# Patient Record
Sex: Male | Born: 1950 | Race: White | Hispanic: No | Marital: Married | Smoking: Former smoker
Health system: Southern US, Community
[De-identification: ages and names within clinical notes are randomized; demographics above are authoritative.]

## PROBLEM LIST (undated history)

## (undated) DIAGNOSIS — G43909 Migraine, unspecified, not intractable, without status migrainosus: Secondary | ICD-10-CM

## (undated) DIAGNOSIS — F32A Depression, unspecified: Secondary | ICD-10-CM

## (undated) DIAGNOSIS — R0602 Shortness of breath: Secondary | ICD-10-CM

## (undated) DIAGNOSIS — I739 Peripheral vascular disease, unspecified: Secondary | ICD-10-CM

## (undated) DIAGNOSIS — G609 Hereditary and idiopathic neuropathy, unspecified: Secondary | ICD-10-CM

## (undated) DIAGNOSIS — E785 Hyperlipidemia, unspecified: Secondary | ICD-10-CM

## (undated) DIAGNOSIS — Z89519 Acquired absence of unspecified leg below knee: Secondary | ICD-10-CM

## (undated) DIAGNOSIS — K219 Gastro-esophageal reflux disease without esophagitis: Secondary | ICD-10-CM

## (undated) DIAGNOSIS — N281 Cyst of kidney, acquired: Secondary | ICD-10-CM

## (undated) DIAGNOSIS — J189 Pneumonia, unspecified organism: Secondary | ICD-10-CM

## (undated) DIAGNOSIS — Z992 Dependence on renal dialysis: Secondary | ICD-10-CM

## (undated) DIAGNOSIS — K5730 Diverticulosis of large intestine without perforation or abscess without bleeding: Secondary | ICD-10-CM

## (undated) DIAGNOSIS — I350 Nonrheumatic aortic (valve) stenosis: Secondary | ICD-10-CM

## (undated) DIAGNOSIS — K648 Other hemorrhoids: Secondary | ICD-10-CM

## (undated) DIAGNOSIS — I2510 Atherosclerotic heart disease of native coronary artery without angina pectoris: Secondary | ICD-10-CM

## (undated) DIAGNOSIS — I5022 Chronic systolic (congestive) heart failure: Secondary | ICD-10-CM

## (undated) DIAGNOSIS — M62830 Muscle spasm of back: Secondary | ICD-10-CM

## (undated) DIAGNOSIS — M109 Gout, unspecified: Secondary | ICD-10-CM

## (undated) DIAGNOSIS — N186 End stage renal disease: Secondary | ICD-10-CM

## (undated) DIAGNOSIS — I255 Ischemic cardiomyopathy: Secondary | ICD-10-CM

## (undated) DIAGNOSIS — I4892 Unspecified atrial flutter: Secondary | ICD-10-CM

## (undated) DIAGNOSIS — I471 Supraventricular tachycardia: Secondary | ICD-10-CM

## (undated) DIAGNOSIS — F329 Major depressive disorder, single episode, unspecified: Secondary | ICD-10-CM

## (undated) DIAGNOSIS — L02212 Cutaneous abscess of back [any part, except buttock]: Secondary | ICD-10-CM

## (undated) DIAGNOSIS — K3184 Gastroparesis: Secondary | ICD-10-CM

## (undated) DIAGNOSIS — J449 Chronic obstructive pulmonary disease, unspecified: Secondary | ICD-10-CM

## (undated) DIAGNOSIS — I4891 Unspecified atrial fibrillation: Secondary | ICD-10-CM

## (undated) DIAGNOSIS — IMO0002 Reserved for concepts with insufficient information to code with codable children: Secondary | ICD-10-CM

## (undated) DIAGNOSIS — D126 Benign neoplasm of colon, unspecified: Secondary | ICD-10-CM

## (undated) DIAGNOSIS — I441 Atrioventricular block, second degree: Secondary | ICD-10-CM

## (undated) DIAGNOSIS — C4491 Basal cell carcinoma of skin, unspecified: Secondary | ICD-10-CM

## (undated) DIAGNOSIS — Z87442 Personal history of urinary calculi: Secondary | ICD-10-CM

## (undated) DIAGNOSIS — E119 Type 2 diabetes mellitus without complications: Secondary | ICD-10-CM

## (undated) DIAGNOSIS — F528 Other sexual dysfunction not due to a substance or known physiological condition: Secondary | ICD-10-CM

## (undated) DIAGNOSIS — I10 Essential (primary) hypertension: Secondary | ICD-10-CM

## (undated) DIAGNOSIS — G709 Myoneural disorder, unspecified: Secondary | ICD-10-CM

## (undated) HISTORY — DX: Cyst of kidney, acquired: N28.1

## (undated) HISTORY — DX: Gastro-esophageal reflux disease without esophagitis: K21.9

## (undated) HISTORY — PX: SKIN GRAFT: SHX250

## (undated) HISTORY — DX: Cutaneous abscess of back (any part, except buttock): L02.212

## (undated) HISTORY — DX: Dependence on renal dialysis: Z99.2

## (undated) HISTORY — DX: Myoneural disorder, unspecified: G70.9

## (undated) HISTORY — DX: Essential (primary) hypertension: I10

## (undated) HISTORY — DX: Atrioventricular block, second degree: I44.1

## (undated) HISTORY — PX: COLONOSCOPY: SHX174

## (undated) HISTORY — DX: Unspecified atrial flutter: I48.92

## (undated) HISTORY — PX: FRACTURE SURGERY: SHX138

## (undated) HISTORY — PX: ATRIAL ABLATION SURGERY: SHX560

## (undated) HISTORY — DX: Unspecified atrial fibrillation: I48.91

## (undated) HISTORY — DX: Gout, unspecified: M10.9

## (undated) HISTORY — PX: VASECTOMY: SHX75

## (undated) HISTORY — DX: End stage renal disease: N18.6

## (undated) HISTORY — DX: Hereditary and idiopathic neuropathy, unspecified: G60.9

## (undated) HISTORY — DX: Gastroparesis: K31.84

## (undated) HISTORY — DX: Atherosclerotic heart disease of native coronary artery without angina pectoris: I25.10

## (undated) HISTORY — DX: Supraventricular tachycardia: I47.1

## (undated) HISTORY — DX: Benign neoplasm of colon, unspecified: D12.6

## (undated) HISTORY — DX: Diverticulosis of large intestine without perforation or abscess without bleeding: K57.30

## (undated) HISTORY — DX: Chronic obstructive pulmonary disease, unspecified: J44.9

## (undated) HISTORY — DX: Hyperlipidemia, unspecified: E78.5

## (undated) HISTORY — DX: Other sexual dysfunction not due to a substance or known physiological condition: F52.8

## (undated) HISTORY — PX: TONSILLECTOMY: SUR1361

## (undated) HISTORY — PX: CATARACT EXTRACTION: SUR2

## (undated) HISTORY — DX: Personal history of urinary calculi: Z87.442

## (undated) HISTORY — DX: Other hemorrhoids: K64.8

## (undated) HISTORY — DX: Muscle spasm of back: M62.830

## (undated) HISTORY — DX: Chronic systolic (congestive) heart failure: I50.22

## (undated) HISTORY — DX: Reserved for concepts with insufficient information to code with codable children: IMO0002

---
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
## 1972-06-14 HISTORY — PX: ELBOW FRACTURE SURGERY: SHX616

## 1998-08-10 ENCOUNTER — Encounter: Payer: Self-pay | Admitting: *Deleted

## 1998-08-10 ENCOUNTER — Emergency Department (HOSPITAL_COMMUNITY): Admission: EM | Admit: 1998-08-10 | Discharge: 1998-08-10 | Payer: Self-pay | Admitting: *Deleted

## 2000-10-31 ENCOUNTER — Encounter: Admission: RE | Admit: 2000-10-31 | Discharge: 2001-01-29 | Payer: Self-pay | Admitting: Endocrinology

## 2000-12-14 ENCOUNTER — Encounter: Payer: Self-pay | Admitting: Family Medicine

## 2000-12-14 ENCOUNTER — Encounter (INDEPENDENT_AMBULATORY_CARE_PROVIDER_SITE_OTHER): Payer: Self-pay | Admitting: *Deleted

## 2000-12-14 ENCOUNTER — Encounter: Admission: RE | Admit: 2000-12-14 | Discharge: 2000-12-14 | Payer: Self-pay | Admitting: Family Medicine

## 2001-02-01 ENCOUNTER — Ambulatory Visit (HOSPITAL_COMMUNITY): Admission: RE | Admit: 2001-02-01 | Discharge: 2001-02-01 | Payer: Self-pay | Admitting: Gastroenterology

## 2001-02-01 ENCOUNTER — Encounter: Payer: Self-pay | Admitting: Gastroenterology

## 2001-02-01 ENCOUNTER — Encounter (INDEPENDENT_AMBULATORY_CARE_PROVIDER_SITE_OTHER): Payer: Self-pay | Admitting: Specialist

## 2001-02-06 ENCOUNTER — Ambulatory Visit (HOSPITAL_COMMUNITY): Admission: RE | Admit: 2001-02-06 | Discharge: 2001-02-06 | Payer: Self-pay | Admitting: Gastroenterology

## 2001-02-06 ENCOUNTER — Encounter: Payer: Self-pay | Admitting: Gastroenterology

## 2001-02-17 ENCOUNTER — Encounter: Payer: Self-pay | Admitting: Gastroenterology

## 2001-02-17 ENCOUNTER — Ambulatory Visit (HOSPITAL_COMMUNITY): Admission: RE | Admit: 2001-02-17 | Discharge: 2001-02-17 | Payer: Self-pay | Admitting: Gastroenterology

## 2003-06-11 ENCOUNTER — Emergency Department (HOSPITAL_COMMUNITY): Admission: EM | Admit: 2003-06-11 | Discharge: 2003-06-11 | Payer: Self-pay | Admitting: Emergency Medicine

## 2004-02-25 ENCOUNTER — Encounter (HOSPITAL_BASED_OUTPATIENT_CLINIC_OR_DEPARTMENT_OTHER): Admission: RE | Admit: 2004-02-25 | Discharge: 2004-03-10 | Payer: Self-pay | Admitting: Internal Medicine

## 2006-03-28 ENCOUNTER — Ambulatory Visit: Payer: Self-pay | Admitting: Internal Medicine

## 2006-04-20 ENCOUNTER — Ambulatory Visit: Payer: Self-pay | Admitting: Internal Medicine

## 2006-04-27 ENCOUNTER — Ambulatory Visit: Payer: Self-pay | Admitting: Internal Medicine

## 2006-04-27 ENCOUNTER — Ambulatory Visit (HOSPITAL_COMMUNITY): Admission: RE | Admit: 2006-04-27 | Discharge: 2006-04-28 | Payer: Self-pay | Admitting: Internal Medicine

## 2006-06-01 ENCOUNTER — Ambulatory Visit: Payer: Self-pay | Admitting: Internal Medicine

## 2006-09-20 ENCOUNTER — Encounter: Admission: RE | Admit: 2006-09-20 | Discharge: 2006-12-19 | Payer: Self-pay | Admitting: Endocrinology

## 2007-01-31 ENCOUNTER — Ambulatory Visit: Payer: Self-pay | Admitting: Internal Medicine

## 2007-01-31 ENCOUNTER — Inpatient Hospital Stay (HOSPITAL_COMMUNITY): Admission: AD | Admit: 2007-01-31 | Discharge: 2007-02-02 | Payer: Self-pay | Admitting: Internal Medicine

## 2007-02-01 ENCOUNTER — Encounter: Payer: Self-pay | Admitting: Internal Medicine

## 2007-02-06 ENCOUNTER — Ambulatory Visit: Payer: Self-pay | Admitting: Cardiology

## 2007-02-07 ENCOUNTER — Encounter (INDEPENDENT_AMBULATORY_CARE_PROVIDER_SITE_OTHER): Payer: Self-pay | Admitting: *Deleted

## 2007-02-07 ENCOUNTER — Ambulatory Visit (HOSPITAL_COMMUNITY): Admission: RE | Admit: 2007-02-07 | Discharge: 2007-02-07 | Payer: Self-pay | Admitting: *Deleted

## 2007-02-07 ENCOUNTER — Ambulatory Visit: Payer: Self-pay | Admitting: Cardiology

## 2007-02-07 LAB — CONVERTED CEMR LAB
Ketones, ur: NEGATIVE mg/dL
Mucus, UA: NEGATIVE
Nitrite: POSITIVE — AB
Specific Gravity, Urine: 1.025 (ref 1.000–1.03)
Squamous Epithelial / HPF: NEGATIVE /lpf
Total Protein, Urine: 100 mg/dL — AB
Urine Glucose: >=1000 mg/dL — CR
Urobilinogen, UA: 0.2 (ref 0.0–1.0)
pH: 5 (ref 5.0–8.0)

## 2007-02-08 ENCOUNTER — Ambulatory Visit: Payer: Self-pay | Admitting: Cardiology

## 2007-02-16 ENCOUNTER — Ambulatory Visit: Payer: Self-pay | Admitting: Cardiology

## 2007-02-23 ENCOUNTER — Ambulatory Visit: Payer: Self-pay | Admitting: Cardiology

## 2007-03-02 ENCOUNTER — Ambulatory Visit: Payer: Self-pay | Admitting: Cardiology

## 2007-03-13 ENCOUNTER — Ambulatory Visit: Payer: Self-pay | Admitting: Internal Medicine

## 2007-03-13 LAB — CONVERTED CEMR LAB
BUN: 23 mg/dL (ref 6–23)
Basophils Relative: 2 % — ABNORMAL HIGH (ref 0.0–1.0)
CO2: 25 meq/L (ref 19–32)
Calcium: 9.8 mg/dL (ref 8.4–10.5)
Chloride: 104 meq/L (ref 96–112)
Creatinine, Ser: 1.3 mg/dL (ref 0.4–1.5)
Eosinophils Relative: 4 % (ref 0.0–5.0)
GFR calc Af Amer: 73 mL/min
GFR calc non Af Amer: 61 mL/min
Glucose, Bld: 289 mg/dL — ABNORMAL HIGH (ref 70–99)
HCT: 35.6 % — ABNORMAL LOW (ref 39.0–52.0)
Hemoglobin: 12.7 g/dL — ABNORMAL LOW (ref 13.0–17.0)
INR: 2.3 — ABNORMAL HIGH (ref 0.8–1.0)
Lymphocytes Relative: 38 % (ref 12.0–46.0)
MCHC: 35.5 g/dL (ref 30.0–36.0)
MCV: 85.6 fL (ref 78.0–100.0)
Monocytes Relative: 4 % (ref 3.0–11.0)
Neutrophils Relative %: 50 % (ref 43.0–77.0)
Platelets: 404 10*3/uL — ABNORMAL HIGH (ref 150–400)
Potassium: 4.7 meq/L (ref 3.5–5.1)
Prothrombin Time: 19.2 s — ABNORMAL HIGH (ref 10.9–13.3)
RBC: 4.16 M/uL — ABNORMAL LOW (ref 4.22–5.81)
RDW: 14.2 % (ref 11.5–14.6)
Sodium: 138 meq/L (ref 135–145)
WBC: 9.2 10*3/uL (ref 4.5–10.5)
aPTT: 41.3 s — ABNORMAL HIGH (ref 21.7–29.8)

## 2007-03-17 ENCOUNTER — Ambulatory Visit: Payer: Self-pay | Admitting: Internal Medicine

## 2007-03-17 ENCOUNTER — Ambulatory Visit: Admission: RE | Admit: 2007-03-17 | Discharge: 2007-03-17 | Payer: Self-pay | Admitting: Internal Medicine

## 2007-03-29 ENCOUNTER — Ambulatory Visit: Payer: Self-pay | Admitting: Cardiovascular Disease

## 2007-04-06 ENCOUNTER — Ambulatory Visit: Payer: Self-pay | Admitting: Cardiology

## 2007-04-13 ENCOUNTER — Ambulatory Visit: Payer: Self-pay | Admitting: Internal Medicine

## 2007-04-21 ENCOUNTER — Ambulatory Visit: Payer: Self-pay | Admitting: Cardiology

## 2007-04-25 ENCOUNTER — Encounter: Payer: Self-pay | Admitting: Gastroenterology

## 2007-04-25 ENCOUNTER — Encounter: Admission: RE | Admit: 2007-04-25 | Discharge: 2007-04-25 | Payer: Self-pay | Admitting: Endocrinology

## 2007-04-27 ENCOUNTER — Ambulatory Visit: Payer: Self-pay | Admitting: Cardiology

## 2007-05-05 ENCOUNTER — Ambulatory Visit: Payer: Self-pay | Admitting: Internal Medicine

## 2007-05-15 ENCOUNTER — Ambulatory Visit: Payer: Self-pay | Admitting: Internal Medicine

## 2007-05-15 LAB — CONVERTED CEMR LAB
BUN: 30 mg/dL — ABNORMAL HIGH (ref 6–23)
Basophils Relative: 0 % (ref 0.0–1.0)
CO2: 23 meq/L (ref 19–32)
Calcium: 9.3 mg/dL (ref 8.4–10.5)
Chloride: 100 meq/L (ref 96–112)
Creatinine, Ser: 1.1 mg/dL (ref 0.4–1.5)
Eosinophils Relative: 6 % — ABNORMAL HIGH (ref 0.0–5.0)
GFR calc Af Amer: 89 mL/min
GFR calc non Af Amer: 74 mL/min
Glucose, Bld: 259 mg/dL — ABNORMAL HIGH (ref 70–99)
HCT: 34.1 % — ABNORMAL LOW (ref 39.0–52.0)
Hemoglobin: 14 g/dL (ref 13.0–17.0)
Lymphocytes Relative: 32 % (ref 12.0–46.0)
MCHC: 41 g/dL (ref 30.0–36.0)
MCV: 84.7 fL (ref 78.0–100.0)
Monocytes Relative: 4 % (ref 3.0–11.0)
Neutrophils Relative %: 58 % (ref 43.0–77.0)
Platelets: 482 10*3/uL — ABNORMAL HIGH (ref 150–400)
Potassium: 4.9 meq/L (ref 3.5–5.1)
RBC: 4.02 M/uL — ABNORMAL LOW (ref 4.22–5.81)
RDW: 13.7 % (ref 11.5–14.6)
Sodium: 131 meq/L — ABNORMAL LOW (ref 135–145)
WBC: 11.1 10*3/uL — ABNORMAL HIGH (ref 4.5–10.5)

## 2007-05-18 ENCOUNTER — Encounter: Payer: Self-pay | Admitting: Internal Medicine

## 2007-05-18 ENCOUNTER — Ambulatory Visit (HOSPITAL_COMMUNITY): Admission: RE | Admit: 2007-05-18 | Discharge: 2007-05-19 | Payer: Self-pay | Admitting: Internal Medicine

## 2007-05-18 ENCOUNTER — Ambulatory Visit: Payer: Self-pay | Admitting: Internal Medicine

## 2007-05-25 ENCOUNTER — Ambulatory Visit: Payer: Self-pay | Admitting: Cardiology

## 2007-06-16 ENCOUNTER — Ambulatory Visit: Payer: Self-pay | Admitting: Cardiology

## 2007-06-19 ENCOUNTER — Ambulatory Visit: Payer: Self-pay | Admitting: Internal Medicine

## 2007-06-19 LAB — CONVERTED CEMR LAB
BUN: 22 mg/dL (ref 6–23)
CO2: 23 meq/L (ref 19–32)
Calcium: 9.5 mg/dL (ref 8.4–10.5)
Chloride: 99 meq/L (ref 96–112)
Creatinine, Ser: 1.2 mg/dL (ref 0.4–1.5)
GFR calc Af Amer: 81 mL/min
GFR calc non Af Amer: 67 mL/min
Glucose, Bld: 363 mg/dL — ABNORMAL HIGH (ref 70–99)
Potassium: 5 meq/L (ref 3.5–5.1)
Pro B Natriuretic peptide (BNP): 55 pg/mL (ref 0.0–100.0)
Sodium: 131 meq/L — ABNORMAL LOW (ref 135–145)

## 2007-06-22 ENCOUNTER — Ambulatory Visit: Payer: Self-pay | Admitting: Internal Medicine

## 2007-06-23 ENCOUNTER — Ambulatory Visit: Payer: Self-pay | Admitting: Cardiology

## 2007-06-23 ENCOUNTER — Inpatient Hospital Stay (HOSPITAL_BASED_OUTPATIENT_CLINIC_OR_DEPARTMENT_OTHER): Admission: RE | Admit: 2007-06-23 | Discharge: 2007-06-23 | Payer: Self-pay | Admitting: Cardiovascular Disease

## 2007-07-20 ENCOUNTER — Encounter: Admission: RE | Admit: 2007-07-20 | Discharge: 2007-07-20 | Payer: Self-pay | Admitting: Endocrinology

## 2007-08-08 ENCOUNTER — Ambulatory Visit: Payer: Self-pay | Admitting: Cardiology

## 2007-08-17 ENCOUNTER — Ambulatory Visit: Payer: Self-pay | Admitting: Cardiovascular Disease

## 2007-09-21 ENCOUNTER — Ambulatory Visit: Payer: Self-pay | Admitting: Internal Medicine

## 2007-10-12 ENCOUNTER — Ambulatory Visit: Payer: Self-pay | Admitting: Cardiology

## 2007-11-08 ENCOUNTER — Ambulatory Visit: Payer: Self-pay | Admitting: Cardiology

## 2007-11-27 ENCOUNTER — Ambulatory Visit: Payer: Self-pay | Admitting: Internal Medicine

## 2007-12-04 ENCOUNTER — Ambulatory Visit: Payer: Self-pay | Admitting: Internal Medicine

## 2008-01-03 ENCOUNTER — Ambulatory Visit: Payer: Self-pay | Admitting: Cardiology

## 2008-01-24 ENCOUNTER — Encounter: Payer: Self-pay | Admitting: Internal Medicine

## 2008-01-25 ENCOUNTER — Ambulatory Visit: Payer: Self-pay | Admitting: Internal Medicine

## 2008-05-14 ENCOUNTER — Encounter: Admission: RE | Admit: 2008-05-14 | Discharge: 2008-05-14 | Payer: Self-pay | Admitting: Family Medicine

## 2008-05-14 ENCOUNTER — Encounter: Payer: Self-pay | Admitting: Internal Medicine

## 2008-05-15 ENCOUNTER — Ambulatory Visit: Payer: Self-pay | Admitting: Internal Medicine

## 2008-06-14 HISTORY — PX: AV FISTULA PLACEMENT: SHX1204

## 2008-06-14 LAB — HM COLONOSCOPY: HM Colonoscopy: NORMAL

## 2008-06-17 ENCOUNTER — Ambulatory Visit: Payer: Self-pay | Admitting: Internal Medicine

## 2008-06-24 ENCOUNTER — Encounter: Payer: Self-pay | Admitting: Internal Medicine

## 2008-07-03 ENCOUNTER — Encounter: Payer: Self-pay | Admitting: Internal Medicine

## 2008-07-15 ENCOUNTER — Ambulatory Visit: Payer: Self-pay | Admitting: Cardiology

## 2008-07-30 ENCOUNTER — Encounter: Payer: Self-pay | Admitting: Internal Medicine

## 2008-08-01 ENCOUNTER — Ambulatory Visit: Payer: Self-pay | Admitting: Internal Medicine

## 2008-08-01 DIAGNOSIS — I2510 Atherosclerotic heart disease of native coronary artery without angina pectoris: Secondary | ICD-10-CM | POA: Insufficient documentation

## 2008-08-01 DIAGNOSIS — K219 Gastro-esophageal reflux disease without esophagitis: Secondary | ICD-10-CM

## 2008-08-01 DIAGNOSIS — K3184 Gastroparesis: Secondary | ICD-10-CM

## 2008-08-01 DIAGNOSIS — I10 Essential (primary) hypertension: Secondary | ICD-10-CM

## 2008-08-01 DIAGNOSIS — M109 Gout, unspecified: Secondary | ICD-10-CM

## 2008-08-01 DIAGNOSIS — Z992 Dependence on renal dialysis: Secondary | ICD-10-CM

## 2008-08-01 DIAGNOSIS — E785 Hyperlipidemia, unspecified: Secondary | ICD-10-CM | POA: Insufficient documentation

## 2008-08-01 DIAGNOSIS — N186 End stage renal disease: Secondary | ICD-10-CM

## 2008-08-01 DIAGNOSIS — F528 Other sexual dysfunction not due to a substance or known physiological condition: Secondary | ICD-10-CM

## 2008-08-01 DIAGNOSIS — E118 Type 2 diabetes mellitus with unspecified complications: Secondary | ICD-10-CM | POA: Insufficient documentation

## 2008-08-01 DIAGNOSIS — G609 Hereditary and idiopathic neuropathy, unspecified: Secondary | ICD-10-CM | POA: Insufficient documentation

## 2008-08-01 DIAGNOSIS — Z87442 Personal history of urinary calculi: Secondary | ICD-10-CM

## 2008-08-01 HISTORY — DX: Atherosclerotic heart disease of native coronary artery without angina pectoris: I25.10

## 2008-08-01 HISTORY — DX: Gastroparesis: K31.84

## 2008-08-01 HISTORY — DX: Other sexual dysfunction not due to a substance or known physiological condition: F52.8

## 2008-08-01 HISTORY — DX: End stage renal disease: Z99.2

## 2008-08-01 HISTORY — DX: Gout, unspecified: M10.9

## 2008-08-01 HISTORY — DX: End stage renal disease: N18.6

## 2008-08-01 HISTORY — DX: Hereditary and idiopathic neuropathy, unspecified: G60.9

## 2008-08-01 HISTORY — DX: Personal history of urinary calculi: Z87.442

## 2008-08-02 ENCOUNTER — Encounter: Admission: RE | Admit: 2008-08-02 | Discharge: 2008-08-02 | Payer: Self-pay | Admitting: Nephrology

## 2008-08-04 ENCOUNTER — Encounter: Payer: Self-pay | Admitting: Internal Medicine

## 2008-08-04 DIAGNOSIS — R3129 Other microscopic hematuria: Secondary | ICD-10-CM

## 2008-08-05 LAB — CONVERTED CEMR LAB
ALT: 16 units/L (ref 0–53)
AST: 18 units/L (ref 0–37)
Albumin: 3.1 g/dL — ABNORMAL LOW (ref 3.5–5.2)
Alkaline Phosphatase: 54 units/L (ref 39–117)
BUN: 31 mg/dL — ABNORMAL HIGH (ref 6–23)
Bacteria, UA: NEGATIVE
Basophils Absolute: 0.1 10*3/uL (ref 0.0–0.1)
Basophils Relative: 1.3 % (ref 0.0–3.0)
Bilirubin Urine: NEGATIVE
Bilirubin, Direct: 0.1 mg/dL (ref 0.0–0.3)
CO2: 29 meq/L (ref 19–32)
Calcium: 9.4 mg/dL (ref 8.4–10.5)
Chloride: 106 meq/L (ref 96–112)
Cholesterol: 203 mg/dL (ref 0–200)
Creatinine, Ser: 2.6 mg/dL — ABNORMAL HIGH (ref 0.4–1.5)
Creatinine,U: 116.6 mg/dL
Crystals: NEGATIVE
Direct LDL: 23.6 mg/dL
Eosinophils Absolute: 0.4 10*3/uL (ref 0.0–0.7)
Eosinophils Relative: 3.9 % (ref 0.0–5.0)
Glucose, Bld: 200 mg/dL — ABNORMAL HIGH (ref 70–99)
HCT: 38.7 % — ABNORMAL LOW (ref 39.0–52.0)
HDL: 33.2 mg/dL — ABNORMAL LOW (ref >39.0)
Hemoglobin: 12.7 g/dL — ABNORMAL LOW (ref 13.0–17.0)
Hgb A1c MFr Bld: 8.9 % — ABNORMAL HIGH (ref 4.6–6.0)
Ketones, ur: NEGATIVE mg/dL
Leukocytes, UA: NEGATIVE
Lymphocytes Relative: 29.8 % (ref 12.0–46.0)
MCHC: 33 g/dL (ref 30.0–36.0)
MCV: 82.3 fL (ref 78.0–100.0)
Microalb, Ur: 724.6 mg/dL — ABNORMAL HIGH (ref 0.0–1.9)
Monocytes Absolute: 0.6 10*3/uL (ref 0.1–1.0)
Monocytes Relative: 5.8 % (ref 3.0–12.0)
Neutro Abs: 6.6 10*3/uL (ref 1.4–7.7)
Neutrophils Relative %: 59.2 % (ref 43.0–77.0)
Nitrite: NEGATIVE
PSA: 0.68 ng/mL (ref 0.10–4.00)
Platelets: 453 10*3/uL — ABNORMAL HIGH (ref 150–400)
Potassium: 4.5 meq/L (ref 3.5–5.1)
RBC: 4.7 M/uL (ref 4.22–5.81)
RDW: 13.6 % (ref 11.5–14.6)
Sodium: 141 meq/L (ref 135–145)
Specific Gravity, Urine: 1.02 (ref 1.000–1.03)
Squamous Epithelial / HPF: NEGATIVE /lpf
TSH: 0.9 microintl units/mL (ref 0.35–5.50)
Total Bilirubin: 0.7 mg/dL (ref 0.3–1.2)
Total CHOL/HDL Ratio: 6.1
Total Protein, Urine: >=300 mg/dL — AB
Total Protein: 6.7 g/dL (ref 6.0–8.3)
Triglycerides: 1139 mg/dL (ref 0–149)
Urine Glucose: 500 mg/dL — AB
Urobilinogen, UA: 0.2 (ref 0.0–1.0)
WBC: 10.9 10*3/uL — ABNORMAL HIGH (ref 4.5–10.5)
pH: 6 (ref 5.0–8.0)

## 2008-08-06 LAB — CONVERTED CEMR LAB: Vit D, 25-Hydroxy: <4 ng/mL — ABNORMAL LOW (ref 30–89)

## 2008-08-19 ENCOUNTER — Ambulatory Visit: Payer: Self-pay | Admitting: Cardiology

## 2008-08-19 ENCOUNTER — Ambulatory Visit: Payer: Self-pay | Admitting: Endocrinology

## 2008-08-19 DIAGNOSIS — M1460 Charcot's joint, unspecified site: Secondary | ICD-10-CM | POA: Insufficient documentation

## 2008-08-21 ENCOUNTER — Encounter: Payer: Self-pay | Admitting: Internal Medicine

## 2008-08-22 ENCOUNTER — Ambulatory Visit (HOSPITAL_COMMUNITY): Admission: RE | Admit: 2008-08-22 | Discharge: 2008-08-23 | Payer: Self-pay | Admitting: Orthopedic Surgery

## 2008-09-11 ENCOUNTER — Encounter (INDEPENDENT_AMBULATORY_CARE_PROVIDER_SITE_OTHER): Payer: Self-pay | Admitting: *Deleted

## 2008-09-26 ENCOUNTER — Telehealth: Payer: Self-pay | Admitting: Endocrinology

## 2008-10-08 ENCOUNTER — Ambulatory Visit: Payer: Self-pay | Admitting: Endocrinology

## 2008-10-22 ENCOUNTER — Ambulatory Visit: Payer: Self-pay | Admitting: Endocrinology

## 2008-11-05 ENCOUNTER — Ambulatory Visit: Payer: Self-pay | Admitting: Endocrinology

## 2008-11-12 ENCOUNTER — Encounter: Payer: Self-pay | Admitting: *Deleted

## 2008-11-12 ENCOUNTER — Encounter: Payer: Self-pay | Admitting: Internal Medicine

## 2008-11-13 ENCOUNTER — Encounter: Payer: Self-pay | Admitting: Internal Medicine

## 2008-11-19 ENCOUNTER — Ambulatory Visit: Payer: Self-pay | Admitting: Endocrinology

## 2008-12-18 ENCOUNTER — Ambulatory Visit: Payer: Self-pay | Admitting: Endocrinology

## 2008-12-18 ENCOUNTER — Encounter: Payer: Self-pay | Admitting: *Deleted

## 2009-01-07 ENCOUNTER — Ambulatory Visit: Payer: Self-pay | Admitting: Internal Medicine

## 2009-01-07 LAB — CONVERTED CEMR LAB
POC INR: 2.2
Prothrombin Time: 18.2 s

## 2009-01-28 ENCOUNTER — Encounter: Payer: Self-pay | Admitting: Internal Medicine

## 2009-02-03 ENCOUNTER — Telehealth: Payer: Self-pay | Admitting: Internal Medicine

## 2009-02-04 ENCOUNTER — Ambulatory Visit: Payer: Self-pay | Admitting: Internal Medicine

## 2009-02-04 LAB — CONVERTED CEMR LAB: POC INR: 1.1

## 2009-02-25 ENCOUNTER — Ambulatory Visit: Payer: Self-pay | Admitting: Endocrinology

## 2009-02-25 LAB — CONVERTED CEMR LAB: Hgb A1c MFr Bld: 10.2 % — ABNORMAL HIGH (ref 4.6–6.5)

## 2009-02-28 ENCOUNTER — Telehealth: Payer: Self-pay | Admitting: Internal Medicine

## 2009-03-13 ENCOUNTER — Encounter (INDEPENDENT_AMBULATORY_CARE_PROVIDER_SITE_OTHER): Payer: Self-pay | Admitting: Pharmacist

## 2009-03-25 ENCOUNTER — Ambulatory Visit: Payer: Self-pay | Admitting: Endocrinology

## 2009-04-01 ENCOUNTER — Ambulatory Visit: Payer: Self-pay | Admitting: Endocrinology

## 2009-04-02 ENCOUNTER — Encounter (INDEPENDENT_AMBULATORY_CARE_PROVIDER_SITE_OTHER): Payer: Self-pay | Admitting: Cardiology

## 2009-04-08 ENCOUNTER — Ambulatory Visit: Payer: Self-pay | Admitting: Cardiovascular Disease

## 2009-04-08 LAB — CONVERTED CEMR LAB: POC INR: 1.5

## 2009-04-22 ENCOUNTER — Ambulatory Visit: Payer: Self-pay | Admitting: Cardiology

## 2009-05-01 ENCOUNTER — Encounter: Payer: Self-pay | Admitting: Internal Medicine

## 2009-05-12 ENCOUNTER — Encounter: Admission: RE | Admit: 2009-05-12 | Discharge: 2009-05-12 | Payer: Self-pay | Admitting: Nephrology

## 2009-05-13 ENCOUNTER — Encounter (INDEPENDENT_AMBULATORY_CARE_PROVIDER_SITE_OTHER): Payer: Self-pay | Admitting: Cardiology

## 2009-05-13 ENCOUNTER — Ambulatory Visit: Payer: Self-pay | Admitting: Cardiology

## 2009-06-03 ENCOUNTER — Ambulatory Visit: Payer: Self-pay | Admitting: Endocrinology

## 2009-06-05 ENCOUNTER — Ambulatory Visit: Payer: Self-pay | Admitting: Cardiology

## 2009-06-05 LAB — CONVERTED CEMR LAB: POC INR: 1.5

## 2009-06-11 ENCOUNTER — Telehealth: Payer: Self-pay | Admitting: Endocrinology

## 2009-06-12 ENCOUNTER — Telehealth: Payer: Self-pay | Admitting: Endocrinology

## 2009-06-17 ENCOUNTER — Ambulatory Visit: Payer: Self-pay | Admitting: Cardiology

## 2009-07-07 ENCOUNTER — Telehealth: Payer: Self-pay | Admitting: Internal Medicine

## 2009-07-07 ENCOUNTER — Ambulatory Visit: Payer: Self-pay | Admitting: Internal Medicine

## 2009-07-07 ENCOUNTER — Ambulatory Visit: Payer: Self-pay | Admitting: Cardiology

## 2009-07-07 ENCOUNTER — Encounter: Payer: Self-pay | Admitting: Internal Medicine

## 2009-07-07 DIAGNOSIS — I4892 Unspecified atrial flutter: Secondary | ICD-10-CM

## 2009-07-07 HISTORY — DX: Unspecified atrial flutter: I48.92

## 2009-07-07 LAB — CONVERTED CEMR LAB: POC INR: 3.4

## 2009-07-08 ENCOUNTER — Ambulatory Visit: Payer: Self-pay | Admitting: Internal Medicine

## 2009-07-08 ENCOUNTER — Inpatient Hospital Stay (HOSPITAL_COMMUNITY): Admission: RE | Admit: 2009-07-08 | Discharge: 2009-07-13 | Payer: Self-pay | Admitting: Internal Medicine

## 2009-07-08 LAB — CONVERTED CEMR LAB
Basophils Relative: 0.4 % (ref 0.0–3.0)
CO2: 23 meq/L (ref 19–32)
Calcium: 8.3 mg/dL — ABNORMAL LOW (ref 8.4–10.5)
Eosinophils Absolute: 0.4 10*3/uL (ref 0.0–0.7)
Hemoglobin: 11.9 g/dL — ABNORMAL LOW (ref 13.0–17.0)
Lymphocytes Relative: 30.9 % (ref 12.0–46.0)
MCHC: 33.4 g/dL (ref 30.0–36.0)
Monocytes Relative: 7 % (ref 3.0–12.0)
Neutro Abs: 7.5 10*3/uL (ref 1.4–7.7)
Potassium: 4.5 meq/L (ref 3.5–5.1)
RBC: 4.18 M/uL — ABNORMAL LOW (ref 4.22–5.81)
Sodium: 141 meq/L (ref 135–145)

## 2009-07-10 ENCOUNTER — Encounter: Payer: Self-pay | Admitting: Internal Medicine

## 2009-07-11 ENCOUNTER — Encounter: Payer: Self-pay | Admitting: Internal Medicine

## 2009-07-11 ENCOUNTER — Ambulatory Visit: Payer: Self-pay | Admitting: Vascular Surgery

## 2009-07-15 ENCOUNTER — Ambulatory Visit: Payer: Self-pay | Admitting: Internal Medicine

## 2009-07-15 ENCOUNTER — Encounter (INDEPENDENT_AMBULATORY_CARE_PROVIDER_SITE_OTHER): Payer: Self-pay | Admitting: Cardiology

## 2009-07-22 ENCOUNTER — Telehealth: Payer: Self-pay | Admitting: Endocrinology

## 2009-07-24 ENCOUNTER — Encounter (INDEPENDENT_AMBULATORY_CARE_PROVIDER_SITE_OTHER): Payer: Self-pay | Admitting: Cardiology

## 2009-07-24 ENCOUNTER — Ambulatory Visit: Payer: Self-pay | Admitting: Cardiology

## 2009-07-30 ENCOUNTER — Ambulatory Visit: Payer: Self-pay | Admitting: Internal Medicine

## 2009-07-30 ENCOUNTER — Ambulatory Visit: Payer: Self-pay | Admitting: Endocrinology

## 2009-07-30 DIAGNOSIS — I471 Supraventricular tachycardia, unspecified: Secondary | ICD-10-CM | POA: Insufficient documentation

## 2009-07-30 HISTORY — DX: Supraventricular tachycardia: I47.1

## 2009-07-30 HISTORY — DX: Supraventricular tachycardia, unspecified: I47.10

## 2009-08-06 ENCOUNTER — Ambulatory Visit: Payer: Self-pay | Admitting: Endocrinology

## 2009-08-13 ENCOUNTER — Ambulatory Visit (HOSPITAL_COMMUNITY): Admission: RE | Admit: 2009-08-13 | Discharge: 2009-08-13 | Payer: Self-pay | Admitting: Vascular Surgery

## 2009-08-13 ENCOUNTER — Ambulatory Visit: Payer: Self-pay | Admitting: Vascular Surgery

## 2009-08-18 ENCOUNTER — Encounter: Payer: Self-pay | Admitting: Internal Medicine

## 2009-08-20 ENCOUNTER — Ambulatory Visit: Payer: Self-pay | Admitting: Endocrinology

## 2009-08-26 ENCOUNTER — Ambulatory Visit: Payer: Self-pay | Admitting: Vascular Surgery

## 2009-09-12 HISTORY — PX: FOOT SURGERY: SHX648

## 2009-09-15 ENCOUNTER — Telehealth: Payer: Self-pay | Admitting: Endocrinology

## 2009-09-15 ENCOUNTER — Inpatient Hospital Stay (HOSPITAL_COMMUNITY): Admission: AD | Admit: 2009-09-15 | Discharge: 2009-09-22 | Payer: Self-pay | Admitting: Endocrinology

## 2009-09-15 ENCOUNTER — Ambulatory Visit: Payer: Self-pay | Admitting: Endocrinology

## 2009-09-15 DIAGNOSIS — L97909 Non-pressure chronic ulcer of unspecified part of unspecified lower leg with unspecified severity: Secondary | ICD-10-CM

## 2009-09-16 ENCOUNTER — Encounter: Payer: Self-pay | Admitting: Endocrinology

## 2009-09-18 ENCOUNTER — Encounter: Payer: Self-pay | Admitting: Endocrinology

## 2009-09-20 ENCOUNTER — Encounter: Payer: Self-pay | Admitting: Endocrinology

## 2009-09-22 ENCOUNTER — Encounter: Payer: Self-pay | Admitting: Endocrinology

## 2009-09-24 ENCOUNTER — Ambulatory Visit: Payer: Self-pay | Admitting: Internal Medicine

## 2009-09-24 DIAGNOSIS — I4891 Unspecified atrial fibrillation: Secondary | ICD-10-CM | POA: Insufficient documentation

## 2009-09-29 ENCOUNTER — Encounter: Payer: Self-pay | Admitting: Internal Medicine

## 2009-09-29 ENCOUNTER — Encounter: Payer: Self-pay | Admitting: Endocrinology

## 2009-09-30 ENCOUNTER — Encounter: Payer: Self-pay | Admitting: Internal Medicine

## 2009-10-02 ENCOUNTER — Telehealth: Payer: Self-pay | Admitting: Endocrinology

## 2009-10-02 ENCOUNTER — Encounter: Payer: Self-pay | Admitting: Cardiology

## 2009-10-02 LAB — CONVERTED CEMR LAB: Prothrombin Time: 22.2 s

## 2009-10-06 ENCOUNTER — Encounter: Payer: Self-pay | Admitting: Internal Medicine

## 2009-10-07 ENCOUNTER — Encounter: Payer: Self-pay | Admitting: Cardiovascular Disease

## 2009-10-07 LAB — CONVERTED CEMR LAB: POC INR: 1.81

## 2009-10-10 ENCOUNTER — Encounter: Payer: Self-pay | Admitting: Cardiology

## 2009-10-14 ENCOUNTER — Telehealth: Payer: Self-pay | Admitting: Adult Health

## 2009-10-14 ENCOUNTER — Encounter: Payer: Self-pay | Admitting: Cardiovascular Disease

## 2009-10-14 ENCOUNTER — Encounter: Payer: Self-pay | Admitting: Internal Medicine

## 2009-10-14 LAB — CONVERTED CEMR LAB
POC INR: 8.78
Prothrombin Time: 71.5 s

## 2009-10-15 ENCOUNTER — Encounter: Payer: Self-pay | Admitting: Internal Medicine

## 2009-10-17 ENCOUNTER — Encounter: Payer: Self-pay | Admitting: Internal Medicine

## 2009-10-17 ENCOUNTER — Encounter (INDEPENDENT_AMBULATORY_CARE_PROVIDER_SITE_OTHER): Payer: Self-pay | Admitting: Pharmacist

## 2009-10-20 ENCOUNTER — Encounter (HOSPITAL_COMMUNITY): Admission: RE | Admit: 2009-10-20 | Discharge: 2010-01-18 | Payer: Self-pay | Admitting: Nephrology

## 2009-10-20 ENCOUNTER — Encounter: Payer: Self-pay | Admitting: Internal Medicine

## 2009-10-22 ENCOUNTER — Encounter: Payer: Self-pay | Admitting: Internal Medicine

## 2009-10-22 ENCOUNTER — Ambulatory Visit: Payer: Self-pay | Admitting: Endocrinology

## 2009-10-23 ENCOUNTER — Encounter: Payer: Self-pay | Admitting: Internal Medicine

## 2009-10-27 ENCOUNTER — Encounter: Payer: Self-pay | Admitting: Cardiovascular Disease

## 2009-10-28 ENCOUNTER — Telehealth: Payer: Self-pay | Admitting: Endocrinology

## 2009-11-03 ENCOUNTER — Encounter: Payer: Self-pay | Admitting: Internal Medicine

## 2009-11-06 ENCOUNTER — Encounter: Payer: Self-pay | Admitting: Internal Medicine

## 2009-11-06 ENCOUNTER — Ambulatory Visit: Payer: Self-pay | Admitting: Cardiology

## 2009-11-06 LAB — CONVERTED CEMR LAB
POC INR: >8
Prothrombin Time: 99 s

## 2009-11-07 LAB — CONVERTED CEMR LAB
INR: 9.7 (ref 0.8–1.0)
Prothrombin Time: 99 s (ref 9.1–11.7)

## 2009-11-12 ENCOUNTER — Ambulatory Visit: Payer: Self-pay | Admitting: Internal Medicine

## 2009-11-12 ENCOUNTER — Ambulatory Visit: Payer: Self-pay | Admitting: Cardiology

## 2009-11-19 ENCOUNTER — Encounter: Payer: Self-pay | Admitting: Cardiology

## 2009-11-19 ENCOUNTER — Encounter (INDEPENDENT_AMBULATORY_CARE_PROVIDER_SITE_OTHER): Payer: Self-pay | Admitting: Pharmacist

## 2009-11-20 ENCOUNTER — Telehealth: Payer: Self-pay | Admitting: Internal Medicine

## 2009-11-24 ENCOUNTER — Ambulatory Visit: Payer: Self-pay | Admitting: Cardiology

## 2009-11-24 LAB — CONVERTED CEMR LAB: POC INR: 2.4

## 2009-11-25 ENCOUNTER — Telehealth: Payer: Self-pay | Admitting: Endocrinology

## 2009-12-01 ENCOUNTER — Ambulatory Visit: Payer: Self-pay | Admitting: Cardiology

## 2009-12-05 ENCOUNTER — Encounter: Payer: Self-pay | Admitting: Internal Medicine

## 2009-12-08 ENCOUNTER — Encounter: Payer: Self-pay | Admitting: Internal Medicine

## 2009-12-08 ENCOUNTER — Ambulatory Visit: Payer: Self-pay | Admitting: Cardiology

## 2009-12-08 LAB — CONVERTED CEMR LAB
INR: 6.7
POC INR: >8
Prothrombin Time: 72.2 s

## 2009-12-16 ENCOUNTER — Ambulatory Visit: Payer: Self-pay | Admitting: Internal Medicine

## 2009-12-19 ENCOUNTER — Encounter: Payer: Self-pay | Admitting: Internal Medicine

## 2009-12-29 ENCOUNTER — Ambulatory Visit: Payer: Self-pay | Admitting: Endocrinology

## 2009-12-29 ENCOUNTER — Ambulatory Visit: Payer: Self-pay | Admitting: Cardiology

## 2009-12-29 LAB — CONVERTED CEMR LAB
Hgb A1c MFr Bld: 7 % — ABNORMAL HIGH (ref 4.6–6.5)
POC INR: 1.9
Total CHOL/HDL Ratio: 3
VLDL: 22.2 mg/dL (ref 0.0–40.0)

## 2010-01-02 ENCOUNTER — Ambulatory Visit: Payer: Self-pay | Admitting: Internal Medicine

## 2010-01-02 ENCOUNTER — Encounter: Payer: Self-pay | Admitting: Internal Medicine

## 2010-01-06 ENCOUNTER — Telehealth (INDEPENDENT_AMBULATORY_CARE_PROVIDER_SITE_OTHER): Payer: Self-pay | Admitting: *Deleted

## 2010-01-07 ENCOUNTER — Encounter (HOSPITAL_COMMUNITY): Admission: RE | Admit: 2010-01-07 | Discharge: 2010-04-07 | Payer: Self-pay | Admitting: Internal Medicine

## 2010-01-07 ENCOUNTER — Ambulatory Visit: Payer: Self-pay

## 2010-01-07 ENCOUNTER — Encounter: Payer: Self-pay | Admitting: Cardiology

## 2010-01-07 ENCOUNTER — Encounter (INDEPENDENT_AMBULATORY_CARE_PROVIDER_SITE_OTHER): Payer: Self-pay | Admitting: *Deleted

## 2010-01-07 ENCOUNTER — Ambulatory Visit: Payer: Self-pay | Admitting: Cardiology

## 2010-01-08 ENCOUNTER — Telehealth: Payer: Self-pay | Admitting: Internal Medicine

## 2010-01-12 ENCOUNTER — Ambulatory Visit: Payer: Self-pay | Admitting: Cardiovascular Disease

## 2010-01-12 LAB — CONVERTED CEMR LAB: POC INR: 1.7

## 2010-01-26 ENCOUNTER — Ambulatory Visit: Payer: Self-pay | Admitting: Cardiology

## 2010-01-26 LAB — CONVERTED CEMR LAB: POC INR: 1.9

## 2010-01-30 ENCOUNTER — Telehealth: Payer: Self-pay | Admitting: Endocrinology

## 2010-02-03 ENCOUNTER — Telehealth: Payer: Self-pay | Admitting: Endocrinology

## 2010-02-04 ENCOUNTER — Encounter: Payer: Self-pay | Admitting: Internal Medicine

## 2010-02-09 ENCOUNTER — Ambulatory Visit: Payer: Self-pay | Admitting: Cardiology

## 2010-02-11 ENCOUNTER — Encounter: Payer: Self-pay | Admitting: Internal Medicine

## 2010-03-18 ENCOUNTER — Encounter: Payer: Self-pay | Admitting: Internal Medicine

## 2010-04-06 ENCOUNTER — Ambulatory Visit: Payer: Self-pay | Admitting: Endocrinology

## 2010-04-06 LAB — CONVERTED CEMR LAB
ALT: 15 units/L (ref 0–53)
AST: 24 units/L (ref 0–37)
Alkaline Phosphatase: 33 units/L — ABNORMAL LOW (ref 39–117)
BUN: 84 mg/dL — ABNORMAL HIGH (ref 6–23)
Basophils Absolute: 0.1 10*3/uL (ref 0.0–0.1)
Bilirubin Urine: NEGATIVE
Bilirubin, Direct: 0.2 mg/dL (ref 0.0–0.3)
Calcium: 9.4 mg/dL (ref 8.4–10.5)
Eosinophils Relative: 5.2 % — ABNORMAL HIGH (ref 0.0–5.0)
GFR calc non Af Amer: 8.95 mL/min (ref >60)
HCT: 32.1 % — ABNORMAL LOW (ref 39.0–52.0)
HDL: 24.7 mg/dL — ABNORMAL LOW (ref >39.00)
Hgb A1c MFr Bld: 9 % — ABNORMAL HIGH (ref 4.6–6.5)
Ketones, ur: NEGATIVE mg/dL
LDL Cholesterol: 43 mg/dL (ref 0–99)
Leukocytes, UA: NEGATIVE
Lymphocytes Relative: 16.9 % (ref 12.0–46.0)
Lymphs Abs: 2.1 10*3/uL (ref 0.7–4.0)
Monocytes Relative: 7 % (ref 3.0–12.0)
Neutrophils Relative %: 70.3 % (ref 43.0–77.0)
PSA: 0.7 ng/mL (ref 0.10–4.00)
Platelets: 321 10*3/uL (ref 150.0–400.0)
Potassium: 3.9 meq/L (ref 3.5–5.1)
Total Bilirubin: 0.9 mg/dL (ref 0.3–1.2)
Total CHOL/HDL Ratio: 4
Urine Glucose: 500 mg/dL
Urobilinogen, UA: 0.2 (ref 0.0–1.0)
VLDL: 23.4 mg/dL (ref 0.0–40.0)
WBC: 12.5 10*3/uL — ABNORMAL HIGH (ref 4.5–10.5)

## 2010-04-09 ENCOUNTER — Ambulatory Visit: Payer: Self-pay | Admitting: Endocrinology

## 2010-04-10 ENCOUNTER — Encounter: Payer: Self-pay | Admitting: Endocrinology

## 2010-04-10 LAB — CONVERTED CEMR LAB: Fructosamine: 415 micromoles/L — ABNORMAL HIGH (ref <285)

## 2010-04-16 ENCOUNTER — Encounter (INDEPENDENT_AMBULATORY_CARE_PROVIDER_SITE_OTHER): Payer: Self-pay | Admitting: *Deleted

## 2010-04-21 ENCOUNTER — Encounter (HOSPITAL_COMMUNITY)
Admission: RE | Admit: 2010-04-21 | Discharge: 2010-07-14 | Payer: Self-pay | Source: Home / Self Care | Attending: Nephrology | Admitting: Nephrology

## 2010-04-23 ENCOUNTER — Encounter: Payer: Self-pay | Admitting: Internal Medicine

## 2010-04-29 ENCOUNTER — Encounter: Payer: Self-pay | Admitting: Internal Medicine

## 2010-05-18 ENCOUNTER — Ambulatory Visit: Payer: Self-pay | Admitting: Internal Medicine

## 2010-05-21 ENCOUNTER — Encounter: Payer: Self-pay | Admitting: Internal Medicine

## 2010-06-02 ENCOUNTER — Encounter: Payer: Self-pay | Admitting: Internal Medicine

## 2010-06-05 ENCOUNTER — Encounter: Payer: Self-pay | Admitting: Internal Medicine

## 2010-06-12 ENCOUNTER — Encounter (INDEPENDENT_AMBULATORY_CARE_PROVIDER_SITE_OTHER): Payer: Self-pay | Admitting: *Deleted

## 2010-06-17 ENCOUNTER — Ambulatory Visit: Admit: 2010-06-17 | Payer: Self-pay | Admitting: Internal Medicine

## 2010-06-17 ENCOUNTER — Ambulatory Visit: Admit: 2010-06-17 | Payer: Self-pay

## 2010-06-18 ENCOUNTER — Encounter: Payer: Self-pay | Admitting: Internal Medicine

## 2010-06-19 ENCOUNTER — Encounter: Payer: Self-pay | Admitting: Internal Medicine

## 2010-06-19 ENCOUNTER — Ambulatory Visit
Admission: RE | Admit: 2010-06-19 | Discharge: 2010-06-19 | Payer: Self-pay | Source: Home / Self Care | Attending: Internal Medicine | Admitting: Internal Medicine

## 2010-06-21 DIAGNOSIS — I5022 Chronic systolic (congestive) heart failure: Secondary | ICD-10-CM

## 2010-06-21 HISTORY — DX: Chronic systolic (congestive) heart failure: I50.22

## 2010-06-29 LAB — POCT HEMOGLOBIN-HEMACUE: Hemoglobin: 10.7 g/dL — ABNORMAL LOW (ref 13.0–17.0)

## 2010-07-06 ENCOUNTER — Telehealth: Payer: Self-pay | Admitting: Endocrinology

## 2010-07-07 ENCOUNTER — Encounter (INDEPENDENT_AMBULATORY_CARE_PROVIDER_SITE_OTHER): Payer: Self-pay | Admitting: Pharmacist

## 2010-07-08 ENCOUNTER — Ambulatory Visit
Admission: RE | Admit: 2010-07-08 | Discharge: 2010-07-08 | Payer: Self-pay | Source: Home / Self Care | Attending: Endocrinology | Admitting: Endocrinology

## 2010-07-08 DIAGNOSIS — G8190 Hemiplegia, unspecified affecting unspecified side: Secondary | ICD-10-CM | POA: Insufficient documentation

## 2010-07-10 ENCOUNTER — Other Ambulatory Visit: Payer: Self-pay | Admitting: Endocrinology

## 2010-07-10 DIAGNOSIS — R531 Weakness: Secondary | ICD-10-CM

## 2010-07-10 LAB — HEMOGLOBIN A1C: Hgb A1c MFr Bld: 9.5 % — ABNORMAL HIGH (ref 4.6–6.5)

## 2010-07-10 LAB — VITAMIN B12: Vitamin B-12: 797 pg/mL (ref 211–911)

## 2010-07-12 LAB — CONVERTED CEMR LAB
BUN: 65 mg/dL — ABNORMAL HIGH (ref 6–23)
Chloride: 110 meq/L (ref 96–112)
Glucose, Bld: 108 mg/dL — ABNORMAL HIGH (ref 70–99)
Potassium: 4.9 meq/L (ref 3.5–5.1)

## 2010-07-14 NOTE — Medication Information (Signed)
Summary: Coumadin Clinic  Anticoagulant Therapy  Managed by: Bethena Midget, RN, BSN Referring MD: Sherryl Manges MD PCP: Oliver Barre Supervising MD: Myrtis Ser MD, Tinnie Gens Indication 1: Atrial Fibrillation (ICD-427.31) Lab Used: [facility] Site: [facility] 22.2 INR POC 1.81 INR RANGE 2.0-3.0  Dietary changes: no    Health status changes: no    Bleeding/hemorrhagic complications: no    Recent/future hospitalizations: no    Any changes in medication regimen? yes       Details: IV Vancomycin q 72hrs.   Recent/future dental: no  Any missed doses?: no       Is patient compliant with meds? yes      Comments: Dr. Johney Frame restarted coumadin on 09/24/09 with a dose of 5mg s daily.   Allergies: No Known Drug Allergies  Anticoagulation Management History:      His anticoagulation is being managed by telephone today.  Positive risk factors for bleeding include presence of serious comorbidities.  Negative risk factors for bleeding include an age less than 95 years old.  The bleeding index is 'intermediate risk'.  Positive CHADS2 values include History of HTN and History of Diabetes.  Negative CHADS2 values include Age > 16 years old.  The start date was 01/31/2007.  His last INR was 2.4.  Prothrombin time is 22.2.  Anticoagulation responsible provider: Myrtis Ser MD, Tinnie Gens.  INR POC: 1.81.    Anticoagulation Management Assessment/Plan:      The patient's current anticoagulation dose is Coumadin 5 mg tabs: take as directed.  The target INR is 2.0-3.0.  The next INR is due 10/06/2009.  Anticoagulation instructions were given to patient.  Results were reviewed/authorized by Bethena Midget, RN, BSN.  He was notified by Bethena Midget, RN, BSN.         Prior Anticoagulation Instructions: INR 2.0  Continue 1 tab daily except 0.5 tab each Tuesday.    Recheck in 3 weeks.    Current Anticoagulation Instructions: INR 1.8 Today take extra 2.5mg s then resume dose to 5mg s daily Recheck INR on Monday with  [facility] labs. Verbal order given to [facility] with [facility] @ [phone].

## 2010-07-14 NOTE — Assessment & Plan Note (Signed)
Summary: for wound/per triage/cd   Vital Signs:  Patient profile:   60 year old male Height:      73 inches (185.42 cm) Weight:      228.38 pounds (103.81 kg) O2 Sat:      97 % on Room air Temp:     99.0 degrees F (37.22 degrees C) oral Pulse rate:   101 / minute BP sitting:   152 / 60  (left arm) Cuff size:   large  Vitals Entered By: Josph Macho RMA (September 15, 2009 3:43 PM)  O2 Flow:  Room air CC: Wound on bottom of right foot X2weeks/ CF Is Patient Diabetic? Yes   Referring Provider:  Oliver Barre Primary Provider:  Oliver Barre  CC:  Wound on bottom of right foot X2weeks/ CF.  History of Present Illness: pt states 10 days of severe ulcer on the right foot, but no associated pain (has loss of sensation due to neuropathy). he says his cbg's have increased to the 200's the past few days. he can't have bp in the left arm  Current Medications (verified): 1)  Lovaza 1 Gm Caps (Omega-3-Acid Ethyl Esters) .... 2 Tab Bid 2)  Coumadin 5 Mg Tabs (Warfarin Sodium) .... Use As Directed By Anticoagulation Clinic. 3)  Lipitor 80 Mg Tabs (Atorvastatin Calcium) .Marland Kitchen.. 1 Daily 4)  Loperamide Hcl 2 Mg Caps (Loperamide Hcl) .Marland Kitchen.. 1 Daily 5)  Allopurinol 100 Mg Tabs (Allopurinol) .Marland Kitchen.. 1 Daily 6)  Bd Insulin Syringe Ultrafine 31g X 5/16" 0.5 Ml Misc (Insulin Syringe-Needle U-100) .Marland Kitchen.. 1cc Use As Directed 7)  Humalog Kwikpen 100 Unit/ml Soln (Insulin Lispro (Human)) .... Qid (Just Before Each Meal) 20-40-45-10 Units 8)  Lasix 60 Mg Tabs (Furosemide) .Marland Kitchen.. 1 Tab Two Times A Day 9)  Kayexalate  Powd (Sodium Polystyrene Sulfonate) .... 2 Oz Daily 10)  Lantus 100 Unit/ml Soln (Insulin Glargine) .... 20 Units At Bedtime 11)  Amlodipine Besylate 10 Mg Tabs (Amlodipine Besylate) .Marland Kitchen.. 1 Tab Qam 12)  Fenofibrate Micronized 134 Mg Caps (Fenofibrate Micronized) .Marland Kitchen.. 1 Qd 13)  Lansoprazole 30 Mg Cpdr (Lansoprazole) .Marland Kitchen.. 1 Qd 14)  Onetouch Ultra Test  Strp (Glucose Blood) .... Two Times A Day, and Lancets  250.00 15)  Tricor 145 .... Once Daily  Allergies (verified): No Known Drug Allergies  Review of Systems  The patient denies fever.         he had night sweats last night  Physical Exam  General:  no distress  Lungs:  Clear to auscultation bilaterally. Normal respiratory effort.  Heart:  Regular rate and rhythm without murmurs or gallops noted. Normal S1,S2.   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 there is a deep ulcer occupying much of the plantar aspect of the right foot (with malodorous drainage), and a broad rim of erythema. Neurologic:  sensation is absent to touch on the feet    Impression & Recommendations:  Problem # 1:  FOOT ULCER, RIGHT (ICD-707.10) severe  Problem # 2:  DIABETES MELLITUS, TYPE II (ICD-250.00) worse due to #1  Problem # 3:  RENAL INSUFFICIENCY (ICD-588.9) recently had shunt placed.  Other Orders: Est. Patient Level IV (84696)  Patient Instructions: 1)  (i discussed with dr patel, infectious diseases, who is admitting dr.) 2)  admit 3)  at dr patel's advise, will hold-off on antibiotics for now. 4)  labs and x ray. 5)  continue insulin. 6)  i have taken a bactreial culture from the  wound, and sent to our lab 7)  we discussed code status.  pt requests full code, but would not want to be started or maintained on artificial life-support measures if there was not a reasonable chance of recovery.   Appended Document: Orders Update     Clinical Lists Changes  Orders: Added new Test order of T-Culture, Wound ([id number]) - Signed

## 2010-07-14 NOTE — Progress Notes (Signed)
  Phone Note Refill Request Message from:  Fax from Pharmacy on February 03, 2010 9:48 AM  Refills Requested: Medication #1:  LOVAZA 1 GM CAPS 1 tab bid   Dosage confirmed as above?Dosage Confirmed   Notes: 90 day supply  Method Requested: Fax to Mail Away Pharmacy Initial call taken by: Brenton Grills MA,  February 03, 2010 9:48 AM    Prescriptions: LOVAZA 1 GM CAPS (OMEGA-3-ACID ETHYL ESTERS) 1 tab bid  #180 x 1   Entered by:   Brenton Grills MA   Authorized by:   Minus Breeding MD   Signed by:   Brenton Grills MA on 02/03/2010   Method used:   Faxed to ...       [facility]       [address] [place], [place]  [address]       Ph: [phone]       Fax: [phone]   RxID:   [id number]

## 2010-07-14 NOTE — Assessment & Plan Note (Signed)
Summary: EPH/OK PER PT/JML      Allergies Added: NKDA  Visit Type:  EPH Referring Provider:  Oliver Barre Primary Provider:  Oliver Barre  CC:  follow-up of atrial flutter.  History of Present Illness: The patient presents today for routine electrophysiology followup. He reports doing very well since his atrial flutter ablation.   Today he states "I feel great".  He denies any further symptoms of sustained arrhythmia.  The patient denies symptoms of chest pain, shortness of breath, orthopnea, PND, lower extremity edema, dizziness, presyncope, syncope, or neurologic sequela. The patient is tolerating medications without difficulties and is otherwise without complaint today.   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 Diabetes mellitus, type II non-compliance Coronary artery disease - non obstructive per cath 1/10; dr Juanda Chance Anticoagulation therapy hx of DFU's hx of duodenal polyp by EGD - 8/08 - Dr Orr/GI Peripheral neuropathy gastroparesis erectile dysfunction Nephrolithiasis, hx of bilateral renal cyst Renal insufficiency - dr Briant Cedar   Past Surgical History: s/p aflutter ablation - dr Graciela Husbands s/p atypical atrial flutter ablaiton Dr Johney Frame 07/08/09 s/p pilonidal cyst s/p right arm surgury after fracture planned - [facility] (right) for march 2010 - dr duda  Vital Signs:  Patient profile:   60 year old male Height:      73 inches Weight:      234 pounds BMI:     30.98 Pulse rate:   88 / minute Pulse rhythm:   irregular BP sitting:   132 / 80  (left arm) Cuff size:   large  Vitals Entered By: Danielle Rankin, CMA (July 30, 2009 1:58 PM)  Physical Exam  General:  normal appearance.   Head:  normocephalic and atraumatic Eyes:  No corneal or conjunctival inflammation noted. EOMI. Perrla.  Mouth:  Teeth, gums and palate normal. Oral mucosa normal. Neck:  Neck supple, no JVD. No masses, thyromegaly or abnormal cervical nodes. Lungs:  Clear bilaterally to auscultation and percussion. Heart:  RRR, 1/6 SEM LUSB Abdomen:  Bowel sounds positive; abdomen soft and non-tender without masses, organomegaly, or hernias noted. No hepatosplenomegaly. Msk:  Back normal, normal gait. Muscle strength and tone normal. Pulses:  pulses normal in all 4 extremities Extremities:  No clubbing or cyanosis.  1+BLE edema Neurologic:  Alert and oriented x 3.  CNII-XII intact, strength/sensation are intact Skin:  Intact without lesions or rashes. Cervical Nodes:  no significant adenopathy Psych:  Normal affect.   EKG  Procedure date:  07/30/2009  Findings:      sinus rhythm 88 bpm, PR , nonspecific ST/T changes  Impression & Recommendations:  Problem # 1:  ATRIAL FLUTTER (ICD-427.32) Doing well s/p ablation for mitral annular flutter. I see no prior documentation of afib. We will therefore stop coumadin after 30 days post ablation.  He will remain off of coumadin unless further afib/ atypical flutter arise.  His updated medication list for this problem includes:    Coumadin 5 Mg Tabs (Warfarin sodium) ..... Use as directed by anticoagulation clinic.  Problem # 2:  SVT/ PSVT/ PAT (ICD-427.0) The patient has a h/o avnrt and is doing well without recurrence s/p ablation. He had av nodal modification by me 07/08/09.  He has 1st degree av block with occasional mobitz I AV block s/p ablation. He is asymptomatic with this.  The following medications were removed from the medication list:    Lisinopril 40 Mg Tabs (Lisinopril) .Marland Kitchen... 1 tab qd His updated medication list for this problem includes:    Coumadin 5 Mg Tabs  (Warfarin sodium) ..... Use as directed by anticoagulation clinic.    Amlodipine Besylate 5 Mg Tabs (Amlodipine besylate) .Marland Kitchen... 1 tab qam  Problem # 3:  RENAL INSUFFICIENCY (ICD-588.9) he has been evaluated by vascular surgery and has av fistula planned for 08/14/09  Problem # 4:  HYPERTENSION (ICD-401.9) stable  The following medications were removed from the medication list:    Lisinopril 40 Mg Tabs (Lisinopril) .Marland Kitchen... 1 tab qd His updated medication list for this problem includes:    Lasix 40 Mg Tabs (Furosemide) .Marland Kitchen... 1 tab two times a day    Amlodipine Besylate 5 Mg Tabs (Amlodipine besylate) .Marland Kitchen... 1 tab qam  Patient Instructions: 1)  Your physician recommends that you schedule a follow-up appointment in: 2 months with Dr Johney Frame

## 2010-07-14 NOTE — Medication Information (Signed)
Summary: Coumadin Clinic  Anticoagulant Therapy  Managed by: Weston Brass, PharmD Referring MD: Sherryl Manges MD PCP: Oliver Barre Supervising MD: Tenny Craw MD, Gunnar Fusi Indication 1: Atrial Fibrillation (ICD-427.31) Lab Used: [facility] Site: [facility] PT 16.1 INR POC 1.21 INR RANGE 2.0-3.0  Dietary changes: no    Health status changes: no    Bleeding/hemorrhagic complications: no    Recent/future hospitalizations: no    Any changes in medication regimen? no    Recent/future dental: no  Any missed doses?: yes     Details: Pt was out of Coumadin for the past few days.  Has actually only had 1 dose of Coumadin since he was told to hold afte rhigh reading last week.   Is patient compliant with meds? yes       Allergies: No Known Drug Allergies  Anticoagulation Management History:      His anticoagulation is being managed by telephone today.  Positive risk factors for bleeding include presence of serious comorbidities.  Negative risk factors for bleeding include an age less than 18 years old.  The bleeding index is 'intermediate risk'.  Positive CHADS2 values include History of HTN and History of Diabetes.  Negative CHADS2 values include Age > 62 years old.  The start date was 01/31/2007.  His last INR was 2.4.  Prothrombin time is 16.1.  Anticoagulation responsible provider: Tenny Craw MD, Gunnar Fusi.  INR POC: 1.21.  Exp: 09/2010.    Anticoagulation Management Assessment/Plan:      The patient's current anticoagulation dose is Coumadin 5 mg tabs: take as directed.  The target INR is 2.0-3.0.  The next INR is due 10/27/2009.  Anticoagulation instructions were given to patient.  Results were reviewed/authorized by Weston Brass, PharmD.  He was notified by Weston Brass PharmD.         Prior Anticoagulation Instructions: INR 3.92  Spoke with pt.  Restart Coumadin at normal dose of 1 tablet daily except 1 1/2 tablets on Wednesday.  Orders given to [facility] with [facility]  Current Anticoagulation  Instructions: INR 1.21  Spoke with pt.  Take 1 1/2 tablets today and tomorrow then resume same dose of 1 tablet every day except 1 1/2 tablets on Wednesday.  Recheck next week.  Weston Brass PharmD  Oct 23, 2009 6:30 PM    Spoke with Waynetta Sandy at [facility].  Gave orders to recheck INR on 5/16.

## 2010-07-14 NOTE — Progress Notes (Signed)
Summary: verify meds  Phone Note From Other Clinic   [facility]/ [phone] Reason for Call: Medication Check Summary of Call: Recieved 2 script for Kalexate powder need to clarify directions and BD U/F syringe need to verify which cc. when call back use ref # [id number] Initial call taken by: Orlan Leavens,  February 28, 2009 11:49 AM  Follow-up for Phone Call        Notified [facility] spoke with Shannon/rep verify rx for BD syringes. But they want direction for the [facility] powder and what he need to mix  with.  Follow-up by: Orlan Leavens,  February 28, 2009 11:57 AM  Additional Follow-up for Phone Call Additional follow up Details #1::        i do not prescribe this medicatrion, so i do not know.  ? dr Jonny Ruiz, or nephrol Additional Follow-up by: Minus Breeding MD,  February 28, 2009 12:29 PM    Additional Follow-up for Phone Call Additional follow up Details #2::    Dr. Jonny Ruiz, pt saw dr Everardo All on 02/25/09 and rx for kalexate powder was sent to [facility] they are needing directions and how med is mix before they can fill rx. Dr. Everardo All wanted [place] to ask you sent he did'nt rx , but he did sent refill in on 02/25/09( see office note). Follow-up by: Orlan Leavens,  February 28, 2009 1:09 PM  Additional Follow-up for Phone Call Additional follow up Details #3:: Details for Additional Follow-up Action Taken: sean - EMR shows you have done the one and only rx on the kayexalate on sept 14 -   can you handle the clarification? Additional Follow-up by: Corwin Levins MD,  February 28, 2009 1:49 PM  i refilled x 1.  it looks like dr Briant Cedar is prescribing dr.  please route request to him. sean elllison, md  Notified [facility] spoke with Kris/pharm will be cx rx for [facility]. med was rx by Dr. Briant Cedar will contact his office. 02/28/09@2 :31/LMB

## 2010-07-14 NOTE — Assessment & Plan Note (Signed)
Summary: rov f/u a-fib/kl      Allergies Added: NKDA  Visit Type:  Follow-up Referring Provider:  Oliver Barre Primary Provider:  Oliver Barre   History of Present Illness: The patient presents today for routine electrophysiology followup.  He is in sinus rhythm today.  He reports rare palpitations.   His SOB has improved.  He does have some fatigue.  The patient denies symptoms of chest pain, dizziness, presyncope, syncope, or neurologic sequela. He has chronic stable edema from advanced renal disease.   He has not started dialysis but appears to be doing well. The patient is tolerating medications without difficulties and is otherwise without complaint today.  He is being evaluated at [facility] and is being considered for renal transplantation.  Dr Briant Cedar [facility] is his nephrologist.  Current Medications (verified): 1)  Lovaza 1 Gm Caps (Omega-3-Acid Ethyl Esters) .Marland Kitchen.. 1 Tab Bid 2)  Lipitor 80 Mg Tabs (Atorvastatin Calcium) .Marland Kitchen.. 1 Daily 3)  Loperamide Hcl 2 Mg Caps (Loperamide Hcl) .... 2 Daily 4)  Allopurinol 100 Mg Tabs (Allopurinol) .Marland Kitchen.. 1 Daily 5)  Bd Insulin Syringe Ultrafine 31g X 5/16" 0.5 Ml Misc (Insulin Syringe-Needle U-100) .Marland Kitchen.. 1cc Use As Directed 6)  Humalog Kwikpen 100 Unit/ml Soln (Insulin Lispro (Human)) .... Qid (Just Before Each Meal) 20-40-45-10 Units 7)  Furosemide 80 Mg Tabs (Furosemide) .... 2 Tabs By Mouth in Am 2 Tabs By Mouth in Pm 8)  Kayexalate  Powd (Sodium Polystyrene Sulfonate) .... 2 Oz Daily 9)  Amlodipine Besylate 10 Mg Tabs (Amlodipine Besylate) .... Take One Tablet By Mouth Daily 10)  Fenofibrate Micronized 134 Mg Caps (Fenofibrate Micronized) .Marland Kitchen.. 1 Qd 11)  Onetouch Ultra Test  Strp (Glucose Blood) .... Two Times A Day, and Lancets 250.00 12)  Calcitriol 0.25 Mcg Caps (Calcitriol) .Marland Kitchen.. 1 Tab By Mouth Sun, T, Th, Sat 2 Tabs By Mouth M W F 13)  Sodium Bicarbonate 650 Mg Tabs (Sodium Bicarbonate) .... Two Times A  Day 14)  Aranesp (Albumin Free) 150 Mcg/0.75ml Soln (Darbepoetin Alfa-Polysorbate) .... Every 2 Weeks 15)  Prilosec 20 Mg Cpdr (Omeprazole) .... At Bedtime 16)  Coumadin 5 Mg Tabs (Warfarin Sodium) .... Take As Directed 17)  Eliphos 667 Mg Tabs (Calcium Acetate (Phos Binder)) .... 2 Tablets By Mouth At Each Meal  Allergies (verified): No Known Drug Allergies  Past History:  Family History: Last updated: 08/19/2008 DM - both grandmothers and father mother with GBMF - died 16yo father died age 24 yo with aneurysm  Social History: Last updated: 01/02/2010 Current Smoker (<1/2 PPD) Married 3 children Alcohol use-no occasional marijuana work - sales rep - [employer]  Past Medical History: Reviewed history from 09/24/2009 and no changes required. 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 s/p aflutter ablation - dr Graciela Husbands s/p atypical atrial flutter ablaiton Dr Johney Frame 07/08/09 s/p pilonidal cyst s/p right arm surgury  after fracture planned - [facility] surg[facility] (right) for march 2010 - dr duda  Social History: Reviewed history from 08/01/2008 and no changes required. Current Smoker (<1/2 PPD) Married 3 children Alcohol use-no occasional marijuana work - sales rep - [employer]  Review of Systems       All systems are reviewed and negative except as listed in the HPI.   Vital Signs:  Patient profile:   60 year old male Height:      73 inches Weight:      226 pounds BMI:     29.92 Pulse rate:   92 / minute  Vitals Entered By: Laurance Flatten CMA (January 02, 2010 12:15 PM)  Physical Exam  General:  chronically ill but appears good today, ambulates by wheelchair Head:  normocephalic and atraumatic Eyes:  PERRLA/EOM intact; conjunctiva and lids normal. Mouth:  Teeth, gums and palate normal. Oral mucosa normal. Neck:  Neck supple, no JVD. No masses, thyromegaly or abnormal cervical nodes. Lungs:  Clear bilaterally to auscultation and percussion. Heart:  RRR, no m/r/g Abdomen:  Bowel sounds positive; abdomen soft and non-tender without masses, organomegaly, or hernias noted. No hepatosplenomegaly. Msk:   R leg dressing in place Pulses:  pulses normal in all 4 extremities Extremities:  chronic edema Neurologic:  Alert and oriented x 3. Skin:  Intact without lesions or rashes. Cervical Nodes:  no significant adenopathy Psych:  Normal affect.   EKG  Procedure date:  01/02/2010  Findings:      SInus rhythm first degree AV block ( )  Impression & Recommendations:  Problem # 1:  ATRIAL FIBRILLATION (ICD-427.31)  Now in sinus. Continue coumadin At this point, we will not start an antiarrhythmic medicine, but will follow for symptomatic afib.  Orders: Nuclear Stress Test (Nuc Stress Test)  Problem # 2:  RENAL INSUFFICIENCY (ICD-588.9) We are asked to make a pre-operative assessment prior to renal transplantation. He has multiple CAD risk factors but no known CAD.  As his activity level is supressed, I would recommend Lexiscan myoview.   We will therefore plan lexiscan myoview.  If low risk I would proceed with surgery.  If high risk, further risk stratefication may be necessary.  This would be very difficulty, given his renal  failure.  Problem # 3:  HYPERTENSION (ICD-401.9)  stable  Orders: Nuclear Stress Test (Nuc Stress Test)  Patient Instructions: 1)  Your physician recommends that you schedule a follow-up appointment in: 5 months with Dr Johney Frame 2)  Your physician has requested that you have an lexiscanmyoview.  For further information please visit [email or web address]  Please follow instruction sheet, as given.  Appended Document: [facility]      Allergies: No Known Drug Allergies  Vital Signs:  Patient profile:   60 year old male BP sitting:   132 / 70  Vitals Entered By: Laurance Flatten CMA (January 02, 2010 2:31 PM)

## 2010-07-14 NOTE — Letter (Signed)
Summary: [facility]   Imported By: Lester [place] 08/29/2009 09:26:29  _____________________________________________________________________  External Attachment:    Type:   Image     Comment:   External Document

## 2010-07-14 NOTE — Progress Notes (Signed)
  Phone Note Refill Request  on November 25, 2009 3:08 PM  Refills Requested: Medication #1:  LIPITOR 80 MG TABS 1 daily   Dosage confirmed as above?Dosage Confirmed   Last Refilled: 02/25/2009   Notes: [facility] Initial call taken by: Scharlene Gloss,  November 25, 2009 3:08 PM    Prescriptions: LIPITOR 80 MG TABS (ATORVASTATIN CALCIUM) 1 daily  #90 x 3   Entered by:   Scharlene Gloss   Authorized by:   Minus Breeding MD   Signed by:   Scharlene Gloss on 11/25/2009   Method used:   Faxed to ...       [facility]       [address] [place], [place]  [address]       Ph: [phone]       Fax: [phone]   RxID:   [id number]

## 2010-07-14 NOTE — Medication Information (Signed)
Summary: rov/ewj  Anticoagulant Therapy  Managed by: Shelby Dubin, PharmD, BCPS, CPP Referring MD: Sherryl Manges MD PCP: Oliver Barre Supervising MD: Daleen Squibb MD, Maisie Fus Indication 1: Atrial Fibrillation (ICD-427.31) Lab Used: [facility] Site: [facility] INR POC 2.0 INR RANGE 2.0-3.0  Dietary changes: no    Health status changes: yes       Details: tightness in am associated with SOB--resolves within 20 min after awakening...  Bleeding/hemorrhagic complications: no    Recent/future hospitalizations: yes       Details: Will have AV graft done in next few weeks--[place] visit next week.   Any changes in medication regimen? no    Recent/future dental: no  Any missed doses?: no       Is patient compliant with meds? yes      Comments: Pt states no breathing problems normally, just with "deep breaths" upon awakening.  Thinks may be more likely associated with lung issue instead of cardiac.  Agrees to call back with symptoms or worsening in the meantime.Marland Kitchenmp  Current Medications (verified): 1)  Lovaza 1 Gm Caps (Omega-3-Acid Ethyl Esters) .... 2 Tab Bid 2)  Coumadin 5 Mg Tabs (Warfarin Sodium) .... Use As Directed By Anticoagulation Clinic. 3)  Lipitor 80 Mg Tabs (Atorvastatin Calcium) .Marland Kitchen.. 1 Daily 4)  Loperamide Hcl 2 Mg Caps (Loperamide Hcl) .Marland Kitchen.. 1 Daily 5)  Allopurinol 100 Mg Tabs (Allopurinol) .Marland Kitchen.. 1 Daily 6)  Hydrocodone-Acetaminophen 5-500 Mg Tabs (Hydrocodone-Acetaminophen) .... 1/2 - 1 By Mouth At Bedtime As Needed 7)  Oxycodone-Acetaminophen 5-325 Mg Tabs (Oxycodone-Acetaminophen) .Marland Kitchen.. 1 By Mouth As Needed Renal Stone 8)  Bd Insulin Syringe Ultrafine 31g X 5/16" 0.5 Ml Misc (Insulin Syringe-Needle U-100) .Marland Kitchen.. 1cc Use As Directed 9)  Humalog Kwikpen 100 Unit/ml Soln (Insulin Lispro (Human)) .... Qid (Just Before Each Meal) 20-40-50-10 Units 10)  Lisinopril 40 Mg Tabs (Lisinopril) .Marland Kitchen.. 1 Tab Qd 11)  Lasix 40 Mg Tabs (Furosemide) .Marland Kitchen.. 1 Tab Two Times A Day 12)  Kayexalate  Powd  (Sodium Polystyrene Sulfonate) .... 2 Oz Daily 13)  Lantus 100 Unit/ml Soln (Insulin Glargine) .... 80 Units Qhs 14)  Amlodipine Besylate 5 Mg Tabs (Amlodipine Besylate) .Marland Kitchen.. 1 Tab Qam 15)  Fenofibrate Micronized 134 Mg Caps (Fenofibrate Micronized) .Marland Kitchen.. 1 Qd 16)  Lansoprazole 30 Mg Cpdr (Lansoprazole) .Marland Kitchen.. 1 Qd 17)  Onetouch Ultra Test  Strp (Glucose Blood) .... Two Times A Day, and Lancets 250.00  Allergies (verified): No Known Drug Allergies  Anticoagulation Management History:      The patient is taking warfarin and comes in today for a routine follow up visit.  Positive risk factors for bleeding include presence of serious comorbidities.  Negative risk factors for bleeding include an age less than 2 years old.  The bleeding index is 'intermediate risk'.  Positive CHADS2 values include History of HTN and History of Diabetes.  Negative CHADS2 values include Age > 36 years old.  The start date was 01/31/2007.  His last INR was 2.4.  Anticoagulation responsible provider: Daleen Squibb MD, Maisie Fus.  INR POC: 2.0.  Cuvette Lot#: [id number].  Exp: 09/2010.    Anticoagulation Management Assessment/Plan:      The patient's current anticoagulation dose is Coumadin 5 mg tabs: Use as directed by anticoagulation clinic..  The target INR is 2.0-3.0.  The next INR is due 08/14/2009.  Anticoagulation instructions were given to patient.  Results were reviewed/authorized by Shelby Dubin, PharmD, BCPS, CPP.  He was notified by Shelby Dubin PharmD, BCPS, CPP.  Prior Anticoagulation Instructions: INR 4.2  Skip 1 day then 5 mg daily except 2.5 mg Tuesdays.    Recheck in 10 days.    Current Anticoagulation Instructions: INR 2.0  Continue 1 tab daily except 0.5 tab each Tuesday.    Recheck in 3 weeks.

## 2010-07-14 NOTE — Medication Information (Signed)
Summary: rov/tm  Anticoagulant Therapy  Managed by: Bethena Midget, RN, BSN Referring MD: Sherryl Manges MD PCP: Oliver Barre Supervising MD: Antoine Poche MD, Fayrene Fearing Indication 1: Atrial Fibrillation (ICD-427.31) Lab Used: [facility] Site: [facility] INR POC 3.4 INR RANGE 2.0-3.0  Dietary changes: no    Health status changes: no    Bleeding/hemorrhagic complications: no    Recent/future hospitalizations: no    Any changes in medication regimen? no    Recent/future dental: no  Any missed doses?: no       Is patient compliant with meds? yes      Comments: Pt. seeing Dr. Ladona Ridgel today, they are trying to schedule Ablation. Ablation schedule for tomorrow, 07/08/09 with Dr. Johney Frame.   Allergies: No Known Drug Allergies  Anticoagulation Management History:      The patient is taking warfarin and comes in today for a routine follow up visit.  Positive risk factors for bleeding include presence of serious comorbidities.  Negative risk factors for bleeding include an age less than 66 years old.  The bleeding index is 'intermediate risk'.  Positive CHADS2 values include History of HTN and History of Diabetes.  Negative CHADS2 values include Age > 14 years old.  The start date was 01/31/2007.  His last INR was 2.4.  Anticoagulation responsible provider: Antoine Poche MD, Fayrene Fearing.  INR POC: 3.4.  Cuvette Lot#: [id number].  Exp: 07/2010.    Anticoagulation Management Assessment/Plan:      The patient's current anticoagulation dose is Coumadin 5 mg tabs: Use as directed by anticoagulation clinic.  **Past due for clinic visit!**.  The target INR is 2.0-3.0.  The next INR is due 07/15/2009.  Anticoagulation instructions were given to patient.  Results were reviewed/authorized by Bethena Midget, RN, BSN.  He was notified by Bethena Midget, RN, BSN.         Prior Anticoagulation Instructions: INR: 2.4 Continue with same dosage of 5mg  tablet daily. Recheck in 2 weeks. Patient prefers to come back to office in 4 weeks  instead.  Current Anticoagulation Instructions: INR 3.4 Tuesday take 2.5mg s then change dose to 5mg s daily except 2.5mg s on Tuesdays. Recheck in one week.

## 2010-07-14 NOTE — Medication Information (Signed)
Summary: rov/tm   Anticoagulant Therapy  Managed by: Weston Brass, PharmD Referring MD: Hillis Range, MD PCP: Oliver Barre Supervising MD: Tenny Craw MD, Gunnar Fusi Indication 1: Atrial Fibrillation (ICD-427.31) Lab Used: [facility] Site: [facility] INR POC 2.4 INR RANGE 2.0-3.0  Dietary changes: no    Health status changes: no    Bleeding/hemorrhagic complications: no    Recent/future hospitalizations: no    Any changes in medication regimen? no    Recent/future dental: yes     Details: abscessed tooth; having tooth pulled today  Any missed doses?: no       Is patient compliant with meds? yes      Comments: Pt reports taking 1/2 tablet every day rather than 1 tablet on Friday.   Allergies: No Known Drug Allergies  Anticoagulation Management History:      The patient is taking warfarin and comes in today for a routine follow up visit.  Positive risk factors for bleeding include presence of serious comorbidities.  Negative risk factors for bleeding include an age less than 51 years old.  The bleeding index is 'intermediate risk'.  Positive CHADS2 values include History of HTN and History of Diabetes.  Negative CHADS2 values include Age > 54 years old.  The start date was 01/31/2007.  His last INR was 6.7.  Anticoagulation responsible provider: Tenny Craw MD, Gunnar Fusi.  INR POC: 2.4.  Exp: 03/2011.    Anticoagulation Management Assessment/Plan:      The patient's current anticoagulation dose is Coumadin 5 mg tabs: take as directed.  The target INR is 2.0-3.0.  The next INR is due 06/17/2010.  Anticoagulation instructions were given to patient.  Results were reviewed/authorized by Weston Brass, PharmD.  He was notified by Weston Brass PharmD.         Prior Anticoagulation Instructions: INR 3.1  Start taking 1/2 tablet daily except 1 tablet on Fridays.  Recheck in 2 weeks.    Current Anticoagulation Instructions: INR 2.4  Continue same dose of 1/2 tablet every day.  Recheck INR in 4 weeks.

## 2010-07-14 NOTE — Letter (Signed)
Summary: ELectrophysiology/Ablation Procedure Instructions  [facility], Main Office  [address]   [place], [place] [address]   Phone: [phone]  Fax: [phone]     Electrophysiology/Ablation Procedure Instructions    You are scheduled for a(n) Ablation on Jan. 25, 2011 at 1230 with Dr. Johney Frame.  1.  Please come to the Short Stay Center at [facility] at 0700 on the day of your procedure.  2.  Come prepared to stay overnight.   Please bring your insurance cards and a list of your medications.  3.  Do not have anything to eat or drink after midnight the night before your procedure. Please eat all the greens you can tolerate today before midnight, per Dr. Ladona Ridgel.   4.  Only take 1/2 of your diabetic medications tonight and NONE tomorrow. Do NOT take any Coumadin tonight. All of your remaining medications may be taken with a small amount of water.  6.  Educational material received:  _____ EP   __x___ Ablation   * Occasionally, EP studies and ablations can become lengthy.  Please make your family aware of this before your procedure starts.  Average time ranges from 2-8 hours for EP studies/ablations.  Your physician will locate your family after the procedure with the results.  * If you have any questions after you get home, please call the office at [phone].

## 2010-07-14 NOTE — Medication Information (Signed)
Summary: rov/sp  Anticoagulant Therapy  Managed by: Eda Keys, PharmD Referring MD: Sherryl Manges MD PCP: Oliver Barre Supervising MD: Daleen Squibb MD, Maisie Fus Indication 1: Atrial Fibrillation (ICD-427.31) Lab Used: [facility] Site: [facility] PT 99.0 INR POC >8.0 INR RANGE 2.0-3.0  Dietary changes: no    Health status changes: no    Bleeding/hemorrhagic complications: no    Recent/future hospitalizations: no    Any changes in medication regimen? yes       Details: pt took last dose of IV vanc this past friday  Recent/future dental: no  Any missed doses?: yes     Details: Pt sometimes forgets to take 1/2 tablets on wednesday and takes 1 tab every day.    Is patient compliant with meds? yes       Allergies: No Known Drug Allergies  Anticoagulation Management History:      The patient is taking warfarin and comes in today for a routine follow up visit.  Positive risk factors for bleeding include presence of serious comorbidities.  Negative risk factors for bleeding include an age less than 64 years old.  The bleeding index is 'intermediate risk'.  Positive CHADS2 values include History of HTN and History of Diabetes.  Negative CHADS2 values include Age > 53 years old.  The start date was 01/31/2007.  His last INR was 2.4 and today's INR is 9.7.  Prothrombin time is 99.0.  Anticoagulation responsible provider: Daleen Squibb MD, Maisie Fus.  INR POC: >8.0.  Cuvette Lot#: [id number].  Exp: 01/2011.    Anticoagulation Management Assessment/Plan:      The patient's current anticoagulation dose is Coumadin 5 mg tabs: take as directed.  The target INR is 2.0-3.0.  The next INR is due 11/11/2009.  Anticoagulation instructions were given to patient.  Results were reviewed/authorized by Eda Keys, PharmD.  He was notified by Eda Keys.         Prior Anticoagulation Instructions: INR 2.06 Continue 5mg s daily except 7.5mg s on Wednesdays. Recheck in one week. Orders given to [facility] team- Beth  was given orders.   Current Anticoagulation Instructions: INR >8.0  Do NOT take coumadin until we can call you with lab results and give you further instructions.   Results from lab 9.7 Spoke with pt.  Hold Coumadin until next appt on Tuesday.  Pt instructed to go to ER with any signs of bleeding. Weston Brass PharmD  Nov 06, 2009 4:54 PM      Appended Document: rov/sp agree with plan.

## 2010-07-14 NOTE — Assessment & Plan Note (Signed)
Summary: per check out/sf  Medications Added ARANESP (ALBUMIN FREE) 150 MCG/0.75ML SOLN (DARBEPOETIN ALFA-POLYSORBATE) every 2 weeks ELIPHOS 667 MG TABS (CALCIUM ACETATE (PHOS BINDER)) once daily      Allergies Added: NKDA  Visit Type:  Follow-up Referring Provider:  Oliver Barre Primary Provider:  Oliver Barre   History of Present Illness: The patient presents today for routine electrophysiology followup.  He is tolerating his afib.   His SOB has improved.  He does have some fatigue.  Unfortunately, his INRs have been very labile. The patient denies symptoms of chest pain, dizziness, presyncope, syncope, or neurologic sequela. He has chronic stable edema from advanced renal disease.  The patient is tolerating medications without difficulties and is otherwise without complaint today.   Current Medications (verified): 1)  Lovaza 1 Gm Caps (Omega-3-Acid Ethyl Esters) .Marland Kitchen.. 1 Tab Bid 2)  Lipitor 80 Mg Tabs (Atorvastatin Calcium) .Marland Kitchen.. 1 Daily 3)  Loperamide Hcl 2 Mg Caps (Loperamide Hcl) .Marland Kitchen.. 1 Daily 4)  Allopurinol 100 Mg Tabs (Allopurinol) .Marland Kitchen.. 1 Daily 5)  Bd Insulin Syringe Ultrafine 31g X 5/16" 0.5 Ml Misc (Insulin Syringe-Needle U-100) .Marland Kitchen.. 1cc Use As Directed 6)  Humalog Kwikpen 100 Unit/ml Soln (Insulin Lispro (Human)) .... Qid (Just Before Each Meal) 20-40-45-10 Units 7)  Furosemide 80 Mg Tabs (Furosemide) .... Take One Tablet By Mouth Daily. 8)  Kayexalate  Powd (Sodium Polystyrene Sulfonate) .... 2 Oz Daily 9)  Lantus 100 Unit/ml Soln (Insulin Glargine) .... 40 Units At Bedtime 10)  Amlodipine Besylate 10 Mg Tabs (Amlodipine Besylate) .... Take One Tablet By Mouth Daily 11)  Fenofibrate Micronized 134 Mg Caps (Fenofibrate Micronized) .Marland Kitchen.. 1 Qd 12)  Onetouch Ultra Test  Strp (Glucose Blood) .... Two Times A Day, and Lancets 250.00 13)  Oxycodone Hcl 5 Mg Tabs (Oxycodone Hcl) .... As Needed 14)  Calcitriol 0.25 Mcg Caps (Calcitriol) .... At Bedtime 15)  Renagel 800 Mg Tabs (Sevelamer  Hcl) .... Three Times A Day 16)  Sodium Bicarbonate 650 Mg Tabs (Sodium Bicarbonate) .... Two Times A Day 17)  Ferrous Sulfate 325 (65 Fe) Mg Tbec (Ferrous Sulfate) .... Two Times A Day 18)  Aranesp (Albumin Free) 150 Mcg/0.30ml Soln (Darbepoetin Alfa-Polysorbate) .... Every 2 Weeks 19)  Prilosec 20 Mg Cpdr (Omeprazole) .... At Bedtime 20)  Coumadin 5 Mg Tabs (Warfarin Sodium) .... Take As Directed 21)  Eliphos 667 Mg Tabs (Calcium Acetate (Phos Binder)) .... Once Daily  Allergies (verified): No Known Drug Allergies  Past History:  Past Medical History: Reviewed history from 09/24/2009 and no changes required. 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 s/p aflutter ablation - dr Graciela Husbands s/p atypical atrial flutter ablaiton Dr Johney Frame 07/08/09 s/p pilonidal cyst s/p right arm surgury after fracture planned - [facility] (right) for march 2010 - dr duda  Social History: Reviewed history from 08/01/2008 and no changes required. Current Smoker Married 3 children Alcohol use-no  occasional marijuana work - sales rep - [employer]  Review of Systems       All systems are reviewed and negative except as listed in the HPI.   Vital Signs:  Patient profile:   60 year old  male Height:      73 inches Weight:      224 pounds Pulse rate:   94 / minute BP sitting:   130 / 70  (left arm)  Vitals Entered By: Laurance Flatten CMA (November 12, 2009 11:57 AM)  Physical Exam  General:  chronically ill, NAD Head:  normocephalic and atraumatic Eyes:  PERRLA/EOM intact; conjunctiva and lids normal. Mouth:  Teeth, gums and palate normal. Oral mucosa normal. Neck:  supple, JVP 9 Lungs:  clear Heart:  iRRR, no m/r/g Abdomen:  Bowel sounds positive; abdomen soft and non-tender without masses, organomegaly, or hernias noted. No hepatosplenomegaly. Msk:  R leg in ace wrap Pulses:  pulses normal in all 4 extremities Extremities:  1+ BLE edema Neurologic:  Alert and oriented x 3. Skin:  Intact without lesions or rashes. Cervical Nodes:  no significant adenopathy Psych:  Normal affect.   Impression & Recommendations:  Problem # 1:  ATRIAL FIBRILLATION (ICD-427.31) continue rate control until his INRs are stable once INRs are stable, will likely initiate amiodarone and persue rhythm control  Problem # 2:  HYPERTENSION (ICD-401.9) stable  Problem # 3:  RENAL INSUFFICIENCY (ICD-588.9) stable  Patient Instructions: 1)  Your physician recommends that you schedule a follow-up appointment in: 6 weeks with Dr Johney Frame

## 2010-07-14 NOTE — Letter (Signed)
Summary: [facility]  [facility]   Imported By: Sherian Rein 12/18/2009 12:50:00  _____________________________________________________________________  External Attachment:    Type:   Image     Comment:   External Document

## 2010-07-14 NOTE — Letter (Signed)
Summary: [facility]  [facility]   Imported By: Debby Freiberg 12/11/2009 12:13:30  _____________________________________________________________________  External Attachment:    Type:   Image     Comment:   External Document

## 2010-07-14 NOTE — Assessment & Plan Note (Signed)
Summary: 2 WK FU---STC   Vital Signs:  Patient profile:   60 year old male Height:      73 inches (185.42 cm) Weight:      235 pounds (106.82 kg) O2 Sat:      96 % on Room air Temp:     97.4 degrees F (36.33 degrees C) oral Pulse rate:   96 / minute BP sitting:   142 / 64  (left arm) Cuff size:   large  Vitals Entered By: Josph Macho RMA (August 20, 2009 11:47 AM)  O2 Flow:  Room air CC: 2 week follow up/ CF Is Patient Diabetic? Yes   Referring Provider:  Oliver Barre Primary Provider:  Oliver Barre  CC:  2 week follow up/ CF.  History of Present Illness: pt states he feels well in general, except for having a fistula placed at the left upper arm.  he brings a record of his cbg's which i have reviewed today.  he had 1 episode of mild hypoglycemia (64) at hs.  others are well-controlled, except for same 200's for the few days after his surgery (he has missed his lantus for a few days after his surgery).  Current Medications (verified): 1)  Lovaza 1 Gm Caps (Omega-3-Acid Ethyl Esters) .... 2 Tab Bid 2)  Coumadin 5 Mg Tabs (Warfarin Sodium) .... Use As Directed By Anticoagulation Clinic. 3)  Lipitor 80 Mg Tabs (Atorvastatin Calcium) .Marland Kitchen.. 1 Daily 4)  Loperamide Hcl 2 Mg Caps (Loperamide Hcl) .Marland Kitchen.. 1 Daily 5)  Allopurinol 100 Mg Tabs (Allopurinol) .Marland Kitchen.. 1 Daily 6)  Bd Insulin Syringe Ultrafine 31g X 5/16" 0.5 Ml Misc (Insulin Syringe-Needle U-100) .Marland Kitchen.. 1cc Use As Directed 7)  Humalog Kwikpen 100 Unit/ml Soln (Insulin Lispro (Human)) .... Qid (Just Before Each Meal) 20-40-50-10 Units 8)  Lasix 60 Mg Tabs (Furosemide) .Marland Kitchen.. 1 Tab Two Times A Day 9)  Kayexalate  Powd (Sodium Polystyrene Sulfonate) .... 2 Oz Daily 10)  Lantus 100 Unit/ml Soln (Insulin Glargine) .... 20 Units At Bedtime 11)  Amlodipine Besylate 10 Mg Tabs (Amlodipine Besylate) .Marland Kitchen.. 1 Tab Qam 12)  Fenofibrate Micronized 134 Mg Caps (Fenofibrate Micronized) .Marland Kitchen.. 1 Qd 13)  Lansoprazole 30 Mg Cpdr (Lansoprazole) .Marland Kitchen.. 1  Qd 14)  Onetouch Ultra Test  Strp (Glucose Blood) .... Two Times A Day, and Lancets 250.00 15)  Tricor 145 .... 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    Physical Exam  General:  obese.  no distress  Extremities:  there is a healing fistula left upper forearm.    Impression & Recommendations:  Problem # 1:  DIABETES MELLITUS, TYPE II (ICD-250.00) Assessment Improved  Medications Added to Medication List This Visit: 1)  Humalog Kwikpen 100 Unit/ml Soln (Insulin lispro (human)) .... Qid (just before each meal) 20-40-45-10 units 2)  Lasix 60  Mg Tabs (furosemide)  .Marland Kitchen.. 1 tab two times a day 3)  Amlodipine Besylate 10 Mg Tabs (amlodipine Besylate)  .Marland Kitchen.. 1 tab qam 4)  Tricor 145  .... Once daily  Other Orders: Est. Patient Level III (06237)  Patient Instructions: 1)  continue lantus at 20 units at night. 2)  reduce humalog to (just before each meal) 30-40-45-20 units. 3)  take extra humalog 5 units just before each meal, and at bedtime, for any blood sugar over 200. 4)  return 2 months. 5)  check your blood sugar 2 times a day.  vary the time of day when you check, between before the 3 meals, and at bedtime.  also check if  you have symptoms of your blood sugar being too high or too low.  please keep a record of the readings and bring it to your next appointment here.  please call us sooner if you are having low blood sugar episodes.

## 2010-07-14 NOTE — Progress Notes (Signed)
  Phone Note From Pharmacy   Summary of Call: Received paperwork from [facility] on [address] (fax [phone]) stating that pt wants One Touch Ultra test strips (twice daily). Please advise? Initial call taken by: Brenton Grills,  July 22, 2009 2:50 PM  Follow-up for Phone Call        i sent rx Follow-up by: Minus Breeding MD,  July 22, 2009 3:15 PM    New/Updated Medications: ONETOUCH ULTRA TEST  STRP (GLUCOSE BLOOD) two times a day, and lancets 250.00 Prescriptions: ONETOUCH ULTRA TEST  STRP (GLUCOSE BLOOD) two times a day, and lancets 250.00  #100 x 11   Entered and Authorized by:   Minus Breeding MD   Signed by:   Minus Breeding MD on 07/22/2009   Method used:   Electronically to        [facility] (retail)       [address]       [place], [place]  [id number]       Ph: [phone] or [phone]       Fax: [phone]   RxID:   [id number]

## 2010-07-14 NOTE — Progress Notes (Signed)
   Phone Note From Other Clinic   Request: Call Patient Action Taken: Patient called Details for Reason: Abnormal Labs Summary of Call: Received call from Amy RN from [facility] concerning PT/INR for patient drawn this morning.  INR 8.7.  Called patient at home to inform him to not to take coumadin tonight.  He states he usually takes this in the morning.  I advised him to hold the coumadin until the labs are rechecked.  He verbalized understanding. Denies bleeding or pain.  Called by Amy to inform her to redrawn PT/INR in am. Initial call taken by: Joni Reining NP

## 2010-07-14 NOTE — Medication Information (Signed)
Summary: [facility]  Anticoagulant Therapy  Managed by: Weston Brass, PharmD Referring MD: Sherryl Manges MD PCP: Oliver Barre Supervising MD: Tenny Craw MD, Gunnar Fusi Indication 1: Atrial Fibrillation (ICD-427.31) Lab Used: [facility] Site: [facility] PT 38.1 INR POC 3.92 INR RANGE 2.0-3.0  Dietary changes: no    Health status changes: no    Bleeding/hemorrhagic complications: no    Recent/future hospitalizations: no    Any changes in medication regimen? no    Recent/future dental: no  Any missed doses?: yes     Details: pt has been holding dose since Tuesday for INR >8.0  Is patient compliant with meds? yes      Comments: Lab not received until 5/9. (Lab was drawn on 5/6) Pt has not had any Coumadin since lab was drawn.     Allergies: No Known Drug Allergies  Anticoagulation Management History:      His anticoagulation is being managed by telephone today.  Positive risk factors for bleeding include presence of serious comorbidities.  Negative risk factors for bleeding include an age less than 10 years old.  The bleeding index is 'intermediate risk'.  Positive CHADS2 values include History of HTN and History of Diabetes.  Negative CHADS2 values include Age > 3 years old.  The start date was 01/31/2007.  His last INR was 2.4.  Prothrombin time is 38.1.  Anticoagulation responsible provider: Tenny Craw MD, Gunnar Fusi.  INR POC: 3.92.  Exp: 09/2010.    Anticoagulation Management Assessment/Plan:      The patient's current anticoagulation dose is Coumadin 5 mg tabs: take as directed.  The target INR is 2.0-3.0.  The next INR is due 10/22/2009.  Anticoagulation instructions were given to patient.  Results were reviewed/authorized by Weston Brass, PharmD.  He was notified by Weston Brass PharmD.         Prior Anticoagulation Instructions: INR 8.78 Pt took yesterday's dose.  Skip today's dose and tomorrow's dose and orders given to [facility] with  [facility] to recheck on Friday per Dr. Weston Brass, Pharm D.    Current Anticoagulation Instructions: INR 3.92  Spoke with pt.  Restart Coumadin at normal dose of 1 tablet daily except 1 1/2 tablets on Wednesday.  Orders given to [facility] with [facility]

## 2010-07-14 NOTE — Letter (Signed)
Summary: Outpatient Coinsurance Notice  Outpatient Coinsurance Notice   Imported By: Marylou Mccoy 01/21/2010 17:34:39  _____________________________________________________________________  External Attachment:    Type:   Image     Comment:   External Document

## 2010-07-14 NOTE — Assessment & Plan Note (Signed)
Summary: cpx-lb   Vital Signs:  Patient profile:   60 year old male Height:      73 inches (185.42 cm) Weight:      243.19 pounds (110.54 kg) BMI:     32.20 O2 Sat:      97 % on Room air Temp:     98.6 degrees F (37.00 degrees C) oral Pulse rate:   85 / minute BP sitting:   122 / 64  (left arm) Cuff size:   large  Vitals Entered By: Brenton Grills MA (April 09, 2010 2:18 PM)  O2 Flow:  Room air CC: Physical/aj Is Patient Diabetic? Yes Comments Pt is due for colonoscopy   Referring Provider:  Oliver Barre Primary Provider:  Oliver Barre  CC:  Physical/aj.  History of Present Illness: here for regular wellness examination.  He's feeling pretty well in general, and does not drink alcohol.   Current Medications (verified): 1)  Lovaza 1 Gm Caps (Omega-3-Acid Ethyl Esters) .Marland Kitchen.. 1 Tab Bid 2)  Lipitor 80 Mg Tabs (Atorvastatin Calcium) .Marland Kitchen.. 1 Daily 3)  Loperamide Hcl 2 Mg Caps (Loperamide Hcl) .... 2 Daily 4)  Allopurinol 100 Mg Tabs (Allopurinol) .Marland Kitchen.. 1 Daily 5)  Bd Insulin Syringe Ultrafine 31g X 5/16" 0.5 Ml Misc (Insulin Syringe-Needle U-100) .Marland Kitchen.. 1cc Use As Directed 6)  Humalog Kwikpen 100 Unit/ml Soln (Insulin Lispro (Human)) .... Qid (Just Before Each Meal) 20-40-45-10 Units 7)  Furosemide 80 Mg Tabs (Furosemide) .... 2 Tabs By Mouth in Am 2 Tabs By Mouth in Pm 8)  Kayexalate  Powd (Sodium Polystyrene Sulfonate) .... 2 Oz Daily 9)  Amlodipine Besylate 10 Mg Tabs (Amlodipine Besylate) .... Take One Tablet By Mouth Daily 10)  Fenofibrate Micronized 134 Mg Caps (Fenofibrate Micronized) .Marland Kitchen.. 1 Qd 11)  Onetouch Ultra Test  Strp (Glucose Blood) .... Two Times A Day, and Lancets 250.00 12)  Calcitriol 0.25 Mcg Caps (Calcitriol) .Marland Kitchen.. 1 Tab By Mouth Sun, T, Th, Sat 2 Tabs By Mouth M W F 13)  Sodium Bicarbonate 650 Mg Tabs (Sodium Bicarbonate) .... Two Times A Day 14)  Aranesp (Albumin Free) 150 Mcg/0.100ml Soln (Darbepoetin Alfa-Polysorbate) .... Every 2 Weeks 15)  Prilosec 20 Mg  Cpdr (Omeprazole) .... At Bedtime 16)  Coumadin 5 Mg Tabs (Warfarin Sodium) .... Take As Directed 17)  Eliphos 667 Mg Tabs (Calcium Acetate (Phos Binder)) .... 2 Tablets By Mouth At Each Meal 18)  Calcitriol 0.5 Mcg Caps (Calcitriol) .Marland Kitchen.. 1 Capsule By Mouth Once Daily 19)  Carvedilol 12.5 Mg Tabs (Carvedilol) .Marland Kitchen.. 1 Tablet By Mouth Once Daily  Allergies (verified): No Known Drug Allergies  Family History: Reviewed history from 08/19/2008 and no changes required. DM - both grandmothers and father mother with GBMF - died 96yo father died age 57 yo with aneurysm  Social History: Reviewed history from 01/02/2010 and no changes required. Current Smoker (<1/2 PPD) Married 3 children Alcohol use-no occasional marijuana work - sales rep - [employer]  Review of Systems  The patient denies fever, vision loss, decreased hearing, chest pain, syncope, dyspnea on exertion, prolonged cough, headaches, abdominal pain, melena, hematochezia, severe indigestion/heartburn, hematuria, suspicious skin lesions, and depression.         he had fatigue.  he has polyuria due to lasix  Physical Exam  General:  normal appearance.   Head:  head: no deformity eyes: no periorbital swelling, no proptosis external nose and ears are normal mouth: no lesion seen Neck:  Supple without thyroid enlargement or tenderness.  Lungs:  Clear to auscultation bilaterally. Normal respiratory effort.  Heart:  Regular rate and rhythm without murmurs or gallops noted. Normal S1,S2.   Abdomen:  abdomen is soft, nontender.  no hepatosplenomegaly.   not distended.  no hernia  Rectal:  sees urology  Prostate:  sees urology  Msk:  muscle bulk and strength are grossly normal.  no obvious joint swelling.  gait is normal and steady  Extremities:  there is a healed av shunt at the left elbow.   Neurologic:  cn 2-12 grossly intact.   readily moves all 4's.    Skin:  normal texture and temp.  no rash.  not  diaphoretic  Cervical Nodes:  No significant adenopathy.  Psych:  Alert and cooperative; normal mood and affect; normal attention span and concentration.   Additional Exam:  SEPARATE EVALUATION FOLLOWS--EACH PROBLEM HERE IS NEW, NOT RESPONDING TO TREATMENT, OR POSES SIGNIFICANT RISK TO THE PATIENT'S HEALTH: HISTORY OF THE PRESENT ILLNESS: no cbg record, but states cbg's are well-controlled.  he says he never misses the insulin.  he has weight gain PAST MEDICAL HISTORY reviewed and up to date today. REVIEW OF SYSTEMS: he seldom has hypoglycemia,and these episodes are mild. PHYSICAL EXAMINATION: feet: no deformity.  there is a plantar ulcer on the right foot (sees wound center).  feet are of normal color and temp.  there is a healed surgical scar at the right foot.  there is bilat 2+ edema, and bilat onychomycosis. neurol:  sensation is intact to touch on the feet, but severely reduced from normal. pulses:  dorsalis pedis intact bilat.  no carotid bruit LAB/XRAY RESULTS: a1c=9.0 Fructosamine         [H]  415 umol/L  (converts to a1c of 8.6) IMPRESSION: dm:  worse, for uncertain reason.  only difference is aranesp, but, if any effect, this reduces rather than increases a1c PLAN: see instruction sheet   Impression & Recommendations:  Problem # 1:  PREVENTIVE HEALTH CARE (ICD-V70.0)  Medications Added to Medication List This Visit: 1)  Humalog Kwikpen 100 Unit/ml Soln (Insulin lispro (human)) .... Qid (just before each meal) 25-45-50-10 units 2)  Calcitriol 0.5 Mcg Caps (Calcitriol) .Marland Kitchen.. 1 capsule by mouth once daily 3)  Carvedilol 12.5 Mg Tabs (Carvedilol) .Marland Kitchen.. 1 tablet by mouth once daily  Other Orders: T-Fructosamine ([id number]) Gastroenterology Referral (GI) Est. Patient Level III (09811) Est. Patient 40-64 years (91478)  Patient Instructions: 1)  blood tests are being ordered for you today.  please call [phone] to hear your test results. 2)  please consider these measures  for your health:  minimize alcohol.  do not use tobacco products.  have a colonoscopy at least every 10 years from age 85.  keep firearms safely stored.  always use seat belts.  have working smoke alarms in your home.  see an eye doctor and dentist regularly.  never drive under the influence of alcohol or drugs (including prescription drugs).  those with fair skin should take precautions against the sun. 3)  please let me know what your wishes would be, if artificial life support measures should become necessary.  it is critically important to prevent falling down (keep floor areas well-lit, dry, and free of loose objects). 4)  Please schedule a follow-up appointment in 3 months. 5)  refer gastroenterology, to consider colonoscopy.  you will be called with a day and time for an appointment. 6)  (update:  we discussed code status.  pt requests full code, but would not want  to be started or maintained on artificial life-support measures if there was not a reasonable chance of recovery). 7)  (update: i left message on phone-tree:  increase qac humalog to 25-45-50 units.  ret 6 weeks).   Orders Added: 1)  T-Fructosamine [[id number]] 2)  Gastroenterology Referral [GI] 3)  Est. Patient Level III [09811] 4)  Est. Patient 40-64 years [99396]   Immunization History:  Influenza Immunization History:    Influenza:  historical (03/14/2010)   Immunization History:  Influenza Immunization History:    Influenza:  Historical (03/14/2010)

## 2010-07-14 NOTE — Medication Information (Signed)
Summary: rov/sp  Anticoagulant Therapy  Managed by: Bethena Midget, RN, BSN Referring MD: Sherryl Manges MD PCP: Oliver Barre Supervising MD: Riley Kill MD, Maisie Fus Indication 1: Atrial Fibrillation (ICD-427.31) Lab Used: [facility] Site: [facility] INR POC 1.3 INR RANGE 2.0-3.0  Dietary changes: no    Health status changes: no    Bleeding/hemorrhagic complications: no    Recent/future hospitalizations: no    Any changes in medication regimen? yes       Details: Renigel replaced with Eliphos   Recent/future dental: no  Any missed doses?: no       Is patient compliant with meds? yes      Comments: Has been hold from last Thursday. Seeing Dr Johney Frame today.   Allergies: No Known Drug Allergies  Anticoagulation Management History:      The patient is taking warfarin and comes in today for a routine follow up visit.  Positive risk factors for bleeding include presence of serious comorbidities.  Negative risk factors for bleeding include an age less than 51 years old.  The bleeding index is 'intermediate risk'.  Positive CHADS2 values include History of HTN and History of Diabetes.  Negative CHADS2 values include Age > 27 years old.  The start date was 01/31/2007.  His last INR was 9.7 ratio.  Anticoagulation responsible provider: Riley Kill MD, Maisie Fus.  INR POC: 1.3.  Cuvette Lot#: [id number].  Exp: 01/2011.    Anticoagulation Management Assessment/Plan:      The patient's current anticoagulation dose is Coumadin 5 mg tabs: take as directed.  The target INR is 2.0-3.0.  The next INR is due 11/20/2009.  Anticoagulation instructions were given to patient.  Results were reviewed/authorized by Bethena Midget, RN, BSN.  He was notified by Bethena Midget, RN, BSN.         Prior Anticoagulation Instructions: INR >8.0  Do NOT take coumadin until we can call you with lab results and give you further instructions.   Results from lab 9.7 Spoke with pt.  Hold Coumadin until next appt on Tuesday.  Pt  instructed to go to ER with any signs of bleeding. Weston Brass PharmD  Nov 06, 2009 4:54 PM      Current Anticoagulation Instructions: INR 1.3 Today take 7.5mg s then resume 5mg s daily.Recheck in one week.

## 2010-07-14 NOTE — Medication Information (Signed)
Summary: rov/sp  Anticoagulant Therapy  Managed by: Cloyde Reams, RN, BSN Referring MD: Sherryl Manges MD PCP: Oliver Barre Supervising MD: Graciela Husbands MD, Viviann Spare Indication 1: Atrial Fibrillation (ICD-427.31) Lab Used: [facility] Site: [facility] INR POC 2.6 INR RANGE 2.0-3.0  Dietary changes: no    Health status changes: no    Bleeding/hemorrhagic complications: no    Recent/future hospitalizations: no    Any changes in medication regimen? no    Recent/future dental: no  Any missed doses?: yes     Details: held coumadin x 4 days then started 1/2 tablet daily.  Is patient compliant with meds? yes      Comments: Pt going to beach x 1 week leaving Sunday, unable to return until gets back from beach.  Allergies: No Known Drug Allergies  Anticoagulation Management History:      The patient is taking warfarin and comes in today for a routine follow up visit.  Positive risk factors for bleeding include presence of serious comorbidities.  Negative risk factors for bleeding include an age less than 8 years old.  The bleeding index is 'intermediate risk'.  Positive CHADS2 values include History of HTN and History of Diabetes.  Negative CHADS2 values include Age > 42 years old.  The start date was 01/31/2007.  His last INR was 6.7.  Anticoagulation responsible provider: Graciela Husbands MD, Viviann Spare.  INR POC: 2.6.  Cuvette Lot#: [id number].  Exp: 02/2011.    Anticoagulation Management Assessment/Plan:      The patient's current anticoagulation dose is Coumadin 5 mg tabs: take as directed.  The target INR is 2.0-3.0.  The next INR is due 12/29/2009.  Anticoagulation instructions were given to patient.  Results were reviewed/authorized by Cloyde Reams, RN, BSN.  He was notified by Cloyde Reams RN.         Prior Anticoagulation Instructions: INR 6.7  Hold Coumadin x 3 days then decrease dose to 1/2 tablet every day.   Current Anticoagulation Instructions: INR 2.6  Continue on same dosage 1/2  tablet daily.  Recheck in 1 week.

## 2010-07-14 NOTE — Progress Notes (Signed)
Summary: surgical clearance-JA   Phone Note From Other Clinic   Caller: nurse elizabeth brim Summary of Call: Per Lanora Manis pt needs clearance for renal transplant. according to [facility] results pt needs echo to eval ef and wall motion. pt had echo done at [facility] on 12/18/09. will fax over report for Dr to review. please review and send clearance form to fax  [phone] ofc [phone]  Initial call taken by: Edman Circle,  January 08, 2010 2:04 PM  Follow-up for Phone Call        spoke with nursue let her know myoview not normal and Dr Johney Frame will have to look at both the [facility] and  echo from [facility].  I will call her after Dr Johney Frame gives me his recomendation Dennis Bast, RN, BSN  January 08, 2010 3:49 PM Follow-up by: Hillis Range, MD,  January 12, 2010 9:41 AM  Additional Follow-up for Phone Call Additional follow up Details #1::        No evidence of ischemia.  I have reviewed echo from [facility] (7/11) and also [facility] (07/10/09) which both reveal EF 45%.  I have also reviewed cath from 2009 which reveals mild nonobstructive coronary artery disease with 30 and 20% stenoses in the proximal left anterior descending, no significant obstruction in the circumflex artery, and 70% narrowing in the small nondominant right coronary artery.  I would therefore prefer to manage medically, without repeat cath, particularly given severe renal impairment. Given his multiple comorbidities including EF 45%, nonobstructive CAD, renal failure, and diabetes, he would be at least moderate risk for surgery.  He is not a good candidate for beta blocker therapy given his Mobitz I second degree AV block.  Based on his myoview, his risk would not be significantly reduced with further cardiac testing at this time. Proceed to surgery if medical necessary with appropriate precautions. Additional Follow-up by: Hillis Range, MD,  January 12, 2010 9:41 AM

## 2010-07-14 NOTE — Progress Notes (Signed)
  Phone Note Refill Request Message from:  Fax from Pharmacy  Refills Requested: Medication #1:  LOVAZA 1 GM CAPS 1 tab bid   Dosage confirmed as above?Dosage Confirmed  Method Requested: Fax to Mail Away Pharmacy    Prescriptions: LOVAZA 1 GM CAPS (OMEGA-3-ACID ETHYL ESTERS) 1 tab bid  #60 x 2   Entered by:   Brenton Grills MA   Authorized by:   Minus Breeding MD   Signed by:   Brenton Grills MA on 01/30/2010   Method used:   Faxed to ...       [facility] (mail-order)       [address] [place], [place]  [address]       Ph: [phone]       Fax: [phone]   RxID:   [id number]

## 2010-07-14 NOTE — Letter (Signed)
Summary: [facility]  [facility]   Imported By: Lester [place] 02/26/2010 10:17:42  _____________________________________________________________________  External Attachment:    Type:   Image     Comment:   External Document

## 2010-07-14 NOTE — Assessment & Plan Note (Signed)
Summary: f/u appt/cd   Vital Signs:  Patient profile:   60 year old male Height:      73 inches (185.42 cm) Weight:      225 pounds (102.27 kg) BMI:     29.79 O2 Sat:      96 % on Room air Temp:     98.0 degrees F (36.67 degrees C) oral Pulse rate:   114 / minute BP sitting:   134 / 80  (left arm) Cuff size:   large  Vitals Entered By: Brenton Grills MA (December 29, 2009 10:29 AM)  O2 Flow:  Room air CC: F/U appt/Pt is no longer taking Renagel or Iron tablets/aj   Referring Provider:  Oliver Barre Primary Provider:  Oliver Barre  CC:  F/U appt/Pt is no longer taking Renagel or Iron tablets/aj.  History of Present Illness: the status of at least 3 ongoing medical problems is addressed today: dm:  no cbg record, but states cbg's vary from 92-160.  it is not higher later in the day.  he seldom takes the lantus.   dyslipidemia:  he takes and tolerates the meds well. foor ulcer:  dr [name] has rx'ed oxycodone.  however, he seldom takes, because of numbness of the right foot. renal failure:  he will start dialysis soon  Current Medications (verified): 1)  Lovaza 1 Gm Caps (Omega-3-Acid Ethyl Esters) .Marland Kitchen.. 1 Tab Bid 2)  Lipitor 80 Mg Tabs (Atorvastatin Calcium) .Marland Kitchen.. 1 Daily 3)  Loperamide Hcl 2 Mg Caps (Loperamide Hcl) .... 2 Daily 4)  Allopurinol 100 Mg Tabs (Allopurinol) .Marland Kitchen.. 1 Daily 5)  Bd Insulin Syringe Ultrafine 31g X 5/16" 0.5 Ml Misc (Insulin Syringe-Needle U-100) .Marland Kitchen.. 1cc Use As Directed 6)  Humalog Kwikpen 100 Unit/ml Soln (Insulin Lispro (Human)) .... Qid (Just Before Each Meal) 20-40-45-10 Units 7)  Furosemide 80 Mg Tabs (Furosemide) .... 2 Tabs By Mouth in Am 2 Tabs By Mouth in Pm 8)  Kayexalate  Powd (Sodium Polystyrene Sulfonate) .... 2 Oz Daily 9)  Lantus 100 Unit/ml Soln (Insulin Glargine) .... 40 Units At Bedtime 10)  Amlodipine Besylate 10 Mg Tabs (Amlodipine Besylate) .... Take One Tablet By Mouth Daily 11)  Fenofibrate Micronized 134 Mg Caps (Fenofibrate Micronized)  .Marland Kitchen.. 1 Qd 12)  Onetouch Ultra Test  Strp (Glucose Blood) .... Two Times A Day, and Lancets 250.00 13)  Oxycodone Hcl 5 Mg Tabs (Oxycodone Hcl) .... As Needed 14)  Calcitriol 0.25 Mcg Caps (Calcitriol) .Marland Kitchen.. 1 Tab By Mouth Sun, T, Th, Sat 2 Tabs By Mouth M W F 15)  Renagel 800 Mg Tabs (Sevelamer Hcl) .... Three Times A Day 16)  Sodium Bicarbonate 650 Mg Tabs (Sodium Bicarbonate) .... Two Times A Day 17)  Ferrous Sulfate 325 (65 Fe) Mg Tbec (Ferrous Sulfate) .... Two Times A Day 18)  Aranesp (Albumin Free) 150 Mcg/0.73ml Soln (Darbepoetin Alfa-Polysorbate) .... Every 2 Weeks 19)  Prilosec 20 Mg Cpdr (Omeprazole) .... At Bedtime 20)  Coumadin 5 Mg Tabs (Warfarin Sodium) .... Take As Directed 21)  Eliphos 667 Mg Tabs (Calcium Acetate (Phos Binder)) .... 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         denies hypoglycemia  Physical Exam  General:  obese.  insulin injection sites at anterior abdomen are normal. Skin:  insulin injection sites at anterior abdomen are normal, except for a few ecchymoses. Additional Exam:  Hemoglobin A1C       [H]  7.0 %                        4.6-6.5 Cholesterol               99 mg/dL                    4-782 Triglycerides             111.0 mg/dL                 9.5-621.3 HDL                  [L]  08.65 mg/dL                 >78.46 LDL Cholesterol           48 mg/dL       Impression & Recommendations:  Problem # 1:  DIABETES MELLITUS, TYPE II (ICD-250.00) well-controlled  Problem # 2:  HYPERLIPIDEMIA (ICD-272.4) well-controlled  Problem # 3:  RENAL INSUFFICIENCY (ICD-588.9) this reduces insulin requirement  Problem # 4:  neuropathy this minimizes the need for oxycodone  Medications Added to Medication List This Visit: 1)  Loperamide Hcl 2 Mg Caps (Loperamide hcl) .... 2 daily 2)  Furosemide 80 Mg Tabs (Furosemide) .... 2 tabs by mouth in am 2 tabs by mouth in pm 3)  Calcitriol 0.25 Mcg Caps (Calcitriol) .Marland Kitchen.. 1 tab by mouth sun, t, th, sat 2 tabs by mouth m w f 4)  Eliphos 667 Mg Tabs (Calcium acetate (phos binder)) .... 2 tablets by mouth at each meal  Other Orders: TLB-A1C / Hgb A1C (Glycohemoglobin) (83036-A1C) TLB-Lipid Panel (80061-LIPID) Est. Patient Level IV (96295)  Patient Instructions: 1)  check your blood sugar 2 times a day.  vary the time of day when you check, between before the 3 meals, and at bedtime.  also check if you have symptoms of your blood sugar being too high or too low.  please keep a record of the readings and bring it to your next appointment here.  please call us sooner if you are having low blood sugar episodes. 2)  blood tests are being ordered for you today.  please call [phone] to hear your test results. 3)  pending the test results, please stop the lantus. 4)  Please schedule a physical appointment in 3 months. 5)  (update: i left message on phone-tree:  rx as we discussed)

## 2010-07-14 NOTE — Medication Information (Signed)
Summary: rov/eh  Anticoagulant Therapy  Managed by: Cloyde Reams, RN, BSN Referring MD: Sherryl Manges MD PCP: Oliver Barre Supervising MD: Graciela Husbands MD, Viviann Spare Indication 1: Atrial Fibrillation (ICD-427.31) Lab Used: [facility] Site: [facility] INR POC 4.2 INR RANGE 2.0-3.0          Comments: Pt discharged from hospital 07/13/08.  Pt states taken off coumadin 5-6 days in hospital, place on coumadin.  Took 5mg  yesterday, took 2.5mg  today.  INR 2.91on 07/13/09 at discharge.    Allergies: No Known Drug Allergies  Anticoagulation Management History:      The patient is taking warfarin and comes in today for a routine follow up visit.  Positive risk factors for bleeding include presence of serious comorbidities.  Negative risk factors for bleeding include an age less than 1 years old.  The bleeding index is 'intermediate risk'.  Positive CHADS2 values include History of HTN and History of Diabetes.  Negative CHADS2 values include Age > 49 years old.  The start date was 01/31/2007.  His last INR was 2.4.  Anticoagulation responsible provider: Graciela Husbands MD, Viviann Spare.  INR POC: 4.2.  Cuvette Lot#: [id number].  Exp: 09/2010.    Anticoagulation Management Assessment/Plan:      The patient's current anticoagulation dose is Coumadin 5 mg tabs: Use as directed by anticoagulation clinic..  The target INR is 2.0-3.0.  The next INR is due 07/24/2009.  Anticoagulation instructions were given to patient.  Results were reviewed/authorized by Cloyde Reams, RN, BSN.  He was notified by Cloyde Reams RN.         Prior Anticoagulation Instructions: INR 3.4 Tuesday take 2.5mg s then change dose to 5mg s daily except 2.5mg s on Tuesdays. Recheck in one week.  Current Anticoagulation Instructions: INR 4.2  Skip 1 day then 5 mg daily except 2.5 mg Tuesdays.    Recheck in 10 days.   Prescriptions: COUMADIN 5 MG TABS (WARFARIN SODIUM) Use as directed by anticoagulation clinic.  #30 x 1   Entered by:   Cloyde Reams RN   Authorized by:   Hillis Range, MD   Signed by:   Cloyde Reams RN on 07/15/2009   Method used:   Electronically to        [facility] (retail)       [address]       [place], [place]  [address]       Ph: [phone] or [phone]       Fax: [phone]   RxID:   [id number]

## 2010-07-14 NOTE — Letter (Signed)
Summary: [employer] [place]   Imported By: Roderic Ovens 01/15/2010 15:42:22  _____________________________________________________________________  External Attachment:    Type:   Image     Comment:   External Document

## 2010-07-14 NOTE — Progress Notes (Signed)
Summary: Nuclear Pre-Procedure  Phone Note Outgoing Call Call back at [facility] Phone [phone]   Call placed by: Stanton Kidney, EMT-P,  January 06, 2010 2:25 PM Call placed to: Patient Action Taken: Phone Call Completed Summary of Call: Reviewed information on [employer] Information Sheet (see scanned document for further details).  Spoke with Patient.    Nuclear Med Background Indications for Stress Test: Evaluation for Ischemia   History: Ablation   Symptoms: Fatigue, Palpitations    Nuclear Pre-Procedure Cardiac Risk Factors: Hypertension, IDDM Type 2, Lipids, Smoker Height (in): 73  Nuclear Med Study Referring MD:  Sherryl Manges MD

## 2010-07-14 NOTE — Miscellaneous (Signed)
Summary: [facility]  [facility] Orders   Imported By: Roderic Ovens 10/22/2009 11:12:27  _____________________________________________________________________  External Attachment:    Type:   Image     Comment:   External Document

## 2010-07-14 NOTE — Medication Information (Signed)
Summary: rov/tm  Anticoagulant Therapy  Managed by: Cloyde Reams, RN, BSN Referring MD: Hillis Range, MD PCP: Oliver Barre Supervising MD: Riley Kill MD, Maisie Fus Indication 1: Atrial Fibrillation (ICD-427.31) Lab Used: [facility] Site: [facility] INR POC 3.1 INR RANGE 2.0-3.0  Dietary changes: no    Health status changes: no    Bleeding/hemorrhagic complications: no    Recent/future hospitalizations: no    Any changes in medication regimen? no    Recent/future dental: no  Any missed doses?: no       Is patient compliant with meds? yes       Allergies: No Known Drug Allergies  Anticoagulation Management History:      The patient is taking warfarin and comes in today for a routine follow up visit.  Positive risk factors for bleeding include presence of serious comorbidities.  Negative risk factors for bleeding include an age less than 29 years old.  The bleeding index is 'intermediate risk'.  Positive CHADS2 values include History of HTN and History of Diabetes.  Negative CHADS2 values include Age > 63 years old.  The start date was 01/31/2007.  His last INR was 6.7.  Anticoagulation responsible provider: Riley Kill MD, Maisie Fus.  INR POC: 3.1.  Cuvette Lot#: [id number].  Exp: 03/2011.    Anticoagulation Management Assessment/Plan:      The patient's current anticoagulation dose is Coumadin 5 mg tabs: take as directed.  The target INR is 2.0-3.0.  The next INR is due 02/23/2010.  Anticoagulation instructions were given to patient.  Results were reviewed/authorized by Cloyde Reams, RN, BSN.  He was notified by Cloyde Reams RN.         Prior Anticoagulation Instructions: INR 1.9 Change dose to 1/2 pill everyday except 1 pill on Mondays and Fridays. Recheck in 2 weeks.   Current Anticoagulation Instructions: INR 3.1  Start taking 1/2 tablet daily except 1 tablet on Fridays.  Recheck in 2 weeks.

## 2010-07-14 NOTE — Medication Information (Signed)
Summary: Coumadin Clinic  Anticoagulant Therapy  Managed by: Bethena Midget, RN, BSN Referring MD: Sherryl Manges MD PCP: Oliver Barre Supervising MD: Eden Emms MD, Theron Arista Indication 1: Atrial Fibrillation (ICD-427.31) Lab Used: [facility] Site: [facility] PT 71.5 INR POC 8.78 INR RANGE 2.0-3.0  Dietary changes: no    Health status changes: no    Bleeding/hemorrhagic complications: no    Recent/future hospitalizations: no    Any changes in medication regimen? no    Recent/future dental: no  Any missed doses?: no       Is patient compliant with meds? yes      Comments: lab draw from [facility] - 1445 - lmom for pt no to take any coumadin and call office  Allergies: No Known Drug Allergies  Anticoagulation Management History:      His anticoagulation is being managed by telephone today.  Positive risk factors for bleeding include presence of serious comorbidities.  Negative risk factors for bleeding include an age less than 58 years old.  The bleeding index is 'intermediate risk'.  Positive CHADS2 values include History of HTN and History of Diabetes.  Negative CHADS2 values include Age > 20 years old.  The start date was 01/31/2007.  His last INR was 2.4.  Prothrombin time is 71.5.  Anticoagulation responsible provider: Eden Emms MD, Theron Arista.  INR POC: 8.78.    Anticoagulation Management Assessment/Plan:      The patient's current anticoagulation dose is Coumadin 5 mg tabs: take as directed.  The target INR is 2.0-3.0.  The next INR is due 10/17/2009.  Anticoagulation instructions were given to patient.  Results were reviewed/authorized by Bethena Midget, RN, BSN.  He was notified by Bethena Midget, RN, BSN.         Prior Anticoagulation Instructions: INR 1.81  Called spoke with pt instructed to take an extra 1/2 tablet today, then start taking 5mg  daily except 7.5mg  on Wednesdays.  Recheck PT/INR on 10/13/09 Monday with Vancomycin levels.  Called spoke with Tresa Endo, [facility] gave verbal orders to recheck  in 1 week.    Current Anticoagulation Instructions: INR 8.78 Pt took yesterday's dose.  Skip today's dose and tomorrow's dose and orders given to [facility] with  [facility] to recheck on Friday per Dr. Weston Brass, Pharm D.

## 2010-07-14 NOTE — Letter (Signed)
Summary: New Patient letter  [facility]  [address] [place], [place] [address]   Phone: [phone]  Fax: [phone]       04/16/2010 MRN: [id number]  [name] [address] [place], [place]  [address]  Dear Mr. Hasler,  Welcome to the Gastroenterology Division at [facility].    You are scheduled to see Dr. Marina Goodell on 06/17/2010 at 11:00AM on the 3rd floor at [facility], [address]  We ask that you try to arrive at our office 15 minutes prior to your appointment time to allow for check-in.  We would like you to complete the enclosed self-administered evaluation form prior to your visit and bring it with you on the day of your appointment.  We will review it with you.  Also, please bring a complete list of all your medications or, if you prefer, bring the medication bottles and we will list them.  Please bring your insurance card so that we may make a copy of it.  If your insurance requires a referral to see a specialist, please bring your referral form from your primary care physician.  Co-payments are due at the time of your visit and may be paid by cash, check or credit card.     Your office visit will consist of a consult with your physician (includes a physical exam), any laboratory testing he/she may order, scheduling of any necessary diagnostic testing (e.g. x-ray, ultrasound, CT-scan), and scheduling of a procedure (e.g. Endoscopy, Colonoscopy) if required.  Please allow enough time on your schedule to allow for any/all of these possibilities.    If you cannot keep your appointment, please call [phone] to cancel or reschedule prior to your appointment date.  This allows [place] the opportunity to schedule an appointment for another patient in need of care.  If you do not cancel or reschedule by 5 p.m. the business day prior to your appointment date, you will be charged a $50.00 late cancellation/no-show fee.    Thank you for choosing  [facility] for your medical needs.  We appreciate the opportunity to care for you.  Please visit [place] at our website  to learn more about our practice.                     Sincerely,                                                             The Gastroenterology Division

## 2010-07-14 NOTE — Progress Notes (Signed)
Summary: pt has flutter and elevated heart rate   Phone Note Call from Patient Call back at [phone]   Caller: Patient Reason for Call: Talk to Nurse, Talk to Doctor Summary of Call: per pt call his heart is starting to flutter and rapid heart rate and first avail is march for Dr, Graciela Husbands and he wants to be seen sooner due to this fluttering starting this weekend/lg Initial call taken by: [name],  July 07, 2009 10:22 AM  Follow-up for Phone Call        Pt is starting and stopping Digoxin himself. He had renal disease and was told by Dr. Briant Cedar to stop it 6-8 months ago. He started it back himself after he started feeling like he was having flutter about a weeks ago at 0.25mg . His HR at the doctor's office a week ago was 128-130. After s/w Dr. Graciela Husbands, he is to increase his Metoprolol Succ. to 100mg  daily. After relaying this to the pt., he states that he did that in Nov (we do not have a record of it, he has not been seen here since 1/09).  I s/w Dr. Graciela Husbands and our schedule is full for a while. Pt will see Dr. Ladona Ridgel today. Pt aware and will be here today.  Follow-up by: Duncan Dull, RN, BSN,  July 07, 2009 12:00 PM

## 2010-07-14 NOTE — Progress Notes (Signed)
  Phone Note Refill Request Message from:  Fax from Pharmacy on July 22, 2009 3:29 PM  Refills Requested: Medication #1:  BD INSULIN SYRINGE ULTRAFINE 31G X 5/16" 0.5 ML MISC 1cc use as directed   Dosage confirmed as above?Dosage Confirmed Initial call taken by: Josph Macho CMA,  July 22, 2009 3:30 PM    Prescriptions: BD INSULIN SYRINGE ULTRAFINE 31G X 5/16" 0.5 ML MISC (INSULIN SYRINGE-NEEDLE U-100) 1cc use as directed  #100 x 1   Entered by:   Josph Macho CMA   Authorized by:   Minus Breeding MD   Signed by:   Josph Macho CMA on 07/22/2009   Method used:   Electronically to        [facility] (retail)       [address]       [place], [place]  [id number]       Ph: [phone] or [phone]       Fax: [phone]   RxID:   [id number]

## 2010-07-14 NOTE — Progress Notes (Signed)
  Phone Note Refill Request Message from:  Fax from Pharmacy on Oct 28, 2009 9:51 AM  Refills Requested: Medication #1:  PRILOSEC 20 MG CPDR at bedtime   Dosage confirmed as above?Dosage Confirmed Initial call taken by: Josph Macho RMA,  Oct 28, 2009 9:51 AM    Prescriptions: PRILOSEC 20 MG CPDR (OMEPRAZOLE) at bedtime  #30 x 4   Entered by:   Josph Macho RMA   Authorized by:   Minus Breeding MD   Signed by:   Josph Macho RMA on 10/28/2009   Method used:   Electronically to        [facility] (retail)       [address]       [place], [place]  [address]       Ph: [phone] or [phone]       Fax: [phone]   RxID:   [id number]

## 2010-07-14 NOTE — Progress Notes (Signed)
Summary: wound care  Phone Note Call from Patient Call back at Home Phone [phone]   Caller: Patient [phone] Summary of Call: pt called stating that he has a malodorous infected wound on the bottom of his foot. pt is requesting a referral to wound care Initial call taken by: Margaret Pyle, CMA,  September 15, 2009 9:31 AM  Follow-up for Phone Call        please come in for ov today, as this probably can't wait for wound care referral. Follow-up by: Minus Breeding MD,  September 15, 2009 12:29 PM  Additional Follow-up for Phone Call Additional follow up Details #1::        pt informed and transferred to sch appt Additional Follow-up by: Margaret Pyle, CMA,  September 15, 2009 12:56 PM

## 2010-07-14 NOTE — Medication Information (Signed)
Summary: rov/sp      Allergies Added: NKDA Anticoagulant Therapy  Managed by: Reina Fuse, PharmD Referring MD: Hillis Range, MD PCP: Oliver Barre Supervising MD: Eden Emms MD, Theron Arista Indication 1: Atrial Fibrillation (ICD-427.31) Lab Used: [facility] Site: [facility] INR POC 1.7 INR RANGE 2.0-3.0  Dietary changes: yes       Details: large mixed green salad last night  Health status changes: no    Bleeding/hemorrhagic complications: no    Recent/future hospitalizations: no    Any changes in medication regimen? no    Recent/future dental: no  Any missed doses?: no       Is patient compliant with meds? yes       Current Medications (verified): 1)  Lovaza 1 Gm Caps (Omega-3-Acid Ethyl Esters) .Marland Kitchen.. 1 Tab Bid 2)  Lipitor 80 Mg Tabs (Atorvastatin Calcium) .Marland Kitchen.. 1 Daily 3)  Loperamide Hcl 2 Mg Caps (Loperamide Hcl) .... 2 Daily 4)  Allopurinol 100 Mg Tabs (Allopurinol) .Marland Kitchen.. 1 Daily 5)  Bd Insulin Syringe Ultrafine 31g X 5/16" 0.5 Ml Misc (Insulin Syringe-Needle U-100) .Marland Kitchen.. 1cc Use As Directed 6)  Humalog Kwikpen 100 Unit/ml Soln (Insulin Lispro (Human)) .... Qid (Just Before Each Meal) 20-40-45-10 Units 7)  Furosemide 80 Mg Tabs (Furosemide) .... 2 Tabs By Mouth in Am 2 Tabs By Mouth in Pm 8)  Kayexalate  Powd (Sodium Polystyrene Sulfonate) .... 2 Oz Daily 9)  Amlodipine Besylate 10 Mg Tabs (Amlodipine Besylate) .... Take One Tablet By Mouth Daily 10)  Fenofibrate Micronized 134 Mg Caps (Fenofibrate Micronized) .Marland Kitchen.. 1 Qd 11)  Onetouch Ultra Test  Strp (Glucose Blood) .... Two Times A Day, and Lancets 250.00 12)  Calcitriol 0.25 Mcg Caps (Calcitriol) .Marland Kitchen.. 1 Tab By Mouth Sun, T, Th, Sat 2 Tabs By Mouth M W F 13)  Sodium Bicarbonate 650 Mg Tabs (Sodium Bicarbonate) .... Two Times A Day 14)  Aranesp (Albumin Free) 150 Mcg/0.77ml Soln (Darbepoetin Alfa-Polysorbate) .... Every 2 Weeks 15)  Prilosec 20 Mg Cpdr (Omeprazole) .... At Bedtime 16)  Coumadin 5 Mg Tabs (Warfarin Sodium) ....  Take As Directed 17)  Eliphos 667 Mg Tabs (Calcium Acetate (Phos Binder)) .... 2 Tablets By Mouth At Each Meal  Allergies (verified): No Known Drug Allergies  Anticoagulation Management History:      The patient is taking warfarin and comes in today for a routine follow up visit.  Positive risk factors for bleeding include presence of serious comorbidities.  Negative risk factors for bleeding include an age less than 82 years old.  The bleeding index is 'intermediate risk'.  Positive CHADS2 values include History of HTN and History of Diabetes.  Negative CHADS2 values include Age > 18 years old.  The start date was 01/31/2007.  His last INR was 6.7.  Anticoagulation responsible provider: Eden Emms MD, Theron Arista.  INR POC: 1.7.  Cuvette Lot#: [id number].  Exp: 03/2011.    Anticoagulation Management Assessment/Plan:      The patient's current anticoagulation dose is Coumadin 5 mg tabs: take as directed.  The target INR is 2.0-3.0.  The next INR is due 01/26/2010.  Anticoagulation instructions were given to patient.  Results were reviewed/authorized by Reina Fuse, PharmD.  He was notified by Reina Fuse PharmD.         Prior Anticoagulation Instructions: INR 1.9  Take 1 tablet today then resume same dose of 1/2 tablet every day.   Current Anticoagulation Instructions: INR 1.7  Take Coumadin 0.5 tabs all days except Coumadin 1 tab on Mondays.

## 2010-07-14 NOTE — Medication Information (Signed)
Summary: rov/eac      Allergies Added: NKDA Anticoagulant Therapy  Managed by: Rolland Porter, PharmD Referring MD: Sherryl Manges MD PCP: Oliver Barre Supervising MD: Jens Som MD, Arlys John Indication 1: Atrial Fibrillation (ICD-427.31) Lab Used: [facility] Site: [facility] INR POC >8 INR RANGE 2.0-3.0  Dietary changes: no    Health status changes: no    Bleeding/hemorrhagic complications: no    Recent/future hospitalizations: no    Any changes in medication regimen? no    Recent/future dental: no  Any missed doses?: no       Is patient compliant with meds? yes       Current Medications (verified): 1)  Lovaza 1 Gm Caps (Omega-3-Acid Ethyl Esters) .Marland Kitchen.. 1 Tab Bid 2)  Lipitor 80 Mg Tabs (Atorvastatin Calcium) .Marland Kitchen.. 1 Daily 3)  Loperamide Hcl 2 Mg Caps (Loperamide Hcl) .Marland Kitchen.. 1 Daily 4)  Allopurinol 100 Mg Tabs (Allopurinol) .Marland Kitchen.. 1 Daily 5)  Bd Insulin Syringe Ultrafine 31g X 5/16" 0.5 Ml Misc (Insulin Syringe-Needle U-100) .Marland Kitchen.. 1cc Use As Directed 6)  Humalog Kwikpen 100 Unit/ml Soln (Insulin Lispro (Human)) .... Qid (Just Before Each Meal) 20-40-45-10 Units 7)  Furosemide 80 Mg Tabs (Furosemide) .... Take One Tablet By Mouth Daily. 8)  Kayexalate  Powd (Sodium Polystyrene Sulfonate) .... 2 Oz Daily 9)  Lantus 100 Unit/ml Soln (Insulin Glargine) .... 40 Units At Bedtime 10)  Amlodipine Besylate 10 Mg Tabs (Amlodipine Besylate) .... Take One Tablet By Mouth Daily 11)  Fenofibrate Micronized 134 Mg Caps (Fenofibrate Micronized) .Marland Kitchen.. 1 Qd 12)  Onetouch Ultra Test  Strp (Glucose Blood) .... Two Times A Day, and Lancets 250.00 13)  Oxycodone Hcl 5 Mg Tabs (Oxycodone Hcl) .... As Needed 14)  Calcitriol 0.25 Mcg Caps (Calcitriol) .... At Bedtime 15)  Renagel 800 Mg Tabs (Sevelamer Hcl) .... Three Times A Day 16)  Sodium Bicarbonate 650 Mg Tabs (Sodium Bicarbonate) .... Two Times A Day 17)  Ferrous Sulfate 325 (65 Fe) Mg Tbec (Ferrous Sulfate) .... Two Times A Day 18)  Aranesp  (Albumin Free) 150 Mcg/0.88ml Soln (Darbepoetin Alfa-Polysorbate) .... Every 2 Weeks 19)  Prilosec 20 Mg Cpdr (Omeprazole) .... At Bedtime 20)  Coumadin 5 Mg Tabs (Warfarin Sodium) .... Take As Directed 21)  Eliphos 667 Mg Tabs (Calcium Acetate (Phos Binder)) .... Once Daily  Allergies (verified): No Known Drug Allergies  Anticoagulation Management History:      Positive risk factors for bleeding include presence of serious comorbidities.  Negative risk factors for bleeding include an age less than 66 years old.  The bleeding index is 'intermediate risk'.  Positive CHADS2 values include History of HTN and History of Diabetes.  Negative CHADS2 values include Age > 51 years old.  The start date was 01/31/2007.  His last INR was 9.7 ratio.  Anticoagulation responsible provider: Jens Som MD, Arlys John.  INR POC: >8.  Cuvette Lot#: [id number].  Exp: 01/2011.    Anticoagulation Management Assessment/Plan:      The patient's current anticoagulation dose is Coumadin 5 mg tabs: take as directed.  The target INR is 2.0-3.0.  The next INR is due 12/08/2009.  Anticoagulation instructions were given to patient.  Results were reviewed/authorized by Rolland Porter, PharmD.  He was notified by Rolland Porter.         Prior Anticoagulation Instructions: INR 4.1  Do not take coumadin today or tomorrow.  Then start NEW dosing schedule of 1 tablet on Monday, Wednesday and Friday and 1/2 tablet all other days.  Return to clinic  in 1 week.    Current Anticoagulation Instructions: INR >8  Hold Coumadin until further instructions. Pt can not return until next Tuesday.  [phone]  Appended Document: Coumadin Clinic    Anticoagulant Therapy  Managed by: Weston Brass, PharmD Referring MD: Sherryl Manges MD PCP: Oliver Barre Supervising MD: Jens Som MD, Arlys John Indication 1: Atrial Fibrillation (ICD-427.31) Lab Used: [facility]  Site: [facility] PT 72.2 INR POC >8 INR RANGE 2.0-3.0          Comments: Pt sent  to lab after INR >8 on Coagucheck  Allergies: No Known Drug Allergies  Anticoagulation Management History:      The patient is taking warfarin and comes in today for a routine follow up visit.  Positive risk factors for bleeding include presence of serious comorbidities.  Negative risk factors for bleeding include an age less than 60 years old.  The bleeding index is 'intermediate risk'.  Positive CHADS2 values include History of HTN and History of Diabetes.  Negative CHADS2 values include Age > 79 years old.  The start date was 01/31/2007.  His last INR was 9.7 ratio and today's INR is 6.7.  Prothrombin time is 72.2.  Anticoagulation responsible provider: Jens Som MD, Arlys John.  INR POC: >8.  Exp: 01/2011.    Anticoagulation Management Assessment/Plan:      The patient's current anticoagulation dose is Coumadin 5 mg tabs: take as directed.  The target INR is 2.0-3.0.  The next INR is due 12/16/2009.  Anticoagulation instructions were given to patient.  Results were reviewed/authorized by Weston Brass, PharmD.  He was notified by Weston Brass PharmD.         Prior Anticoagulation Instructions: INR >8  Hold Coumadin until further instructions. Pt can not return until next Tuesday.  [phone]  Current Anticoagulation Instructions: INR 6.7  Hold Coumadin x 3 days then decrease dose to 1/2 tablet every day.

## 2010-07-14 NOTE — Medication Information (Signed)
Summary: [facility]  Anticoagulant Therapy  Managed by: Elaina Pattee, PharmD Referring MD: Sherryl Manges MD PCP: Oliver Barre Supervising MD: Juanda Chance MD, Yasmene Salomone Indication 1: Atrial Fibrillation (ICD-427.31) Lab Used: [facility] Site: [facility] PT 70.0 INR POC 6.97 INR RANGE 2.0-3.0  Dietary changes: no    Health status changes: no    Bleeding/hemorrhagic complications: no    Recent/future hospitalizations: no    Any changes in medication regimen? no    Recent/future dental: no  Any missed doses?: no       Is patient compliant with meds? yes      Comments: Pt has already taken Coumadin today.  Allergies: No Known Drug Allergies  Anticoagulation Management History:      His anticoagulation is being managed by telephone today.  Positive risk factors for bleeding include presence of serious comorbidities.  Negative risk factors for bleeding include an age less than 60 years old.  The bleeding index is 'intermediate risk'.  Positive CHADS2 values include History of HTN and History of Diabetes.  Negative CHADS2 values include Age > 60 years old.  The start date was 01/31/2007.  His last INR was 9.7 ratio.  Prothrombin time is 70.0.  Anticoagulation responsible provider: Juanda Chance MD, Smitty Cords.  INR POC: 6.97.  Exp: 01/2011.    Anticoagulation Management Assessment/Plan:      The patient's current anticoagulation dose is Coumadin 5 mg tabs: take as directed.  The target INR is 2.0-3.0.  The next INR is due 11/20/2009.  Anticoagulation instructions were given to patient.  Results were reviewed/authorized by Elaina Pattee, PharmD.  He was notified by Elaina Pattee, PharmD.         Prior Anticoagulation Instructions: INR 1.3 Today take 7.5mg s then resume 5mg s daily.Recheck in one week.   Current Anticoagulation Instructions: INR 6.97.  Hold Coumadin until appointment on Monday at 11:45 with [facility].  Call with problems.  Spoke with pt 16:15 11/19/09 Elaina Pattee, PharmD.

## 2010-07-14 NOTE — Medication Information (Signed)
Summary: rov/cb  Anticoagulant Therapy  Managed by: Weston Brass, PharmD Referring MD: Sherryl Manges MD PCP: Oliver Barre Supervising MD: Antoine Poche MD, Fayrene Fearing Indication 1: Atrial Fibrillation (ICD-427.31) Lab Used: [facility] Site: [facility] INR POC 2.4 INR RANGE 2.0-3.0  Dietary changes: no    Health status changes: no    Bleeding/hemorrhagic complications: no    Recent/future hospitalizations: no    Any changes in medication regimen? no    Recent/future dental: no  Any missed doses?: no       Is patient compliant with meds? yes      Comments: Pt on brand name Coumadin.  ? if he has received some of the recalled lot.  Will give him samples to see if that might have been the issue.  Exp: 06/2011, LOT: [id number]  Allergies: No Known Drug Allergies  Anticoagulation Management History:      The patient is taking warfarin and comes in today for a routine follow up visit.  Positive risk factors for bleeding include presence of serious comorbidities.  Negative risk factors for bleeding include an age less than 59 years old.  The bleeding index is 'intermediate risk'.  Positive CHADS2 values include History of HTN and History of Diabetes.  Negative CHADS2 values include Age > 54 years old.  The start date was 01/31/2007.  His last INR was 9.7 ratio.  Anticoagulation responsible provider: Antoine Poche MD, Fayrene Fearing.  INR POC: 2.4.  Cuvette Lot#: [id number].  Exp: 01/2011.    Anticoagulation Management Assessment/Plan:      The patient's current anticoagulation dose is Coumadin 5 mg tabs: take as directed.  The target INR is 2.0-3.0.  The next INR is due 12/01/2009.  Anticoagulation instructions were given to patient.  Results were reviewed/authorized by Weston Brass, PharmD.  He was notified by Weston Brass PharmD.         Prior Anticoagulation Instructions: INR 6.97.  Hold Coumadin until appointment on Monday at 11:45 with [facility]in Clinic.  Call with problems.  Spoke with pt 16:15 11/19/09 Elaina Pattee, PharmD.  Current Anticoagulation Instructions: INR 2.4  Start new dose of 1 tablet every day except 1/2 tablet on Tuesday, Thursday and Saturday.

## 2010-07-14 NOTE — Progress Notes (Signed)
Summary: pt wants to talk to nurse   Phone Note Call from Patient Call back at Home Phone [phone]   Caller: Patient Reason for Call: Talk to Nurse, Talk to Doctor Summary of Call: pt had to cancel appt for july 13th and he is going to [facility] soon and was wondering if MD wanted to see him prior to him going. Initial call taken by: [name],  November 20, 2009 10:36 AM  Follow-up for Phone Call        Spoke with pt. Patient states he had an appointment with Dr. Johney Frame on July 13th. Pt. had to cancelled due he is out of town that week. Pt. called the scheduler which said  it will be until August before he can be seen. Pt. states he has been in A-fib.  According to pt. Dr. Johney Frame wanted to  seen  him soon. Pt. states is  going  to [facility] for wound Tx. next month.  I let pt. know will send this message to [facility] Dr. Jenel Lucks nurse to see if she can help him. Okay with pt. Ollen Gross, RN, BSN  November 20, 2009 2:26 PM   Additional Follow-up for Phone Call Additional follow up Details #1::        Does not want to come in before his beach trip. Will come in on 7/22/11to see Dr Johney Frame Dennis Bast, RN, BSN  December 03, 2009 9:35 AM

## 2010-07-14 NOTE — Letter (Signed)
Summary: [facility]   Imported By: Sherian Rein 04/02/2010 11:45:38  _____________________________________________________________________  External Attachment:    Type:   Image     Comment:   External Document

## 2010-07-14 NOTE — Letter (Signed)
Summary: [facility]  [facility]   Imported By: Debby Freiberg 12/10/2009 14:32:01  _____________________________________________________________________  External Attachment:    Type:   Image     Comment:   External Document

## 2010-07-14 NOTE — Letter (Signed)
Summary: [facility]   Imported By: Lester Winona 10/28/2009 08:11:32  _____________________________________________________________________  External Attachment:    Type:   Image     Comment:   External Document

## 2010-07-14 NOTE — Medication Information (Signed)
Summary: rov.mlw  Anticoagulant Therapy  Managed by: Bethena Midget, RN, BSN Referring MD: Sherryl Manges MD PCP: Oliver Barre Supervising MD: Juanda Chance MD, Bruce Indication 1: Atrial Fibrillation (ICD-427.31) Lab Used: [facility] Site: [facility] INR POC 2.4 INR RANGE 2.0-3.0  Dietary changes: no    Health status changes: yes       Details: had a cyst removed on chest yesterday  Bleeding/hemorrhagic complications: no    Recent/future hospitalizations: no    Any changes in medication regimen? yes       Details: had 4 pills of vicodin for the past 3 days  Recent/future dental: no  Any missed doses?: yes     Details: missed christmas eve and christmas day doses ( missed 2 days)  Is patient compliant with meds? yes       Allergies (verified): No Known Drug Allergies  Anticoagulation Management History:      The patient is taking warfarin and comes in today for a routine follow up visit.  Positive risk factors for bleeding include presence of serious comorbidities.  Negative risk factors for bleeding include an age less than 5 years old.  The bleeding index is 'intermediate risk'.  Positive CHADS2 values include History of HTN and History of Diabetes.  Negative CHADS2 values include Age > 81 years old.  The start date was 01/31/2007.  His last INR was 2.3 RATIO and today's INR is 2.4.  Anticoagulation responsible provider: Juanda Chance MD, Smitty Cords.  INR POC: 2.4.  Cuvette Lot#: [id number].  Exp: 07/2010.    Anticoagulation Management Assessment/Plan:      The patient's current anticoagulation dose is Coumadin 5 mg tabs: Use as directed by anticoagulation clinic.  **Past due for clinic visit!**.  The target INR is 2.0-3.0.  The next INR is due 07/15/2009.  Anticoagulation instructions were given to patient.  Results were reviewed/authorized by Bethena Midget, RN, BSN.  He was notified by Ysidro Evert, Pharm D Candidate.         Prior Anticoagulation Instructions: INR 1.5  Take 1.5 tabs today  (extra half when you get home).  Then start 1 tab daily (5 mg) except 1.5 tabs on Tuesdays and Fridays (7.5 mg).   Recheck Jan 4th.    Current Anticoagulation Instructions: INR: 2.4 Continue with same dosage of 5mg  tablet daily. Recheck in 2 weeks. Patient prefers to come back to office in 4 weeks instead.

## 2010-07-14 NOTE — Medication Information (Signed)
Summary: [name]  Anticoagulant Therapy  Managed by: Weston Brass, PharmD Referring MD: Sherryl Manges MD PCP: Oliver Barre Supervising MD: Antoine Poche MD, Fayrene Fearing Indication 1: Atrial Fibrillation (ICD-427.31) Lab Used: [facility] Site: [facility] INR POC 1.9 INR RANGE 2.0-3.0  Dietary changes: no    Health status changes: no    Bleeding/hemorrhagic complications: no    Recent/future hospitalizations: no    Any changes in medication regimen? no    Recent/future dental: no  Any missed doses?: no       Is patient compliant with meds? yes       Allergies: No Known Drug Allergies  Anticoagulation Management History:      The patient is taking warfarin and comes in today for a routine follow up visit.  Positive risk factors for bleeding include presence of serious comorbidities.  Negative risk factors for bleeding include an age less than 5 years old.  The bleeding index is 'intermediate risk'.  Positive CHADS2 values include History of HTN and History of Diabetes.  Negative CHADS2 values include Age > 24 years old.  The start date was 01/31/2007.  His last INR was 6.7.  Anticoagulation responsible provider: Antoine Poche MD, Fayrene Fearing.  INR POC: 1.9.  Cuvette Lot#: [id number].  Exp: 02/2011.    Anticoagulation Management Assessment/Plan:      The patient's current anticoagulation dose is Coumadin 5 mg tabs: take as directed.  The target INR is 2.0-3.0.  The next INR is due 01/12/2010.  Anticoagulation instructions were given to patient.  Results were reviewed/authorized by Weston Brass, PharmD.  He was notified by Weston Brass PharmD.         Prior Anticoagulation Instructions: INR 2.6  Continue on same dosage 1/2 tablet daily.  Recheck in 1 week.    Current Anticoagulation Instructions: INR 1.9  Take 1 tablet today then resume same dose of 1/2 tablet every day.

## 2010-07-14 NOTE — Letter (Signed)
Summary: [facility]  [facility]   Imported By: Cala Bradford Mesiemore 11/25/2009 16:20:04  _____________________________________________________________________  External Attachment:    Type:   Image     Comment:   External Document

## 2010-07-14 NOTE — Letter (Signed)
Summary: Handout Printed  Printed Handout:  - Coumadin Instructions-w/out Meds 

## 2010-07-14 NOTE — Letter (Signed)
Summary: [facility]  [facility]   Imported By: Sherian Rein 11/19/2009 11:50:08  _____________________________________________________________________  External Attachment:    Type:   Image     Comment:   External Document

## 2010-07-14 NOTE — Assessment & Plan Note (Signed)
Summary: ATRIAL FLUTTER (427.32)      Allergies Added: NKDA  Referring Provider:  Oliver Barre Primary Provider:  Oliver Barre   History of Present Illness: [name] is referred today by Dr. Graciela Husbands for evaluation of recurrent tachypalpitations.  The patient has a long h/o heart racing and has undergone two ablation procedures.  The first in 2007.  At that time, there was no inducible SVT but he had dual AV nodal physiology and slow pathway modification.  He developed recurrent symptoms and was found to have atrial flutter for which he underwent atrial flutter isthmus ablation.  He did well until two weeks ago when he developed recurrent symptoms.  The patient was found to be back in what is probably 2:1 atrial flutter.  His INR has been therapeutic.  In flutter, he notes worsening sob and weakness and minimal peripheral edema.  No syncope.  Current Medications (verified): 1)  Lovaza 1 Gm Caps (Omega-3-Acid Ethyl Esters) .... 2 Tab Bid 2)  Coumadin 5 Mg Tabs (Warfarin Sodium) .... Use As Directed By Anticoagulation Clinic.  **past Due For Clinic Visit!** 3)  Lipitor 80 Mg Tabs (Atorvastatin Calcium) .Marland Kitchen.. 1 Daily 4)  Loperamide Hcl 2 Mg Caps (Loperamide Hcl) .Marland Kitchen.. 1 Daily 5)  Allopurinol 100 Mg Tabs (Allopurinol) .Marland Kitchen.. 1 Daily 6)  Metoprolol Succinate 50 Mg Xr24h-Tab (Metoprolol Succinate) .Marland Kitchen.. 1 Daily 7)  Hydrocodone-Acetaminophen 5-500 Mg Tabs (Hydrocodone-Acetaminophen) .... 1/2 - 1 By Mouth At Bedtime As Needed 8)  Oxycodone-Acetaminophen 5-325 Mg Tabs (Oxycodone-Acetaminophen) .Marland Kitchen.. 1 By Mouth As Needed Renal Stone 9)  Bd Insulin Syringe Ultrafine 31g X 5/16" 0.5 Ml Misc (Insulin Syringe-Needle U-100) .Marland Kitchen.. 1cc Use As Directed 10)  Humalog Kwikpen 100 Unit/ml Soln (Insulin Lispro (Human)) .... Qid (Just Before Each Meal) 20-40-50-10 Units 11)  Lisinopril 40 Mg Tabs (Lisinopril) .Marland Kitchen.. 1 Tab Qd 12)  Lasix 40 Mg Tabs (Furosemide) .Marland Kitchen.. 1 Tab Two Times A Day 13)  Kayexalate  Powd (Sodium Polystyrene  Sulfonate) .... 2 Oz Daily 14)  Lantus 100 Unit/ml Soln (Insulin Glargine) .... 80 Units Qhs 15)  Amlodipine Besylate 5 Mg Tabs (Amlodipine Besylate) .Marland Kitchen.. 1 Tab Qam 16)  Fenofibrate Micronized 134 Mg Caps (Fenofibrate Micronized) .Marland Kitchen.. 1 Qd 17)  Lansoprazole 30 Mg Cpdr (Lansoprazole) .Marland Kitchen.. 1 Qd  Allergies (verified): No Known Drug Allergies  Past History:  Past Medical History: Last updated: 08/01/2008 Hyperlipidemia GERD Hypertension Gout atrial flutter - dr Graciela Husbands, s/p ablation Diabetes mellitus, type II non-compliance Coronary artery disease - non obstructive per cath 1/10; dr Juanda Chance Anticoagulation therapy hx of DFU's hx of duodenal polyp by EGD - 8/08 - Dr Orr/GI Peripheral neuropathy gastroparesis erectile dysfunction Nephrolithiasis, hx of bilateral renal cyst Renal insufficiency - dr Briant Cedar   Past Surgical History: Last updated: 08/01/2008 s/p aflutter ablation - dr Graciela Husbands s/p pilonidal cyst s/p right arm surgury after fracture planned - [facility] surg[facility] (right) for march 2010 - dr duda  Family History: Reviewed history from 08/19/2008 and no changes required. DM - both grandmothers and father mother with GBMF - died 67yo father died age 71 yo with aneurysm  Social History: Reviewed history from 08/01/2008 and no changes required. Current Smoker Married 3 children Alcohol use-no occasional marijuana work - sales rep - [employer]  Review of Systems       The patient complains of dyspnea on exertion and peripheral edema.  The patient denies chest pain and syncope.         Otherwise all systems reviewed and  negative except as noted above.  Vital Signs:  Patient profile:   60 year old male Height:      73 inches Weight:      232 pounds BMI:     30.72 Pulse rate:   145 / minute BP sitting:   148 / 98  (left arm) Cuff size:   regular  Vitals Entered By: Hardin Negus, RMA (July 07, 2009 2:46 PM)  Physical Exam  General:  normal  appearance.   Eyes:  No corneal or conjunctival inflammation noted. EOMI. Perrla.  Mouth:  Oral mucosa and oropharynx without lesions or exudates. Neck:  no masses, thyromegaly, or abnormal cervical nodes Lungs:  clear bilaterally with no wheezes, rales, or rhonchi. Heart:  Regular tachycardia with an LV lift. Abdomen:  abdomen is soft, nontender.  no hepatosplenomegaly.   not distended.  no hernia  Msk:  no deformity or scoliosis noted with normal posture and gait Pulses:  dorsalis pedis intact bilat.   Extremities:  no deformity.  no ulcer on the feet.  feet are of normal color and temp.  1+ right pedal edema and 1+ left pedal edema.  there is bilateral onychomycosis. Neurologic:  Alert and oriented x 3.   EKG  Procedure date:  07/07/2009  Findings:      Probable atrial flutter with an RVR.  Impression & Recommendations:  Problem # 1:  ATRIAL FLUTTER (ICD-427.32) He has recurrent tachycardia which is probably atrial flutter.  I have discussed the risks/benefits/goals/ and expectations with the patient and he wishes to proceed with catheter ablation. His updated medication list for this problem includes:    Coumadin 5 Mg Tabs (Warfarin sodium) ..... Use as directed by anticoagulation clinic.  **past due for clinic visit!**    Metoprolol Succinate 50 Mg Xr24h-tab (Metoprolol succinate) .Marland Kitchen... 1 daily  Orders: TLB-BMP (Basic Metabolic Panel-BMET) (80048-METABOL) TLB-CBC Platelet - w/Differential (85025-CBCD)  Problem # 2:  ANTICOAGULATION THERAPY (ICD-V58.61) The patient has been therapeutic with his coumadin dosing.  will ask him to eat some green vegetables tonight and he will proceed with ablation tomorrow.  Problem # 3:  HYPERTENSION (ICD-401.9) His blood pressure appears to be well controlled, at least when he is out of rhythm. A low sodium diet and continued meds are recommended. His updated medication list for this problem includes:    Metoprolol Succinate 50 Mg Xr24h-tab  (Metoprolol succinate) .Marland Kitchen... 1 daily    Lisinopril 40 Mg Tabs (Lisinopril) .Marland Kitchen... 1 tab qd    Lasix 40 Mg Tabs (Furosemide) .Marland Kitchen... 1 tab two times a day    Amlodipine Besylate 5 Mg Tabs (Amlodipine besylate) .Marland Kitchen... 1 tab qam  Other Orders: EKG w/ Interpretation (93000)  Patient Instructions: 1)  Your physician has recommended that you have an ablation.  Catheter ablation is a medical procedure used to treat some cardiac arrhythmias (irregular heartbeats). During catheter ablation, a long, thin, flexible tube is put into a blood vessel in your groin (upper thigh), or neck. This tube is called an ablation catheter. It is then guided to your heart through the blood vessel. Radiofrequency waves destroy small areas of heart tissue where abnormal heartbeats may cause an arrhythmia to start.  Please see the instruction sheet given to you today. 2)  Your physician recommends that you HAVE LABS TODAY: BMET, CBC 3)  Your physician has recommended you make the following change in your medication: Take 1/2 dose of Coumadin tomorrow. Do NOT take any Coumadin Wednesday or Thursday

## 2010-07-14 NOTE — Letter (Signed)
Summary: [facility]   Imported By: Debby Freiberg 12/10/2009 14:30:39  _____________________________________________________________________  External Attachment:    Type:   Image     Comment:   External Document

## 2010-07-14 NOTE — Letter (Signed)
Summary: [facility]  [facility]   Imported By: Marylou Mccoy 01/26/2010 16:26:05  _____________________________________________________________________  External Attachment:    Type:   Image     Comment:   External Document

## 2010-07-14 NOTE — Medication Information (Signed)
Summary: Coumadin Clinic  Anticoagulant Therapy  Managed by: Cloyde Reams, RN, BSN Referring MD: Sherryl Manges MD PCP: Oliver Barre Supervising MD: Eden Emms MD, Theron Arista Indication 1: Atrial Fibrillation (ICD-427.31) Lab Used: [facility] Site: [facility] PT 22.2 INR POC 1.81 INR RANGE 2.0-3.0  Dietary changes: no    Health status changes: yes       Details: Pt reports incr fluid and swelling "all over" pt take 160mg  Lasix bid   Bleeding/hemorrhagic complications: no     Any changes in medication regimen? no     Any missed doses?: no       Is patient compliant with meds? yes      Comments: Vancomycin x another 3 weeks.    Allergies: No Known Drug Allergies  Anticoagulation Management History:      His anticoagulation is being managed by telephone today.  Positive risk factors for bleeding include presence of serious comorbidities.  Negative risk factors for bleeding include an age less than 3 years old.  The bleeding index is 'intermediate risk'.  Positive CHADS2 values include History of HTN and History of Diabetes.  Negative CHADS2 values include Age > 57 years old.  The start date was 01/31/2007.  His last INR was 2.4.  Prothrombin time is 22.2.  Anticoagulation responsible provider: Eden Emms MD, Theron Arista.  INR POC: 1.81.  Exp: 09/2010.    Anticoagulation Management Assessment/Plan:      The patient's current anticoagulation dose is Coumadin 5 mg tabs: take as directed.  The target INR is 2.0-3.0.  The next INR is due 10/14/2009.  Anticoagulation instructions were given to patient.  Results were reviewed/authorized by Cloyde Reams, RN, BSN.  He was notified by Cloyde Reams RN.         Prior Anticoagulation Instructions: INR 1.8 Today take extra 2.5mg s then resume dose to 5mg s daily Recheck INR on Monday with [facility] labs. Verbal order given to [facility] with [facility] @ [phone].   Current Anticoagulation Instructions: INR 1.81  Called spoke with pt instructed to take an extra 1/2 tablet  today, then start taking 5mg  daily except 7.5mg  on Wednesdays.  Recheck PT/INR on 10/13/09 Monday with Vancomycin levels.  Called spoke with Tresa Endo, [facility] gave verbal orders to recheck in 1 week.

## 2010-07-14 NOTE — Letter (Signed)
[facility]   Imported By: Sherian Rein 05/13/2010 08:00:33  _____________________________________________________________________  External Attachment:    Type:   Image     Comment:   External Document

## 2010-07-14 NOTE — Progress Notes (Signed)
Summary: lansoprazole  Phone Note Refill Request Message from:  Fax from Pharmacy on October 02, 2009 4:58 PM  Refills Requested: Medication #1:  Lansaprazole 30mg  take 1 po qd  Method Requested: Fax to Local Pharmacy Initial call taken by: Orlan Leavens,  October 02, 2009 4:58 PM  Follow-up for Phone Call        Faxed paper req back to [facility] denied pt should be taking omprazole instead. Lansaprazole was d/c on 09/24/09 Follow-up by: Orlan Leavens,  October 02, 2009 4:59 PM

## 2010-07-14 NOTE — Medication Information (Signed)
Summary: rov/sl  Anticoagulant Therapy  Managed by: Bethena Midget, RN, BSN Referring MD: Hillis Range, MD PCP: Oliver Barre Supervising MD: Shirlee Latch MD, Matias Thurman Indication 1: Atrial Fibrillation (ICD-427.31) Lab Used: [facility] Site: [facility] INR POC 1.9 INR RANGE 2.0-3.0  Dietary changes: no    Health status changes: no    Bleeding/hemorrhagic complications: no    Recent/future hospitalizations: no    Any changes in medication regimen? no    Recent/future dental: no  Any missed doses?: no       Is patient compliant with meds? yes       Allergies: No Known Drug Allergies  Anticoagulation Management History:      The patient is taking warfarin and comes in today for a routine follow up visit.  Positive risk factors for bleeding include presence of serious comorbidities.  Negative risk factors for bleeding include an age less than 60 years old.  The bleeding index is 'intermediate risk'.  Positive CHADS2 values include History of HTN and History of Diabetes.  Negative CHADS2 values include Age > 1 years old.  The start date was 01/31/2007.  His last INR was 6.7.  Anticoagulation responsible provider: Shirlee Latch MD, Ammanda Dobbins.  INR POC: 1.9.  Cuvette Lot#: [id number].  Exp: 03/2011.    Anticoagulation Management Assessment/Plan:      The patient's current anticoagulation dose is Coumadin 5 mg tabs: take as directed.  The target INR is 2.0-3.0.  The next INR is due 02/09/2010.  Anticoagulation instructions were given to patient.  Results were reviewed/authorized by Bethena Midget, RN, BSN.  He was notified by Bethena Midget, RN, BSN.         Prior Anticoagulation Instructions: INR 1.7  Take Coumadin 0.5 tabs all days except Coumadin 1 tab on Mondays.  Current Anticoagulation Instructions: INR 1.9 Change dose to 1/2 pill everyday except 1 pill on Mondays and Fridays. Recheck in 2 weeks.

## 2010-07-14 NOTE — Progress Notes (Signed)
  Phone Note Refill Request  on November 25, 2009 3:21 PM  Refills Requested: Medication #1:  AMLODIPINE BESYLATE 10 MG TABS Take one tablet by mouth daily   Dosage confirmed as above?Dosage Confirmed   Notes: [facility] Initial call taken by: Scharlene Gloss,  November 25, 2009 3:21 PM    Prescriptions: AMLODIPINE BESYLATE 10 MG TABS (AMLODIPINE BESYLATE) Take one tablet by mouth daily  #30 x 9   Entered by:   Zella Ball Ewing   Authorized by:   Minus Breeding MD   Signed by:   Scharlene Gloss on 11/25/2009   Method used:   Faxed to ...       [facility] (mail-order)       [address] [place], [place]  [address]       Ph: [phone]       Fax: [phone]   RxID:   [id number]

## 2010-07-14 NOTE — Assessment & Plan Note (Signed)
Summary: Cardiology Nuclear Testing  Nuclear Med Background Indications for Stress Test: Evaluation for Ischemia, Surgical Clearance  Indications Comments: Pending possible renal transplant  History: Ablation, Cardioversion, Heart Catheterization  History Comments: '09 Cath:mild n/o CAD, EF=60%; h/o afib.  Symptoms: Fatigue, Palpitations, Rapid HR    Nuclear Pre-Procedure Cardiac Risk Factors: Hypertension, IDDM Type 2, Lipids, Smoker Caffeine/Decaff Intake: None NPO After: 8:30 PM Lungs: Clear.  O2 Sat 97% on RA. IV 0.9% NS with Angio Cath: 22g     IV Site: (R) Hand IV Started by: Stanton Kidney EMT-P Chest Size (in) 48     Height (in): 73 Weight (lb): 223 BMI: 29.53 Tech Comments: CBG=142 @ 8:30 am, per patient.  Nuclear Med Study 1 or 2 day study:  1 day     Stress Test Type:  Eugenie Birks Reading MD:  Marca Ancona, MD     Referring MD:  Hillis Range, MD Resting Radionuclide:  Technetium 48m Tetrofosmin     Resting Radionuclide Dose:  10.6 mCi  Stress Radionuclide:  Technetium 34m Tetrofosmin     Stress Radionuclide Dose:  33 mCi   Stress Protocol   Lexiscan: 0.4 mg   Stress Test Technologist:  Rea College CMA-N     Nuclear Technologist:  Domenic Polite CNMT  Rest Procedure  Myocardial perfusion imaging was performed at rest 45 minutes following the intravenous administration of Myoview Technetium 53m Tetrofosmin.  Stress Procedure  The patient received IV Lexiscan 0.4 mg over 15-seconds.  Myoview injected at 30-seconds.  There were nonspecific ST-T wave changes and occasional PVC's with infusion.  He did c/o chest and jaw tightness.  Quantitative spect images were obtained after a 45 minute delay.                                                                                                                                                                                                                                       Images were checked prior to patient leaving and  discussed with Dr. Daleen Squibb, (DOD), he said patient is to follow up with Dr. Johney Frame.  QPS Raw Data Images:  Normal; no motion artifact; normal heart/lung ratio. Stress Images:  Small apical perfusion defect.  Rest Images:  Small apical perfusion defect.  Subtraction (SDS):  Small, fixed apical perfusion defect.  Transient Ischemic Dilatation:  .98  (Normal <1.22)  Lung/Heart Ratio:  .36  (Normal <0.45)  Quantitative Gated  Spect Images QGS EDV:  206 ml QGS ESV:  120 ml QGS EF:  42 % QGS cine images:  Global hypokinesis, worse towards apex.    Overall Impression  Exercise Capacity: Lexiscan study BP Response: Normal blood pressure response. Clinical Symptoms: Chest tightness ECG Impression: No significant ST segment change suggestive of ischemia. Overall Impression: Small fixed apical perfusion defect represents apical thinning versus small area of infarction.  No ischemia. Given wall motion abnormalities, may be infarction.  Overall Impression Comments: Suggest echo to confirm EF and wall motion.   Appended Document: Cardiology Nuclear Testing No evidence of ischemia.  I have reviewed echo from [facility] (7/11) and also [facility] (07/10/09) which both reveal EF 45%.  I have also reviewed cath from 2009 which reveals mild nonobstructive coronary artery disease with 30 and 20% stenoses in the proximal left anterior descending, no significant obstruction in the circumflex artery, and 70% narrowing in the small nondominant right coronary artery.  I would therefore prefer to manage medically, without repeat cath, particularly given severe renal impairment. Given his multiple comorbidities including EF 45%, nonobstructive CAD, renal failure, and diabetes, he would be at least moderate risk for surgery.  He is not a good candidate for beta blocker therapy given his Mobitz I second degree AV block.  Based on his myoview, his risk would not be significantly reduced with further cardiac  testing at this time. Proceed to surgery if medical necessary with appropriate precautions.

## 2010-07-14 NOTE — Assessment & Plan Note (Signed)
Summary: 2 MTH FU STC   Vital Signs:  Patient profile:   60 year old male Height:      73 inches (185.42 cm) Weight:      235.25 pounds (106.93 kg) O2 Sat:      97 % on Room air Temp:     98.3 degrees F (36.83 degrees C) oral Pulse rate:   94 / minute BP sitting:   140 / 78  (left arm) Cuff size:   large  Vitals Entered By: Josph Macho RMA (August 06, 2009 11:16 AM)  O2 Flow:  Room air CC: 2 month follow up/ pt has a sore on right lower leg X2days/ CF Is Patient Diabetic? Yes   Referring Provider:  Oliver Barre Primary Provider:  Oliver Barre  CC:  2 month follow up/ pt has a sore on right lower leg X2days/ CF.  History of Present Illness: pt states he is having hypoglycemia in the afternoon.  it is highest at hs.  he has been skipping the lantus due to cbg of 100 at hs.  it is then 200 in am, which is the highest time of day. he renal function has declined, and he is scheduled for a shunt next week.   pt states of few day of moderate rash at the left leg, but no associated pain (due to neuropathy).  Current Medications (verified): 1)  Lovaza 1 Gm Caps (Omega-3-Acid Ethyl Esters) .... 2 Tab Bid 2)  Coumadin 5 Mg Tabs (Warfarin Sodium) .... Use As Directed By Anticoagulation Clinic. 3)  Lipitor 80 Mg Tabs (Atorvastatin Calcium) .Marland Kitchen.. 1 Daily 4)  Loperamide Hcl 2 Mg Caps (Loperamide Hcl) .Marland Kitchen.. 1 Daily 5)  Allopurinol 100 Mg Tabs (Allopurinol) .Marland Kitchen.. 1 Daily 6)  Bd Insulin Syringe Ultrafine 31g X 5/16" 0.5 Ml Misc (Insulin Syringe-Needle U-100) .Marland Kitchen.. 1cc Use As Directed 7)  Humalog Kwikpen 100 Unit/ml Soln (Insulin Lispro (Human)) .... Qid (Just Before Each Meal) 20-40-50-10 Units 8)  Lasix 40 Mg Tabs (Furosemide) .Marland Kitchen.. 1 Tab Two Times A Day 9)  Kayexalate  Powd (Sodium Polystyrene Sulfonate) .... 2 Oz Daily 10)  Lantus 100 Unit/ml Soln (Insulin Glargine) .... 80 Units Qhs 11)  Amlodipine Besylate 5 Mg Tabs (Amlodipine Besylate) .Marland Kitchen.. 1 Tab Qam 12)  Fenofibrate Micronized 134 Mg  Caps (Fenofibrate Micronized) .Marland Kitchen.. 1 Qd 13)  Lansoprazole 30 Mg Cpdr (Lansoprazole) .Marland Kitchen.. 1 Qd 14)  Onetouch Ultra Test  Strp (Glucose Blood) .... 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    Physical Exam  General:  normal appearance.   Extremities:  2+ right pedal edema and 2+ left pedal edema.   there are 3 abrasions at the left anterior tibilal area (each is 2 cm diameter).  no erythema or drainage.   Impression &  Recommendations:  Problem # 1:  DIABETES MELLITUS, TYPE II (ICD-250.00) insulin requirements have decreased due to #2  Problem # 2:  RENAL INSUFFICIENCY (ICD-588.9) Assessment: Deteriorated  Problem # 3:  abrasion mild  Medications Added to Medication List This Visit: 1)  Humalog Kwikpen 100 Unit/ml Soln (Insulin lispro (human)) .... Qid (just before each meal) 20-40-50-10 units 2)  Lantus 100 Unit/ml Soln (Insulin glargine) .... 20 units at bedtime  Other Orders: Est. Patient Level IV (09811)  Patient Instructions: 1)  resume lantus at 20 units at night. 2)  reduce humalog (just before each meal) 30-40-50-20 units. 3)  return 2 weeks 4)  check your blood sugar  2 times a day.  vary the time of day when you check, between before the 3 meals, and at bedtime.  also check if you have symptoms of your blood sugar being too high or too low.  please keep a record of the readings and bring it to your next appointment here.  please call us sooner if you are having low blood sugar episodes. 5)  keep the left leg elevated x a few days. 6)  once daily, gently wash the left leg abraded area, and cover with antibiotic cream and a large bandaid.  call if it gets redder.

## 2010-07-14 NOTE — Assessment & Plan Note (Signed)
Summary: EKG/HEART BLOCK  Nurse Visit   Allergies: No Known Drug Allergies  Orders Added: 1)  TLB-BMP (Basic Metabolic Panel-BMET) [80048-METABOL]

## 2010-07-14 NOTE — Letter (Signed)
Summary: [facility] Orders  [facility] Orders   Imported By: Debby Freiberg 12/10/2009 14:30:16  _____________________________________________________________________  External Attachment:    Type:   Image     Comment:   External Document

## 2010-07-14 NOTE — Medication Information (Signed)
Summary: Coumadin Clinic  Anticoagulant Therapy  Managed by: Bethena Midget, RN, BSN Referring MD: Sherryl Manges MD PCP: Oliver Barre Supervising MD: Clifton James MD, Cristal Deer Indication 1: Atrial Fibrillation (ICD-427.31) Lab Used: [facility] Site: [facility] PT 23.0 INR POC 2.06 INR RANGE 2.0-3.0  Dietary changes: no    Health status changes: no    Bleeding/hemorrhagic complications: no    Recent/future hospitalizations: no    Any changes in medication regimen? no    Recent/future dental: no  Any missed doses?: no       Is patient compliant with meds? yes       Allergies: No Known Drug Allergies  Anticoagulation Management History:      The patient is taking warfarin and comes in today for a routine follow up visit.  Positive risk factors for bleeding include presence of serious comorbidities.  Negative risk factors for bleeding include an age less than 15 years old.  The bleeding index is 'intermediate risk'.  Positive CHADS2 values include History of HTN and History of Diabetes.  Negative CHADS2 values include Age > 30 years old.  The start date was 01/31/2007.  His last INR was 2.4.  Prothrombin time is 23.0.  Anticoagulation responsible provider: Clifton James MD, Cristal Deer.  INR POC: 2.06.  Cuvette Lot#: [id number].  Exp: 09/2010.    Anticoagulation Management Assessment/Plan:      The patient's current anticoagulation dose is Coumadin 5 mg tabs: take as directed.  The target INR is 2.0-3.0.  The next INR is due 11/03/2009.  Anticoagulation instructions were given to patient.  Results were reviewed/authorized by Bethena Midget, RN, BSN.  He was notified by Bethena Midget, RN, BSN.         Prior Anticoagulation Instructions: INR 1.21  Spoke with pt.  Take 1 1/2 tablets today and tomorrow then resume same dose of 1 tablet every day except 1 1/2 tablets on Wednesday.  Recheck next week.  Weston Brass PharmD  Oct 23, 2009 6:30 PM    Spoke with Waynetta Sandy at [facility].  Gave orders to recheck  INR on 5/16.   Current Anticoagulation Instructions: INR 2.06 Continue 5mg s daily except 7.5mg s on Wednesdays. Recheck in one week. Orders given to [facility] team- Beth was given orders.

## 2010-07-14 NOTE — Medication Information (Signed)
Summary: rov/sp  Anticoagulant Therapy  Managed by: Eda Keys, PharmD Referring MD: Sherryl Manges MD PCP: Oliver Barre Supervising MD: Shirlee Latch MD, Freida Busman Indication 1: Atrial Fibrillation (ICD-427.31) Lab Used: [facility] Site: [facility] INR POC 4.1 INR RANGE 2.0-3.0  Dietary changes: no    Health status changes: no    Bleeding/hemorrhagic complications: yes       Details: extra bleeding upon having blood drawn this am  Recent/future hospitalizations: no    Any changes in medication regimen? yes       Details: doxycycline started this week.    Recent/future dental: no  Any missed doses?: no       Is patient compliant with meds? yes       Allergies: No Known Drug Allergies  Anticoagulation Management History:      The patient is taking warfarin and comes in today for a routine follow up visit.  Positive risk factors for bleeding include presence of serious comorbidities.  Negative risk factors for bleeding include an age less than 42 years old.  The bleeding index is 'intermediate risk'.  Positive CHADS2 values include History of HTN and History of Diabetes.  Negative CHADS2 values include Age > 102 years old.  The start date was 01/31/2007.  His last INR was 9.7 ratio.  Anticoagulation responsible provider: Shirlee Latch MD, Jadelynn Boylan.  INR POC: 4.1.  Cuvette Lot#: [id number].  Exp: 02/2011.    Anticoagulation Management Assessment/Plan:      The patient's current anticoagulation dose is Coumadin 5 mg tabs: take as directed.  The target INR is 2.0-3.0.  The next INR is due 12/08/2009.  Anticoagulation instructions were given to patient.  Results were reviewed/authorized by Eda Keys, PharmD.  He was notified by Eda Keys.         Prior Anticoagulation Instructions: INR 2.4  Start new dose of 1 tablet every day except 1/2 tablet on Tuesday, Thursday and Saturday.   Current Anticoagulation Instructions: INR 4.1  Do not take coumadin today or tomorrow.  Then start NEW  dosing schedule of 1 tablet on Monday, Wednesday and Friday and 1/2 tablet all other days.  Return to clinic in 1 week.

## 2010-07-14 NOTE — Assessment & Plan Note (Signed)
Summary: per check out/sf  Medications Added LOVAZA 1 GM CAPS (OMEGA-3-ACID ETHYL ESTERS) 1 tab bid HUMALOG KWIKPEN 100 UNIT/ML SOLN (INSULIN LISPRO (HUMAN)) qid (just before each meal) 20-40-45-10 units FUROSEMIDE 80 MG TABS (FUROSEMIDE) Take one tablet by mouth daily. LANTUS 100 UNIT/ML SOLN (INSULIN GLARGINE) 40 units at bedtime AMLODIPINE BESYLATE 10 MG TABS (AMLODIPINE BESYLATE) Take one tablet by mouth daily OXYCODONE HCL 5 MG TABS (OXYCODONE HCL) as needed CALCITRIOL 0.25 MCG CAPS (CALCITRIOL) at bedtime RENAGEL 800 MG TABS (SEVELAMER HCL) three times a day SODIUM BICARBONATE 650 MG TABS (SODIUM BICARBONATE) two times a day * VANCOMYCIN HCL IN DEXTROSE SOLN (VANCOMYCIN HCL IN DEXTROSE) 1500mg  IV every 48 hours FERROUS SULFATE 325 (65 FE) MG TBEC (FERROUS SULFATE) two times a day ARANESP (ALBUMIN FREE) 150 MCG/0.75ML SOLN (DARBEPOETIN ALFA-POLYSORBATE) weekly PRILOSEC 20 MG CPDR (OMEPRAZOLE) at bedtime COUMADIN 5 MG TABS (WARFARIN SODIUM) take as directed      Allergies Added: NKDA  Visit Type:  Follow-up Referring Provider:  Oliver Barre Primary Provider:  Oliver Barre   History of Present Illness: The patient presents today for routine electrophysiology followup.  He recently developed a R foot ulcer requiring antibiotics and extensive inpatient care at [facility].  With this infection, he has developed atrial fibrillation.  He reports symptoms of palpitations and SOB.  The patient denies symptoms of chest pain, dizziness, presyncope, syncope, or neurologic sequela. He has chronic stable edema from advanced renal disease.  The patient is tolerating medications without difficulties and is otherwise without complaint today.   Current Medications (verified): 1)  Lovaza 1 Gm Caps (Omega-3-Acid Ethyl Esters) .Marland Kitchen.. 1 Tab Bid 2)  Lipitor 80 Mg Tabs (Atorvastatin Calcium) .Marland Kitchen.. 1 Daily 3)  Loperamide Hcl 2 Mg Caps (Loperamide Hcl) .Marland Kitchen.. 1 Daily 4)  Allopurinol 100 Mg Tabs  (Allopurinol) .Marland Kitchen.. 1 Daily 5)  Bd Insulin Syringe Ultrafine 31g X 5/16" 0.5 Ml Misc (Insulin Syringe-Needle U-100) .Marland Kitchen.. 1cc Use As Directed 6)  Humalog Kwikpen 100 Unit/ml Soln (Insulin Lispro (Human)) .... Qid (Just Before Each Meal) 20-40-45-10 Units 7)  Furosemide 80 Mg Tabs (Furosemide) .... Take One Tablet By Mouth Daily. 8)  Kayexalate  Powd (Sodium Polystyrene Sulfonate) .... 2 Oz Daily 9)  Lantus 100 Unit/ml Soln (Insulin Glargine) .... 40 Units At Bedtime 10)  Amlodipine Besylate 10 Mg Tabs (Amlodipine Besylate) .... Take One Tablet By Mouth Daily 11)  Fenofibrate Micronized 134 Mg Caps (Fenofibrate Micronized) .Marland Kitchen.. 1 Qd 12)  Onetouch Ultra Test  Strp (Glucose Blood) .... Two Times A Day, and Lancets 250.00 13)  Oxycodone Hcl 5 Mg Tabs (Oxycodone Hcl) .... As Needed 14)  Calcitriol 0.25 Mcg Caps (Calcitriol) .... At Bedtime 15)  Renagel 800 Mg Tabs (Sevelamer Hcl) .... Three Times A Day 16)  Sodium Bicarbonate 650 Mg Tabs (Sodium Bicarbonate) .... Two Times A Day 17)  Vancomycin Hcl in Dextrose Soln (Vancomycin Hcl in Dextrose) .... 1500mg  Iv Every 48 Hours 18)  Ferrous Sulfate 325 (65 Fe) Mg Tbec (Ferrous Sulfate) .... Two Times A Day 19)  Aranesp (Albumin Free) 150 Mcg/0.40ml Soln (Darbepoetin Alfa-Polysorbate) .... Weekly 20)  Prilosec 20 Mg Cpdr (Omeprazole) .... At Bedtime  Allergies (verified): No Known Drug Allergies  Past History:  Past Medical History: Persistent atrial fibrillation Hyperlipidemia GERD Hypertension Gout Typical atrial flutter s/p CTI ablation by Dr Timoteo Ace flutter s/p mitral annular reentry ablation by Dr Johney Frame 07/08/09 AVNRT s/p av nodal modificaiton by Dr Graciela Husbands and repeat procedure 07/08/09 First degree AV block  with occasional Mobitz I AV block Diabetes mellitus, type II non-compliance Coronary artery disease - non obstructive per cath 1/10; dr Juanda Chance Anticoagulation therapy hx of DFU's hx of duodenal polyp by EGD - 8/08 - Dr  Orr/GI Peripheral neuropathy gastroparesis erectile dysfunction Nephrolithiasis, hx of bilateral renal cyst Renal insufficiency - dr Briant Cedar   Past Surgical History: Reviewed history from 07/30/2009 and no changes required. s/p aflutter ablation - dr Graciela Husbands s/p atypical atrial flutter ablaiton Dr Johney Frame 07/08/09 s/p pilonidal cyst s/p right arm surgury after fracture planned - [facility] (right) for march 2010 - dr duda  Social History: Reviewed history from 08/01/2008 and no changes required. Current Smoker Married 3 children Alcohol use-no occasional marijuana work - sales rep - [employer]  Review of Systems       All systems are reviewed and negative except as listed in the HPI.   Vital Signs:  Patient profile:   60 year old male Height:      73 inches Weight:      239 pounds BMI:     31.65 Pulse rate:   96 / minute BP sitting:   180 / 92  (right arm)  Vitals Entered By: Laurance Flatten CMA (September 24, 2009 10:46 AM)  Physical Exam  General:  chronically ill, NAD Head:  normocephalic and atraumatic Eyes:  PERRLA/EOM intact; conjunctiva and lids normal. Mouth:  Teeth, gums and palate normal. Oral mucosa normal. Neck:  supple, JVP 9cm Lungs:  decreased BS at the bases Heart:  iRRR, no m/r/g Abdomen:  Bowel sounds positive; abdomen soft and non-tender without masses, organomegaly, or hernias noted. No hepatosplenomegaly. Msk:  R foot dressing in place Extremities:  R foot dressing in place Neurologic:  stable Skin:  R foot dressing in place Cervical Nodes:  no significant adenopathy Psych:  Normal affect.   EKG  Procedure date:  09/24/2009  Findings:      afib, V rates 90s, nonspecific ST/T changes  Impression & Recommendations:  Problem # 1:  ATRIAL FIBRILLATION (ICD-427.31) Unfortunatley, Mr Frett has developed afib in the setting of cellulitis/ foot ulcer.  His CHADS 2 score is elevated.  We will therefore restart coumadin today.  Once  he has been adequately anticoagulated, we will consider initiation of an antiarrhythmic medicine.  Given his renal failure, he is not a candidate for sotalol or tikosyn.  He has mild CAD, so I would avoid Ics.  This pretty much leaves [place] with either amiodarone or multaq for his afib.  We will discuss this further upon return.  I will avoid av nodal agents at this time as his HR appears stable and he has a h/o Mobitz I av block.  He may require low dose metoprolol for heart rates in the future.  Problem # 2:  HYPERTENSION (ICD-401.9) above goal pt reports good BP control recently he is advised to continue close BP checks at home and contact us if his BP remains elevated  Problem # 3:  HYPERLIPIDEMIA (ICD-272.4) stable  His updated medication list for this problem includes:    Lovaza 1 Gm Caps (Omega-3-acid ethyl esters) .Marland Kitchen... 1 tab bid    Lipitor 80 Mg Tabs (Atorvastatin calcium) .Marland Kitchen... 1 daily    Fenofibrate Micronized 134 Mg Caps (Fenofibrate micronized) .Marland Kitchen... 1 qd  Problem # 4:  CORONARY ARTERY DISEASE (ICD-414.00) mild nonobstructive CAD no changes  Other Orders: EKG w/ Interpretation (93000)  Patient Instructions: 1)  Your physician recommends that you schedule a follow-up  appointment in: 5-6 weeks with Dr Johney Frame 2)  You have been referred back to [facility] for afib 3)  Your physician has recommended you make the following change in your medication: start Coumadin 5mg  daily as follow up with Coumadin clinic Prescriptions: COUMADIN 5 MG TABS (WARFARIN SODIUM) take as directed  #45 x 3   Entered by:   Dennis Bast, RN, BSN   Authorized by:   Hillis Range, MD   Signed by:   Dennis Bast, RN, BSN on 09/24/2009   Method used:   Electronically to        [facility] (retail)       [address]       [place], [place]  [id number]       Ph: [phone] or [phone]       Fax: [phone]   RxID:   [id number]

## 2010-07-16 ENCOUNTER — Encounter: Payer: Self-pay | Admitting: Internal Medicine

## 2010-07-16 NOTE — Miscellaneous (Signed)
  Clinical Lists Changes  Observations: Added new observation of NUCLEAR NOS: Exercise Capacity: Lexiscan study BP Response: Normal blood pressure response. Clinical Symptoms: Chest tightness ECG Impression: No significant ST segment change suggestive of ischemia. Overall Impression: Small fixed apical perfusion defect represents apical thinning versus small area of infarction.  No ischemia. Given wall motion abnormalities, may be infarction.  Overall Impression Comments: Suggest echo to confirm EF and wall motion.     Signed by Marca Ancona, MD on 01/07/2010 at 7:32 PM  ________________________________________________________________________ No evidence of ischemia.  I have reviewed echo from [facility] (7/11) and also [place] (07/10/09) which both reveal EF 45%.  I have also reviewed cath from 2009 which reveals mild nonobstructive coronary artery disease with 30 and 20% stenoses in the proximal left anterior descending, no significant obstruction in the circumflex artery, and 70% narrowing in the small nondominant right coronary artery.  I would therefore prefer to manage medically, without repeat cath, particularly given severe renal impairment. Given his multiple comorbidities including EF 45%, nonobstructive CAD, renal failure, and diabetes, he would be at least moderate risk for surgery.  He is not a good candidate for beta blocker therapy given his Mobitz I second degree AV block.  Based on his myoview, his risk would not be significantly reduced with further cardiac testing at this time. Proceed to surgery if medical necessary with appropriate precautions.   (01/07/2010 10:09)      Nuclear Study  Procedure date:  01/07/2010  Findings:      Exercise Capacity: Lexiscan study BP Response: Normal blood pressure response. Clinical Symptoms: Chest tightness ECG Impression: No significant ST segment change suggestive of ischemia. Overall Impression: Small fixed  apical perfusion defect represents apical thinning versus small area of infarction.  No ischemia. Given wall motion abnormalities, may be infarction.  Overall Impression Comments: Suggest echo to confirm EF and wall motion.     Signed by Marca Ancona, MD on 01/07/2010 at 7:32 PM  ________________________________________________________________________ No evidence of ischemia.  I have reviewed echo from [facility] (7/11) and also  (07/10/09) which both reveal EF 45%.  I have also reviewed cath from 2009 which reveals mild nonobstructive coronary artery disease with 30 and 20% stenoses in the proximal left anterior descending, no significant obstruction in the circumflex artery, and 70% narrowing in the small nondominant right coronary artery.  I would therefore prefer to manage medically, without repeat cath, particularly given severe renal impairment. Given his multiple comorbidities including EF 45%, nonobstructive CAD, renal failure, and diabetes, he would be at least moderate risk for surgery.  He is not a good candidate for beta blocker therapy given his Mobitz I second degree AV block.  Based on his myoview, his risk would not be significantly reduced with further cardiac testing at this time. Proceed to surgery if medical necessary with appropriate precautions.

## 2010-07-16 NOTE — Letter (Signed)
Summary: [facility]  [facility]   Imported By: Lennie Odor 06/22/2010 11:28:53  _____________________________________________________________________  External Attachment:    Type:   Image     Comment:   External Document

## 2010-07-16 NOTE — Assessment & Plan Note (Signed)
Summary: 6 month rov.sl  Medications Added LIPITOR 40 MG TABS (ATORVASTATIN CALCIUM) once daily HUMALOG KWIKPEN 100 UNIT/ML SOLN (INSULIN LISPRO (HUMAN)) 30 U two times a day FUROSEMIDE 80 MG TABS (FUROSEMIDE) 2 tabs by mouth in AM 2 tabs by mouth in PM ARANESP (ALBUMIN FREE) 150 MCG/0.75ML SOLN (DARBEPOETIN ALFA-POLYSORBATE) Q3 WEEKS        Visit Type:  Follow-up Referring Provider:  Oliver Barre Primary Provider:  Oliver Barre  CC:  NO CARDIAC COMPLAINTS.  History of Present Illness: The patient presents today for routine electrophysiology followup.  He remains in sinus rhythm today.  He reports rare palpitations.   His SOB has improved.  He has not yet required dialysis and is folllowed closely by nephrology.   The patient denies symptoms of chest pain, dizziness, presyncope, syncope, or neurologic sequela. He has chronic stable edema from advanced renal disease.   He has not started dialysis but appears to be doing well.  He reports gradual but steady weakness in left arm and leg.  He notices this mostly when climbing stairs.  The patient is tolerating medications without difficulties and is otherwise without complaint today.    Current Medications (verified): 1)  Lovaza 1 Gm Caps (Omega-3-Acid Ethyl Esters) .Marland Kitchen.. 1 Tab Bid 2)  Lipitor 80 Mg Tabs (Atorvastatin Calcium) .Marland Kitchen.. 1 Daily 3)  Loperamide Hcl 2 Mg Caps (Loperamide Hcl) .... 2 Daily 4)  Allopurinol 100 Mg Tabs (Allopurinol) .Marland Kitchen.. 1 Daily 5)  Bd Insulin Syringe Ultrafine 31g X 5/16" 0.5 Ml Misc (Insulin Syringe-Needle U-100) .Marland Kitchen.. 1cc Use As Directed 6)  Humalog Kwikpen 100 Unit/ml Soln (Insulin Lispro (Human)) .... 30 U Two Times A Day 7)  Furosemide 80 Mg Tabs (Furosemide) .... 2 Tabs By Mouth in Am 2 Tabs By Mouth in Pm 8)  Kayexalate  Powd (Sodium Polystyrene Sulfonate) .... 2 Oz Daily 9)  Amlodipine Besylate 10 Mg Tabs (Amlodipine Besylate) .... Take One Tablet By Mouth Daily 10)  Fenofibrate Micronized 134 Mg Caps (Fenofibrate  Micronized) .Marland Kitchen.. 1 Qd 11)  Onetouch Ultra Test  Strp (Glucose Blood) .... Two Times A Day, and Lancets 250.00 12)  Calcitriol 0.25 Mcg Caps (Calcitriol) .Marland Kitchen.. 1 Tab By Mouth Sun, T, Th, Sat 2 Tabs By Mouth M W F 13)  Sodium Bicarbonate 650 Mg Tabs (Sodium Bicarbonate) .... Two Times A Day 14)  Aranesp (Albumin Free) 150 Mcg/0.10ml Soln (Darbepoetin Alfa-Polysorbate) .... Q3 Weeks 15)  Prilosec 20 Mg Cpdr (Omeprazole) .... At Bedtime 16)  Coumadin 5 Mg Tabs (Warfarin Sodium) .... Take As Directed 17)  Eliphos 667 Mg Tabs (Calcium Acetate (Phos Binder)) .... 2 Tablets By Mouth At Each Meal 18)  Carvedilol 12.5 Mg Tabs (Carvedilol) .Marland Kitchen.. 1 Tablet By Mouth Once Daily  Allergies: No Known Drug Allergies  Past History:  Past Medical History: Reviewed history from 09/24/2009 and no changes required. 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  s/p aflutter ablation - dr Graciela Husbands s/p atypical atrial flutter ablaiton Dr Johney Frame 07/08/09 s/p pilonidal cyst s/p right arm surgury after fracture planned - [facility] (right) for march 2010 - dr duda  Social History: Reviewed history from 01/02/2010 and no changes required. Current Smoker (<1/2 PPD) Married 3 children Alcohol use-no occasional  marijuana work - sales rep - [employer]  Review of Systems       All systems are reviewed and negative except as listed in the HPI.   Vital Signs:  Patient profile:   60 year old male Height:      73 inches Weight:      235.50 pounds BMI:     31.18 Pulse rate:   80 / minute Pulse rhythm:   irregular BP sitting:   126 / 78  (right arm) Cuff size:   regular  Vitals Entered By: Scherrie Bateman, LPN (June 19, 2010 12:14 PM)  Physical Exam  General:  normal appearance.   Head:  normocephalic and atraumatic Eyes:  PERRLA/EOM intact; conjunctiva and lids normal. Mouth:  Teeth, gums and palate normal. Oral mucosa normal. Neck:  supple Lungs:  Clear bilaterally to auscultation and percussion. Heart:  RRR, no m/r/g Abdomen:  Bowel sounds positive; abdomen soft and non-tender without masses, organomegaly, or hernias noted. No hepatosplenomegaly. Msk:  muscle bulk and strength are grossly normal.  no obvious joint swelling.  gait is normal and steady  Extremities:  there is a healed av shunt at the left elbow.   Neurologic:  cn 2-12 grossly intact.   strength/sensation are grossly intact with very mild diffuse strength reduction of left arm and leg  Skin:  normal texture and temp.  no rash.  not diaphoretic  Psych:  Alert and cooperative; normal mood and affect; normal attention span and concentration.     EKG  Procedure date:  06/19/2010  Findings:      sinus rhythm, first degree AV block (PR 336 ms), nonspecific ST/T changes, RsR'  Impression & Recommendations:  Problem # 1:  ATRIAL FIBRILLATION (ICD-427.31) continue coumadin longterm he is maintaining sinus rhythm  Problem # 2:  SVT/ PSVT/ PAT (ICD-427.0) prior atypical atrial flutter and AVNRT have not returned post ablation asymptomatic first degree AV block with prior mobitz I second degree AV block improved/ asymptomatic  Problem # 3:  RENAL INSUFFICIENCY (ICD-588.9) stable per nephrology  Problem # 4:   CHRONIC SYSTOLIC HEART FAILURE (ICD-428.22) stable no changes today prior nuclear study and echo were reviewed with the patient.  As he has no symptoms of ischemia, we will plan medical therapy and avoid cath.  I would avoid cath if at all possible given his severe renal impairment.  Problem # 5:  HYPERTENSION (ICD-401.9) stable His updated medication list for this problem includes:    Furosemide 80 Mg Tabs (Furosemide) .Marland Kitchen... 2 tabs by mouth in am 2 tabs by mouth in pm    Amlodipine Besylate 10 Mg Tabs (Amlodipine besylate) .Marland Kitchen... Take one tablet by mouth daily    Carvedilol 12.5 Mg Tabs (Carvedilol) .Marland Kitchen... 1 tablet by mouth once daily  Problem # 6:  HYPERLIPIDEMIA (ICD-272.4) given weakness and pain in L arm/leg, I have decreased lipitor to 40mg  at this time  I have instructed the patient to follow-up with his PCP to further evaluate these symptoms.  Given their gradual onset, I think CVA is unlikely.  He could have progressive peripheral neuropathy or other causes.  He has scheduled follow-up with  his PCP in the next few days.  Patient Instructions: 1)  Your physician has recommended you make the following change in your medication: Lipitor-take 1/2 tablet daily. 2)  Your physician wants you to follow-up in: 6 months  You will receive a reminder letter in the mail two months in advance. If you don't receive a letter, please call our office to schedule the follow-up appointment. Prescriptions: LIPITOR 40 MG TABS (ATORVASTATIN CALCIUM) once daily  #90 x 3   Entered by:   Claris Gladden RN   Authorized by:   Hillis Range, MD   Signed by:   Claris Gladden RN on 06/19/2010   Method used:   Electronically to        [facility] (retail)       [address]       [place], [place]  [id number]       Ph: [phone] or [phone]       Fax: [phone]   RxID:   [id number]

## 2010-07-16 NOTE — Letter (Signed)
Summary: Appointment - Reschedule  [facility]  [address] [place], [place] [address]   Phone: [phone]  Fax: [phone]     June 12, 2010 MRN: [id number]   [name] [address] [place], [place]  [address]   Dear [name],   Due to a change in our office schedule, your appointment on Jan 4th,2012                 at    11am  must be changed.  It is very important that we reach you to reschedule this appointment. We look forward to participating in your health care needs. Please contact us at [phone] option 2 for appointments at your earliest convenience to reschedule this appointment.     Sincerely,  [facility] Scheduling Team

## 2010-07-16 NOTE — Progress Notes (Signed)
Summary: rx refill req  Phone Note Refill Request Message from:  Fax from Pharmacy on July 06, 2010 1:29 PM  Refills Requested: Medication #1:  PRILOSEC 20 MG CPDR at bedtime   Dosage confirmed as above?Dosage Confirmed   Last Refilled: 05/29/2010  Method Requested: Electronic Next Appointment Scheduled: 07/08/2010 Initial call taken by: Brenton Grills CMA (AAMA),  July 06, 2010 1:32 PM    Prescriptions: PRILOSEC 20 MG CPDR (OMEPRAZOLE) at bedtime  #30 Capsule x 3   Entered by:   Brenton Grills CMA (AAMA)   Authorized by:   Minus Breeding MD   Signed by:   Brenton Grills CMA (AAMA) on 07/06/2010   Method used:   Electronically to        [facility] (retail)       [address]       [place], [place]  [id number]       Ph: [phone] or [phone]       Fax: [phone]   RxID:   [id number]

## 2010-07-16 NOTE — Letter (Signed)
Summary: [facility]  [address]   [place], [place] [address]   Phone: [phone]  Fax: [phone]     July 07, 2010 MRN: [id number]   [name] [address] [place], [place]  [address]   Dear [name],  This letter is being sent to you as a reminder that it is necessary for you to get your INR/PT checked regularly so that we can optimize your care.  Our records indicate that you were scheduled to have a test done recently.  As of today, we have not received the results of this test.  It is very important that you have your INR checked.  Please call our office at the number listed above to schedule an appointment at your earliest convenience.    If you have recently had your protime checked or have discontinued this medication, please contact our office at the above phone number to clarify this issue.  Thank you for this prompt attention to this important health care matter.  Sincerely,   [facility] Team

## 2010-07-17 ENCOUNTER — Ambulatory Visit (HOSPITAL_COMMUNITY)
Admission: RE | Admit: 2010-07-17 | Discharge: 2010-07-17 | Disposition: A | Discharge status: Home / Self Care | Payer: BC Managed Care – PPO | Source: Ambulatory Visit | Attending: Endocrinology | Admitting: Endocrinology

## 2010-07-17 ENCOUNTER — Encounter (HOSPITAL_COMMUNITY): Payer: Self-pay

## 2010-07-17 DIAGNOSIS — I679 Cerebrovascular disease, unspecified: Secondary | ICD-10-CM | POA: Insufficient documentation

## 2010-07-17 DIAGNOSIS — R531 Weakness: Secondary | ICD-10-CM

## 2010-07-17 DIAGNOSIS — I10 Essential (primary) hypertension: Secondary | ICD-10-CM | POA: Insufficient documentation

## 2010-07-17 DIAGNOSIS — N19 Unspecified kidney failure: Secondary | ICD-10-CM | POA: Insufficient documentation

## 2010-07-17 DIAGNOSIS — E119 Type 2 diabetes mellitus without complications: Secondary | ICD-10-CM | POA: Insufficient documentation

## 2010-07-17 DIAGNOSIS — M6281 Muscle weakness (generalized): Secondary | ICD-10-CM | POA: Insufficient documentation

## 2010-07-20 ENCOUNTER — Encounter: Payer: Self-pay | Admitting: Internal Medicine

## 2010-07-21 ENCOUNTER — Ambulatory Visit (INDEPENDENT_AMBULATORY_CARE_PROVIDER_SITE_OTHER): Payer: BC Managed Care – PPO | Admitting: Gastroenterology

## 2010-07-21 ENCOUNTER — Encounter: Payer: Self-pay | Admitting: Gastroenterology

## 2010-07-21 ENCOUNTER — Encounter (INDEPENDENT_AMBULATORY_CARE_PROVIDER_SITE_OTHER): Payer: Self-pay | Admitting: *Deleted

## 2010-07-21 DIAGNOSIS — E119 Type 2 diabetes mellitus without complications: Secondary | ICD-10-CM

## 2010-07-21 DIAGNOSIS — R197 Diarrhea, unspecified: Secondary | ICD-10-CM | POA: Insufficient documentation

## 2010-07-21 DIAGNOSIS — J4489 Other specified chronic obstructive pulmonary disease: Secondary | ICD-10-CM

## 2010-07-21 DIAGNOSIS — J449 Chronic obstructive pulmonary disease, unspecified: Secondary | ICD-10-CM

## 2010-07-21 DIAGNOSIS — N19 Unspecified kidney failure: Secondary | ICD-10-CM

## 2010-07-21 DIAGNOSIS — I4821 Permanent atrial fibrillation: Secondary | ICD-10-CM | POA: Insufficient documentation

## 2010-07-21 DIAGNOSIS — D689 Coagulation defect, unspecified: Secondary | ICD-10-CM

## 2010-07-21 HISTORY — DX: Other specified chronic obstructive pulmonary disease: J44.89

## 2010-07-21 HISTORY — DX: Chronic obstructive pulmonary disease, unspecified: J44.9

## 2010-07-22 NOTE — Assessment & Plan Note (Signed)
Summary: 3 MTH FU  STC   Vital Signs:  Patient profile:   60 year old male Height:      73 inches Weight:      235.50 pounds BMI:     31.18 O2 Sat:      95 % on Room air Temp:     98.5 degrees F oral Pulse rate:   92 / minute BP sitting:   142 / 68  (right arm) Cuff size:   regular  Vitals Entered By: Margaret Pyle, CMA (July 08, 2010 2:05 PM)  O2 Flow:  Room air CC: 3 mth f/u - DM Comments Pt states he has decreased strength in LT arm   Referring Provider:  Oliver Barre Primary Provider:  Oliver Barre  CC:  3 mth f/u - DM.  History of Present Illness: pt states 6 mod of gradual onset weakness at the left upper extremity.  no assoc pain. no cbg record, but states cbg's are "high."  he takes humalog 30 units two times a day (he says he skips breakfast).  he says it is high in the am, due to eating hs-snack, for which he takes no insulin.    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         there is also moderate numbness of the left little finger.    Physical Exam  General:  normal appearance.   Msk:  motor function is decreased throughout the left upper extremity. Pulses:  radial is  intact bilaterally Extremities:  no deformity of the hands.  full rom of the joints of the lue, without pain Neurologic:  sensation is intact to touch on the arms, except decreased on the left little finger Additional Exam:   Hemoglobin A1C       [H]  9.5 %     Impression & Recommendations:  Problem # 1:  WEAKNESS, LEFT SIDE OF BODY (ICD-342.90) Assessment New uncertain etiology  Problem # 2:  DIABETES MELLITUS, TYPE II (ICD-250.00) needs increased rx  Medications Added to Medication List This Visit: 1)  Humalog Kwikpen 100 Unit/ml Soln (Insulin lispro (human)) .... Three times a day (just before each meal) 30-30-10 units, and pen needles three times a day 2)  Temazepam  .... At bedtime  Other Orders: Radiology Referral (Radiology) TLB-B12, Serum-Total ONLY (54098-J19) TLB-A1C / Hgb A1C (Glycohemoglobin) (83036-A1C) Est. Patient Level IV ([address]782)  Patient Instructions: 1)  blood tests are being ordered for you today.  please call [phone] to hear your test results.   2)  pending the test results, please increase humalog to three times a day (just before each meal) 30-30-10 units (lunch, supper, and bedtime).   3)  check mri of the brain.  you will be called with a day and time for an appointment.  if this does  not explain the symptoms, i'll refer you to a neurologist. 4)  check your blood sugar 2 times a day.  vary the time of day when you check, between before the 3 meals, and at bedtime.  also check if you have symptoms of your blood sugar being too high or too low.  please keep a record of the readings and bring it to your next appointment here.  please call us sooner if you are having low blood sugar episodes. 5)  Please schedule a follow-up appointment in 1 month.   Orders Added: 1)  Radiology Referral [Radiology] 2)  TLB-B12, Serum-Total ONLY [82607-B12] 3)  TLB-A1C / Hgb A1C (Glycohemoglobin) [83036-A1C] 4)  Est. Patient Level IV [[address]]

## 2010-07-22 NOTE — Procedures (Signed)
Summary: Endoscopy & Pathology   EGD  Procedure date:  02/01/2001  Findings:      Location: [facility]   Patient Name: [name] MRN: [id number] Procedure Procedures: Panendoscopy (EGD) CPT: 43235.    with biopsy(s)/brushing(s). CPT: D1846139.    with esophageal dilation. CPT: G9296129.  Personnel: Endoscopist: Vania Rea. Jarold Motto, MD.  Referred By: Margrett Rud, MD.  Exam Location: Exam performed in Endoscopy Suite.  Patient Consent: Procedure, Alternatives, Risks and Benefits discussed, consent obtained,  Indications Symptoms: Early satiety. Vomiting. Reflux symptoms  History  Pre-Exam Physical: Performed Feb 01, 2001  Cardio-pulmonary exam, Abdominal exam, Extremity exam, Mental status exam WNL.  Exam Exam Info: Maximum depth of insertion Duodenum, intended Duodenum. Patient position: on left side. Duration of exam: 15 minutes. Vocal cords visualized. Gastric retroflexion performed. Images taken. ASA Classification: II. Tolerance: excellent.  Sedation Meds: Fentanyl 125 mcg. Versed 8.5.  Monitoring: BP and pulse monitoring done. Oximetry used. Supplemental O2 given  Fluoroscopy: Fluoroscopy was not used.  Instrument(s): GIF 160. Serial [id number]. Serial #0.   Findings - Normal: Proximal Esophagus to Fundus. Not Seen: Ulcer. Barrett's esophagus. Esophageal inflammation. Mucosal abnormality. Stricture.  - Dilation: Proximal Esophagus. for dysphagia without stricture. Hurst dilator used, Diameter: 18 mm, No Resistance, No Heme present on extraction. Patient tolerance excellent. Outcome: successful.  - OTHER FINDING: in Fundus. Comments: large amount of clear bile-like fluid noted.  - POLYP: in Duodenal Bulb. Maximum size: 15 mm. sessile polyp. 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  - DIAGNOSTIC TEST: from Body RUT done, results pending   Assessment  Diagnoses: 211.9:  Neoplasia, Benign, GI Tract, NEC/NOS.   Comments: Empiric esophageal dilation preformed.WE may need to excise this duodenal polyp! Events  Unplanned Intervention: No unplanned interventions were required.  Plans Medication(s): Await pathology. Continue current medications.  Patient Education: Patient given standard instructions for: Reflux.  Disposition: After procedure patient sent to recovery. After recovery patient sent home.  Scheduling: Await pathology to schedule patient. UGI/small bowel follow through, to [facility]. Jarold Motto, MD, around Feb 08, 2001.    CC: Margrett Rud, MD    SP Surgical Pathology - STATUS: Final             By: Morrie Sheldon,       Perform Date: 21Aug02 06:59  Ordered By: Juanetta Beets        Ordered Date:  Facility: [facility]                              Department: CPATH  Service Report Text  [facility]   [address] [place], [place] [address]   [phone]    REPORT OF SURGICAL PATHOLOGY    Case #: [id number]   Patient Name: [name]   PID: [id number]   Pathologist: Beulah Gandy. Luisa Hart, MD   DOB[birth year]Age [birth year] (Age: 60) Gender: M   Date Taken: 02/01/2001   Date Received: 02/01/2001    FINAL DIAGNOSIS    ***MICROSCOPIC EXAMINATION AND DIAGNOSIS***    DUODENAL BIOPSIES: GASTRIC TYPE MUCOSA WITH FOCAL GOBLET CELLS,   MILD INFLAMMATION AND REACTIVE CHANGES. SEE COMMENT.     COMMENT   There are fragments of glandular mucosa with features of gastric   type mucosa. Focally, there are goblet cells present and the   stroma contains patchy,  mild chronic inflammation. 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   Within the stomach the foveolar changes would be indicative of   NSAIDS, alcohol or bile reflux gastropathy. Focally, one of the   fragments shows glands with slight nuclear stratification and   decreased epithelial mucin. The features are consistent  with   focal reactive glandular atypia rather than adenomatous change or   dysplasia. Additional levels are performed and the findings are   similar. JDP:mw 8-23    mw   Date Reported: 02/03/2001 Beulah Gandy. Luisa Hart, MD   *** Electronically Signed Out By JDP ***    Clinical information   R/O ADENOMA, GASTRONOUS, CARCINOID (AH)    specimen(s) obtained   BX DUODENAL POLYP    Gross Description   Received in formalin are tan, soft tissue fragments that are   submitted in toto. Number: 3   Size: 0.2 0.3 cm. (JBM:jn, 02/01/01)    jn/

## 2010-07-22 NOTE — Procedures (Signed)
Summary: Endo & Pathology ---Dr. Virginia Rochester   EGD  Procedure date:  02/07/2007  Findings:      Location: [facility]   NAME:  LEEVON, [name]                 ACCOUNT NO.:  [id number]      MEDICAL RECORD NO.:  [id number]          PATIENT TYPE:  AMB      LOCATION:  ENDO                         FACILITY:  [facility]      PHYSICIAN:  Georgiana Spinner, M.D.    DATE OF BIRTH:  Jul 24, 1950      DATE OF PROCEDURE:  02/07/2007   DATE OF DISCHARGE:                                  OPERATIVE REPORT      PROCEDURE:  Upper endoscopy with biopsy.      INDICATIONS:  GERD.      ANESTHESIA:  Fentanyl 100 mcg, Versed 7 mg, Benadryl 25 mg.      PROCEDURE:  With the patient mildly sedated in the left lateral   decubitus position, the Pentax videoscopic endoscope was inserted in the   mouth, passed under direct vision through the esophagus which appeared   normal.  There was no evidence of Barrett's esophagus.  We entered into   the stomach.  Fundus, body, antrum appeared normal.  Duodenal bulb,   however, showed a fairly large polyp on a stalk.  This was photographed   and biopsied only.  Second portion duodenum appeared normal.  From this   point the endoscope was slowly withdrawn taking circumferential views of   duodenal mucosa until the endoscope had been pulled back into the   stomach, placed in retroflexion to view the stomach from below.  The   endoscope was straightened and withdrawn, taking circumferential views   of the remaining gastric and esophageal mucosa.  The patient's vital   signs and pulse oximeter remained stable.  The patient tolerated the   procedure well without apparent complications.      FINDINGS:  Polyp of duodenum, biopsied.  Await biopsy report.  The   patient to resume Coumadin therapy.  The patient will call me for   results of biopsy and follow-up with me as an outpatient.                  ______________________________   Georgiana Spinner, M.D.            GMO/MEDQ   D:  02/07/2007  T:  02/07/2007  Job:  [id number]    SP-Surgical Pathology - STATUS: Final  .                                         Perform Date: 26Aug08 00:01  Ordered By: Jarvis Newcomer,            Ordered Date: 26Aug08 10:26  Facility: [facility]                              Department: CPATH  Service Report Text  [facility]   [address] [place], [place] [address]   [phone]    REPORT OF SURGICAL PATHOLOGY    Case #: [id number]   Patient Name: [name], [name]   Office Chart Number: N/A    MRN: [id number]   Pathologist: H. Hollice Espy, MD   DOB/Age 25-Feb-1951 (Age: 61) Gender: M   Date Taken: 02/07/2007   Date Received: 02/07/2007    FINAL DIAGNOSIS    ***MICROSCOPIC EXAMINATION AND DIAGNOSIS***    DUODENUM, POLYP, BIOPSY: CHRONIC DUODENITIS CONSISTENT WITH   PEPTIC DUODENITIS. NO VILLI ATROPHY, HELICOBACTER PYLORI OR   MALIGNANCY IDENTIFIED.    COMMENT   Warthin Starry stain negative for Helicobacter pylori. Controls   appropriate.    kv   Date Reported: 02/08/2007 H. Hollice Espy, MD   *** Electronically Signed Out By [facility] ***    Clinical information   (as)    specimen(s) obtained   Duodenum, biopsy, polyp    Gross Description   Received in formalin are tan, soft tissue fragments that are   submitted in toto. Number: multiple.   Size: 0.4 x 0.3 x 0.2 cm, one block. (SW:gt, 02/07/07)    gdt/   Addendum    Addendum Comment   At the request of Dr. 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 No adenomatous change or malignancy is   identified. (JDP:kv 06-14-07)    Beulah Gandy. Luisa Hart, MD    DATE REPORTED:06/20/2007   Additional Information  HL7 RESULT STATUS : F  External IF Update Timestamp : 2007-02-07:10:29:00.000000

## 2010-07-30 ENCOUNTER — Encounter (INDEPENDENT_AMBULATORY_CARE_PROVIDER_SITE_OTHER): Payer: Self-pay | Admitting: *Deleted

## 2010-07-30 NOTE — Letter (Signed)
Summary: [facility] Instructions  [facility]  [address] [place], [place] [address]   Phone: [phone]  Fax: [phone]       [name]    01-04-1951    MRN: [id number]        Procedure Day /Date: Friday 08/14/2010     Arrival Time: 9:30am     Procedure Time: 10:30am     Location of Procedure:                    X  [facility] (4th Floor)   PREPARATION FOR COLONOSCOPY WITH MOVIPREP   Starting 5 days prior to your procedure 08/09/2010 do not eat nuts, seeds, popcorn, corn, beans, peas,  salads, or any raw vegetables.  Do not take any fiber supplements (e.g. Metamucil, Citrucel, and Benefiber).  THE DAY BEFORE YOUR PROCEDURE         Thursday 08/13/2010  1.  Drink clear liquids the entire day-NO SOLID FOOD  2.  Do not drink anything colored red or purple.  Avoid juices with pulp.  No orange juice.  3.  Drink at least 64 oz. (8 glasses) of fluid/clear liquids during the day to prevent dehydration and help the prep work efficiently.  CLEAR LIQUIDS INCLUDE: Water Jello Ice Popsicles Tea (sugar ok, no milk/cream) Powdered fruit flavored drinks Coffee (sugar ok, no milk/cream) Gatorade Juice: apple, white grape, white cranberry  Lemonade Clear bullion, consomm, broth Carbonated beverages (any kind) Strained chicken noodle soup Hard Candy                             4.  In the morning, mix first dose of MoviPrep solution:    Empty 1 Pouch A and 1 Pouch B into the disposable container    Add lukewarm drinking water to the top line of the container. Mix to dissolve    Refrigerate (mixed solution should be used within 24 hrs)  5.  Begin drinking the prep at 5:00 p.m. The MoviPrep container is divided by 4 marks.   Every 15 minutes drink the solution down to the next mark (approximately 8 oz) until the full liter is complete.   6.  Follow completed prep with 16 oz of clear liquid of your choice (Nothing red or purple).  Continue to drink  clear liquids until bedtime.  7.  Before going to bed, mix second dose of MoviPrep solution:    Empty 1 Pouch A and 1 Pouch B into the disposable container    Add lukewarm drinking water to the top line of the container. Mix to dissolve    Refrigerate  THE DAY OF YOUR PROCEDURE      Friday 08/14/2010  Beginning at 5:30am (5 hours before procedure):         1. Every 15 minutes, drink the solution down to the next mark (approx 8 oz) until the full liter is complete.  2. Follow completed prep with 16 oz. of clear liquid of your choice.    3. You may drink clear liquids until 8:30am (2 HOURS BEFORE PROCEDURE).   MEDICATION INSTRUCTIONS  Unless otherwise instructed, you should take regular prescription medications with a small sip of water   as early as possible the morning of your procedure.  Diabetic patients - see separate instructions.      Stop taking Coumadin on  08/09/2010.Marland KitchenMarland KitchenPer Dr. Jarold Motto  (5 days before procedure).  OTHER INSTRUCTIONS  You will need a responsible adult at least 60 years of age to accompany you and drive you home.   This person must remain in the waiting room during your procedure.  Wear loose fitting clothing that is easily removed.  Leave jewelry and other valuables at home.  However, you may wish to bring a book to read or  an iPod/MP3 player to listen to music as you wait for your procedure to start.  Remove all body piercing jewelry and leave at home.  Total time from sign-in until discharge is approximately 2-3 hours.  You should go home directly after your procedure and rest.  You can resume normal activities the  day after your procedure.  The day of your procedure you should not:   Drive   Make legal decisions   Operate machinery   Drink alcohol   Return to work  You will receive specific instructions about eating, activities and medications before you leave.    The above instructions have been reviewed and  explained to me by   _______________________    I fully understand and can verbalize these instructions _____________________________ Date _________

## 2010-07-30 NOTE — Assessment & Plan Note (Signed)
Summary: DIR COL, COUMADIN & DIABETIC.Marland KitchenAS   SCHED WITH PT/BCBS COPAY,M...    History of Present Illness Visit Type: Initial Consult Primary GI MD: Sheryn Bison MD FACP FAGA Primary Provider: Lorna Few Requesting Provider: Lorna Few Chief Complaint: Discuss Colonoscopy, No GI Complaints, Here to today to discuss colon because pt is having a kidney transplant and the colonoscopy is required first History of Present Illness:   Extremely pleasant but extremely complicated 60 year old Caucasian male with chronic renal insufficiency and planned renal transplant,all secondary to 30 years of diabetes and insulin dependency. He is followed closely by Dr. Briant Cedar in nephrology, and Dr. Johney Frame in cardiology. He is referred today for consideration of colonoscopy before planned renal transplantation.  [name] denies any gastrointestinal complaints except for chronic diarrhea related to Kayexalate. He has had chronic acid reflux which is managed with Prilosec 20 mg at bedtime and sodium bicarbonate 650 mg twice a day. He denies dysphagia,or  recurrent reflux problems. He denies melena, hematochezia, hepatobiliary complaints, and has had endoscopy manyl years ago which was unremarkable, and colonoscopy apparently 10 years ago by Dr.Orr.  He has an involved cardiac history revolving around atrial fibrillation requiring Coumadin administration, hyperlipidemia, hypertension secondary to chronic renal failure, peripheral neuropathy from his diabetes,and he has recently had a shunt placed in his left brachial area. He smokes a half a pack of cigarettes per day but denies ethanol abuse.  To me he denies any active cardiovascular or pulmonary complaints but has markedly diminished exercise tolerance. He also has chronic osteoporosis in his right foot and is under the care of Dr. [name] in orthopedics. He'll multiple medications listed and reviewed his chart. There is no family history of colon carcinoma or  colonic polyposis.He also has a history of COPD related to chronic cigarette use.   GI Review of Systems      Denies abdominal pain, acid reflux, belching, bloating, chest pain, dysphagia with liquids, dysphagia with solids, heartburn, loss of appetite, nausea, vomiting, vomiting blood, weight loss, and  weight gain.      Reports diarrhea.     Denies anal fissure, black tarry stools, change in bowel habit, constipation, diverticulosis, fecal incontinence, heme positive stool, hemorrhoids, irritable bowel syndrome, jaundice, light color stool, liver problems, rectal bleeding, and  rectal pain. Preventive Screening-Counseling & Management      Drug Use:  yes.      Current Medications (verified): 1)  Lovaza 1 Gm Caps (Omega-3-Acid Ethyl Esters) .Marland Kitchen.. 1 Tab Bid 2)  Lipitor 40 Mg Tabs (Atorvastatin Calcium) .... Once Daily 3)  Loperamide Hcl 2 Mg Caps (Loperamide Hcl) .... 2 Daily 4)  Allopurinol 100 Mg Tabs (Allopurinol) .Marland Kitchen.. 1 Daily 5)  Bd Insulin Syringe Ultrafine 31g X 5/16" 0.5 Ml Misc (Insulin Syringe-Needle U-100) .Marland Kitchen.. 1cc Use As Directed 6)  Humalog Kwikpen 100 Unit/ml Soln (Insulin Lispro (Human)) .... Three Times A Day (Just Before Each Meal) 30-30-10 Units, and Pen Needles Three Times A Day 7)  Furosemide 80 Mg Tabs (Furosemide) .... 2 Tabs By Mouth in Am 2 Tabs By Mouth in Pm 8)  Kayexalate  Powd (Sodium Polystyrene Sulfonate) .... 2 Oz Daily 9)  Amlodipine Besylate 10 Mg Tabs (Amlodipine Besylate) .... Take One Tablet By Mouth Daily 10)  Fenofibrate Micronized 134 Mg Caps (Fenofibrate Micronized) .Marland Kitchen.. 1 Qd 11)  Onetouch Ultra Test  Strp (Glucose Blood) .... Two Times A Day, and Lancets 250.00 12)  Calcitriol 0.25 Mcg Caps (Calcitriol) .Marland Kitchen.. 1 Tab By Octavio Graves, Th, Sat  2 Tabs By Mouth M W F 13)  Sodium Bicarbonate 650 Mg Tabs (Sodium Bicarbonate) .... Two Times A Day 14)  Aranesp (Albumin Free) 150 Mcg/0.25ml Soln (Darbepoetin Alfa-Polysorbate) .... Q3 Weeks 15)  Prilosec 20 Mg  Cpdr (Omeprazole) .... At Bedtime 16)  Coumadin 5 Mg Tabs (Warfarin Sodium) .... Take As Directed 17)  Eliphos 667 Mg Tabs (Calcium Acetate (Phos Binder)) .... 2 Tablets By Mouth At Each Meal 18)  Carvedilol 12.5 Mg Tabs (Carvedilol) .Marland Kitchen.. 1 Tablet By Mouth Once Daily 19)  Temazepam .... At Bedtime  Allergies (verified): No Known Drug Allergies  Past History:  Social History: Last updated: 07/21/2010 Current Smoker (<1/2 PPD) Married 3 children Alcohol use-no occasional marijuana work - sales rep - [employer] Illicit Drug Use - yes  Past medical, surgical, family and social histories (including risk factors) reviewed for relevance to current acute and chronic problems.  Past Medical History: Reviewed history from 09/24/2009 and no changes required. 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 s/p aflutter ablation - dr Graciela Husbands s/p atypical atrial flutter ablaiton Dr Johney Frame 07/08/09 s/p pilonidal cyst s/p right arm surgury after fracture planned - [facility] (right) for march 2010 - dr duda  Family History: Reviewed history from 08/19/2008 and no changes required. DM - both grandmothers and father mother with GBMF - died 4yo father died age 33 yo with aneurysm No FH of  Colon Cancer:  Social History: Reviewed history from 01/02/2010 and no changes required. Current Smoker (<1/2 PPD) Married 3 children Alcohol use-no occasional marijuana work - sales rep - [employer] Illicit Drug Use - yes Drug Use:  yes  Review of Systems       The patient complains of arthritis/joint pain, fatigue, heart rhythm changes, and shortness of breath.  The patient denies allergy/sinus, anemia, anxiety-new, back pain, blood in urine, breast changes/lumps, change in vision, confusion, cough, coughing up blood, depression-new, fainting, fever, headaches-new, hearing problems, heart murmur, itching, muscle pains/cramps, night sweats, nosebleeds, skin rash, sleeping problems, sore throat, swelling of feet/legs, swollen lymph glands, thirst - excessive, urination - excessive, urination changes/pain, urine leakage, vision changes, and voice change.         chronic diarrhea secondary to medications. General:  Denies fever, chills, sweats, anorexia, fatigue, weakness, malaise, weight loss, and sleep disorder. CV:  Complains of dyspnea on exertion and peripheral edema; denies chest pains, angina, palpitations, syncope, orthopnea, PND, and claudication. Resp:  Complains of dyspnea with exercise and wheezing; denies dyspnea at rest, cough, sputum, coughing up blood, and pleurisy. 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 Derm:  Complains of itching; denies rash, hives, moles, warts, and unhealing ulcers. Neuro:  Complains of abnormal sensation and radiculopathy other:; denies weakness, paralysis, seizures, syncope, tremors, vertigo, transient blindness, frequent falls, frequent headaches,  difficulty walking, headache, sciatica, restless legs, memory loss, and confusion. Psych:  Denies depression, anxiety, memory loss, suicidal ideation, hallucinations, paranoia, phobia, and confusion. Endo:  Denies cold intolerance, heat intolerance, polydipsia, polyphagia, polyuria, unusual weight change, and hirsutism. Heme:  Complains of bruising; denies bleeding, enlarged lymph nodes, and pagophagia.  Vital Signs:  Patient profile:   60 year old male Height:      73 inches Weight:      237 pounds BMI:     31.38 BSA:     2.31 Pulse rate:   86 / minute Pulse rhythm:   irregular BP sitting:   128 / 64  (left arm)  Vitals Entered By: [name] CMA Duncan Dull) (July 21, 2010 2:58 PM)  Physical Exam  General:  Well developed, well nourished, no acute distress.Chronically ill-appearing white male in no acute distress. Head:  Normocephalic and atraumatic. Eyes:  PERRLA, no icterus.exam deferred to patient's ophthalmologist.   Lungs:  decreased BS on L, decreased BS on R, and wheezes bilateral.   Heart:  Regular rate and rhythm; no murmurs, rubs,  or bruits.irregular rhythm:.   Abdomen:  Soft, nontender and nondistended. No masses, hepatosplenomegaly or hernias noted. Normal bowel sounds. Rectal:  deferred until time of colonoscopy.   Msk:  right foot wrapped per orthopedic care Extremities:  1+ pedal edema.   Neurologic:  Alert and  oriented x4;  grossly normal neurologically. Cervical Nodes:  No significant cervical adenopathy. Psych:  Alert and cooperative. Normal mood and affect.   Impression & Recommendations:  Problem # 1:  DIARRHEA (ICD-787.91) Assessment Unchanged Related to Multiple Medications, especially Kayexalate for hyperkalemia associated with his chronic renal failure. Colonoscopy has been scheduled at his convenience with standard balanced electrolyte solution prep and close monitorization, for this procedure we will have nurse anesthesia assistance and  administer propofol.  Problem # 2:  SCREENING COLORECTAL-CANCER (ICD-V76.51) Assessment: Unchanged Colonoscopy as scheduled with holding Coumadin 5 days before this procedure unless otherwise instructed by Dr. Johney Frame in cardiology. We will make adjustments also in his diabetic insulin requirements before this procedure. I've explained to the patient in detail that he has had a marked increase risk for complications with any procedure, but apparently his transplant surgeons are adamant that he have colonoscopy before renal transplantation. Prior exam which probably by Dr. Sabino Gasser some 10 years ago. I do not have that report for review.  Problem # 3:  RENAL FAILURE-CHRONIC (ICD-585.9) Assessment: Unchanged Continue Multiple Medications per nephrology and Dr. Fidela Salisbury.  Problem # 4:  ATRIAL FIBRILLATION (ICD-427.31) Assessment: Unchanged Continue multiple medications per Dr.Allred in cardiology.  Problem # 5:  DIABETES MELLITUS, TYPE II (ICD-250.00) Assessment: Unchanged Adjustments in His Insulin therapy appropriate for his balanced electrolyte preparation and colonoscopy exam.  Problem # 6:  GERD (ICD-530.81) Assessment: Improved Clinically not a problem on omeprazole 20 mg a day. I do not think he needs followup endoscopy exam.  Problem # 7:  COPD (ICD-496) Assessment: Deteriorated  Patient Instructions: 1)  Copy sent to : Lorna Few and Dr.Allred in cardiology and Dr. Fidela Salisbury in nephrology 2)  We will call you when the April schedule comes out to schedule your Colonoscopy. When calls back colonscopy should be with MAC and He is to hold his coumadin 5 days per  Dr. Jarold Motto. 3)  The medication list was reviewed and reconciled.  All changed / newly prescribed medications were explained.  A complete medication list was provided to the patient / caregiver. Prescriptions: MOVIPREP 100 GM  SOLR (PEG-KCL-NACL-NASULF-NA ASC-C) As per prep instructions.  #1 x 0   Entered  by:   Harlow Mares CMA (AAMA)   Authorized by:   Mardella Layman MD [facility]   Signed by:   Harlow Mares CMA (AAMA) on 07/21/2010   Method used:   Electronically to        [facility] (retail)       [address]       [place], [place]  [id number]       Ph: [phone] or [phone]       Fax: [phone]   RxID:   [id number]

## 2010-07-30 NOTE — Letter (Signed)
Summary: Diabetic Instructions  [facility]  [address] [place], [place] [address]   Phone: [phone]  Fax: [phone]    [name] May 13, 1951 MRN: [id number]    X   INSULIN (SHORT ACTING) MEDICATION INSTRUCTIONS (Regular, Humulog, Novolog)   The day before your procedure:   Do not take your evening dose   The day of your procedure:   Do not take your morning dose

## 2010-08-03 ENCOUNTER — Other Ambulatory Visit: Payer: Self-pay

## 2010-08-03 ENCOUNTER — Encounter (HOSPITAL_COMMUNITY): Payer: BC Managed Care – PPO | Attending: Nephrology

## 2010-08-03 ENCOUNTER — Other Ambulatory Visit: Payer: Self-pay | Admitting: Nephrology

## 2010-08-03 DIAGNOSIS — D638 Anemia in other chronic diseases classified elsewhere: Secondary | ICD-10-CM | POA: Insufficient documentation

## 2010-08-03 DIAGNOSIS — N184 Chronic kidney disease, stage 4 (severe): Secondary | ICD-10-CM | POA: Insufficient documentation

## 2010-08-03 LAB — IRON AND TIBC
Iron: 108 ug/dL (ref 42–135)
Saturation Ratios: 29 % (ref 20–55)

## 2010-08-05 NOTE — Letter (Signed)
Summary: Dyke Maes MD/[facility]  Dyke Maes MD/[facility]   Imported By: Lester Barney 07/31/2010 08:11:13  _____________________________________________________________________  External Attachment:    Type:   Image     Comment:   External Document

## 2010-08-05 NOTE — Letter (Addendum)
Summary: Primary Care Consult Scheduled Letter  [facility]  [address] [place], [place] [address]   Phone: [phone]  Fax: [phone]      07/30/2010 MRN: [id number]  Julies Korte [address] [place], [place]  [address]  [place]    Dear Mr. Vitale,      We have scheduled an appointment for you. At the recommendation of Dr.Dr.Ellison, we have scheduled you a consult with [facility] (Dr Terrace Arabia) on April 24,2012 at 1:30 pm. Their address is [address], [place], [place] [address]. The office phone number is [phone] If this appointment day and time is not convenient for you, please feel free to call the office of the doctor you are being referred to at the number listed above and reschedule the appointment.     It is important for you to keep your scheduled appointments. We are here to make sure you are given good patient care. If you have questions or you have made changes to your appointment, please notify us at  [phone]        , ask for Debra.     Thank you,  Patient Care Coordinator [facility]

## 2010-08-11 ENCOUNTER — Encounter: Payer: Self-pay | Admitting: Internal Medicine

## 2010-08-14 ENCOUNTER — Encounter: Payer: BC Managed Care – PPO | Admitting: Gastroenterology

## 2010-08-17 ENCOUNTER — Encounter (HOSPITAL_COMMUNITY): Payer: BC Managed Care – PPO | Attending: Nephrology

## 2010-08-17 ENCOUNTER — Other Ambulatory Visit: Payer: Self-pay

## 2010-08-17 DIAGNOSIS — N184 Chronic kidney disease, stage 4 (severe): Secondary | ICD-10-CM | POA: Insufficient documentation

## 2010-08-17 DIAGNOSIS — D638 Anemia in other chronic diseases classified elsewhere: Secondary | ICD-10-CM | POA: Insufficient documentation

## 2010-08-17 LAB — POCT HEMOGLOBIN-HEMACUE: Hemoglobin: 11.6 g/dL — ABNORMAL LOW (ref 13.0–17.0)

## 2010-08-19 ENCOUNTER — Encounter: Payer: Self-pay | Admitting: Internal Medicine

## 2010-08-24 LAB — POCT HEMOGLOBIN-HEMACUE: Hemoglobin: 10.4 g/dL — ABNORMAL LOW (ref 13.0–17.0)

## 2010-08-25 LAB — IRON AND TIBC
Iron: 96 ug/dL (ref 42–135)
Saturation Ratios: 22 % (ref 20–55)
TIBC: 431 ug/dL (ref 215–435)

## 2010-08-26 ENCOUNTER — Encounter: Payer: Self-pay | Admitting: Internal Medicine

## 2010-08-26 DIAGNOSIS — Z7901 Long term (current) use of anticoagulants: Secondary | ICD-10-CM

## 2010-08-26 DIAGNOSIS — I4891 Unspecified atrial fibrillation: Secondary | ICD-10-CM

## 2010-08-27 LAB — POCT HEMOGLOBIN-HEMACUE
Hemoglobin: 11.6 g/dL — ABNORMAL LOW (ref 13.0–17.0)
Hemoglobin: 11.6 g/dL — ABNORMAL LOW (ref 13.0–17.0)

## 2010-08-28 LAB — POCT HEMOGLOBIN-HEMACUE: Hemoglobin: 11.3 g/dL — ABNORMAL LOW (ref 13.0–17.0)

## 2010-08-29 LAB — POCT HEMOGLOBIN-HEMACUE: Hemoglobin: 11.2 g/dL — ABNORMAL LOW (ref 13.0–17.0)

## 2010-08-29 LAB — RENAL FUNCTION PANEL
BUN: 73 mg/dL — ABNORMAL HIGH (ref 6–23)
CO2: 25 mEq/L (ref 19–32)
Calcium: 9.5 mg/dL (ref 8.4–10.5)
Creatinine, Ser: 6.16 mg/dL — ABNORMAL HIGH (ref 0.4–1.5)
Glucose, Bld: 216 mg/dL — ABNORMAL HIGH (ref 70–99)

## 2010-08-30 LAB — PROTIME-INR
INR: 1.89 — ABNORMAL HIGH (ref 0.00–1.49)
INR: 1.98 — ABNORMAL HIGH (ref 0.00–1.49)
INR: 2.77 — ABNORMAL HIGH (ref 0.00–1.49)
Prothrombin Time: 19 seconds — ABNORMAL HIGH (ref 11.6–15.2)
Prothrombin Time: 21.5 seconds — ABNORMAL HIGH (ref 11.6–15.2)

## 2010-08-30 LAB — GLUCOSE, CAPILLARY
Glucose-Capillary: 125 mg/dL — ABNORMAL HIGH (ref 70–99)
Glucose-Capillary: 158 mg/dL — ABNORMAL HIGH (ref 70–99)
Glucose-Capillary: 162 mg/dL — ABNORMAL HIGH (ref 70–99)
Glucose-Capillary: 176 mg/dL — ABNORMAL HIGH (ref 70–99)
Glucose-Capillary: 258 mg/dL — ABNORMAL HIGH (ref 70–99)
Glucose-Capillary: 263 mg/dL — ABNORMAL HIGH (ref 70–99)
Glucose-Capillary: 285 mg/dL — ABNORMAL HIGH (ref 70–99)
Glucose-Capillary: 325 mg/dL — ABNORMAL HIGH (ref 70–99)
Glucose-Capillary: 46 mg/dL — ABNORMAL LOW (ref 70–99)
Glucose-Capillary: 72 mg/dL (ref 70–99)
Glucose-Capillary: 78 mg/dL (ref 70–99)

## 2010-08-30 LAB — CBC
HCT: 27.4 % — ABNORMAL LOW (ref 39.0–52.0)
Hemoglobin: 9.2 g/dL — ABNORMAL LOW (ref 13.0–17.0)
MCHC: 32.9 g/dL (ref 30.0–36.0)
MCV: 84 fL (ref 78.0–100.0)
MCV: 84.7 fL (ref 78.0–100.0)
Platelets: 344 10*3/uL (ref 150–400)
RBC: 3.34 MIL/uL — ABNORMAL LOW (ref 4.22–5.81)
RDW: 15.1 % (ref 11.5–15.5)
WBC: 10.9 10*3/uL — ABNORMAL HIGH (ref 4.0–10.5)

## 2010-08-30 LAB — BASIC METABOLIC PANEL
BUN: 71 mg/dL — ABNORMAL HIGH (ref 6–23)
BUN: 71 mg/dL — ABNORMAL HIGH (ref 6–23)
CO2: 21 mEq/L (ref 19–32)
Calcium: 8.5 mg/dL (ref 8.4–10.5)
Chloride: 109 mEq/L (ref 96–112)
Chloride: 112 mEq/L (ref 96–112)
Creatinine, Ser: 5.95 mg/dL — ABNORMAL HIGH (ref 0.4–1.5)
Creatinine, Ser: 6.37 mg/dL — ABNORMAL HIGH (ref 0.4–1.5)
GFR calc non Af Amer: 9 mL/min — ABNORMAL LOW (ref >60)
Glucose, Bld: 119 mg/dL — ABNORMAL HIGH (ref 70–99)
Glucose, Bld: 273 mg/dL — ABNORMAL HIGH (ref 70–99)
Glucose, Bld: 65 mg/dL — ABNORMAL LOW (ref 70–99)
Potassium: 5.2 mEq/L — ABNORMAL HIGH (ref 3.5–5.1)
Sodium: 141 mEq/L (ref 135–145)

## 2010-08-30 LAB — URINALYSIS, ROUTINE W REFLEX MICROSCOPIC
Bilirubin Urine: NEGATIVE
Ketones, ur: NEGATIVE mg/dL
Leukocytes, UA: NEGATIVE
Nitrite: NEGATIVE
Urobilinogen, UA: 0.2 mg/dL (ref 0.0–1.0)
pH: 5.5 (ref 5.0–8.0)

## 2010-08-30 LAB — FERRITIN
Ferritin: 113 ng/mL (ref 22–322)
Ferritin: 46 ng/mL (ref 22–322)
Ferritin: 47 ng/mL (ref 22–322)

## 2010-08-30 LAB — HEPARIN LEVEL (UNFRACTIONATED)
Heparin Unfractionated: 0.16 IU/mL — ABNORMAL LOW (ref 0.30–0.70)
Heparin Unfractionated: 0.17 IU/mL — ABNORMAL LOW (ref 0.30–0.70)
Heparin Unfractionated: <0.1 IU/mL — ABNORMAL LOW (ref 0.30–0.70)

## 2010-08-30 LAB — IRON AND TIBC
Saturation Ratios: 9 % — ABNORMAL LOW (ref 20–55)
Saturation Ratios: 10 % — ABNORMAL LOW (ref 20–55)
Saturation Ratios: 10 % — ABNORMAL LOW (ref 20–55)
UIBC: 361 ug/dL
UIBC: 275 ug/dL
UIBC: 288 ug/dL

## 2010-08-30 LAB — PHOSPHORUS, URINE, RANDOM: Phosphorus, Ur: 42 mg/dL

## 2010-08-30 LAB — PTH, INTACT AND CALCIUM
Calcium, Total (PTH): 8.1 mg/dL — ABNORMAL LOW (ref 8.4–10.5)
PTH: 189.3 pg/mL — ABNORMAL HIGH (ref 14.0–72.0)

## 2010-08-30 LAB — APTT: aPTT: 54 seconds — ABNORMAL HIGH (ref 24–37)

## 2010-08-30 LAB — COMPREHENSIVE METABOLIC PANEL
ALT: 12 U/L (ref 0–53)
AST: 11 U/L (ref 0–37)
CO2: 18 mEq/L — ABNORMAL LOW (ref 19–32)
Calcium: 8.2 mg/dL — ABNORMAL LOW (ref 8.4–10.5)
Creatinine, Ser: 6.29 mg/dL — ABNORMAL HIGH (ref 0.4–1.5)
GFR calc Af Amer: 11 mL/min — ABNORMAL LOW (ref >60)
GFR calc non Af Amer: 9 mL/min — ABNORMAL LOW (ref >60)
Sodium: 137 mEq/L (ref 135–145)
Total Protein: 5.8 g/dL — ABNORMAL LOW (ref 6.0–8.3)

## 2010-08-30 LAB — HEPATITIS B SURFACE ANTIBODY,QUALITATIVE: Hep B S Ab: NEGATIVE

## 2010-08-30 LAB — RENAL FUNCTION PANEL
Albumin: 2.8 g/dL — ABNORMAL LOW (ref 3.5–5.2)
Calcium: 8 mg/dL — ABNORMAL LOW (ref 8.4–10.5)
GFR calc Af Amer: 12 mL/min — ABNORMAL LOW (ref >60)
GFR calc non Af Amer: 10 mL/min — ABNORMAL LOW (ref >60)
Phosphorus: 6.7 mg/dL — ABNORMAL HIGH (ref 2.3–4.6)
Sodium: 140 mEq/L (ref 135–145)

## 2010-08-30 LAB — URINE MICROSCOPIC-ADD ON

## 2010-08-30 LAB — C3 COMPLEMENT: C3 Complement: 158 mg/dL (ref 88–201)

## 2010-08-30 LAB — SODIUM, URINE, RANDOM: Sodium, Ur: 64 mEq/L

## 2010-08-30 LAB — C4 COMPLEMENT: Complement C4, Body Fluid: 37 mg/dL (ref 16–47)

## 2010-08-31 LAB — POCT HEMOGLOBIN-HEMACUE: Hemoglobin: 11.1 g/dL — ABNORMAL LOW (ref 13.0–17.0)

## 2010-09-01 NOTE — Letter (Signed)
Summary: [facility]  [facility]   Imported By: Sherian Rein 08/26/2010 10:26:30  _____________________________________________________________________  External Attachment:    Type:   Image     Comment:   External Document

## 2010-09-02 LAB — COMPREHENSIVE METABOLIC PANEL
ALT: 10 U/L (ref 0–53)
ALT: 11 U/L (ref 0–53)
ALT: 31 U/L (ref 0–53)
AST: 13 U/L (ref 0–37)
Alkaline Phosphatase: 29 U/L — ABNORMAL LOW (ref 39–117)
BUN: 53 mg/dL — ABNORMAL HIGH (ref 6–23)
BUN: 56 mg/dL — ABNORMAL HIGH (ref 6–23)
CO2: 20 mEq/L (ref 19–32)
CO2: 22 mEq/L (ref 19–32)
Calcium: 8.4 mg/dL (ref 8.4–10.5)
Calcium: 8.6 mg/dL (ref 8.4–10.5)
Chloride: 109 mEq/L (ref 96–112)
Chloride: 110 mEq/L (ref 96–112)
Creatinine, Ser: 6.8 mg/dL — ABNORMAL HIGH (ref 0.4–1.5)
Creatinine, Ser: 6.88 mg/dL — ABNORMAL HIGH (ref 0.4–1.5)
GFR calc non Af Amer: 8 mL/min — ABNORMAL LOW (ref >60)
GFR calc non Af Amer: 8 mL/min — ABNORMAL LOW (ref >60)
Glucose, Bld: 111 mg/dL — ABNORMAL HIGH (ref 70–99)
Glucose, Bld: 111 mg/dL — ABNORMAL HIGH (ref 70–99)
Glucose, Bld: 116 mg/dL — ABNORMAL HIGH (ref 70–99)
Potassium: 4.1 mEq/L (ref 3.5–5.1)
Sodium: 140 mEq/L (ref 135–145)
Sodium: 141 mEq/L (ref 135–145)
Total Bilirubin: 0.4 mg/dL (ref 0.3–1.2)
Total Bilirubin: 0.6 mg/dL (ref 0.3–1.2)
Total Protein: 6.1 g/dL (ref 6.0–8.3)

## 2010-09-02 LAB — RENAL FUNCTION PANEL
Albumin: 2.4 g/dL — ABNORMAL LOW (ref 3.5–5.2)
Albumin: 2.6 g/dL — ABNORMAL LOW (ref 3.5–5.2)
BUN: 49 mg/dL — ABNORMAL HIGH (ref 6–23)
BUN: 64 mg/dL — ABNORMAL HIGH (ref 6–23)
CO2: 22 mEq/L (ref 19–32)
Calcium: 8.8 mg/dL (ref 8.4–10.5)
Chloride: 106 mEq/L (ref 96–112)
Glucose, Bld: 113 mg/dL — ABNORMAL HIGH (ref 70–99)
Glucose, Bld: 123 mg/dL — ABNORMAL HIGH (ref 70–99)
Glucose, Bld: 170 mg/dL — ABNORMAL HIGH (ref 70–99)
Phosphorus: 5 mg/dL — ABNORMAL HIGH (ref 2.3–4.6)
Phosphorus: 5.9 mg/dL — ABNORMAL HIGH (ref 2.3–4.6)
Phosphorus: 6.6 mg/dL — ABNORMAL HIGH (ref 2.3–4.6)
Potassium: 3.6 mEq/L (ref 3.5–5.1)
Potassium: 3.6 mEq/L (ref 3.5–5.1)
Potassium: 3.7 mEq/L (ref 3.5–5.1)
Sodium: 138 mEq/L (ref 135–145)
Sodium: 141 mEq/L (ref 135–145)
Sodium: 142 mEq/L (ref 135–145)

## 2010-09-02 LAB — CBC
HCT: 24.3 % — ABNORMAL LOW (ref 39.0–52.0)
HCT: 27.4 % — ABNORMAL LOW (ref 39.0–52.0)
Hemoglobin: 7.8 g/dL — ABNORMAL LOW (ref 13.0–17.0)
Hemoglobin: 8.4 g/dL — ABNORMAL LOW (ref 13.0–17.0)
Hemoglobin: 8.4 g/dL — ABNORMAL LOW (ref 13.0–17.0)
Hemoglobin: 8.8 g/dL — ABNORMAL LOW (ref 13.0–17.0)
Hemoglobin: 9 g/dL — ABNORMAL LOW (ref 13.0–17.0)
Hemoglobin: 9.9 g/dL — ABNORMAL LOW (ref 13.0–17.0)
MCHC: 32.1 g/dL (ref 30.0–36.0)
MCHC: 32.3 g/dL (ref 30.0–36.0)
MCHC: 32.7 g/dL (ref 30.0–36.0)
MCV: 83.7 fL (ref 78.0–100.0)
MCV: 83.9 fL (ref 78.0–100.0)
MCV: 84.8 fL (ref 78.0–100.0)
RBC: 2.91 MIL/uL — ABNORMAL LOW (ref 4.22–5.81)
RBC: 3.08 MIL/uL — ABNORMAL LOW (ref 4.22–5.81)
RBC: 3.13 MIL/uL — ABNORMAL LOW (ref 4.22–5.81)
RBC: 3.19 MIL/uL — ABNORMAL LOW (ref 4.22–5.81)
RBC: 3.2 MIL/uL — ABNORMAL LOW (ref 4.22–5.81)
RBC: 3.26 MIL/uL — ABNORMAL LOW (ref 4.22–5.81)
RBC: 3.63 MIL/uL — ABNORMAL LOW (ref 4.22–5.81)
RDW: 14 % (ref 11.5–15.5)
RDW: 14.6 % (ref 11.5–15.5)
RDW: 14.6 % (ref 11.5–15.5)
WBC: 11.3 10*3/uL — ABNORMAL HIGH (ref 4.0–10.5)
WBC: 11.6 10*3/uL — ABNORMAL HIGH (ref 4.0–10.5)
WBC: 13 10*3/uL — ABNORMAL HIGH (ref 4.0–10.5)
WBC: 13.7 10*3/uL — ABNORMAL HIGH (ref 4.0–10.5)
WBC: 13.9 10*3/uL — ABNORMAL HIGH (ref 4.0–10.5)

## 2010-09-02 LAB — DIFFERENTIAL
Basophils Absolute: 0.1 10*3/uL (ref 0.0–0.1)
Basophils Absolute: 0.1 10*3/uL (ref 0.0–0.1)
Basophils Relative: 1 % (ref 0–1)
Basophils Relative: 1 % (ref 0–1)
Eosinophils Absolute: 0.1 10*3/uL (ref 0.0–0.7)
Eosinophils Absolute: 0.2 10*3/uL (ref 0.0–0.7)
Eosinophils Absolute: 0.3 10*3/uL (ref 0.0–0.7)
Eosinophils Relative: 2 % (ref 0–5)
Lymphocytes Relative: 19 % (ref 12–46)
Lymphocytes Relative: 27 % (ref 12–46)
Lymphs Abs: 2.4 10*3/uL (ref 0.7–4.0)
Lymphs Abs: 3.2 10*3/uL (ref 0.7–4.0)
Monocytes Absolute: 0.8 10*3/uL (ref 0.1–1.0)
Monocytes Absolute: 1 10*3/uL (ref 0.1–1.0)
Monocytes Relative: 7 % (ref 3–12)
Monocytes Relative: 8 % (ref 3–12)
Neutro Abs: 7.3 10*3/uL (ref 1.7–7.7)
Neutro Abs: 8.3 10*3/uL — ABNORMAL HIGH (ref 1.7–7.7)
Neutro Abs: 9.7 10*3/uL — ABNORMAL HIGH (ref 1.7–7.7)
Neutrophils Relative %: 62 % (ref 43–77)
Neutrophils Relative %: 72 % (ref 43–77)
Neutrophils Relative %: 73 % (ref 43–77)

## 2010-09-02 LAB — GLUCOSE, CAPILLARY
Glucose-Capillary: 102 mg/dL — ABNORMAL HIGH (ref 70–99)
Glucose-Capillary: 114 mg/dL — ABNORMAL HIGH (ref 70–99)
Glucose-Capillary: 120 mg/dL — ABNORMAL HIGH (ref 70–99)
Glucose-Capillary: 126 mg/dL — ABNORMAL HIGH (ref 70–99)
Glucose-Capillary: 135 mg/dL — ABNORMAL HIGH (ref 70–99)
Glucose-Capillary: 162 mg/dL — ABNORMAL HIGH (ref 70–99)
Glucose-Capillary: 223 mg/dL — ABNORMAL HIGH (ref 70–99)
Glucose-Capillary: 227 mg/dL — ABNORMAL HIGH (ref 70–99)
Glucose-Capillary: 230 mg/dL — ABNORMAL HIGH (ref 70–99)
Glucose-Capillary: 90 mg/dL (ref 70–99)
Glucose-Capillary: 98 mg/dL (ref 70–99)

## 2010-09-02 LAB — MAGNESIUM
Magnesium: 2.1 mg/dL (ref 1.5–2.5)
Magnesium: 2.3 mg/dL (ref 1.5–2.5)

## 2010-09-02 LAB — CULTURE, BLOOD (ROUTINE X 2)
Culture: NO GROWTH
Culture: NO GROWTH

## 2010-09-02 LAB — PROTIME-INR
INR: 1.26 (ref 0.00–1.49)
Prothrombin Time: 15.7 seconds — ABNORMAL HIGH (ref 11.6–15.2)

## 2010-09-02 LAB — LIPID PANEL
Cholesterol: 102 mg/dL (ref 0–200)
Triglycerides: 100 mg/dL (ref <150)

## 2010-09-02 LAB — BASIC METABOLIC PANEL
CO2: 25 mEq/L (ref 19–32)
Calcium: 8.5 mg/dL (ref 8.4–10.5)
Chloride: 104 mEq/L (ref 96–112)
Creatinine, Ser: 7.37 mg/dL — ABNORMAL HIGH (ref 0.4–1.5)
GFR calc Af Amer: 9 mL/min — ABNORMAL LOW (ref >60)
GFR calc Af Amer: 9 mL/min — ABNORMAL LOW (ref >60)
GFR calc non Af Amer: 7 mL/min — ABNORMAL LOW (ref >60)
Glucose, Bld: 144 mg/dL — ABNORMAL HIGH (ref 70–99)
Sodium: 135 mEq/L (ref 135–145)

## 2010-09-02 LAB — HEMOGLOBIN A1C
Hgb A1c MFr Bld: 8.3 % — ABNORMAL HIGH (ref 4.6–6.1)
Mean Plasma Glucose: 192 mg/dL

## 2010-09-02 LAB — IRON AND TIBC
Iron: 18 ug/dL — ABNORMAL LOW (ref 42–135)
TIBC: 272 ug/dL (ref 215–435)

## 2010-09-02 LAB — PTH, INTACT AND CALCIUM
Calcium, Total (PTH): 8.3 mg/dL — ABNORMAL LOW (ref 8.4–10.5)
Calcium, Total (PTH): 8.5 mg/dL (ref 8.4–10.5)
PTH: 138.8 pg/mL — ABNORMAL HIGH (ref 14.0–72.0)
PTH: 162.4 pg/mL — ABNORMAL HIGH (ref 14.0–72.0)

## 2010-09-02 LAB — PHOSPHORUS: Phosphorus: 7 mg/dL — ABNORMAL HIGH (ref 2.3–4.6)

## 2010-09-06 LAB — PROTIME-INR
INR: 1.12 (ref 0.00–1.49)
Prothrombin Time: 14.3 seconds (ref 11.6–15.2)

## 2010-09-06 LAB — APTT: aPTT: 33 seconds (ref 24–37)

## 2010-09-06 LAB — POCT I-STAT 4, (NA,K, GLUC, HGB,HCT): Glucose, Bld: 132 mg/dL — ABNORMAL HIGH (ref 70–99)

## 2010-09-07 ENCOUNTER — Encounter (HOSPITAL_COMMUNITY): Payer: BC Managed Care – PPO

## 2010-09-07 ENCOUNTER — Other Ambulatory Visit: Payer: Self-pay | Admitting: Nephrology

## 2010-09-07 LAB — POCT HEMOGLOBIN-HEMACUE: Hemoglobin: 11.6 g/dL — ABNORMAL LOW (ref 13.0–17.0)

## 2010-09-24 ENCOUNTER — Other Ambulatory Visit: Payer: Self-pay | Admitting: Internal Medicine

## 2010-09-24 ENCOUNTER — Ambulatory Visit (INDEPENDENT_AMBULATORY_CARE_PROVIDER_SITE_OTHER): Payer: BC Managed Care – PPO | Admitting: *Deleted

## 2010-09-24 ENCOUNTER — Other Ambulatory Visit: Payer: Self-pay | Admitting: Nephrology

## 2010-09-24 ENCOUNTER — Encounter (HOSPITAL_COMMUNITY): Payer: BC Managed Care – PPO | Attending: Nephrology

## 2010-09-24 DIAGNOSIS — D638 Anemia in other chronic diseases classified elsewhere: Secondary | ICD-10-CM | POA: Insufficient documentation

## 2010-09-24 DIAGNOSIS — Z7901 Long term (current) use of anticoagulants: Secondary | ICD-10-CM

## 2010-09-24 DIAGNOSIS — N184 Chronic kidney disease, stage 4 (severe): Secondary | ICD-10-CM | POA: Insufficient documentation

## 2010-09-24 DIAGNOSIS — I4891 Unspecified atrial fibrillation: Secondary | ICD-10-CM

## 2010-09-24 LAB — COMPREHENSIVE METABOLIC PANEL
ALT: 14 U/L (ref 0–53)
Alkaline Phosphatase: 63 U/L (ref 39–117)
CO2: 24 mEq/L (ref 19–32)
Calcium: 8.9 mg/dL (ref 8.4–10.5)
Chloride: 104 mEq/L (ref 96–112)
GFR calc non Af Amer: 26 mL/min — ABNORMAL LOW (ref >60)
Glucose, Bld: 402 mg/dL — ABNORMAL HIGH (ref 70–99)
Potassium: 5.2 mEq/L — ABNORMAL HIGH (ref 3.5–5.1)
Sodium: 136 mEq/L (ref 135–145)
Total Bilirubin: 0.2 mg/dL — ABNORMAL LOW (ref 0.3–1.2)

## 2010-09-24 LAB — GLUCOSE, CAPILLARY: Glucose-Capillary: 197 mg/dL — ABNORMAL HIGH (ref 70–99)

## 2010-09-24 LAB — CBC
Hemoglobin: 12.9 g/dL — ABNORMAL LOW (ref 13.0–17.0)
MCHC: 34.7 g/dL (ref 30.0–36.0)
RBC: 4.53 MIL/uL (ref 4.22–5.81)
WBC: 13 10*3/uL — ABNORMAL HIGH (ref 4.0–10.5)

## 2010-09-24 LAB — PROTIME-INR
Prothrombin Time: 28.5 seconds — ABNORMAL HIGH (ref 11.6–15.2)
Prothrombin Time: 34.1 seconds — ABNORMAL HIGH (ref 11.6–15.2)

## 2010-09-24 LAB — HEMOGLOBIN A1C
Hgb A1c MFr Bld: 8.9 % — ABNORMAL HIGH (ref 4.6–6.1)
Mean Plasma Glucose: 209 mg/dL

## 2010-09-25 ENCOUNTER — Encounter (HOSPITAL_COMMUNITY): Payer: BC Managed Care – PPO

## 2010-10-05 ENCOUNTER — Encounter: Payer: BC Managed Care – PPO | Admitting: *Deleted

## 2010-10-08 ENCOUNTER — Encounter: Payer: Self-pay | Admitting: Internal Medicine

## 2010-10-12 ENCOUNTER — Other Ambulatory Visit: Payer: Self-pay | Admitting: Endocrinology

## 2010-10-16 ENCOUNTER — Encounter: Payer: Self-pay | Admitting: Gastroenterology

## 2010-10-16 ENCOUNTER — Telehealth: Payer: Self-pay | Admitting: Gastroenterology

## 2010-10-16 ENCOUNTER — Ambulatory Visit (AMBULATORY_SURGERY_CENTER): Payer: BC Managed Care – PPO | Admitting: *Deleted

## 2010-10-16 VITALS — Ht 73.0 in | Wt 235.1 lb

## 2010-10-16 DIAGNOSIS — Z1211 Encounter for screening for malignant neoplasm of colon: Secondary | ICD-10-CM

## 2010-10-16 NOTE — Telephone Encounter (Signed)
Updated patient's chart with new info. [name]

## 2010-10-16 NOTE — Progress Notes (Signed)
Patient has moviprep already from last office visit with Dr.Patterson 07-21-10.

## 2010-10-20 ENCOUNTER — Encounter: Payer: Self-pay | Admitting: Internal Medicine

## 2010-10-27 ENCOUNTER — Telehealth: Payer: Self-pay | Admitting: Gastroenterology

## 2010-10-27 NOTE — Telephone Encounter (Signed)
Pt. Called because he had dialysis today and dialysis nurse told him to only drink half of movi prep for tomorrows colonoscopy and his instructions tell him to complete full prep . Pt . Is calling to find out what he should do.

## 2010-10-27 NOTE — Op Note (Signed)
NAME:  [name], [name]                 ACCOUNT NO.:  [id number]   MEDICAL RECORD NO.:  [id number]          PATIENT TYPE:  AMB   LOCATION:  ENDO                         FACILITY:  [facility]   PHYSICIAN:  Georgiana Spinner, M.D.    DATE OF BIRTH:  09/01/1950   DATE OF PROCEDURE:  02/07/2007  DATE OF DISCHARGE:                               OPERATIVE REPORT   PROCEDURE:  Upper endoscopy with biopsy.   INDICATIONS:  GERD.   ANESTHESIA:  Fentanyl 100 mcg, Versed 7 mg, Benadryl 25 mg.   PROCEDURE:  With the patient mildly sedated in the left lateral  decubitus position, the Pentax videoscopic endoscope was inserted in the  mouth, passed under direct vision through the esophagus which appeared  normal.  There was no evidence of Barrett's esophagus.  We entered into  the stomach.  Fundus, body, antrum appeared normal.  Duodenal bulb,  however, showed a fairly large polyp on a stalk.  This was photographed  and biopsied only.  Second portion duodenum appeared normal.  From this  point the endoscope was slowly withdrawn taking circumferential views of  duodenal mucosa until the endoscope had been pulled back into the  stomach, placed in retroflexion to view the stomach from below.  The  endoscope was straightened and withdrawn, taking circumferential views  of the remaining gastric and esophageal mucosa.  The patient's vital  signs and pulse oximeter remained stable.  The patient tolerated the  procedure well without apparent complications.   FINDINGS:  Polyp of duodenum, biopsied.  Await biopsy report.  The  patient to resume Coumadin therapy.  The patient will call me for  results of biopsy and follow-up with me as an outpatient.           ______________________________  Georgiana Spinner, M.D.     GMO/MEDQ  D:  02/07/2007  T:  02/07/2007  Job:  [id number]

## 2010-10-27 NOTE — Assessment & Plan Note (Signed)
[facility]                         ELECTROPHYSIOLOGY OFFICE NOTE   NAME:[name], [name]                        MRN:          [id number]  DATE:03/02/2007                            DOB:          1950/10/13    No known drug allergies.   This gentleman recently found to have atrial flutter with a recurrence.  It looks atypical and will need anatomical mapping.  He has been set up  to follow up at the hospital on October 3.  His INRs have been  therapeutic for several weeks now.   MEDICATIONS:  1. Toprol XL 50 mg twice daily.  2. Tricor 145 mg daily.  3. Jenuvia 100 mg daily.  4. Lanoxin 0.25 mg 1-1/2 tablet daily.  5. Glipizide 10 mg daily.  6. Metformin 500 mg 4 times daily.  7. Lipitor 80 mg at bedtime.  8. Prevacid 30 mg daily.  9. Lantus and Humalog as directed.  10.Lisinopril 20/25 daily.  11.Altace 10 mg daily.  12.Rowasa 1 gm twice daily.   I will call him to make sure that he holds certain medications prior to  the procedure.      Maple Mirza, PA  Electronically Signed      Rollene Rotunda, MD, [facility]  Electronically Signed   GM/MedQ  DD: 03/02/2007  DT: 03/02/2007  Job #: [id number]

## 2010-10-27 NOTE — Assessment & Plan Note (Signed)
OFFICE VISIT   [name], [name]  DOB:  10/25/1950                                       08/26/2009  ZOXWR#:[id number]   The patient is a patient with chronic renal insufficiency not yet on  hemodialysis who underwent left basilic vein transposition by me on  March 2.  Today he is seen in followup with excellent healing of his  incisions and an excellent pulse and palpable thrill in the fistula from  the antecubital area up to the axilla.  He does have 1+ edema in the arm  and a 2+ radial pulse palpable.  No evidence of steal on physical exam.   I asked him to elevate the arm intermittently during the day and at  night and that the fistula could be utilized in June of this year if  necessary.  He will return to see [place] on a p.r.n. basis.     Quita Skye Hart [place], M.D.  Electronically Signed   JDL/MEDQ  D:  08/26/2009  T:  08/27/2009  Job:  [id number]   cc:   Dyke Maes, M.D.

## 2010-10-27 NOTE — Cardiovascular Report (Signed)
NAME:  [name], [name]                 ACCOUNT NO.:  [id number]   MEDICAL RECORD NO.:  [id number]          PATIENT TYPE:  OIB   LOCATION:  1965                         FACILITY:  [facility]   PHYSICIAN:  Bruce R. Juanda Chance, MD, FACCDATE OF BIRTH:  10/27/1950   DATE OF PROCEDURE:  06/23/2007  DATE OF DISCHARGE:                            CARDIAC CATHETERIZATION   HISTORY:  [name] is 60 years old and recently had a flutter ablation  by Dr. Graciela Husbands.  He also has diabetes, hypertension, and hyperlipidemia.  Dr. Graciela Husbands saw him back after his flutter ablation, and his  electrocardiogram showed rather striking inferior and lateral ST-T  changes.  These were present previously, but they are more striking on  his recent ECG.  He had previous echocardiogram which showed normal LV  function.  Dr. Graciela Husbands made arrangements for him to come to the hospital  for evaluation and angiography.   PROCEDURE:  Left heart catheterization was performed percutaneously via  the right femoral artery using arterial sheath and 4-French preformed  coronary catheters.  A front wall arterial puncture was performed and  Omnipaque contrast was used.  The patient tolerated the procedure and  left the laboratory in satisfactory condition.   RESULTS:  The aortic pressure was 125/68 with mean of 92, and left  ventricular pressure was 125/9.   There was no left main since there were separate ostia of the LAD and  circumflex arteries.   The left anterior descending artery gave rise to three diagonal branches  and two septal perforators.  There was 30 and 20% stenoses proximally  and irregularities in the proximal and midvessel.   The circumflex artery was a dominant vessel and gave rise to a small  ramus branch, two marginal branches, a large posterolateral branch, and  a posterior descending branch.  These vessels were free of significant  disease.   The right coronary artery was a small nondominant vessel that supplied  two  right ventricular branches.  There was 70% narrowing in the proximal  to midportion of the vessel.   The left ventriculogram performed in the RAO projection showed good wall  motion with no areas of hypokinesis.  Estimated ejection fraction was  60%.   CONCLUSION:  Mild nonobstructive coronary artery disease with 30 and 20%  stenoses in the proximal left anterior descending, no significant  obstruction in the circumflex artery, and 70% narrowing in the small  nondominant right coronary artery with normal left ventricular function.   RECOMMENDATIONS:  The patient has only mild nonobstructive disease.  The  reason for his abnormal ECG is not clear.  His LV function is normal.  He has not been feeling well, and this may be related to his diabetic  control.  He has not been very compliant with this.  We will plan to get  him back to Dr. Juleen China and see if we can reinforce the importance of  optimal management of his diabetes and lipids.      Bruce Elvera Lennox Juanda Chance, MD, [facility]  Electronically Signed     BRB/MEDQ  D:  06/23/2007  T:  06/23/2007  Job:  [id number]   cc:   Duke Salvia, MD, [place], M.D.  Cardiopulmonary Lab

## 2010-10-27 NOTE — Cardiovascular Report (Signed)
NAME:  [name], [name]                 ACCOUNT NO.:  [id number]   MEDICAL RECORD NO.:  [id number]          PATIENT TYPE:  INP   LOCATION:  3707                         FACILITY:  [facility]   PHYSICIAN:  Bevelyn Buckles. Bensimhon, MDDATE OF BIRTH:  [birth year]   DATE OF PROCEDURE:  02/01/2007  DATE OF DISCHARGE:                            CARDIAC CATHETERIZATION   PROCEDURES:  Transesophageal echocardiogram cardioversion.   REFERRING PHYSICIAN:  Duke Salvia, MD, [facility].   INDICATION:  Symptomatic atrial flutter, difficult to rate control.   DESCRIPTION OF PROCEDURE:  Mr. Rockers presented to the endoscopy suite on  systemic heparin.  He was sedated.  Underwent TEE which showed normal LV  function, no evidence of left atrial thrombus.  Once appropriate  sedation was achieved, he received a single 200 joules biphasic shock  with prompt reversion to normal sinus rhythm.  There are no apparent  complications.      Bevelyn Buckles. Bensimhon, MD  Electronically Signed     DRB/MEDQ  D:  02/01/2007  T:  02/01/2007  Job:  [id number]   cc:   Duke Salvia, MD, [facility]

## 2010-10-27 NOTE — Assessment & Plan Note (Signed)
[facility]                         ELECTROPHYSIOLOGY OFFICE NOTE   NAME:[name], Perry CARREKER                        MRN:          [id number]  DATE:06/19/2007                            DOB:          01/05/1951    [name] comes in today, having undergone catheter ablation about a  month ago.  He had about a week of palpitations thereafter, what we had  seen the morning after his procedure was that he was having PVCs and  short runs of atrial tachycardia.  That has been quiescent for the last  three weeks.   He has no complaints of chest pain or shortness of breath.   PHYSICAL EXAMINATION:  On examination today, his blood pressure is  elevated at 153/73.  His pulse was 100.  LUNGS:  Clear.  HEART SOUNDS:  Regular.  EXTREMITIES:  Without edema.   Electrocardiogram, however, was strikingly abnormal in that he has had  progressive problems with ST segment depression in the inferior leads as  well as across the anterior precordium, and this has been a progressive  thing over the last years and over the last couple of weeks as well,  even compared to his electrocardiogram in early December.   Given his multiple cardiac risk factors, we discussed diagnostic  options, and I would favor proceeding with catheterization.  I have  discussed potential benefits as well as potential risks to him,  including but not limited to death, perforation, and vascular injury.  We will plan to discontinue his Coumadin.  He is scheduled for Friday of  this week with [name].   I look forward to hearing from [name] about the results of the study  at the time it is done.     Perry Salvia, MD, [facility]  Electronically Signed    SCK/MedQ  DD: 06/19/2007  DT: 06/19/2007  Job #: [id number]

## 2010-10-27 NOTE — Discharge Summary (Signed)
NAME:  [name], [name]                 ACCOUNT NO.:  [id number]   MEDICAL RECORD NO.:  [id number]          PATIENT TYPE:  OIB   LOCATION:  3741                         FACILITY:  [facility]   PHYSICIAN:  Duke Salvia, MD, FACCDATE OF BIRTH:  1950-07-29   DATE OF ADMISSION:  05/18/2007  DATE OF DISCHARGE:  05/19/2007                               DISCHARGE SUMMARY   PROCEDURES:  1. Cava tricuspid isthmus ablation, electrophysiology study.   PRIMARY FINAL DISCHARGE DIAGNOSIS:  Atrial flutter.   SECONDARY DIAGNOSES:  1. Chronic anticoagulation with Coumadin  2. Diabetes.  3. Hypertension.  4. Dyslipidemia  5. Preserved left ventricular function with an EF of 60% and no left      atrial appendage thrombus by TEE February 01, 2007.  6. History of diabetic foot ulcers.  7. Ongoing tobacco use.  8. Hypertriglyceridemia.  9. Status post EGD with biopsy.  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   TIME OF DISCHARGE:  Thirty five minutes.   HOSPITAL COURSE:  [name] is a 60 year old male with no previous  history of coronary artery disease, but he has a history of tachy  arrhythmias.  He was seen in the office on May 18, 2007, and it was  felt that he needed ablation of atrial flutter and was admitted to the  hospital.   On May 19, 2007, he had an EP study with cava tricuspid isthmus  ablation.  He tolerated the procedure well.  Post procedure he was  having tachy palpitations and felt his heart skipping.  On telemetry, he  was having bursts of tachycardia and PVCs.  A 12-lead EKG is pending for  further evaluation.  This will be reviewed by MD.  If his tachycardia is  nonmalignant, he will be discharged home with outpatient follow-up  arranged.   LABORATORY VALUES:  INR at discharge 2.5.   DISCHARGE INSTRUCTIONS:  1. His activity level is to be increased gradually.  2. He is to stick to a low-sodium diabetic diet.  3.  He is to follow up with Dr. Graciela Husbands on January 5 at 2:15.  4. He is to follow up the Coumadin clinic on December 11 at noon.  5. He is encouraged not to use tobacco.   DISCHARGE MEDICATIONS:  1. Coumadin 5 mg 2 days a week.  Otherwise 7.5 mg daily.  2. Toprol XL 50 mg b.i.d.  3. Tricor 145 mg daily.  4. Januvia 100 mg daily.  5. Lanoxin 0.25 mg 1-1/2 tablets daily.  6. Glipizide 10 mg daily.  7. Metformin 500 mg four times a day.  8. Lipitor 80 mg a day.  9. Prevacid 30 mg a day.  10.Lisinopril HCT 20/12.5 mg daily.  11.Altace 10 mg a day is on hold.  12.Lovaza 1 gram daily.  13.Lantus 72 units q.a.m.  14.NovoLog 50/50 70 units q.p.m.  15.Actos dose unknown as prior to admission.      Theodore Demark, PA-C      Duke Salvia, MD, [facility]  Electronically Signed  RB/MEDQ  D:  05/19/2007  T:  05/19/2007  Job:  [id number]   cc:   Deirdre Peer. Polite, M.D.

## 2010-10-27 NOTE — Op Note (Signed)
NAME:  [name], [name]                 ACCOUNT NO.:  [id number]   MEDICAL RECORD NO.:  [id number]          PATIENT TYPE:  OIB   LOCATION:  3021                         FACILITY:  [facility]   PHYSICIAN:  Nadara Mustard, MD     DATE OF BIRTH:  March 02, 1951   DATE OF PROCEDURE:  08/22/2008  DATE OF DISCHARGE:                               OPERATIVE REPORT   PREOPERATIVE DIAGNOSIS:  Charcot collapse, right foot with rocker-bottom  deformity.   POSTOPERATIVE DIAGNOSIS:  Charcot collapse, right foot with rocker-  bottom deformity.   PROCEDURE:  Takedown of the Charcot collapse, reconstruction of the  medial column, and open reduction and internal fixation of the Charcot  collapse with intramedullary screw fixation.   SURGEON:  Nadara Mustard, MD   ANESTHESIA:  General.   ESTIMATED BLOOD LOSS:  Minimal.   ANTIBIOTICS:  2 g of Kefzol.   DRAINS:  None.   COMPLICATIONS:  None.   TOURNIQUET TIME:  None.   DISPOSITION:  To PACU in stable condition.   INDICATIONS FOR PROCEDURE:  The patient is a 60 year old gentleman with  diabetic insensate neuropathy and Charcot collapse.  He has pending  ulceration due to the rocker-bottom deformity and presents at this time  for open reduction and internal fixation and restoration of the arch of  his foot.  Risks and benefits were discussed including infection,  neurovascular injury, persistent pain, collapse, need for additional  surgery.  The patient states he understands and wished to proceed at  this time.   PROCEDURE:  The patient was brought to OR #1 and underwent a general  anesthetic.  After adequate level of anesthesia was obtained, the  patient's right lower extremity was prepped using DuraPrep and draped  into a sterile field.  A medial longitudinal incision was made over the  medial cuneiform navicular and base of the first metatarsal.  This was  carried sharply down to bone and the joints were debrided and cartilage  was removed.  The  rocker-bottom deformity was reconstructed and his arch  was reestablished.  Using retrograde technique, a guide pin was then  inserted from the base of first metatarsal exiting the first metatarsal  head, a skin incision was made.  The guidewire was then placed from the  base of the first metatarsal through the medial cuneiform navicular and  into the talar head.  C-arm fluoroscopy verified of reduction both AP  and lateral planes.  A 130-mm 7.3 small threaded cannulated screw was  then inserted over the wire and the medial column was reduced with the  internal fixation.  AP and lateral radiographs showed restoration of the  arch with the restoration of the medial column.  The wounds were  irrigated  with normal saline.  The guide pin was removed.  The subcu was closed  using 2-0 Vicryl.  The skin was closed using 2-0 nylon.  The wounds were  covered with Adaptic orthopedic sponges, ABD dressing, Kerlix, and  Coban.  The patient was extubated and taken to PACU in stable condition.      [name]  [name], MD  Electronically Signed     MVD/MEDQ  D:  08/22/2008  T:  08/23/2008  Job:  [id number]

## 2010-10-27 NOTE — H&P (Signed)
[name], [name]                 ACCOUNT NO.:  [id number]   MEDICAL RECORD NO.:  [id number]          PATIENT TYPE:  INP   LOCATION:  3707                         FACILITY:  [facility]   PHYSICIAN:  Duke Salvia, MD, FACCDATE OF BIRTH:  15-Feb-1951   DATE OF ADMISSION:  01/31/2007  DATE OF DISCHARGE:  02/02/2007                              HISTORY & PHYSICAL   This patient has no known drug allergies.  Time for dictation greater  than 40 minutes with exam.   FINAL DIAGNOSES:  1. Symptomatic tachycardia/palpitation.  2. Recurrence of atypical atrial flutter with rapid ventricular rate.   SECONDARY DIAGNOSES:  1. Electrophysiology study November 2007 with radiofrequency catheter      ablation/slow P-wave modification (supraventricular tachycardia      could not be induced during this procedure).  2. No further tachyarrhythmias since November 2007 until this      presentation.  3. Diabetes, diagnosed 18 years ago.  4. Hypertension.   PROCEDURE:  1. February 01, 2007, transesophageal echocardiogram demonstrating      ejection fraction 60% with no left atrial appendage clot no PFO.  2. February 01, 2007, DCCV atrial fibrillation to sinus rhythm.  The      patient maintaining sinus rhythm with intermittent paroxysms of      nonsustained atrial fibrillation post DCCV.   BRIEF HISTORY:  [name] is a 60 year old male.  He is a history of  supraventricular tachycardia.   He has apparently experienced tachypalpitations throughout most of his  adult life.  Over the years he has learned how to terminate his  tachyarrhythmia with either carotid massage or vagal maneuvers.  He was  seen by Dr. Graciela Husbands November 2007.  He underwent EP study.  Supraventricular tachycardia was not inducible.  The patient did receive  radiofrequency catheter ablation with slow P-wave modification.  The  patient has had no dysrhythmias since November 2007 until 2 weeks ago.   The patient experienced flurries,  perhaps five to six episodes lasting  only a few minutes.  They are quite symptomatic for this patient.  They  are like the feeling he has had with supraventricular tachycardia.  Four  days ago on Saturday, August 16 the patient awoke to palpitation.  He  knew that his heart was racing.  He had slight dizziness.  He has  profound fatigue.  He does not get short of breath.  He does not have  chest pain or chest discomfort.   The rapid rate did not respond to his usual maneuvers.  Over the weekend  he continued rapid rate.  He thought that somehow they would self-  terminate.  However, they have lasted for the last 4 days.  He presented  to [facility] office on the morning of August 19.  The patient  received adenosine 6 mg which failed to terminate tachycardia, though  atrial flutter waves were observed.  The patient then received 2.5 mg of  IV verapamil.  The patient also did not convert to sinus rhythm.   The patient says that when he does convert on his  own he has a pause  which lasts about three of the regular beats, then a hard beat, then  another pause lasting three beats, then a couple of hard beats, then he  goes into a regular rhythm.  At this time when any maneuver such as  vagal maneuver or massage were tried, he would skip one beat but then go  right back into his regular rapid rhythm.  The patient is admitted  directly from the office for TEE and if no left atrial appendage  thrombus, DC cardioversion.   HOSPITAL COURSE:  The patient transferred from the office of [facility] on August 19 directly to the telemetry floor.  The diagnosis  is atypical atrial flutter recurrence and history of previous  radiofrequency catheter ablation November 2007.  The patient was in a  rapid rate, very regular at 160 beats per minute on admission to the  floor.  He was started on IV digoxin and IV Cardizem if his IV digoxin  did not slow his heart rate.  With a combination of  these two rate  controls, the patient had rate reduction into the 110 beat-per-minute  range, but it was atrial fibrillation.  The patient was also started on  IV heparin and oral Cardizem as soon as he was admitted on August 19.  As mentioned above, on August 20 he had transesophageal echocardiogram  which showed no left atrial appendage thrombus and then subsequent  cardioversion to sinus rhythm.  The patient has maintained sinus rhythm  for the last 36 hours.  He has had intermittent nonsustained atrial  fibrillation as shown by telemetry and as measured by the patient's  experience, with some tachypalpitations overnight.  The patient will  discharge August 21 with his regular medications with some additions for  heart rate control should he lapse into atrial fibrillation or atrial  flutter.  He has been started on Coumadin.  He will receive his third  dose of Coumadin today.  He goes home on subcutaneous Lovenox  injections.  He follows up at the Coumadin clinic on Monday, August 25  at 11 a.m. for his first interview with them, and he will see the  physician assistant to set him up for radiofrequency catheter ablation  with ESI anatomical mapping Thursday, September 18, at 3:30 p.m.  An  electrocardiogram will also be taken at that time.   MEDICATIONS AT DISCHARGE:  1. Toprol-XL 50 mg daily.  2. Digoxin 0.25 mg tablets, one-and-one-half tablets daily.  3. Actos 45 mg daily.  4. TriCor 145 mg daily.  5. Januvia 100 mg daily.  6. Glipizide 10 mg daily.  7. Omacor 1 g daily.  8. Altace 10 mg daily.  9. Lipitor 80 mg daily at bedtime.  10.Prevacid 30 mg daily.  11.Lantus 72 units in the morning.  12.Humalog 50/50, 70 units before dinner.  13.Lisinopril/hydrochlorothiazide 20/12.5 daily.  14.Coumadin 5-mg tablets, one-and-one-half tablets in the morning on      August 21 and August 22, then 5 mg daily beginning August 23,      Saturday.  15.As regards metformin 500 mg daily -  this has been on hold since his      creatinine has been elevated greater than 1.5 per pharmacy      protocol.  This situation has been discussed with the patient.  It      is recommended that he hold metformin but this is optional for him.      Once again,  his elevated creatinine will be described to him with      the pharmacy recommendation.   LABORATORY STUDIES THIS ADMISSION:  His serum electrolytes:  Sodium 136,  potassium 4.8, chloride 103, carbonate 23, BUN is 26, creatinine 1.76,  glucose 286.  Once again, the creatinine is greater than 1.5 and  metformin  has been held.  His pro time this admission on the day of discharge is  14.9, INR is 1.1.  Admission complete blood count August 19 is white  cells 11.7, hemoglobin 12.2, hematocrit 35, platelets are 405.  TSH this  admission was 0.634.      Maple Mirza, [place]      Duke Salvia, MD, [facility]  Electronically Signed    GM/MEDQ  D:  02/02/2007  T:  02/03/2007  Job:  [id number]   cc:   Jaclyn Prime. Lucas Mallow, M.D.

## 2010-10-27 NOTE — H&P (Signed)
[name], [name]                 ACCOUNT NO.:  [id number]   MEDICAL RECORD NO.:  [id number]          PATIENT TYPE:  INP   LOCATION:  3707                         FACILITY:  [facility]   PHYSICIAN:  Duke Salvia, MD, FACCDATE OF BIRTH:  10/22/1950   DATE OF ADMISSION:  01/31/2007  DATE OF DISCHARGE:                              HISTORY & PHYSICAL   His attending is Dr. Sherryl Manges, he also has Dr. Aggie Cosier.   No known drug allergies.   CHIEF COMPLAINT:  My heart has started up all over again.   HISTORY OF PRESENT ILLNESS:  [name] is a 60 year old male who has a  history of supraventricular tachycardia.  He has apparently had this for  quite a long time throughout most of his adult life. Over the years, he  has learned how to terminate this with either carotid massage or other  vagal maneuvers. He was seen by Dr. Graciela Husbands November 2007. He underwent  electrophysiology study. Interestingly the supraventricular tachycardia  was not inducible. However the patient did receive radiofrequency  catheter ablation with slow P wave modification. He did not have any  junctional rhythm ensuing. The patient went home and has had no further  dysrhythmias from the November date.   However 2 weeks ago the patient noted flurries perhaps 5 or 6 episodes  that lasted only a few minutes and then terminated. They are exactly  like his feeling of palpitation when he has his supraventricular  tachycardia. Four days ago Saturday, August 16, the patient awoke to  palpitation.  He knew that he was going fast and taking his heart rate  confirmed this. He had slight dizziness. With these spells he has  profound fatigue.  He does not get short of breath and he does not get  chest pain.  The rapid rate did not respond to his usual maneuvers.  Since this was the weekend he did not seek medical attention and thought  that these would be self terminating; however, they have lasted for the  last 4 days. He  presented to the office with Dr. Graciela Husbands this morning,  Tuesday, August 19. In the office the patient received adenosine 6 mg  and this failed to terminate his tachycardia although atrial flutter  waves were observed.  The patient then received 2.5 mg of verapamil IV;  however, the patient also did not convert to a sinus rhythm.  The  patient says that when he successfully converts he has a pause which  lasts about three of his regular beats and then a hard beat and then  another pause lasting about three beats and then a couple of hard beats  and then he goes into a regular rhythm. This time when any of these  maneuvers were tried, both his or these chemical maneuvers, it would  skip one beat but then go right back into his rapid rhythm.   He is admitted directly from the office for transesophageal  echocardiogram and, DCCV to be done August 20.   SECONDARY DIAGNOSES:  1. Hypertension.  2. Diabetes for 18 years.  3. Significant obesity.  4. Diabetic neuropathy.  5. Gastroparesis.  6. Erectile dysfunction. He also had resection of pilonidal cyst.   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   REVIEW OF SYSTEMS:  The patient is dizzy and has fatigue, no shortness  of breath, no chest pain, no orthopnea, no paroxysmal nocturnal dyspnea,  no syncope or presyncope.  He has no urogenital complications or past  history. No anxiety or depression, no arthralgias or joint effusions.  Of note several weeks ago, the patient did have significant peripheral  edema.  This cleared after he was placed on  lisinopril/hydrochlorothiazide medications. Will obtain a serum  potassium, perhaps his hydrochlorothiazide has been rubbing him of  potassium and therefore inciting this rhythm.   MEDICATIONS:  1. Actos 45 mg daily.  This will held on the morning of August 20.  2. Tricon 145 mg daily.  3. Januvia 100 mg daily, hold on the morning of August 20.  4. Glipizide  10 mg daily, hold on the morning of August 20.  5. Metformin XR 500 mg 4 tablets daily, hold on the morning of August      20.  6. Lipitor 80 mg at bedtime.  7. Prevacid 300 mg daily.  8. Lisinopril 20/hydrochlorothiazide 12.5 mg daily.  9. Lantus 72 units subcu in the morning, hold on the morning of August      20.  10.Humalog 50/50 70 units before dinner daily.   PLAN:  Admit the patient to telemetry.  He will have a transesophageal  echocardiogram and cardioversion August 20 by Dr. Gala Romney with a  cardioversion in the EP lab. Laboratory studies will be obtained.  CBC,  BMET, PT/INR, TSH. Pharmacy will start IV heparin and to dose Coumadin.  He will start on IV digoxin and IV Cardizem if the digoxin fails to slow  the heart rate after the second dose. We are possibly anticipating  hypokalemia secondary to his hydrochlorothiazide which may have incited  this rhythm. We will correct potassium as required.      Maple Mirza, [place]      Duke Salvia, MD, [facility]  Electronically Signed    GM/MEDQ  D:  01/31/2007  T:  02/01/2007  Job:  [id number]

## 2010-10-27 NOTE — Op Note (Signed)
NAME:  [name], [name]                 ACCOUNT NO.:  [id number]   MEDICAL RECORD NO.:  [id number]          PATIENT TYPE:  OIB   LOCATION:  3741                         FACILITY:  [facility]   PHYSICIAN:  Duke Salvia, MD, FACCDATE OF BIRTH:  23-Jun-1950   DATE OF PROCEDURE:  05/19/2007  DATE OF DISCHARGE:                               OPERATIVE REPORT   PREOPERATIVE DIAGNOSIS:  Atrial flutter.   POSTOPERATIVE DIAGNOSIS:  Atrial flutter.   PROCEDURE:  Cava tricuspid isthmus ablation, electrophysiological study.   Following obtaining of informed consent, the patient was brought to the  electrophysiology laboratory and placed on the fluoroscopic table in the  supine position.  After routine prep and drape, cardiac catheterization  was performed with local anesthesia and conscious sedation.  Noninvasive  blood pressure monitoring, transcutaneous oxygen saturation monitoring  and tidal CO2 monitoring were performed continuously throughout the  procedure.  Following the procedure, the catheters were removed,  hemostasis was obtained and the patient was transferred to the holding  area in stable condition.   The catheter was a 5 [place] quadripolar catheter that was inserted in  the left femoral vein at the AV junction.  A 6 French octapolar catheter  was inserted via the right femoral vein in the coronary sinus.  A 7  French duodecapolar catheter was inserted via the left femoral vein to  the tricuspid annulus.  An 8 French 8-mm flexible tip catheter was  inserted via the right femoral vein to mapping sites in the posterior  septal space.   Source of leads 1 AVS and V1 were monitored continuously.  Following  insertion of the catheters, the stimulation protocol included burst  atrial pacing, intraventricular pacing, single atrial extra stimuli at a  pace cycling of 600 milliseconds.   RESULTS:  ELECTROCARDIOGRAM:  INITIAL:  Rhythm:  Sinus.  RM Interval:  675 milliseconds.  PR Interval:  192  milliseconds.  QRS Duration:  84  milliseconds.  QT interval:  358 milliseconds.  P Wave Duration:  121  milliseconds. Bundle branch block:  Absent.  Preexcitation:  absent.   FINAL:  Rhythm:  Sinus.  R Interval:  686 milliseconds.  PR Interval:  181 milliseconds.  P Wave Duration:  131 milliseconds.  QRS Duration:  100 milliseconds.  QT Interval:  353 milliseconds; intracardiac  intervals were not measured.   AV NODAL FUNCTION:  AV Wenckebach was not assessed.  Retrograde  Wenckebach was at 360 milliseconds.  AV nodal sector refractor.  The  pace cycle at 600 milliseconds was 340 milliseconds and conduction was  continuous.   ACCESSORY PATHWAY FUNCTION:  No evidence of accessory pathway was  identified.   Arrhythmia was induced.  Attempts were made to induce atrial flutter by  coronary sinus pacing.  Sustained atrial flutter could not be induced  and not withstanding the atypical nature of the patient's previously  documented atrial flutter given the fact that the patient had  significant improvement following slow pathway modification, I elected  to burn across the cavotricuspid isthmus somewhat empirically hoping  that his flutter was indeed isthmus dependent.  This was accomplished  with relatively little difficulty.   A total of 8 minutes 5 seconds of RF energy was applied across the  cavotricuspid isthmus, although, isthmus block was accomplished on the  first burn.  Voltages across the isthmus were very low with general and  indeed P wave amplitude is very low in this gentleman.  The  cavotricuspid isthmus was quite large, but the area between the valve  and the coronary sinus ostium was quite short.   The patient tolerated the procedure without apparent complication.   IMPRESSION:  1. Normal sinus function.  2. Abnormal atrial function manifested by sustained atrial flutter      that was inducible.  3. Normal atrioventricular nodal function.  4. Normal His Purkinje  system function.  5. No accessory pathway.  6. Normal ventricular response to programmed stimulation.   SUMMARY/CONCLUSION:  Results of electrophysiological testing failed to  identify persistence of his flow pathway previously demonstrated.  In  addition, cavotricuspid isthmus conduction block was generated by  catheter ablation across this area.  The patient tolerated the procedure  as noted without apparent complication.      Duke Salvia, MD, [facility]  Electronically Signed     SCK/MEDQ  D:  05/18/2007  T:  05/19/2007  Job:  [id number]

## 2010-10-28 ENCOUNTER — Ambulatory Visit (AMBULATORY_SURGERY_CENTER): Payer: BC Managed Care – PPO | Admitting: Gastroenterology

## 2010-10-28 ENCOUNTER — Encounter: Payer: Self-pay | Admitting: Gastroenterology

## 2010-10-28 VITALS — BP 169/86 | HR 84 | Temp 97.3°F | Resp 15 | Ht 73.0 in | Wt 225.0 lb

## 2010-10-28 DIAGNOSIS — K648 Other hemorrhoids: Secondary | ICD-10-CM

## 2010-10-28 DIAGNOSIS — K5730 Diverticulosis of large intestine without perforation or abscess without bleeding: Secondary | ICD-10-CM

## 2010-10-28 DIAGNOSIS — Z1211 Encounter for screening for malignant neoplasm of colon: Secondary | ICD-10-CM

## 2010-10-28 DIAGNOSIS — D126 Benign neoplasm of colon, unspecified: Secondary | ICD-10-CM

## 2010-10-28 DIAGNOSIS — Z01818 Encounter for other preprocedural examination: Secondary | ICD-10-CM

## 2010-10-28 DIAGNOSIS — I441 Atrioventricular block, second degree: Secondary | ICD-10-CM | POA: Insufficient documentation

## 2010-10-28 HISTORY — DX: Diverticulosis of large intestine without perforation or abscess without bleeding: K57.30

## 2010-10-28 HISTORY — DX: Other hemorrhoids: K64.8

## 2010-10-28 HISTORY — DX: Benign neoplasm of colon, unspecified: D12.6

## 2010-10-28 MED ORDER — SODIUM CHLORIDE 0.9 % IV SOLN: 500.0000 mL | INTRAVENOUS | Status: DC

## 2010-10-28 NOTE — Patient Instructions (Signed)
Discharged instructions given with verbal understanding. Handouts on polyps, diverticulosis and hemorrhoids given. Resume previous medications. 

## 2010-10-29 ENCOUNTER — Telehealth: Payer: Self-pay | Admitting: *Deleted

## 2010-10-29 NOTE — Progress Notes (Signed)
This encounter was created in error - please disregard.

## 2010-10-29 NOTE — Telephone Encounter (Signed)

## 2010-10-30 NOTE — Letter (Signed)
June 01, 2006    Brooke Bonito, M.D.  [address]  [place], [place] [address]   RE:  [name], [name]  MRN:  [id number]  /  DOB:  18-Mar-1951   Dear Maurine Minister,   [name] is seen following his ablation for SVT.  Interestingly, we  were unable to induce tachycardia but he did have evidence of AV nodal  reentry and we did a slow pathway modification.  He has had no recurrent  symptoms.  We interpret this as a successful procedure.   Of note, he is hypertensive today and, on examination as noted below, he  is quite hypertensive.   I have taken the liberty in anticipation of him seeing you in 10 days of  doubling his lisinopril/hydrochlorothiazide that you had put him on from  20/12.5 to 40/25.   He also wanted to know whether he could stop his Toprol.  I say that as  it relates to his rhythm that would not be an issue but, as it relates  to his hypertension and withdrawal, that he would need to negotiate that  with you.  I did tell him he could stop his Lanoxin.   If there is anything further we can do to help in his care, please do  not hesitate to contact us.  As noted, his examination today  demonstrated an elevated blood pressure at 166/83 and his pulse was 69.  His weight was down 5 pounds from 247 to 242.  Lungs were clear.  Heart  sounds were regular and the edema was as noted.   Maurine Minister, I hope this letter finds you well.  If I can be of any further  assistance, please do not hesitate to contact me.    Sincerely,      Perry Salvia, MD, [facility]  Electronically Signed    SCK/MedQ  DD: 06/01/2006  DT: 06/01/2006  Job #: [id number]   CC:    Jaclyn Prime. Lucas Mallow, M.D.

## 2010-10-30 NOTE — Discharge Summary (Signed)
NAME:  [name], [name]                 ACCOUNT NO.:  [id number]   MEDICAL RECORD NO.:  [id number]          PATIENT TYPE:  OIB   LOCATION:  6526                         FACILITY:  [facility]   PHYSICIAN:  Maple Mirza, PA   DATE OF BIRTH:  1951-04-13   DATE OF ADMISSION:  04/27/2006  DATE OF DISCHARGE:  04/28/2006                                 DISCHARGE SUMMARY   ALLERGIES:  NO KNOWN DRUG ALLERGIES.   PRINCIPAL DIAGNOSIS:  Discharging day #1 status post electrophysiology  study.  Overall, there is no inducible supraventricular tachycardia.  There  is evidence of dual pathways, and a retrograde P-wave is present on the  proximal ST segment on the electrocardiogram.  The patient did have  radiofrequency catheter ablation of the SP region without any junctional  rhythm ensuing.  The patient did discharge day #1.  He is maintaining sinus  rhythm.  His angiotensin-converting enzyme inhibitor has been increased from  5 mg Altace daily to 10 mg Altace daily.   SECONDARY DIAGNOSES:  1. Hypertension.  2. Diabetes.  3. Significant obesity.  4. Diabetic neuropathy.  5. Gastroparesis.  6. Erectile dysfunction.   PAST MEDICAL HISTORY:  1. Resection of pilonidal cyst.  2. New discontinuation of cigarette use.  The patient is on Chantix.   PROCEDURE:  On April 27, 2006, electrophysiology study.  No inducible  SVT.  Evidence of dual pathways.  Radiofrequency emissions were applied to  ST region, and the case was terminated Dr. Sherryl Manges.   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  He has a 50-year  history of abrupt onset/offset of tachy palpitations.  Lately they are more  frequent and can occur 10-20 times a day.  Vagal maneuvers stop almost all  of these, including Valsalva and carotid massage.  He feels that at  termination he becomes presyncopal and one time almost syncopal.  This  episode occurred in a car.  He does not have chest pain or shortness of  breath  with these spells.  He denies exertional dyspnea.  He does have  peripheral edema.  No nocturnal dyspnea or orthopnea.  The patient does use  marijuana frequently.  [name] probably has a concealed accessory pathway  which has become increasingly symptomatic.  Treatment options, including  antiarrhythmic drugs as well as electrophysiology study.  After hearing the  risks and benefits, the patient opted for electrophysiology study,  radiofrequency catheter ablation.  This will be scheduled the first week in  November.  The patient will have a stress test prior to that.   HOSPITAL COURSE:  The patient presented electively April 27, 2006.  He  underwent electrophysiology study.  Overall SVT was noninducible.  Radiofrequency catheter ablation was applied in the SP region where one  could expect reentry pathway to occur, and the case was terminated.  Patient  discharging postprocedure day #1 on his home medications.   DISCHARGE MEDICATIONS:  1. A new dose of Altace has been prescribed at 2 mg daily.  2. Toprol-XL 50 mg twice daily.  3. Lanoxin 0.25  mg tablets, 1/2 tablet daily.  4. Lipitor 80 mg daily at bedtime.  5. Actos 45 mg daily.  6. Tricor 145 mg daily.  7. Januvia 100 mg daily.  8. Glipizide 10 mg daily.  9. Prevacid 30 mg daily.  10.Chantix as directed daily.  11.He is asked to hold metformin until Saturday, April 30, 2006 and      then to restart at 500 mg, 4 tablets daily.  12.The patient also takes Lantus, NovoLog 35 units at dinnertime, and NPH      35 units daily.   LABORATORY DATA:  Laboratory studies pertinent to this admission were taken  April 20, 2006 and revealed white cells of 14.6, hemoglobin 13.7,  hematocrit 41.5, platelets of 424.  Protime is 10.8, INR 0.8.  Sodium was  139, potassium 4.6, chloride 104, carbonate 26, glucose 259, BUN 17,  creatinine 1.5.      Maple Mirza, PA     GM/MEDQ  D:  04/28/2006  T:  04/28/2006  Job:  [id number]    cc:   Jaclyn Prime. Lucas Mallow, M.D.  Duke Salvia, MD, [facility], M.D.

## 2010-10-30 NOTE — Letter (Signed)
March 28, 2006     Jaclyn Prime. Lucas Mallow, M.D.  [address]  [place], [place] [address]   RE:  STEEN, BISIG  MRN:  [id number]  /  DOB:  07-Feb-1951   Dear Onalee Hua,   It was a privilege to see your patient, Norval Slaven today in consultation  regarding his PAT.   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  It had been partially  quiescent until more recently where they have become notably more frequent  occurring now 10 to 20 times per day. He is able to stop almost all of these  with vagal maneuvers including Valsalva and carotid massage.  They have  become increasing symptomatic he thinks primarily with termination becoming  presyncopal and at one time near syncopal.  This episode occurred in a car.  He does not have chest pain or shortness of breath with these spells.  He  denies exertional dyspnea.  He does have peripheral edema.  He does not have  nocturnal dyspnea or orthopnea.   He has hypertension.  His salt intake is quite liberal.   His past history is notable for  1. Diabetes.  2. The recent discontinuation of his cigarette use.  3. Significant obesity with a 50 pound weight loss and a recent 20 to 30      pound weight regain.  4. Neuropathy related to his diabetes without knowing whether he has      nephropathy or retinopathy.  5. Gastroparesis.  6. Sexual dysfunction.   His past surgical history is notable for pilonidal cyst.   SOCIAL HISTORY:  He is married.  He has three children.  He does not use  cigarettes since April. He is taking Chantix for this.  He does not use  alcohol.  He does use marijuana frequently.   He uses some caffeine.   CURRENT MEDICATIONS:  1. Toprol 50 mg twice daily recently initiated.  2. Actos 45.  3. Tricor 145.  4. Jenuvia 100.  5. Lanoxin 0.375.  6. Glipizide 10.  7. Ambicor 1 four times daily.  8. Metformin 2000 a day.  9. Altace  5.  10.Lipitor 80.  11.Prevacid 30.  12.Lantus.  13.Humalog.   ALLERGIES:  NO KNOWN DRUG ALLERGIES   PHYSICAL EXAMINATION:  GENERAL:  He is a middle to older caucasian male  appearing his stated age of 22.  VITAL SIGNS:  His weight was 247, his blood pressure was 146/78.  His pulse  was 63.  HEENT:  Exam demonstrated no xanthelasma.  NECK:  Carotids were brisk.  His JVP was 7.  BACK:  Without kyphosis, scoliosis.  LUNGS:  Clear.  HEART:  Sounds were regular without murmurs or gallops.  ABDOMEN:  Soft with active bowel sounds without midline pulsation or  hepatomegaly.  EXTREMITIES:  Femoral pulses were 2+.  Distal pulses were intact.  There was  no clubbing or cyanosis.  There was 2+ edema.  NEUROLOGIC:  Exam was grossly normal.  SKIN:  Warm and dry.   Electrocardiogram obtained today demonstrated sinus rhythm at 62 with  intervals of 0.27./0.10/0.37.  The axis was 56 degrees.  There was an R-  prime in lead V1.   Thank you very much for having sent the Holter monitor.  It was quite  revealing.  It shows recurrent abrupt onset offset episodes of tachycardia  without PR prolongation.  This was consistent  with his history supportive of  an accessory pathway which is concealed electrocardiographically.  He also  has significant bradycardia with heart rates in the 30s and 40s occurring  during the day.  It was not clear about the temporal relationship of this to  tachycardia.  He is having some post termination pauses after tachycardia.   Echocardiogram demonstrating normal left ventricular function.   IMPRESSION:  1. 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  2. First degree AV block.  3. Diabetes.  4. Cardiac risk factors notable for      a.     Diabetes.      b.     Hypertension.      c.     Obesity.   Onalee Hua, Mr. Michon almost certainly has concealed accessory pathway which has  become increasingly symptomatic  with now presyncopal spells occurring either  with the tachycardia or with his efforts to terminate.  He has been dealing  with this for 50 years and he would like to have it eliminated if possible.  We discussed treatment options including antiarrhythmic drugs as well as  electrophysiologic study and radio frequency catheter ablation with  potential for pro-arrhythmia with the former and procedure complications  with the latter including but not limited to death, heart attack and MI in  the range of 1:1000 or so, perforation at 1:250 to 350 and heart block  requiring pacemaker implantation about 1 in 100.  He would like to proceed  with catheter ablation.  We have tentatively scheduled it after a high  school reunion golf match in the first week in November.   In anticipation of this with his multiple cardiac risk factors, I wanted to  see if you help [place] arrange a nuclear stress test.   In addition with his edema, I was wondering whether a diuretic might be of  some benefit.  I have given him a prescription for Lisinopril/HCTZ to take  with him when he sees Dr. Juleen China on Thursday of this week to see if that does  not make some sense.  I have also encouraged him to decrease his salt intake  which he describes as copious.   Onalee Hua, thanks very much for the consultation.   Sincerely,     ______________________________  Duke Salvia, MD, [facility]   SCK/MedQ  /  Job #:  [id number]  DD:  03/28/2006 / DT:  03/28/2006   CC:    Brooke Bonito, M.D.

## 2010-10-30 NOTE — Consult Note (Signed)
NAME:  [name], [name]                           ACCOUNT NO.:  [id number]   MEDICAL RECORD NO.:  [id number]                   PATIENT TYPE:  REC   LOCATION:  FOOT                                 FACILITY:  [facility]   PHYSICIAN:  Jonelle Sports. Sevier, M.D.              DATE OF BIRTH:  [birth year]   DATE OF CONSULTATION:  DATE OF DISCHARGE:                                   CONSULTATION   REFERRING PHYSICIAN:  Dr. Duffy Rhody C. Wilson.   HISTORY:  This 60 year old white male is seen at the courtesy of Dr. Andrey Campanile  for assistance with management of an interdigital ulcer on the right 5th  toe.   The patient has a history of type 2 diabetes of some 15 years' duration  which apparently is in reasonably good control, as he reports hemoglobin A1c  has been in the 7% range or less recently and his periodic capillary blood  glucose determinations generally run between 120 and 180.   He is, in addition, a smoker of 1-1/2 packs of cigarettes per day for 30  years and says he has made numerous efforts to discontinue this without  success.   With that background history and with an awareness of insensitivity to his  feet, but with no prior callus or ulcer difficulty of which he is aware, the  patient first noticed 3 or 4 weeks ago the presence of a small ulcer on the  medial aspect of the right 5th toe.  There had been some callus there and he  had trimmed that away previously, but never had there previously been  ulceration and drainage.  He saw his primary physician, Dr. Karma Ganja, the  next day and was given an antibiotic, the name of which is uncertain.  At  that time, it was noted that he also had a fissure on the plantar aspect at  the base of the 4th toe on that side which subsequently healed.  He has used  Neosporin and gauze, and has noted that the ulcer has been progressively  smaller, but remains unhealed.  He is here now for our further evaluation  and advice.   PAST MEDICAL HISTORY:  Past  medical history is notable primarily for his  tobacco abuse and for history of extreme hypertriglyceridemia.  He is said  not to have hypertension (although pressure here today is 135/92) and he  does have a history of paroxysmal atrial tachycardia.   ALLERGIES:  He has no known medication allergies.   MEDICATIONS:  His regular medications include Glucotrol XL, Lanoxin,  Prevacid, Glucophage XR, Altace (which he says is for renal protection),  Lipitor, Actos and Lantus at bedtime.   EXAMINATION:  EXTREMITIES:  Examination today is limited to the distal lower  extremities.  The feet are free of edema, no gross deformity, although there  is considerable widening of the forefoot and an early tendency toward hallux  valgus bilaterally.  In addition, he has some early clawing of the toes  which is slightly more prominent on the right than on the left.  Skin  temperatures are normal and symmetrical.  There is no edema.  Pulses are  everywhere palpable and quite adequate.   Noteworthy is the fact that he lacks protective sensation throughout the  entire plantar aspects of both feet.   There is minimal callus formation at the 1st MP areas medially on both feet,  but this does not require attention.   There is still some slight reddening and peeling of the 5th toe on the  right, indicative of prior infection there.  In the 4th-5th interdigital  space on that foot is noted a small open ulceration over the distal phalanx  of the 5th toe on its medial aspect and there is a kissing callus on the  medial aspect of the 4th toe in that same interspace.   IMPRESSION:  1.  Diabetes mellitus, type 2, with marginal control and accompanied with      neuropathy with loss of protective sensation.  2.  Neuropathic interdigital ulcer, right fifth toe.  3.  Hypertriglyceridemia, currently on treatment with Lipitor.  4.  Questionable blood pressure with observation here today being outside      the limits  of normal acceptable for diabetes.  5.  Tobacco abuse, chronic and severe.   DISPOSITION:  1.  The patient is given instruction regarding foot care and diabetes by      video with nurse and physician reinforcement.  2.  It is explained to the patient that he needs to make every effort again      to give up his smoking, in that doubtless this together with his      diabetes and insensate status will put him at extremely risk in the      future for ongoing foot problems.  3.  The patient's footwear is assessed and is found to be slightly      inadequate in length, which is probably forcing the wide portion of his      forefoot into a narrow aspect of the shoe, thus then creating the      tendency towards increased interdigital pressure which likely is at the      basis of this ulceration.  4.  With his early clawing of the toes and with his having placed an extra      padded insert in the shoe, there is evidence of early callus formation      on the dorsal aspect of the right 4th toe at the PIP joint and      accordingly, in all likelihood, the shoes are of inadequate depth as      well.  5.  The callus on the medial aspect of the right 4th toe in the interdigital      space is sharply pared without incident.  6.  The small ulcer on the medial aspect of the distal phalanx of the 5th      toe on the right is partial-thickness debrided and explored, and it is      found that following such debridement, the size of the ulcer is      approximately 2 x 2 mm with some 1.5 to 2.0 mm in depth, but of concern      is a tiny pinpoint apparent fissure extending beyond the base of this      ulcer.  This  cannot be explored with instruments available here, but      obviously must raise the concern that the potential for joint space or      bone infection exists.  7.  The patient is advised to keep his 4th and 5th toes of the right foot     widely separated through the use of lamb's wool padding and to  continue      daily treatment of the lesion after careful washing with Neosporin.  8.  He is advised to consult [facility] for the      fashioning of diabetic inserts, appropriate since his feet are now fully      insensate.  By the same prescription, I am asking them please to      evaluate his shoes for adequacy of forefoot width and depth, and to      advise him accordingly.  9.  The patient is to be set for an x-ray of that foot to see if there is      any evidence by that modality of infection of either bone or joint.  10. He is to be seen again here for repeat visit in 13 days, sooner if      indicated by virtue of the x-ray.                                               Jonelle Sports. Cheryll Cockayne, M.D.    RES/MEDQ  D:  02/27/2004  T:  02/27/2004  Job:  [id number]   cc:   Vale Haven. Andrey Campanile, M.D.  [address]  [place]  [place] [address]  Fax: [phone]

## 2010-10-30 NOTE — Op Note (Signed)
NAME:  [name], [name]                 ACCOUNT NO.:  [id number]   MEDICAL RECORD NO.:  [id number]          PATIENT TYPE:  OIB   LOCATION:  6526                         FACILITY:  [facility]   PHYSICIAN:  Duke Salvia, MD, FACCDATE OF BIRTH:  Oct 25, 1950   DATE OF PROCEDURE:  04/27/2006  DATE OF DISCHARGE:  04/28/2006                                 OPERATIVE REPORT   PREOPERATIVE DIAGNOSIS:  Short RP tachycardia.   POSTOPERATIVE DIAGNOSIS:  No inducible supraventricular tachycardia with  evidence of dual atrioventricular nodal physiology.   PROCEDURE:  Invasive electrophysiology study and radiofrequency catheter  ablation applied to the slow pathway; isoproterenol infusion.   DESCRIPTION OF PROCEDURE:  After obtaining informed consent, the patient was  brought to the electrophysiology laboratory and placed on the fluoroscopic  table in the supine position.  After routine prep and drape, cardiac  catheterization was performed with local anesthesia and conscious sedation.  Noninvasive blood pressure monitoring and transcutaneous oxygen saturation  monitoring were performed continuously throughout the procedure.  Following  the procedure, the catheters were removed.  Hemostasis was obtained, and the  patient was transferred to the floor in stable condition.   Catheter was a 5-French quadripolar catheter.  It was inserted via the left  femoral vein to the A/V junction to measure His electrogram.   A 6-French Octapolar catheter was inserted in the right femoral vein to the  coronary sinus.   A 5-French quadripolar catheter was inserted via the left femoral vein to  the right ventricular apex.   A 4-mm deflectable tip ablation catheter was inserted into the right femoral  vein to the mapping sites in the posterior septal space.   Service leads I, aVF, and V1 were monitored continuously throughout the  procedure.  Following insertion of the catheters, the stimulation protocol  included  incremental atrial pacing, incremental ventricular pacing, single-  atrial extrastimuli at paced cycling for 500, 550, 400 and 450 msec.   Single and double ventricular extrastimuli.   RESULTS:  Surface electrocardiogram:  Initial:  Rhythm:  Sinus:  RR rhythm, 848 msec; P-R interval: 246 msec; QRS  restoration: 107 msec; Q-T interval 384 msec; P wave duration 68 msec;  bundle branch block:  absent; preexcitation:  absent.   A-H interval:  172 msec; H-V interval 35 msec.   Final:  Rhythm:  Sinus:  RR interval 698 msec; P-R interval:  244 msec; QRS  restoration: 107 msec; Q-T interval: 372 msec; P wave duration 96 msec.   A-H interval 152 msec; H-V interval:  49 msec.   A/V nodal function:  A/V Wenckebach was at 380 msec.   V/A Wenckebach was at 500 msec.  Unfortunately, I do not have recorded in  front of me the A/V nodal effective refractory periods, but A/V nodal  discontinuity with isolated echo beats was reproducibly seen with premature  atrial extra stimulation.  Despite isoproterenol, more than a single echo  beat was not seen.   Retrograde conduction was continuous.   Accessory pathway function:  No evidence of any accessory pathway was  identified with Wenckebach.  This was retrograde in the presence and the  absence of isoproterenol.   Arrhythmias induced:  None.  An isolated echo beat was all that could be  seen.  Recalling that the electrocardiogram was a short RP tachycardia,  although the retrograde P wave was in the proximal portion of the ST  segment, the only mechanism of tachycardia identified during this procedure  was dual A/V nodal physiology.  Because of that slow pathway, the slow  pathway was targeted anatomically.  A radio wave energy was applied from P2  up to M1.  Unfortunately, no junctional rhythm was seen and, although  junctional beats became less frequently induced, they persisted.   Because of encroachment upon the A/V node and the discordance  between the  identified tachycardia by electrocardiogram and the mechanism which we were  pursuing, i.e., A/V nodal re-entry, further RF energy was not applied more  anterior than M1.  It was hypothesized that the patient had potentially a  slow intermediate tachycardia mechanism.   IMPRESSION:  1. Normal sinus function.  2. Normal atrial function.  3. Dual antegrade atrioventricular nodal physiology with isolated echo      beats.  4. Normal His-Purkinje system function.  5. No accessory pathway.  6. Normal ventricular response to program stimulation.   SUMMARY/CONCLUSION:  The results of the electrophysiological testing failed  to induce the patient's clinical tachycardia.  Mechanistic assessment  identified dual antegrade A/V nodal physiology; although this would not  likely have been the mechanism via a typical A/V nodal reentry, a slow  intermediate mechanism might well explain the patient's electrocardiogram  and the patient's electrophysiological substrate as identified at EP  testing.  Because of  that, slow pathway targeting was undertaken; however, because of the lack of  a junctional response from P2 up to M1, it was elected not to apply further  energy and to see how the patient did.   The patient tolerated the procedure without apparent complication.      Duke Salvia, MD, [facility]  Electronically Signed     SCK/MEDQ  D:  05/06/2006  T:  05/06/2006  Job:  [id number]   cc:   Jaclyn Prime. Lucas Mallow, M.D.

## 2010-11-03 ENCOUNTER — Encounter: Payer: Self-pay | Admitting: Gastroenterology

## 2010-11-04 ENCOUNTER — Other Ambulatory Visit: Payer: Self-pay | Admitting: *Deleted

## 2010-11-04 MED ORDER — OMEPRAZOLE 20 MG PO CPDR: DELAYED_RELEASE_CAPSULE | ORAL | Status: DC

## 2010-11-12 ENCOUNTER — Ambulatory Visit: Payer: BC Managed Care – PPO | Admitting: Endocrinology

## 2010-11-13 ENCOUNTER — Ambulatory Visit (HOSPITAL_COMMUNITY): Payer: BC Managed Care – PPO

## 2010-11-13 ENCOUNTER — Observation Stay (HOSPITAL_COMMUNITY)
Admission: RE | Admit: 2010-11-13 | Discharge: 2010-11-14 | Disposition: A | Discharge status: Home / Self Care | Payer: BC Managed Care – PPO | Source: Ambulatory Visit | Attending: Orthopedic Surgery | Admitting: Orthopedic Surgery

## 2010-11-13 DIAGNOSIS — Z794 Long term (current) use of insulin: Secondary | ICD-10-CM | POA: Insufficient documentation

## 2010-11-13 DIAGNOSIS — N2581 Secondary hyperparathyroidism of renal origin: Secondary | ICD-10-CM | POA: Insufficient documentation

## 2010-11-13 DIAGNOSIS — E1169 Type 2 diabetes mellitus with other specified complication: Principal | ICD-10-CM | POA: Insufficient documentation

## 2010-11-13 DIAGNOSIS — I120 Hypertensive chronic kidney disease with stage 5 chronic kidney disease or end stage renal disease: Secondary | ICD-10-CM | POA: Insufficient documentation

## 2010-11-13 DIAGNOSIS — Z01812 Encounter for preprocedural laboratory examination: Secondary | ICD-10-CM | POA: Insufficient documentation

## 2010-11-13 DIAGNOSIS — M869 Osteomyelitis, unspecified: Secondary | ICD-10-CM | POA: Insufficient documentation

## 2010-11-13 DIAGNOSIS — F17200 Nicotine dependence, unspecified, uncomplicated: Secondary | ICD-10-CM | POA: Insufficient documentation

## 2010-11-13 DIAGNOSIS — Z992 Dependence on renal dialysis: Secondary | ICD-10-CM | POA: Insufficient documentation

## 2010-11-13 DIAGNOSIS — L02619 Cutaneous abscess of unspecified foot: Secondary | ICD-10-CM | POA: Insufficient documentation

## 2010-11-13 DIAGNOSIS — M9080 Osteopathy in diseases classified elsewhere, unspecified site: Secondary | ICD-10-CM | POA: Insufficient documentation

## 2010-11-13 DIAGNOSIS — D649 Anemia, unspecified: Secondary | ICD-10-CM | POA: Insufficient documentation

## 2010-11-13 DIAGNOSIS — Z01818 Encounter for other preprocedural examination: Secondary | ICD-10-CM | POA: Insufficient documentation

## 2010-11-13 DIAGNOSIS — N186 End stage renal disease: Secondary | ICD-10-CM | POA: Insufficient documentation

## 2010-11-13 LAB — GLUCOSE, CAPILLARY
Glucose-Capillary: 363 mg/dL — ABNORMAL HIGH (ref 70–99)
Glucose-Capillary: 309 mg/dL — ABNORMAL HIGH (ref 70–99)
Glucose-Capillary: 276 mg/dL — ABNORMAL HIGH (ref 70–99)
Glucose-Capillary: 224 mg/dL — ABNORMAL HIGH (ref 70–99)
Glucose-Capillary: 361 mg/dL — ABNORMAL HIGH (ref 70–99)

## 2010-11-13 LAB — COMPREHENSIVE METABOLIC PANEL
ALT: 15 U/L (ref 0–53)
AST: 19 U/L (ref 0–37)
Albumin: 3.5 g/dL (ref 3.5–5.2)
Alkaline Phosphatase: 94 U/L (ref 39–117)
GFR calc Af Amer: 17 mL/min — ABNORMAL LOW (ref >60)
Potassium: 4.1 mEq/L (ref 3.5–5.1)
Sodium: 139 mEq/L (ref 135–145)
Total Protein: 7.2 g/dL (ref 6.0–8.3)

## 2010-11-13 LAB — CBC
Hemoglobin: 14.3 g/dL (ref 13.0–17.0)
MCH: 28.8 pg (ref 26.0–34.0)
MCHC: 32.4 g/dL (ref 30.0–36.0)
Platelets: 398 10*3/uL (ref 150–400)
RBC: 4.97 MIL/uL (ref 4.22–5.81)

## 2010-11-13 LAB — APTT: aPTT: 31 seconds (ref 24–37)

## 2010-11-14 ENCOUNTER — Inpatient Hospital Stay (HOSPITAL_COMMUNITY): Payer: BC Managed Care – PPO

## 2010-11-14 LAB — RENAL FUNCTION PANEL
Albumin: 2.9 g/dL — ABNORMAL LOW (ref 3.5–5.2)
BUN: 33 mg/dL — ABNORMAL HIGH (ref 6–23)
Chloride: 100 mEq/L (ref 96–112)
Creatinine, Ser: 4.62 mg/dL — ABNORMAL HIGH (ref 0.4–1.5)

## 2010-11-14 LAB — CBC
MCH: 27.8 pg (ref 26.0–34.0)
MCHC: 31.5 g/dL (ref 30.0–36.0)
MCV: 88.1 fL (ref 78.0–100.0)
Platelets: 370 10*3/uL (ref 150–400)
RBC: 4.36 MIL/uL (ref 4.22–5.81)

## 2010-11-25 NOTE — Op Note (Signed)
NAME:  [name], [name]                 ACCOUNT NO.:  [id number]  MEDICAL RECORD NO.:  [id number]           PATIENT TYPE:  I  LOCATION:  6708                         FACILITY:  [facility]  PHYSICIAN:  Nadara Mustard, MD     DATE OF BIRTH:  1950-11-09  DATE OF PROCEDURE:  11/13/2010 DATE OF DISCHARGE:                              OPERATIVE REPORT   PREOPERATIVE DIAGNOSIS:  Osteomyelitis, Wagner grade 3 ulcer abscess plantar aspect, right foot.  POSTOPERATIVE DIAGNOSIS:  Osteomyelitis, Wagner grade 3 ulcer abscess plantar aspect, right foot.  PROCEDURES: 1. Ostectomy, right midfoot. 2. Irrigation and debridement of skin and soft tissue and bone. 3. Placement of antibiotic beads with 1 g vancomycin and 240 mg of     gentamicin. 4. Application of ACell graft and powder. 5. Complicated wound closure 8 cm in length.  SURGEON:  Nadara Mustard, MD  ANESTHESIA:  General.  ESTIMATED BLOOD LOSS:  Minimal.  ANTIBIOTICS:  2 g of Kefzol.  DRAINS:  None.  COMPLICATIONS:  None.  DISPOSITION:  To PACU in stable condition.  INDICATIONS FOR PROCEDURE:  The patient is a 60 year old gentleman with Charcot collapse of the right foot, type 2 diabetes, end-stage renal disease currently on dialysis who presents with abscess, ulceration, infection, plantar aspect of right foot due to failure of conservative treatment and presents at this time for surgical intervention.  Risks and benefits were discussed including infection, neurovascular injury, persistent pain, nonhealing wound, potential for amputation.  The patient states he understands and wished to proceed at this time.  DESCRIPTION OF PROCEDURE:  The patient was brought to OR room 1 and underwent general anesthetic.  After adequate level of anesthesia obtained, the patient's right lower extremity was prepped using DuraPrep and draped into a sterile field.  An elliptical incision was made around the plantar ulcer this made a wound that was 8  cm in length and 4 cm in width.  This was carried down to the soft tissue and the tissue was resected with one block of tissue.  The bone and the midfoot was then resected as well.  The previous internal fixation hardware was not visible within the wound and this was not addressed due to it not being involved with the wound or the infected area.  The wound was irrigated with normal saline.  Antibiotic beads absorbable.  The stimulant beads were placed deep in the wound and thus used 1 g of vancomycin and 240 mg of gentamicin liquid.  The deep fascial layer was then closed with 2-0 Vicryl.  Skin was closed over the antibiotic beads superficial to the plantar fascial layer.  The stimulant powder was placed.  The wound was then closed using 2-0 nylon with a far-near and near-far suture technique.  The seven layer ACell graft was then sutured through the plantar wound.  This then covered Adaptic orthopedic sponges, ABD dressing, Kerlix, and Coban.  The patient was extubated, taken to PACU in stable condition.  Plan for dialysis in the morning, touchdown weightbearing on the right.  Followup in the office in 1 week.     [name]  [name], MD     MVD/MEDQ  D:  11/13/2010  T:  11/14/2010  Job:  [id number]  Electronically Signed by Aldean Baker MD on 11/25/2010 06:52:42 AM

## 2010-12-10 ENCOUNTER — Other Ambulatory Visit: Payer: Self-pay | Admitting: Endocrinology

## 2010-12-10 ENCOUNTER — Other Ambulatory Visit: Payer: Self-pay | Admitting: Internal Medicine

## 2010-12-10 LAB — PROTIME-INR: INR: 1.2 — AB (ref <=1.1)

## 2010-12-10 NOTE — Telephone Encounter (Signed)
Called spoke with pt last Coumadin check here was in 04/12.  Pt states he is now on dialysis, goes to [facility].  [facility] they state pt d/c from hospital 6/4 INR 0.99 pt taking 2.5mg  daily, he is a [facility] pt.  INR drawn today with monthly labs, have order to draw once monthly. Advised we have not received any INR results from HD. Nurse states she will have results on Sat and will fax over.  Will refill rx and await results. Placed pt's name in lab book for follow-up on 09/14/10.

## 2010-12-14 ENCOUNTER — Ambulatory Visit (INDEPENDENT_AMBULATORY_CARE_PROVIDER_SITE_OTHER): Payer: Self-pay | Admitting: Cardiology

## 2010-12-14 DIAGNOSIS — R0989 Other specified symptoms and signs involving the circulatory and respiratory systems: Secondary | ICD-10-CM

## 2010-12-28 ENCOUNTER — Encounter: Payer: Self-pay | Admitting: Endocrinology

## 2010-12-28 ENCOUNTER — Ambulatory Visit (INDEPENDENT_AMBULATORY_CARE_PROVIDER_SITE_OTHER): Payer: BC Managed Care – PPO | Admitting: Endocrinology

## 2010-12-28 DIAGNOSIS — E119 Type 2 diabetes mellitus without complications: Secondary | ICD-10-CM

## 2010-12-28 NOTE — Patient Instructions (Addendum)
Change humalog to 25 units 3x a day (just before each meal, including bedtime snack) It is expected that your blood sugar would stay high for about 10 more days, form the prednisone.  During that time, take an extra 5 units for any blood sugar in the 200's, and 10 any time it is over 300. check your blood sugar 2 times a day.  vary the time of day when you check, between before the 3 meals, and at bedtime.  also check if you have symptoms of your blood sugar being too high or too low.  please keep a record of the readings and bring it to your next appointment here.  please call us sooner if you are having low blood sugar episodes. Please make a follow-up appointment in 2-3 weeks.

## 2010-12-28 NOTE — Progress Notes (Signed)
Subjective:    Patient ID: [name], male    DOB: [birth year], 60 y.o.   MRN: [id number]  HPI Pt says he was started on prednisone "pack," 1 week ago, for 1 week of severe low-back pain.  He has assoc cbg's up to 400.  He takes humalog qac 30-30-10 units (he does not eat breakfast, and instead eats a late-night snack).  Prior to dialysis, cbg's varies from 80 (afternoon) to 200's (am).   He started dialysis 2 mos ago, and he feels better since then.   Past Medical History  Diagnosis Date  . Chronic kidney disease     dialysis t,th,sat  . GERD (gastroesophageal reflux disease)   . Hypertension   . Neuromuscular disorder     neurophathy  . Atrial fibrillation     goes in and out  . Hyperlipidemia   . Atrial flutter 07/07/2009  . DIABETES MELLITUS, TYPE II 08/01/2008  . GOUT 08/01/2008  . ERECTILE DYSFUNCTION 08/01/2008  . PERIPHERAL NEUROPATHY 08/01/2008  . CORONARY ARTERY DISEASE 08/01/2008    non obstructive per cath 1/10; Dr. Juanda Chance  . SVT/ PSVT/ PAT 07/30/2009  . Gastroparesis 08/01/2008  . RENAL INSUFFICIENCY 08/01/2008  . NEPHROLITHIASIS, HX OF 08/01/2008  . Chronic systolic heart failure 06/21/2010  . COPD 07/21/2010  . RENAL FAILURE-CHRONIC 07/21/2010  . Long term current use of anticoagulant 08/26/2010  . Diverticulosis of colon (without mention of hemorrhage) 10/28/2010  . Benign neoplasm of colon 10/28/2010  . Hemorrhoids, internal 10/28/2010  . Renal cyst     bilateral    Past Surgical History  Procedure Date  . Atrial ablation surgery 07-2008, 07/08/09    Dr Johney Frame  . Right foot 09-2009    infection  . Colonoscopy 9yrs ago    Dr.Orr  . Av fistula placement     left upper arm  . S/p right arm surgery after fracture   . Pilonidal cyst excision     s/p    History   Social History  . Marital Status: Married    Spouse Name: N/A    Number of Children: 3  . Years of Education: N/A   Occupational History  . Sales Rep    Social History Main Topics  . Smoking status:  Current Everyday Smoker -- 0.5 packs/day for 40 years    Types: Cigarettes  . Smokeless tobacco: Not on file  . Alcohol Use: No  . Drug Use: No  . Sexually Active:    Other Topics Concern  . 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 allopurinol (ZYLOPRIM) 100 MG tablet Take 1 tablet by mouth daily.       Marland Kitchen atorvastatin (LIPITOR) 40 MG tablet Take 40 mg by mouth daily.       . B Complex-C-Folic Acid (DIALYVITE PO) Take 100 mg by mouth daily.        Marland Kitchen COUMADIN 5 MG tablet Take as directed by Anticoagulation clinic  30 tablet  1  . FOSRENOL 1000 MG chewable tablet Chew 1 tablet by mouth 3 (three) times daily with meals.       . insulin lispro (HUMALOG KWIKPEN) 100 UNIT/ML injection Inject 25 Units into the skin 3 (three) times daily before meals.       . Insulin Syringe-Needle U-100 (BD INSULIN SYRINGE ULTRAFINE) 31G X 5/16" 0.5 ML MISC 1cc use  as directed       . LOVAZA 1 G capsule Take 2 capsules by mouth daily.       Marland Kitchen omeprazole (PRILOSEC) 20 MG capsule TAKE ONE CAPSULE AT BEDTIME  30 capsule  3  . sodium polystyrene (KAYEXALATE) powder Take by mouth. 2 oz daily       . TEMAZEPAM PO Take by mouth at bedtime.        Marland Kitchen amLODipine (NORVASC) 10 MG tablet Take 1 tablet by mouth daily.       . calcitRIOL (ROCALTROL) 0.25 MCG capsule 1 tablet by mouth Sunday, T, TH, Sat 2 tablets by mouth M W F       . calcium acetate (ELIPHOS) 667 MG tablet 2 tablets by mouth at each meal       . carvedilol (COREG) 12.5 MG tablet Take 12.5 mg by mouth daily.        . FENOFIBRATE PO Take 134 mg by mouth every evening.        . furosemide (LASIX) 80 MG tablet Take 2 tablets by mouth 2 (two) times daily.       Marland Kitchen loperamide (IMODIUM A-D) 2 MG tablet Take 2 mg by mouth Nightly.        Marland Kitchen MOVIPREP 100 G SOLR       . sodium bicarbonate 650 MG tablet Take 650 mg by mouth 2 (two) times  daily.         Current Facility-Administered Medications on File Prior to Visit  Medication Dose Route Frequency Provider Last Rate Last Dose  . 0.9 %  sodium chloride infusion  500 mL Intravenous Continuous Sheryn Bison, MD        No Known Allergies  Family History  Problem Relation Age of Onset  . Diabetes Mother   . Diabetes Maternal Grandmother   . Diabetes Paternal Grandmother   . Cancer Neg Hx     no FH of Colon Cancer    BP 142/68  Pulse 80  Temp(Src) 98.7 F (37.1 C) (Oral)  Ht 6\' 1"  (1.854 m)  Wt 230 lb 9.6 oz (104.599 kg)  BMI 30.42 kg/m2  SpO2 95%  Review of Systems denies hypoglycemia.  His foot ulcer has recurred    Objective:   Physical Exam GENERAL: no distress Feet: (sees wound care) SKIN:Insulin injection sites at the anterior abdomen are normal    Assessment & Plan:  Dm, needs increased rx Low-back pain.  Prednisone is worsening control Renal failure.  He prob only needs fast-acting insulin here.

## 2010-12-29 ENCOUNTER — Ambulatory Visit (INDEPENDENT_AMBULATORY_CARE_PROVIDER_SITE_OTHER): Payer: Self-pay | Admitting: Cardiovascular Disease

## 2010-12-29 DIAGNOSIS — R0989 Other specified symptoms and signs involving the circulatory and respiratory systems: Secondary | ICD-10-CM

## 2010-12-29 LAB — PROTIME-INR: INR: 1.2 — AB (ref <=1.1)

## 2010-12-31 ENCOUNTER — Other Ambulatory Visit: Payer: Self-pay | Admitting: *Deleted

## 2010-12-31 MED ORDER — GLUCOSE BLOOD VI STRP: ORAL_STRIP | Status: AC

## 2010-12-31 NOTE — Telephone Encounter (Signed)
R'cd fax from [facility] for refill of test strips

## 2011-01-05 ENCOUNTER — Inpatient Hospital Stay (HOSPITAL_COMMUNITY)
Admission: EM | Admit: 2011-01-05 | Discharge: 2011-01-14 | DRG: 416 | Disposition: A | Discharge status: Home / Self Care | Payer: BC Managed Care – PPO | Source: Ambulatory Visit | Attending: Internal Medicine | Admitting: Internal Medicine

## 2011-01-05 ENCOUNTER — Encounter: Payer: Self-pay | Admitting: Internal Medicine

## 2011-01-05 ENCOUNTER — Emergency Department (HOSPITAL_COMMUNITY): Payer: BC Managed Care – PPO

## 2011-01-05 DIAGNOSIS — N058 Unspecified nephritic syndrome with other morphologic changes: Secondary | ICD-10-CM | POA: Diagnosis present

## 2011-01-05 DIAGNOSIS — Z860100 Personal history of colon polyps, unspecified: Secondary | ICD-10-CM

## 2011-01-05 DIAGNOSIS — A020 Salmonella enteritis: Secondary | ICD-10-CM | POA: Diagnosis present

## 2011-01-05 DIAGNOSIS — J4489 Other specified chronic obstructive pulmonary disease: Secondary | ICD-10-CM | POA: Diagnosis present

## 2011-01-05 DIAGNOSIS — A419 Sepsis, unspecified organism: Secondary | ICD-10-CM | POA: Diagnosis present

## 2011-01-05 DIAGNOSIS — R159 Full incontinence of feces: Secondary | ICD-10-CM | POA: Diagnosis present

## 2011-01-05 DIAGNOSIS — J449 Chronic obstructive pulmonary disease, unspecified: Secondary | ICD-10-CM | POA: Diagnosis present

## 2011-01-05 DIAGNOSIS — M549 Dorsalgia, unspecified: Secondary | ICD-10-CM | POA: Diagnosis present

## 2011-01-05 DIAGNOSIS — M869 Osteomyelitis, unspecified: Secondary | ICD-10-CM | POA: Diagnosis present

## 2011-01-05 DIAGNOSIS — Z7901 Long term (current) use of anticoagulants: Secondary | ICD-10-CM

## 2011-01-05 DIAGNOSIS — I4891 Unspecified atrial fibrillation: Secondary | ICD-10-CM | POA: Diagnosis present

## 2011-01-05 DIAGNOSIS — A0472 Enterocolitis due to Clostridium difficile, not specified as recurrent: Secondary | ICD-10-CM

## 2011-01-05 DIAGNOSIS — D649 Anemia, unspecified: Secondary | ICD-10-CM | POA: Diagnosis present

## 2011-01-05 DIAGNOSIS — E1129 Type 2 diabetes mellitus with other diabetic kidney complication: Secondary | ICD-10-CM | POA: Diagnosis present

## 2011-01-05 DIAGNOSIS — A021 Salmonella sepsis: Principal | ICD-10-CM | POA: Diagnosis present

## 2011-01-05 DIAGNOSIS — N529 Male erectile dysfunction, unspecified: Secondary | ICD-10-CM | POA: Diagnosis present

## 2011-01-05 DIAGNOSIS — N186 End stage renal disease: Secondary | ICD-10-CM | POA: Diagnosis present

## 2011-01-05 DIAGNOSIS — Z992 Dependence on renal dialysis: Secondary | ICD-10-CM

## 2011-01-05 DIAGNOSIS — E876 Hypokalemia: Secondary | ICD-10-CM | POA: Diagnosis not present

## 2011-01-05 DIAGNOSIS — M109 Gout, unspecified: Secondary | ICD-10-CM | POA: Diagnosis present

## 2011-01-05 DIAGNOSIS — I441 Atrioventricular block, second degree: Secondary | ICD-10-CM | POA: Diagnosis present

## 2011-01-05 DIAGNOSIS — I495 Sick sinus syndrome: Secondary | ICD-10-CM | POA: Diagnosis present

## 2011-01-05 DIAGNOSIS — I951 Orthostatic hypotension: Secondary | ICD-10-CM | POA: Diagnosis present

## 2011-01-05 DIAGNOSIS — K219 Gastro-esophageal reflux disease without esophagitis: Secondary | ICD-10-CM | POA: Diagnosis present

## 2011-01-05 DIAGNOSIS — I120 Hypertensive chronic kidney disease with stage 5 chronic kidney disease or end stage renal disease: Secondary | ICD-10-CM | POA: Diagnosis present

## 2011-01-05 DIAGNOSIS — G609 Hereditary and idiopathic neuropathy, unspecified: Secondary | ICD-10-CM | POA: Diagnosis present

## 2011-01-05 DIAGNOSIS — F17200 Nicotine dependence, unspecified, uncomplicated: Secondary | ICD-10-CM | POA: Diagnosis present

## 2011-01-05 DIAGNOSIS — L02619 Cutaneous abscess of unspecified foot: Secondary | ICD-10-CM | POA: Diagnosis present

## 2011-01-05 DIAGNOSIS — D696 Thrombocytopenia, unspecified: Secondary | ICD-10-CM | POA: Diagnosis not present

## 2011-01-05 DIAGNOSIS — K3184 Gastroparesis: Secondary | ICD-10-CM | POA: Diagnosis present

## 2011-01-05 DIAGNOSIS — N498 Inflammatory disorders of other specified male genital organs: Secondary | ICD-10-CM | POA: Diagnosis present

## 2011-01-05 DIAGNOSIS — N2581 Secondary hyperparathyroidism of renal origin: Secondary | ICD-10-CM | POA: Diagnosis present

## 2011-01-05 DIAGNOSIS — R197 Diarrhea, unspecified: Secondary | ICD-10-CM

## 2011-01-05 DIAGNOSIS — Z794 Long term (current) use of insulin: Secondary | ICD-10-CM

## 2011-01-05 DIAGNOSIS — I2510 Atherosclerotic heart disease of native coronary artery without angina pectoris: Secondary | ICD-10-CM | POA: Diagnosis present

## 2011-01-05 LAB — TSH: TSH: 0.126 u[IU]/mL — ABNORMAL LOW (ref 0.350–4.500)

## 2011-01-05 LAB — DIFFERENTIAL
Basophils Absolute: 0.1 10*3/uL (ref 0.0–0.1)
Lymphocytes Relative: 7 % — ABNORMAL LOW (ref 12–46)
Monocytes Relative: 4 % (ref 3–12)
Neutro Abs: 11.6 10*3/uL — ABNORMAL HIGH (ref 1.7–7.7)
WBC Morphology: INCREASED

## 2011-01-05 LAB — PROTIME-INR
INR: 1.15 (ref 0.00–1.49)
Prothrombin Time: 14.9 s (ref 11.6–15.2)

## 2011-01-05 LAB — CBC
HCT: 38.9 % — ABNORMAL LOW (ref 39.0–52.0)
Hemoglobin: 13.9 g/dL (ref 13.0–17.0)
MCH: 27.5 pg (ref 26.0–34.0)
MCHC: 35.7 g/dL (ref 30.0–36.0)
MCV: 76.9 fL — ABNORMAL LOW (ref 78.0–100.0)
Platelets: 254 K/uL (ref 150–400)
RBC: 5.06 MIL/uL (ref 4.22–5.81)
RDW: 15.2 % (ref 11.5–15.5)
WBC: 13.1 K/uL — ABNORMAL HIGH (ref 4.0–10.5)

## 2011-01-05 LAB — COMPREHENSIVE METABOLIC PANEL
ALT: 16 U/L (ref 0–53)
Alkaline Phosphatase: 105 U/L (ref 39–117)
CO2: 18 mEq/L — ABNORMAL LOW (ref 19–32)
GFR calc Af Amer: 13 mL/min — ABNORMAL LOW (ref >60)
GFR calc non Af Amer: 11 mL/min — ABNORMAL LOW (ref >60)
Glucose, Bld: 221 mg/dL — ABNORMAL HIGH (ref 70–99)
Potassium: <2 mEq/L — CL (ref 3.5–5.1)
Sodium: 137 mEq/L (ref 135–145)

## 2011-01-05 LAB — BASIC METABOLIC PANEL
Calcium: 8.7 mg/dL (ref 8.4–10.5)
GFR calc non Af Amer: 10 mL/min — ABNORMAL LOW (ref >60)
Sodium: 134 mEq/L — ABNORMAL LOW (ref 135–145)

## 2011-01-05 LAB — MAGNESIUM: Magnesium: 2.4 mg/dL (ref 1.5–2.5)

## 2011-01-05 NOTE — H&P (Signed)
Hospital Admission Note Date: 01/05/2011  Patient name: [name] Medical record number: [id number] Date of birth: [birth year] Age: 60 y.o. Gender: male PCP: Perry Belling, MD, MD  Medical Service: Internal Medicine  Attending physician:     Dr. Josem [name] Resident [phone]): Dr. Baltazar [name]    Pager: [phone] Resident (R1): Dr. Bennie [name]    Pager: [phone]  Chief Complaint:Diarrhea  History of Present Illness:[name] is a pleasant 60 year old man with past medical history significant for HTN, DM, ESRD on HD T,Th, S, Paroxysmal a-fib who presents to the ER for diarrhea. Patient was present with his wife and come to the ER after completion of dialysis. He states he has been having loose watery bowel movements that began 7 days ago. He reports initially having greater than 5 loose watery stools. He describes the stools to be dark in color but did not note any blood. He also has a history of gastroparesis and vomits on average once a day. However, he has also been vomiting for the last seven days. Vomitus is clear without blood. He has tried to drink fluids, however unable to tolerate fluids because of persistent nausea. He reports his wife having a similar diarrhea-vomiting like illness three weeks prior which had resolved spontaneously. He also reports taking antibiotics for right foot plantar abscess which he stopped approximately 3 weeks ago on June 30th, 2012. He reports taking Doxycycline once a day. He denied having chest pain, shortness of breath, cough, or abdominal pain, but did endorse feeling fatigued and "worn out." His initial EKG showed atrial fibrillation, however patient denied palpitations. No recent travel and denies having fevers and chills.  Meds: 1.) Norvasc 2.) Fosrenol 3.) Nephrovite 4.) Omeprazole 5.) Tums 6.) Humalog 30 units TID 7.) Temazepam 8.) Lipitor 80 mg po qday 9.) Allopurinol 100 mg po qday 10.) Tricor 145 mg po qday 11.) Coumadin 5 mg 1/2 tab po day 12.) Lovaza 1 g po  bid   Allergies: Review of patient's allergies indicates no known allergies.  Past Medical History  Diagnosis Date  . Chronic kidney disease     dialysis t,th,sat  . GERD (gastroesophageal reflux disease)   . Hypertension   . Neuromuscular disorder     neurophathy  . Atrial fibrillation     goes in and out  . Hyperlipidemia   . Atrial flutter 07/07/2009  . DIABETES MELLITUS, TYPE II - A1C 9.5 08/01/2008  . GOUT 08/01/2008  . ERECTILE DYSFUNCTION 08/01/2008  . PERIPHERAL NEUROPATHY 08/01/2008  . CORONARY ARTERY DISEASE - CATH 2009 mild nonobstructive disease 08/01/2008    non obstructive per cath 1/10; Dr. Juanda Chance  . SVT/ PSVT/ PAT 07/30/2009  . Gastroparesis 08/01/2008  . RENAL INSUFFICIENCY 08/01/2008  . NEPHROLITHIASIS, HX OF 08/01/2008  . Chronic systolic heart failure EF 45-50%, Diffuse hypokinesis Jan 2011 06/21/2010  . COPD 07/21/2010  . RENAL FAILURE-CHRONIC 07/21/2010  . Long term current use of anticoagulant 08/26/2010  . Diverticulosis of colon (without mention of hemorrhage) 10/28/2010  . Benign neoplasm of colon 10/28/2010  . Hemorrhoids, internal 10/28/2010  . Renal cyst     bilateral   Osteomyelitis with [address] grade 3 ulceration/Charcot collapse of       the right foot/diabetic neuropathy, status post excision of cuboid,       lateral cuneiform with complex wound closure.  - April 2011   Past Surgical History  Procedure Date  . Atrial ablation surgery 07-2008, 07/08/09    Dr Johney Frame  . Right foot 09-2009  Infection - Osteomyelitis with Wagner grade 3 ulceration.   2. Charcot collapse of the right foot.   . Colonoscopy 60yrs ago    Dr.Orr  . Av fistula placement     left upper arm  . S/p right arm surgery after fracture   . Pilonidal cyst excision     s/p   Family History  Problem Relation Age of Onset  . Diabetes, Glioblastoma Mother Deceased age 6  . Diabetes Maternal Grandmother   . Diabetes Paternal Grandmother   . Cancer Neg Hx     no FH of Colon Cancer    Father deceased from AAA, age 15  History   Social History  . Marital Status: Married    Spouse Name: N/A    Number of Children: 3  . Years of Education: 3 years of community college   Occupational History  . Sales Rep    Social History Main Topics  . Smoking status: Current Everyday Smoker -- 0.5 packs/day for 40 years    Types: Cigarettes  . Smokeless tobacco: Not on file  . Alcohol Use: No  . Drug Use: No  . Sexually Active:    Other Topics Concern  . 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[employer]    Review of Systems: Pertinent items are noted in HPI.  Physical Exam:  BP: 128/68  HR:73  R:16  O2 sats: 99% RA  Temp:  Gen: NAD, appears stated age Head: NT/AT Lungs: CTAB Heart: irregularly, irregular Abdomen: soft, slightly distended, normal bowel sounds, tender to palpation in right mid-quadrant with rebound tenderness, no organomegaly Back: small 3 cm cyst located on lower lumbar spine, nontender to palpation Extr: right foot wrapped in ace-wrap, small amount of drainage from right food wound, decreased per patient Neuro: nonfocal   Lab results: Results for orders placed during the hospital encounter of 01/05/11 (from the past 24 hour(s))  DIFFERENTIAL     Status: Abnormal   Collection Time   01/05/11 11:52 AM      Component Value Range   Neutrophils Relative 88 (*) 43 - 77 (%)   Lymphocytes Relative 7 (*) 12 - 46 (%)   Monocytes Relative 4  3 - 12 (%)   Eosinophils Relative 0  0 - 5 (%)   Basophils Relative 1  0 - 1 (%)   Neutro Abs 11.6 (*) 1.7 - 7.7 (K/uL)   Lymphs Abs 0.9  0.7 - 4.0 (K/uL)   Monocytes Absolute 0.5  0.1 - 1.0 (K/uL)   Eosinophils Absolute 0.0  0.0 - 0.7 (K/uL)   Basophils Absolute 0.1  0.0 - 0.1 (K/uL)   WBC Morphology INCREASED BANDS (>20% BANDS)    CBC     Status: Abnormal   Collection Time   01/05/11 11:52 AM      Component Value Range   WBC 13.1 (*) 4.0 - 10.5 (K/uL)   RBC 5.06  4.22 - 5.81 (MIL/uL)    Hemoglobin 13.9  13.0 - 17.0 (g/dL)   HCT 40.9 (*) 81.1 - 52.0 (%)   MCV 76.9 (*) 78.0 - 100.0 (fL)   MCH 27.5  26.0 - 34.0 (pg)   MCHC 35.7  30.0 - 36.0 (g/dL)   RDW 91.4  78.2 - 95.6 (%)   Platelets 254  150 - 400 (K/uL)  COMPREHENSIVE METABOLIC PANEL     Status: Abnormal   Collection Time   01/05/11 11:52 AM      Component Value  Range   Sodium 137  135 - 145 (mEq/L)   Potassium <2.0 (*) 3.5 - 5.1 (mEq/L)   Chloride 95 (*) 96 - 112 (mEq/L)   CO2 18 (*) 19 - 32 (mEq/L)   Glucose, Bld 221 (*) 70 - 99 (mg/dL)   BUN 73 (*) 6 - 23 (mg/dL)   Creatinine, Ser 1.61 (*) 0.50 - 1.35 (mg/dL)   Calcium 8.8  8.4 - 09.6 (mg/dL)   Total Protein 6.7  6.0 - 8.3 (g/dL)   Albumin 2.4 (*) 3.5 - 5.2 (g/dL)   AST 19  0 - 37 (U/L)   ALT 16  0 - 53 (U/L)   Alkaline Phosphatase 105  39 - 117 (U/L)   Total Bilirubin 0.4  0.3 - 1.2 (mg/dL)   GFR calc non Af Amer 11 (*) >60 (mL/min)   GFR calc Af Amer 13 (*) >60 (mL/min)  LIPASE, BLOOD     Status: Normal   Collection Time   01/05/11 11:52 AM      Component Value Range   Lipase 49  11 - 59 (U/L)  PROTIME-INR     Status: Normal   Collection Time   01/05/11  2:04 PM      Component Value Range   Prothrombin Time 14.9  11.6 - 15.2 (seconds)   INR 1.15  0.00 - 1.49     Imaging results:  Dg Abd Acute W/chest  01/05/2011  *RADIOLOGY REPORT*  Clinical Data: Vomiting, diarrhea for a week, weakness  ACUTE ABDOMEN SERIES (ABDOMEN 2 VIEW & CHEST 1 VIEW)  Comparison: Chest x-ray of 11/13/2010  Findings: The lungs are clear.  The heart is within upper limits of normal.  No bony abnormality is seen.  Supine and erect views of the abdomen show no bowel obstruction. There are a few scattered air-fluid levels within the nondistended colon which can be seen with diarrhea or recent enemas.  No free air is seen.  No opaque calculi are noted.  IMPRESSION: 1.  No active lung disease.  2.  A few air-fluid levels in the colon can be seen with diarrhea or enemas.  No bowel  obstruction or free air.  Original Report Authenticated By: Juline Patch, M.D.    Other results: EKG: afib  Assessment & Plan by Problem:  1.) Diarrhea - differential includes C diff vs viral gastroenteritis. Pt does have hx of recent abx use and diarrhea description of being loose and watery would make c diff probable. He also has hx of his wife having a similar illness which makes viral gastroenteritis also likely. Other causes such as Giardia are less likely as patient has not recently traveled and no other recreational activities where he would be exposed to Giardia.   Plan: -C diff by PCR -Follow C diff protocol and empirically treat with Flagyl as it is patient's first episode -Gentle hydration as pt is ESRD  2.) Hypokalemia - secondary to GI losses -Will replete with po potassium   3.) A-fib - Paroxysmal and s/p ablation x2 by Dr. Johney Frame. Pt has been in and out of a-fib in the past. He is currently rate controlled and anticoagulated with coumadin. INR slightly subtherapeutic as patient has not been able to tolerate po meds.  -Will recheck TSH -Gentle hydration -F/U AM EKG -Continue coumadin   4.) ESRD on HD T,Th,S - has completed HD today. Receives dialysis at [facility], L arm fistula. Will consult renal to resume HD if patient stays until Thursday.  5.) Hx of R foot abscess and osteomyelitis - draining less per patient. Of note patient had Ostectomy, right midfoot,  irrigation and debridement of skin and soft tissue and bone, placement of antibiotic beads with 1 g vancomycin and 240 mg of       gentamicin. He was discharged on doxycycline to be taken for 30 days upon discharge.   6.) Leukocytosis - WBC 13.1 today with elevated % neu and ANC. Likely secondary to diarrhea vs ongoing right foot abscess. Of note patient's white count on 11/14/2010 was 16. Currently afebrile, will continue to monitor and manage per #1.  7.) VTE Prophylaxis:  Coumadin      R2/3______________________________ Melida Quitter [phone]      R1________________________________  ATTENDING: I performed and/or observed a history and physical examination of the patient.  I discussed the case with the residents as noted and reviewed the residents' notes.  I agree with the findings and plan--please refer to the attending physician note for more details.  Signature________________________________  Printed Name_____________________________

## 2011-01-06 ENCOUNTER — Observation Stay (HOSPITAL_COMMUNITY): Payer: BC Managed Care – PPO

## 2011-01-06 DIAGNOSIS — I4891 Unspecified atrial fibrillation: Secondary | ICD-10-CM

## 2011-01-06 DIAGNOSIS — A0472 Enterocolitis due to Clostridium difficile, not specified as recurrent: Secondary | ICD-10-CM

## 2011-01-06 DIAGNOSIS — R197 Diarrhea, unspecified: Secondary | ICD-10-CM

## 2011-01-06 LAB — BASIC METABOLIC PANEL
CO2: 17 mEq/L — ABNORMAL LOW (ref 19–32)
Calcium: 8.2 mg/dL — ABNORMAL LOW (ref 8.4–10.5)
Chloride: 98 mEq/L (ref 96–112)
Creatinine, Ser: 6.86 mg/dL — ABNORMAL HIGH (ref 0.50–1.35)
GFR calc Af Amer: 10 mL/min — ABNORMAL LOW (ref >60)
GFR calc Af Amer: 10 mL/min — ABNORMAL LOW (ref >60)
GFR calc non Af Amer: 8 mL/min — ABNORMAL LOW (ref >60)
Glucose, Bld: 148 mg/dL — ABNORMAL HIGH (ref 70–99)
Potassium: 2.5 mEq/L — CL (ref 3.5–5.1)
Potassium: 2.4 mEq/L — CL (ref 3.5–5.1)
Sodium: 130 mEq/L — ABNORMAL LOW (ref 135–145)

## 2011-01-06 LAB — PROTIME-INR
INR: 1.26 (ref 0.00–1.49)
Prothrombin Time: 16.1 seconds — ABNORMAL HIGH (ref 11.6–15.2)

## 2011-01-06 LAB — CBC
HCT: 35.1 % — ABNORMAL LOW (ref 39.0–52.0)
MCH: 27.4 pg (ref 26.0–34.0)
MCHC: 35 g/dL (ref 30.0–36.0)
RDW: 15.2 % (ref 11.5–15.5)

## 2011-01-06 LAB — GLUCOSE, CAPILLARY
Glucose-Capillary: 181 mg/dL — ABNORMAL HIGH (ref 70–99)
Glucose-Capillary: 167 mg/dL — ABNORMAL HIGH (ref 70–99)
Glucose-Capillary: 135 mg/dL — ABNORMAL HIGH (ref 70–99)

## 2011-01-06 LAB — CARDIAC PANEL(CRET KIN+CKTOT+MB+TROPI)
Relative Index: INVALID (ref 0.0–2.5)
Troponin I: <0.3 ng/mL (ref <0.30)

## 2011-01-06 LAB — URINALYSIS, ROUTINE W REFLEX MICROSCOPIC
Bilirubin Urine: NEGATIVE
Leukocytes, UA: NEGATIVE
Nitrite: NEGATIVE
Specific Gravity, Urine: 1.017 (ref 1.005–1.030)
Urobilinogen, UA: 0.2 mg/dL (ref 0.0–1.0)
pH: 5 (ref 5.0–8.0)

## 2011-01-06 LAB — URINE MICROSCOPIC-ADD ON

## 2011-01-06 LAB — RENAL FUNCTION PANEL
BUN: 78 mg/dL — ABNORMAL HIGH (ref 6–23)
CO2: 17 mEq/L — ABNORMAL LOW (ref 19–32)
Calcium: 8.6 mg/dL (ref 8.4–10.5)
Creatinine, Ser: 6.7 mg/dL — ABNORMAL HIGH (ref 0.50–1.35)
GFR calc non Af Amer: 8 mL/min — ABNORMAL LOW (ref >60)

## 2011-01-07 ENCOUNTER — Encounter: Payer: Self-pay | Admitting: Internal Medicine

## 2011-01-07 ENCOUNTER — Encounter (HOSPITAL_COMMUNITY): Payer: Self-pay | Admitting: Radiology

## 2011-01-07 ENCOUNTER — Observation Stay (HOSPITAL_COMMUNITY): Payer: BC Managed Care – PPO

## 2011-01-07 LAB — CBC
MCH: 27.3 pg (ref 26.0–34.0)
MCV: 77.7 fL — ABNORMAL LOW (ref 78.0–100.0)
Platelets: 210 10*3/uL (ref 150–400)
RBC: 4.44 MIL/uL (ref 4.22–5.81)
RDW: 15.4 % (ref 11.5–15.5)
WBC: 18.1 10*3/uL — ABNORMAL HIGH (ref 4.0–10.5)

## 2011-01-07 LAB — DIFFERENTIAL
Basophils Absolute: 0 10*3/uL (ref 0.0–0.1)
Basophils Relative: 0 % (ref 0–1)
Eosinophils Absolute: 0 10*3/uL (ref 0.0–0.7)
Lymphocytes Relative: 5 % — ABNORMAL LOW (ref 12–46)
Monocytes Absolute: 0.7 10*3/uL (ref 0.1–1.0)
Neutrophils Relative %: 91 % — ABNORMAL HIGH (ref 43–77)

## 2011-01-07 LAB — RENAL FUNCTION PANEL
BUN: 81 mg/dL — ABNORMAL HIGH (ref 6–23)
CO2: 11 mEq/L — ABNORMAL LOW (ref 19–32)
Chloride: 99 mEq/L (ref 96–112)
Creatinine, Ser: 7.51 mg/dL — ABNORMAL HIGH (ref 0.50–1.35)
Glucose, Bld: 173 mg/dL — ABNORMAL HIGH (ref 70–99)
Potassium: 3.4 mEq/L — ABNORMAL LOW (ref 3.5–5.1)

## 2011-01-07 LAB — BASIC METABOLIC PANEL
BUN: 79 mg/dL — ABNORMAL HIGH (ref 6–23)
CO2: 14 mEq/L — ABNORMAL LOW (ref 19–32)
Glucose, Bld: 145 mg/dL — ABNORMAL HIGH (ref 70–99)
Potassium: 2.6 mEq/L — CL (ref 3.5–5.1)
Sodium: 129 mEq/L — ABNORMAL LOW (ref 135–145)

## 2011-01-07 LAB — URINE CULTURE: Culture  Setup Time: 201207251747

## 2011-01-07 LAB — GLUCOSE, CAPILLARY
Glucose-Capillary: 274 mg/dL — ABNORMAL HIGH (ref 70–99)
Glucose-Capillary: 132 mg/dL — ABNORMAL HIGH (ref 70–99)

## 2011-01-07 LAB — HEPATITIS B SURFACE ANTIGEN: Hepatitis B Surface Ag: NEGATIVE

## 2011-01-07 LAB — PROTIME-INR: Prothrombin Time: 17.4 seconds — ABNORMAL HIGH (ref 11.6–15.2)

## 2011-01-08 ENCOUNTER — Ambulatory Visit: Payer: BC Managed Care – PPO | Admitting: Endocrinology

## 2011-01-08 ENCOUNTER — Encounter: Payer: Self-pay | Admitting: Internal Medicine

## 2011-01-08 LAB — GLUCOSE, CAPILLARY: Glucose-Capillary: 184 mg/dL — ABNORMAL HIGH (ref 70–99)

## 2011-01-08 LAB — MRSA PCR SCREENING: MRSA by PCR: NEGATIVE

## 2011-01-08 LAB — PROTIME-INR: Prothrombin Time: 23.1 seconds — ABNORMAL HIGH (ref 11.6–15.2)

## 2011-01-09 ENCOUNTER — Inpatient Hospital Stay (HOSPITAL_COMMUNITY): Payer: BC Managed Care – PPO

## 2011-01-09 DIAGNOSIS — I471 Supraventricular tachycardia: Secondary | ICD-10-CM

## 2011-01-09 DIAGNOSIS — I441 Atrioventricular block, second degree: Secondary | ICD-10-CM

## 2011-01-09 LAB — BASIC METABOLIC PANEL
CO2: 19 mEq/L (ref 19–32)
Calcium: 8 mg/dL — ABNORMAL LOW (ref 8.4–10.5)
Creatinine, Ser: 7.57 mg/dL — ABNORMAL HIGH (ref 0.50–1.35)
GFR calc Af Amer: 9 mL/min — ABNORMAL LOW (ref >60)
GFR calc non Af Amer: 7 mL/min — ABNORMAL LOW (ref >60)
Sodium: 130 mEq/L — ABNORMAL LOW (ref 135–145)

## 2011-01-09 LAB — PROTIME-INR
INR: 3.77 — ABNORMAL HIGH (ref 0.00–1.49)
Prothrombin Time: 37.8 seconds — ABNORMAL HIGH (ref 11.6–15.2)

## 2011-01-09 LAB — CBC
MCHC: 35.1 g/dL (ref 30.0–36.0)
Platelets: 118 10*3/uL — ABNORMAL LOW (ref 150–400)
RDW: 15.7 % — ABNORMAL HIGH (ref 11.5–15.5)
WBC: 13 10*3/uL — ABNORMAL HIGH (ref 4.0–10.5)

## 2011-01-09 LAB — GLUCOSE, CAPILLARY
Glucose-Capillary: 211 mg/dL — ABNORMAL HIGH (ref 70–99)
Glucose-Capillary: 285 mg/dL — ABNORMAL HIGH (ref 70–99)

## 2011-01-10 LAB — CULTURE, BLOOD (ROUTINE X 2)
Culture  Setup Time: 201207260136
Culture  Setup Time: 201207260349
Culture  Setup Time: 201207260349

## 2011-01-10 LAB — BASIC METABOLIC PANEL
BUN: 35 mg/dL — ABNORMAL HIGH (ref 6–23)
CO2: 23 mEq/L (ref 19–32)
Calcium: 8.6 mg/dL (ref 8.4–10.5)
Chloride: 98 mEq/L (ref 96–112)
Creatinine, Ser: 5.53 mg/dL — ABNORMAL HIGH (ref 0.50–1.35)
GFR calc Af Amer: 13 mL/min — ABNORMAL LOW (ref >60)
GFR calc non Af Amer: 11 mL/min — ABNORMAL LOW (ref >60)
GFR calc non Af Amer: 13 mL/min — ABNORMAL LOW (ref >60)
Glucose, Bld: 262 mg/dL — ABNORMAL HIGH (ref 70–99)
Glucose, Bld: 383 mg/dL — ABNORMAL HIGH (ref 70–99)
Potassium: 2.8 mEq/L — ABNORMAL LOW (ref 3.5–5.1)
Potassium: 3.1 mEq/L — ABNORMAL LOW (ref 3.5–5.1)
Sodium: 129 mEq/L — ABNORMAL LOW (ref 135–145)
Sodium: 136 mEq/L (ref 135–145)

## 2011-01-10 LAB — CBC
HCT: 32 % — ABNORMAL LOW (ref 39.0–52.0)
Hemoglobin: 10.9 g/dL — ABNORMAL LOW (ref 13.0–17.0)
MCH: 27.3 pg (ref 26.0–34.0)
RBC: 3.99 MIL/uL — ABNORMAL LOW (ref 4.22–5.81)

## 2011-01-10 LAB — GLUCOSE, CAPILLARY: Glucose-Capillary: 276 mg/dL — ABNORMAL HIGH (ref 70–99)

## 2011-01-10 LAB — PROTIME-INR
INR: 2.83 — ABNORMAL HIGH (ref 0.00–1.49)
Prothrombin Time: 30.2 seconds — ABNORMAL HIGH (ref 11.6–15.2)

## 2011-01-11 ENCOUNTER — Ambulatory Visit: Payer: BC Managed Care – PPO | Admitting: Internal Medicine

## 2011-01-11 ENCOUNTER — Other Ambulatory Visit: Payer: Self-pay | Admitting: *Deleted

## 2011-01-11 LAB — RENAL FUNCTION PANEL
Albumin: 1.8 g/dL — ABNORMAL LOW (ref 3.5–5.2)
CO2: 23 mEq/L (ref 19–32)
Calcium: 7.8 mg/dL — ABNORMAL LOW (ref 8.4–10.5)
Creatinine, Ser: 6.36 mg/dL — ABNORMAL HIGH (ref 0.50–1.35)
GFR calc Af Amer: 11 mL/min — ABNORMAL LOW (ref >60)
GFR calc non Af Amer: 9 mL/min — ABNORMAL LOW (ref >60)
Phosphorus: 3 mg/dL (ref 2.3–4.6)
Sodium: 130 mEq/L — ABNORMAL LOW (ref 135–145)

## 2011-01-11 LAB — GLUCOSE, CAPILLARY
Glucose-Capillary: 215 mg/dL — ABNORMAL HIGH (ref 70–99)
Glucose-Capillary: 310 mg/dL — ABNORMAL HIGH (ref 70–99)

## 2011-01-11 LAB — CBC
HCT: 31.3 % — ABNORMAL LOW (ref 39.0–52.0)
Hemoglobin: 10.3 g/dL — ABNORMAL LOW (ref 13.0–17.0)
MCHC: 32.9 g/dL (ref 30.0–36.0)
RDW: 15.9 % — ABNORMAL HIGH (ref 11.5–15.5)
WBC: 11.6 10*3/uL — ABNORMAL HIGH (ref 4.0–10.5)

## 2011-01-11 LAB — DIFFERENTIAL
Basophils Absolute: 0 10*3/uL (ref 0.0–0.1)
Basophils Relative: 0 % (ref 0–1)
Eosinophils Relative: 2 % (ref 0–5)
Lymphocytes Relative: 11 % — ABNORMAL LOW (ref 12–46)
Neutro Abs: 9.2 10*3/uL — ABNORMAL HIGH (ref 1.7–7.7)

## 2011-01-11 LAB — GIARDIA/CRYPTOSPORIDIUM SCREEN(EIA)

## 2011-01-11 LAB — PROTIME-INR
INR: 3.01 — ABNORMAL HIGH (ref 0.00–1.49)
Prothrombin Time: 31.7 seconds — ABNORMAL HIGH (ref 11.6–15.2)

## 2011-01-11 MED ORDER — OMEGA-3-ACID ETHYL ESTERS 1 G PO CAPS: 2.0000 | ORAL_CAPSULE | Freq: Every day | ORAL | Status: DC

## 2011-01-11 NOTE — Telephone Encounter (Signed)
R'cd fax from [facility] for refill of Lovaza  Last OV-12/28/2010  Last Filled-02/03/2010

## 2011-01-12 ENCOUNTER — Inpatient Hospital Stay (HOSPITAL_COMMUNITY): Payer: BC Managed Care – PPO

## 2011-01-12 DIAGNOSIS — R7881 Bacteremia: Secondary | ICD-10-CM

## 2011-01-12 DIAGNOSIS — A020 Salmonella enteritis: Secondary | ICD-10-CM

## 2011-01-12 DIAGNOSIS — A0472 Enterocolitis due to Clostridium difficile, not specified as recurrent: Secondary | ICD-10-CM

## 2011-01-12 DIAGNOSIS — I4891 Unspecified atrial fibrillation: Secondary | ICD-10-CM

## 2011-01-12 DIAGNOSIS — R197 Diarrhea, unspecified: Secondary | ICD-10-CM

## 2011-01-12 LAB — CBC
Hemoglobin: 10.6 g/dL — ABNORMAL LOW (ref 13.0–17.0)
MCH: 27.2 pg (ref 26.0–34.0)
MCH: 26.9 pg (ref 26.0–34.0)
MCV: 80.2 fL (ref 78.0–100.0)
Platelets: 101 10*3/uL — ABNORMAL LOW (ref 150–400)
Platelets: 125 10*3/uL — ABNORMAL LOW (ref 150–400)
RBC: 3.89 MIL/uL — ABNORMAL LOW (ref 4.22–5.81)
RBC: 3.83 MIL/uL — ABNORMAL LOW (ref 4.22–5.81)
RDW: 15.9 % — ABNORMAL HIGH (ref 11.5–15.5)
WBC: 10.2 10*3/uL (ref 4.0–10.5)

## 2011-01-12 LAB — PROTIME-INR
INR: 3.02 — ABNORMAL HIGH (ref 0.00–1.49)
Prothrombin Time: 31.8 seconds — ABNORMAL HIGH (ref 11.6–15.2)

## 2011-01-12 LAB — BASIC METABOLIC PANEL
BUN: 51 mg/dL — ABNORMAL HIGH (ref 6–23)
Chloride: 96 mEq/L (ref 96–112)
GFR calc Af Amer: 9 mL/min — ABNORMAL LOW (ref >60)
GFR calc non Af Amer: 8 mL/min — ABNORMAL LOW (ref >60)
Potassium: 3.7 mEq/L (ref 3.5–5.1)

## 2011-01-12 LAB — GLUCOSE, CAPILLARY
Glucose-Capillary: 263 mg/dL — ABNORMAL HIGH (ref 70–99)
Glucose-Capillary: 323 mg/dL — ABNORMAL HIGH (ref 70–99)

## 2011-01-12 LAB — RENAL FUNCTION PANEL
CO2: 19 mEq/L (ref 19–32)
Calcium: 7.9 mg/dL — ABNORMAL LOW (ref 8.4–10.5)
Creatinine, Ser: 7.46 mg/dL — ABNORMAL HIGH (ref 0.50–1.35)
GFR calc non Af Amer: 7 mL/min — ABNORMAL LOW (ref >60)

## 2011-01-13 DIAGNOSIS — R197 Diarrhea, unspecified: Secondary | ICD-10-CM

## 2011-01-13 DIAGNOSIS — A0472 Enterocolitis due to Clostridium difficile, not specified as recurrent: Secondary | ICD-10-CM

## 2011-01-13 DIAGNOSIS — I4891 Unspecified atrial fibrillation: Secondary | ICD-10-CM

## 2011-01-13 LAB — BASIC METABOLIC PANEL
BUN: 29 mg/dL — ABNORMAL HIGH (ref 6–23)
CO2: 27 mEq/L (ref 19–32)
Chloride: 98 mEq/L (ref 96–112)
Glucose, Bld: 349 mg/dL — ABNORMAL HIGH (ref 70–99)
Potassium: 3.5 mEq/L (ref 3.5–5.1)

## 2011-01-13 LAB — STOOL CULTURE

## 2011-01-13 LAB — CBC
HCT: 32.8 % — ABNORMAL LOW (ref 39.0–52.0)
Hemoglobin: 11 g/dL — ABNORMAL LOW (ref 13.0–17.0)
MCHC: 33.5 g/dL (ref 30.0–36.0)
RBC: 4.05 MIL/uL — ABNORMAL LOW (ref 4.22–5.81)

## 2011-01-13 LAB — GLUCOSE, CAPILLARY: Glucose-Capillary: 367 mg/dL — ABNORMAL HIGH (ref 70–99)

## 2011-01-14 ENCOUNTER — Inpatient Hospital Stay (HOSPITAL_COMMUNITY): Payer: BC Managed Care – PPO

## 2011-01-14 DIAGNOSIS — A0472 Enterocolitis due to Clostridium difficile, not specified as recurrent: Secondary | ICD-10-CM

## 2011-01-14 DIAGNOSIS — R197 Diarrhea, unspecified: Secondary | ICD-10-CM

## 2011-01-14 DIAGNOSIS — I4891 Unspecified atrial fibrillation: Secondary | ICD-10-CM

## 2011-01-14 LAB — RENAL FUNCTION PANEL
Albumin: 1.8 g/dL — ABNORMAL LOW (ref 3.5–5.2)
CO2: 25 mEq/L (ref 19–32)
Chloride: 97 mEq/L (ref 96–112)
GFR calc Af Amer: 12 mL/min — ABNORMAL LOW (ref >60)
GFR calc non Af Amer: 10 mL/min — ABNORMAL LOW (ref >60)
Sodium: 132 mEq/L — ABNORMAL LOW (ref 135–145)

## 2011-01-14 LAB — CBC
HCT: 29.6 % — ABNORMAL LOW (ref 39.0–52.0)
Hemoglobin: 9.8 g/dL — ABNORMAL LOW (ref 13.0–17.0)
RBC: 3.67 MIL/uL — ABNORMAL LOW (ref 4.22–5.81)
WBC: 7.1 10*3/uL (ref 4.0–10.5)

## 2011-01-14 LAB — GLUCOSE, CAPILLARY
Glucose-Capillary: 484 mg/dL — ABNORMAL HIGH (ref 70–99)
Glucose-Capillary: 349 mg/dL — ABNORMAL HIGH (ref 70–99)
Glucose-Capillary: 342 mg/dL — ABNORMAL HIGH (ref 70–99)
Glucose-Capillary: 185 mg/dL — ABNORMAL HIGH (ref 70–99)

## 2011-01-14 LAB — IRON AND TIBC: Saturation Ratios: 28 % (ref 20–55)

## 2011-01-14 LAB — PROTIME-INR
INR: 3.26 — ABNORMAL HIGH (ref 0.00–1.49)
Prothrombin Time: 33.7 seconds — ABNORMAL HIGH (ref 11.6–15.2)

## 2011-01-14 LAB — FERRITIN: Ferritin: 253 ng/mL (ref 22–322)

## 2011-01-16 LAB — CULTURE, BLOOD (ROUTINE X 2)
Culture  Setup Time: 201207291758
Culture: NO GROWTH
Culture: NO GROWTH

## 2011-01-16 LAB — PROTIME-INR: INR: 4 — AB (ref <=1.1)

## 2011-01-18 ENCOUNTER — Ambulatory Visit: Payer: BC Managed Care – PPO | Admitting: Endocrinology

## 2011-01-18 ENCOUNTER — Telehealth: Payer: Self-pay | Admitting: Internal Medicine

## 2011-01-18 ENCOUNTER — Ambulatory Visit (INDEPENDENT_AMBULATORY_CARE_PROVIDER_SITE_OTHER): Payer: Self-pay | Admitting: Cardiology

## 2011-01-18 DIAGNOSIS — R0989 Other specified symptoms and signs involving the circulatory and respiratory systems: Secondary | ICD-10-CM

## 2011-01-18 NOTE — Telephone Encounter (Signed)
Patient called.  He just needs to get a follow up appt

## 2011-01-18 NOTE — Telephone Encounter (Signed)
Pt was dc from hospital and was told to see dr allred today, told him he's out this week, he's not even sure why he was told to come in so soon, wants to talk with kelly

## 2011-01-20 ENCOUNTER — Ambulatory Visit: Payer: BC Managed Care – PPO | Admitting: Endocrinology

## 2011-01-20 DIAGNOSIS — Z0289 Encounter for other administrative examinations: Secondary | ICD-10-CM

## 2011-01-25 ENCOUNTER — Ambulatory Visit (INDEPENDENT_AMBULATORY_CARE_PROVIDER_SITE_OTHER): Payer: Self-pay | Admitting: Internal Medicine

## 2011-01-25 DIAGNOSIS — R0989 Other specified symptoms and signs involving the circulatory and respiratory systems: Secondary | ICD-10-CM

## 2011-01-25 LAB — CULTURE, BLOOD (ROUTINE X 2): Culture  Setup Time: 201207260136

## 2011-01-26 ENCOUNTER — Other Ambulatory Visit: Payer: Self-pay | Admitting: Internal Medicine

## 2011-01-29 NOTE — Consult Note (Signed)
[name], STIEFEL                 ACCOUNT NO.:  [id number]  MEDICAL RECORD NO.:  [id number]  LOCATION:  3311                         FACILITY:  [facility]  PHYSICIAN:  Danae Chen, M.D.  DATE OF BIRTH:  08/18/1950  DATE OF CONSULTATION:  01/07/2011 DATE OF DISCHARGE:                                CONSULTATION   REASON FOR CONSULTATION:  Swelling pain of scrotum, question Fournier gangrene.  The patient is a 60 year old male who has a history of recent hypertension, diabetes, end-stage renal disease, atrial fibrillation. He was seen in the emergency room on January 05, 2011 with diarrhea.  He has been having loose bowel movements for 7 days associated with vomiting.  He became tachycardic last night and had a temperature of 102.7 and a pulse rate of 145.  He was complaining of pain in the left scrotum and according to his primary care physician, he was found to have swelling of the left scrotum and there was concern about Fournier gangrene and I was asked her to see him in consultation.  The patient states that he had not been able to see his scrotum because of back pain; however, he has been having severe pain in the scrotum.  He is on dialysis and voids moderate amount of urine on his own.  His past medical history is positive for diabetes, hypertension, end- stage renal disease, and atrial fibrillation.  PAST SURGICAL HISTORY:  He had atrial fibrillation surgery in February 2010 and in January 2011.  He had ostectomy, right mid foot on November 14, 2010.  He had AV fistula placement and he had right arm surgery after fracture.  FAMILY HISTORY:  His father died at age of 71 of an aneurysm.  His mother had glioblastoma and died 4 years ago.  He has one sister who is in good health.  SOCIAL HISTORY:  He is married, has three children.  He has smoked half a pack a day for 40 years and does not drink.  MEDICATIONS: 1. Norvasc. 2. Fosrenol. 3. Nephro-Vite. 4. Omeprazole. 5.  Tums. 6. Humalog 30 units three times a day. 7. Temazepam. 8. Lipitor 80 mg a day. 9. Allopurinol 100 mg a day. 10.TriCor 145 mg a day. 11.Coumadin 5 mg half tablet a day. 12.Lovaza 1 g b.i.d.  ALLERGIES:  No known drug allergies.  REVIEW OF SYSTEMS:  As noted in the HPI and everything else is negative.  PHYSICAL EXAMINATION:  GENERAL:  This is a well-developed 60 year old male who is complaining of left scrotal pain.  He is alert and oriented to time, place and person. VITAL SIGNS:  His blood pressure is 125/56, pulse 123, respirations 23, and temperature 97.8. SKIN:  His skin is warm and dry. HEENT:  His head is normal. LUNGS:  He has no labored breathing. ABDOMEN:  His abdomen is soft, nondistended, and nontender.  He has no CVA tenderness.  Kidneys are not palpable.  He has no hepatomegaly, no splenomegaly.  Bladder is not distended. GU:  Penis and meatus are normal.  The scrotum is not swollen.  There is some redness of the scrotal skin on the left side with some superficial ulceration of  the skin.  There is no edema.  There is no hard area of the scrotum and there is no evidence of any abscess.  Both testicles feel normal. RECTAL:  Deferred.  CT scan of the pelvis showed symmetric appearance of both scrotal sacs with some skin thickening on both sides.  His creatinine is 7.51, BUN 81, sodium 130, potassium 3.4, glucose 173. Hemoglobin 12.1, hematocrit 34.5, and WBC 18.1.  Urine culture showed multiple bacteria.  IMPRESSION: 1. Cellulitis of the scrotum.  I do not see any evidence of scrotal     abscess.  There is no evidence of Fournier gangrene. 2. Renal insufficiency. 3. Diabetes. 4. Hypertension. 5. Atrial fibrillation.  SUGGESTIONS:  Apply Mycolog ointment to the scrotum.  Keep the scrotum elevated.  We will follow the patient with you.     Danae Chen, M.D.     MN/MEDQ  D:  01/07/2011  T:  01/08/2011  Job:  [id number]  Electronically Signed by  Lindaann Slough M.D. on 01/29/2011 07:43:36 AM

## 2011-02-02 LAB — PROTIME-INR: INR: 2.2 — AB (ref <=1.1)

## 2011-02-03 ENCOUNTER — Ambulatory Visit (INDEPENDENT_AMBULATORY_CARE_PROVIDER_SITE_OTHER): Payer: Self-pay | Admitting: Cardiology

## 2011-02-03 DIAGNOSIS — R0989 Other specified symptoms and signs involving the circulatory and respiratory systems: Secondary | ICD-10-CM

## 2011-02-04 LAB — PROTIME-INR: INR: 1.4 — AB (ref 0.9–1.1)

## 2011-02-04 NOTE — Consult Note (Signed)
[name], [name]                 ACCOUNT NO.:  [id number]  MEDICAL RECORD NO.:  [id number]  LOCATION:  3311                         FACILITY:  [facility]  PHYSICIAN:  Hillis Range, MD       DATE OF BIRTH:  [birth year]  DATE OF CONSULTATION: DATE OF DISCHARGE:                                CONSULTATION   REQUESTING PHYSICIAN:  Melida Quitter, MD  REASON FOR CONSULTATION:  Supraventricular tachycardia and bradycardia.  HISTORY OF PRESENT ILLNESS:  [name] is a pleasant 59 year old gentleman who is well-known to me who is admitted with acute GI illness. The patient has a long-standing history of atrial arrhythmias.  He previously underwent cava tricuspid isthmus ablation by Dr. Graciela Husbands as well as slow pathway modification for AV nodal reentrant tachycardia. 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  He had inducible AV nodal reentrant tachycardia at that time, however, this was difficult to ablate due to intrinsic AV nodal disease and concerns for complete heart block.  He has had Mobitz I second-degree AV block chronically with occasional 2:1 AV conduction.  He has been felt to be a very poor candidate for pacemaker due to recurrent infections and end- stage renal disease.  I have been able to manage him quite well supportively in the past.  He has had a history of persistent atrial fibrillation for which he is chronically anticoagulated with Coumadin. He reports no recent episodes of arrhythmias prior to this admission. He denies any palpitations, dizziness, presyncope or syncope.  He has, however, initiate dialysis over the past few months.  He reports doing reasonably well with dialysis.  He also continues to struggle with osteomyelitis and underwent ostectomy of the right mid foot with debridement on June 2.  Most recently, he presented to [facility] with symptoms of loose  stools, vomiting and fevers.  He has been admitted to the Internal Medicine Service and found to have Salmonella bacteremia for which he has been treated with antibiotics. He also has scrotal cellulitis for which he is continues to receive antibiotics.  He has had diarrhea which has improved.  He is clinically dehydrated.  During his hospital stay, he has been observed on telemetry to have Mobitz I second-degree AV block as well as episodes of supraventricular tachycardia with a heart rates in the 130s.  He is completely asymptomatic with his supraventricular tachycardia and episodes tend to be a relatively short lived.  Cardiology is now consulted for further management.  Presently, he denies chest pain, palpitations, shortness of breath, or other concerns.  PAST MEDICAL HISTORY: 1. End-stage renal disease on dialysis. 2. Recurrent osteomyelitis, requiring debridement. 3. Hypertension. 4. GERD. 5. Hyperlipidemia. 6. Persistent atrial fibrillation chronically anticoagulated with     Coumadin. 7. Gout. 8. Status post cava tricuspid isthmus ablation by Dr. Graciela Husbands with slow     pathway modification. 9. Status post mitral annular reentrant flutter ablation by me with     repeat AV nodal modification which was limited due to intrinsic AV     nodal disease. 10.Mobitz I second-degree AV block chronically. 11.Diabetes. 12.Coronary artery  disease which is nonobstructive by cath in January     2010. 13.Gastroparesis. 14.Peripheral neuropathy. 15.Erectile dysfunction.  SOCIAL HISTORY:  The patient has a history of tobacco and smokes half pack per day.  He denies alcohol use.  FAMILY HISTORY:  His father died of an aneurysm.  His mother had glioblastoma and died 4 years ago.  REVIEW OF SYSTEMS:  All systems were reviewed and negative except as outlined in the HPI above.  MEDICATIONS:  Reviewed in the [facility].  ALLERGIES:  NO KNOWN DRUG ALLERGIES.  PHYSICAL EXAMINATION:  Telemetry  reveals sinus rhythm with Mobitz I second-degree AV block.  There are episodes of nonsustained supraventricular tachycardia documented. VITALS:  Blood pressure 125/54, heart rate 87, respirations 20, sats 97% on room air, afebrile. GENERAL:  The patient is a chronically ill appearing gentleman, in no acute distress.  He is alert and oriented x3. HEENT:  His eyes are sunken in compared to his baseline.  His oropharynx is dry. NECK:  Supple with flat JVP. LUNGS:  Clear. HEART:  Irregularly irregular rhythm.  No murmurs, rubs or gallops. GI:  Soft, nontender, nondistended.  Positive bowel sounds. EXTREMITIES:  No clubbing, cyanosis or edema.  He has a dressing in place over his right foot. SKIN:  No ecchymoses or lacerations. MUSCULOSKELETAL:  Diffuse muscle atrophy. PSYCH:  Euthymic mood.  Full affect.  EKGs on July 26th reveals supraventricular tachycardia, 130 beats per minute with a single PVC observed.  EKG from July 24th reveals atrial fibrillation with a controlled ventricular rate.  EKG from June 1st reveals sinus rhythm with a first-degree AV block.  Echocardiogram from July 10, 2009, reveals an ejection fraction of 45- 50% with mild left atrial dilatation and mild mitral regurgitation.  LABS:  Blood cultures revealed Salmonella bacteremia.  Potassium 2.8, BUN 63, creatinine 7.5, INR 3.7.  White blood cell count 13, hematocrit 32, platelets 118.  IMPRESSION:  [name] is a very pleasant 60 year old gentleman who is well-known to me due to his history of Mobitz I second-degree AV block as well as atrial arrhythmias.  He has done very well on the outpatient setting with medical management.  He has significant AV nodal conduction disease, however, he is a very poor candidate for pacemaker due to recurrent osteomyelitis and end-stage renal disease.  I have, therefore, avoided pacemaker implantation in the past and hence he has been asymptomatic with his Mobitz I  second-degree AV block.  He has had atrial arrhythmias in the past including atrial fibrillation, atrial flutter which has been ablated and also SVT.  His SVT has previously been AV nodal reentrant tachycardia.  More aggressive AV nodal modification has been limited due to his second-degree AV block and concerns for pacemaker long term.  As he has done very well prior to his recent infection, I think that we should continue our current medical regimen.  I would recommend that we continue Coumadin for stroke prevention.  I would strongly recommend that we avoid aggressive AV nodal blockade or medical therapies directed at supraventricular tachycardia.  I think that as his current medical illness resolves that his tachycardia will also likely resolve.  I will follow the patient closely with you during his hospital stay.  He appears clinically dehydrated on exam and I would therefore recommend that we not pull off significant fluid with dialysis.  In addition, I have encouraged aggressive oral hydration and he may even benefit from gentle IV hydration if he continues to have tachycardias.  Hillis Range, MD     JA/MEDQ  D:  01/09/2011  T:  01/09/2011  Job:  [id number]  Electronically Signed by Hillis Range MD on 02/04/2011 09:43:25 AM

## 2011-02-05 ENCOUNTER — Encounter: Payer: Self-pay | Admitting: Internal Medicine

## 2011-02-05 ENCOUNTER — Ambulatory Visit (INDEPENDENT_AMBULATORY_CARE_PROVIDER_SITE_OTHER): Payer: BC Managed Care – PPO | Admitting: Internal Medicine

## 2011-02-05 DIAGNOSIS — I5022 Chronic systolic (congestive) heart failure: Secondary | ICD-10-CM

## 2011-02-05 DIAGNOSIS — I4891 Unspecified atrial fibrillation: Secondary | ICD-10-CM

## 2011-02-05 DIAGNOSIS — I441 Atrioventricular block, second degree: Secondary | ICD-10-CM

## 2011-02-05 NOTE — Assessment & Plan Note (Signed)
Stable Volume much improved with dialysis

## 2011-02-05 NOTE — Assessment & Plan Note (Signed)
Stable Continue coumadin long term

## 2011-02-05 NOTE — Patient Instructions (Signed)
Your physician wants you to follow-up in: 6 months with Dr. Allred. You will receive a reminder letter in the mail two months in advance. If you don't receive a letter, please call our office to schedule the follow-up appointment.  

## 2011-02-05 NOTE — Assessment & Plan Note (Signed)
asymptomatic  Caution with av nodal agents  No further workup planned

## 2011-02-05 NOTE — Progress Notes (Signed)
The patient presents today for routine electrophysiology followup.  Since last being seen in our clinic, the patient reports doing reasonably well.  He was recently hospitalized for salmanella bacteremia.  He has done very well since discharge.  He remains active.  Today, he denies symptoms of palpitations, chest pain, shortness of breath, orthopnea, PND, lower extremity edema, dizziness, presyncope, syncope, or neurologic sequela.  The patient feels that he is tolerating medications without difficulties and is otherwise without complaint today.   Past Medical History  Diagnosis Date  . Chronic kidney disease     dialysis t,th,sat  . GERD (gastroesophageal reflux disease)   . Hypertension   . Neuromuscular disorder     neurophathy  . Atrial fibrillation     chronically anticoagulated with coumadin  . Hyperlipidemia   . Atrial flutter 07/07/2009    s/p ablation  . DIABETES MELLITUS, TYPE II 08/01/2008  . GOUT 08/01/2008  . ERECTILE DYSFUNCTION 08/01/2008  . PERIPHERAL NEUROPATHY 08/01/2008  . CORONARY ARTERY DISEASE 08/01/2008    non obstructive per cath 1/10; Dr. Juanda Chance  . SVT/ PSVT/ PAT 07/30/2009  . Gastroparesis 08/01/2008  . End stage renal failure on dialysis 08/01/2008  . NEPHROLITHIASIS, HX OF 08/01/2008  . Chronic systolic heart failure 06/21/2010  . COPD 07/21/2010  . Diverticulosis of colon (without mention of hemorrhage) 10/28/2010  . Benign neoplasm of colon 10/28/2010  . Hemorrhoids, internal 10/28/2010  . Renal cyst     bilateral  . Second degree Mobitz I AV block    Past Surgical History  Procedure Date  . Atrial ablation surgery 07-2008, 07/08/09    Dr Johney Frame  . Right foot 09-2009    infection  . Colonoscopy 21yrs ago    Dr.Orr  . Av fistula placement     left upper arm  . S/p right arm surgery after fracture   . Pilonidal cyst excision     s/p    Current Outpatient Prescriptions  Medication Sig Dispense Refill  . allopurinol (ZYLOPRIM) 100 MG tablet Take 1 tablet by  mouth daily.       Marland Kitchen atorvastatin (LIPITOR) 40 MG tablet Take 40 mg by mouth daily.       . B Complex-C-Folic Acid (DIALYVITE PO) Take 100 mg by mouth daily.        . carvedilol (COREG) 12.5 MG tablet Take 12.5 mg by mouth daily.        Marland Kitchen COUMADIN 5 MG tablet Take as directed by Anticoagulation clinic  30 tablet  1  . FOSRENOL 1000 MG chewable tablet Chew 1 tablet by mouth 3 (three) times daily with meals.       Marland Kitchen glucose blood (ONE TOUCH ULTRA TEST) test strip Use as directed two times a day  100 each  5  . HYDROcodone-acetaminophen (VICODIN) 5-500 MG per tablet Take 1 tablet by mouth every 8 (eight) hours as needed.        . insulin lispro (HUMALOG KWIKPEN) 100 UNIT/ML injection Inject 30 Units into the skin 3 (three) times daily before meals.       . Insulin Syringe-Needle U-100 (BD INSULIN SYRINGE ULTRAFINE) 31G X 5/16" 0.5 ML MISC 1cc use as directed       . loperamide (IMODIUM A-D) 2 MG tablet Take 2 mg by mouth Nightly.        Marland Kitchen omega-3 acid ethyl esters (LOVAZA) 1 G capsule Take 2 capsules (2 g total) by mouth daily.  180 capsule  2  . omeprazole (PRILOSEC)  20 MG capsule TAKE ONE CAPSULE AT BEDTIME  30 capsule  3  . TEMAZEPAM PO Take by mouth at bedtime.         Current Facility-Administered Medications  Medication Dose Route Frequency Provider Last Rate Last Dose  . DISCONTD: 0.9 %  sodium chloride infusion  500 mL Intravenous Continuous Sheryn Bison, MD        No Known Allergies  History   Social History  . Marital Status: Married    Spouse Name: N/A    Number of Children: 3  . Years of Education: N/A   Occupational History  . Sales Rep    Social History Main Topics  . Smoking status: Current Everyday Smoker -- 0.5 packs/day for 40 years    Types: Cigarettes  . Smokeless tobacco: Not on file  . Alcohol Use: No  . Drug Use: No  . Sexually Active:    Other Topics Concern  . 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[employer]    Family  History  Problem Relation Age of Onset  . Diabetes Mother   . Diabetes Maternal Grandmother   . Diabetes Paternal Grandmother   . Cancer Neg Hx     no FH of Colon Cancer    ROS-  All systems are reviewed and are negative except as outlined in the HPI above    Physical Exam: Filed Vitals:   02/05/11 1017  BP: 120/80  Pulse: 72  Height: 6\' 1"  (1.854 m)  Weight: 210 lb 12.8 oz (95.618 kg)    GEN- The patient is chronically ill appearing, alert and oriented x 3 today.   Head- normocephalic, atraumatic Eyes-  Sclera clear, conjunctiva pink Ears- hearing intact Oropharynx- clear Neck- supple, no JVP Lymph- no cervical lymphadenopathy Lungs- Clear to ausculation bilaterally, normal work of breathing Heart- Regular rate and rhythm, no murmurs, rubs or gallops, PMI not laterally displaced GI- soft, NT, ND, + BS Extremities- no clubbing, cyanosis, 1+ BLE edema MS- no significant deformity or atrophy Skin- foot dressing clean and dry  Assessment and Plan:

## 2011-02-08 ENCOUNTER — Telehealth: Payer: Self-pay | Admitting: *Deleted

## 2011-02-08 ENCOUNTER — Ambulatory Visit (INDEPENDENT_AMBULATORY_CARE_PROVIDER_SITE_OTHER): Payer: Self-pay | Admitting: Cardiology

## 2011-02-08 DIAGNOSIS — R0989 Other specified symptoms and signs involving the circulatory and respiratory systems: Secondary | ICD-10-CM

## 2011-02-08 NOTE — Telephone Encounter (Signed)
Per MD, pt is due for F/U OV. Appointment scheduled 02/12/2011 10:45am

## 2011-02-09 NOTE — Discharge Summary (Signed)
Perry, [name] NO.:  [id number]  MEDICAL RECORD NO.:  [id number]  LOCATION:  1610                         FACILITY:  [facility]  PHYSICIAN:  Ileana Roup, M.D.  DATE OF BIRTH:  07/29/1950  DATE OF ADMISSION:  01/05/2011 DATE OF DISCHARGE:  01/14/2011                              DISCHARGE SUMMARY   DISCHARGE DIAGNOSES: 1. Salmonella bacteremia/diarrhea. 2. Hypokalemia. 3. Scrotal cellulitis. 4. Atrial fibrillation. 5. Diabetes mellitus. 6. End-stage renal disease.  DISCHARGE MEDICATIONS:  1. Atorvastatin 80 mg tablet. 2. Fenofibrate 160 mg tablet daily. 3. Lantus 10 units subcutaneously daily. 4. Nystatin. 5. Triamcinolone ointment one application topically 3 times a day. 6. Paricalcitol 5 mg injection, 2 mcg intravenous. 7. Allopurinol 100 mg 2 tablets by mouth daily. 8. Carvedilol 12.5 mg at 8 a.m. and at 4 p.m. one tablet by mouth     twice daily. 9. Coumadin 5 mg special instructions is 2.5 mg on Monday, Wednesday,     and Friday and 5 mg on the other days of the week, so it's a dose     half a tablet by mouth daily. 10.Humalog insulin lispro 30 units subcutaneously twice daily before     meals. 11.Lovaza 1 g one capsule by mouth twice daily. 12.Methocarbamol 500 mg for spasms one tablet by mouth 3 times a day     as needed. 13.Omeprazole 20 mg one capsule by mouth daily at bedtime. 14.Renal vitamin one tablet by mouth daily. 15.Temazepam 30 mg one tablet by mouth daily at bedtime. 16.Vicodin 5/500 for pain one tablet by mouth every 4 hours as needed. 17.Amoxicillin 500 mg, take 1 pill daily on the evening by mouth daily.  DISPOSITION AND FOLLOWUP:  [name] was discharged from the hospital where he received treatment for diarrhea, salmonella bacteremia, scrotal cellulitis, atrial fibrillation, and hypokalemia.  At the time of discharge, the patient was completely stable that diarrhea and electrolytes abnormalities had improved.  No  fever.  No chills or rigors.  Erythema and pain of scrotal area had decreased.  Heart rate was stable.  The patient denied any chest pain, shortness of breath, dizziness, or palpitations.  Due to elevated sugar levels, Lantus was added to his insulin regimen and the patient was encouraged to follow up with his endocrinologist.  At the time of followup, he will need to be evaluated with a basic metabolic panel to monitor his potassium or any other electrolyte abnormalities.  Additionally, he will need a CBC to evaluate for any evidence of leukocytosis.  PROCEDURES:  There were no procedures during the hospital stay.  CONSULTATIONS: 1. Cardiology, Hillis Range, MD.  Reason for consult is     supraventricular tachycardia and bradycardia.  His recommendations     were to continue Coumadin for stroke prevention and avoid     aggressive AV nodal blockade or medical therapies directed at the     supraventricular tachycardia. 2. Urology, Lindaann Slough, MD.  Reason was swelling and pain up to     scrotum to rule out Fournier gangrene.  Recommendations were no     evidence of abscess or Fournier gangrene, apply Mycolog ointment  scrotum. 3. Infectious Disease consult with Dr. Cliffton Asters.  Reason, length     of treatment for salmonella bacteremia.  Recommendations were to     discontinue ciprofloxacin and start amoxicillin for a period of 7     days.  HISTORY OF PRESENT ILLNESS:  [name] is a pleasant 60 year old man with past medical history of hypertension, diabetes mellitus, end-stage renal disease, and paroxysmal atrial fib who presented to the ER department with diarrhea.  The diarrhea began 7 days prior to admission. He reported having greater than 5 loose watery dark stools.  No blood was present.  Associated symptoms including nausea and nonbloody vomitus.  He was unable to tolerate food or drinks.  His wife had similar symptoms before that resolved spontaneously after period  of 3 days.  He was on doxycycline 3 weeks prior to admission for a right foot plantar abscess.  He denied chest pain, shortness of breath, cough, fever, recent travel, chills, or abdominal pain.  EKG show atrial fibrillation, but patient denied palpitations.  PHYSICAL EXAMINATION:  VITAL SIGNS:  Blood pressure 128/68, heart rate 73, respiratory rate 16, and oxygen saturation 99% at room air. GENERAL:  The patient is an obese, Caucasian male, in no apparent distress. LUNGS:  Clear to auscultation. HEART:  Rate had irregularly irregular rhythm.  No murmurs.  No gallops. ABDOMEN:  Slightly distended.  Normal bowel sounds.  Tender to palpation on the right mid quadrant with rebound tenderness. BACK:  Had a small 3 cm cyst located in the lower spine.  Nontender to palpation. EXTREMITIES:  Right foot wrap in an Ace wrap, small amount of drain from the right foot wound.  ADMISSION LABS:  White blood cells of 13.1, hemoglobin 13.9, hematocrit 38.9, and platelets 254.  Sodium 137, potassium less than 2, chloride 95, calcium 8.8, glucose 221, BUN 73, creatinine 5.36, PT 14.9, and INR 1.15.  EKG showed evidence of  atrial fibrillation.  IMAGING: 1. Chest x-ray showed no active lung disease. 2. Abdomen 2 views show a few air fluid levels in the colon that can     be seen with diarrhea or enemas.  No bowel obstruction or free air.  HOSPITAL COURSE: 1. Diarrhea.  The patient presented with 7 days of  profuse watery     diarrhea.  Studies were Clostridium difficile were negative.     Studies for possible ova and parasites were suggested;     however, stool samples were never collected.  The patient received     7 days of empiric Flagyl therapy and fluid replacement with gentle     hydration due to the end-stage renal disease.  Diarrhea was under     control. and was felt to be due to salmonella that was growing in his     blood cultures, on 2 days after change in the patient to oral      antibiotics, he developed 3 episodes of diarrhea which subsided in     a period of 24 hours.  ID did not felt this was related to his     medications.  C. Difficile were never done due to lack of stool.     By the time of discharge, the patient's diarrhea had subsided.  May     have been a transient episode profile by amoxicillin or due to his     hyperglycemia.  2. Hypokalemia secondary to GI loss.  Initially repleted with     potassium p.o.  However due  to lack of improvement, potassium was     eventually repleted IV during hemodialysis.  At the time of     discharge, potassium levels were within normal limits. 3. Scrotal cellulitis.  The patient was found to have scrotal erythema     that progressed over a period of 12 hours.  He was started on     clindamycin.  Urology was consulted to rule out possible Fournier's     gangrene.  Per Urology consult, there was no evidence of abscess or     Fournier gangrene.  Clindamycin was discontinued, Mycolog ointment     was added.  At the time of discharge, the patient's erythema and     pain had started to resolve.  No fevers or chills. 4. Salmonella bacteremia on January 06, 2011.  The patient experienced     fevers and chills.  He was started on doxycycline, but was rapidly    changed vancomycin due to concerns of sepsis.  Blood cultures grew     gram negative rods.  Zosyn was added.  Final blood cultures grew     4/4 salmonella.  Zosyn and Flagyl were discontinued and ceftriaxone     was started per pharmacy recommendations.  The patient developed     thrombocytopenia, therefore vancomycin and Rocephin were     discontinued and ciprofloxacin was ordered.  ID was consulted for     concerns of lengths of treatment.  Per ID recommendation, Cipro was     changed to a 7-day course of amoxicillin 500 mg p.o. one dose     daily.  At the time of discharge, the patient was stable.  No     fevers, chills, or tachycardia. 5. Atrial fibrillation, currently  being managed by Dr. Hillis Range,     treatment of Coumadin and Coreg.  Coreg was adjusted due     to several episodes of sustained tachycardia.  Due to     symptoms of AV blockade and hypotension, he was eventually     restarted at a low dose and was progressively increased to 12.5,     again his arrhythmia seems to be under control at this dose. 6. Diabetes mellitus, not well controlled.  Elevated sugar levels most     likely due to his current illness was initially managed with an     intermediate insulin scale, which was later changed to a resistant     scale.  He was eventually started on basal insulin due to     persistent elevated sugar levels.  He was discharged with 30 units     of Humalog 2 times a day and 10 units of Lantus daily.  An     endocrinologist followup appointment was scheduled for January 18, 2011. 7. Thrombocytopenia, resolved after vancomycin was discontinued. 8. End-stage renal disease.  He is currently on dialysis and managed     by [facility].  DISCHARGE VITALS:  Temperature 98.5, systolic blood pressure 121, diastolic blood pressure 76, pulse 75, respiratory rate 18, and oxygen saturations 97% on room air.  DISCHARGE LABS:  White blood cells of 7.1, hemoglobin 9.8, hematocrit 29.6, and platelets 226.  Sodium 135, potassium 3.5, chloride 98, bicarb 27, BUN 29, creatinine 4.45, and glucose 349.    ______________________________ Elby Beck   ______________________________ Ileana Roup, M.D.    FA/MEDQ  D:  01/14/2011  T:  01/15/2011  Job:  [id number]  cc:   Gregary Signs  A. Everardo All, MD  Electronically Signed by Darnelle Maffucci  on 01/27/2011 05:13:10 PM Electronically Signed by Margarito Liner M.D. on 02/09/2011 07:24:27 PM

## 2011-02-12 ENCOUNTER — Encounter: Payer: Self-pay | Admitting: Endocrinology

## 2011-02-12 ENCOUNTER — Ambulatory Visit (INDEPENDENT_AMBULATORY_CARE_PROVIDER_SITE_OTHER): Payer: BC Managed Care – PPO | Admitting: Endocrinology

## 2011-02-12 DIAGNOSIS — E119 Type 2 diabetes mellitus without complications: Secondary | ICD-10-CM

## 2011-02-12 NOTE — Progress Notes (Signed)
Subjective:    Patient ID: [name], male    DOB: 09/23/1950, 60 y.o.   MRN: [id number]  HPI Pt takes humalog 30 units tid (qac).  no cbg record, but states cbg's vary from 150-225.  It is higher in am, and lower at other times of day, including at hs.   He has lost 21 lbs since last ov here, due to last ov here.  His right foot is much better, under the care of dr duda.   Past Medical History  Diagnosis Date  . Chronic kidney disease     dialysis t,th,sat  . GERD (gastroesophageal reflux disease)   . Hypertension   . Neuromuscular disorder     neurophathy  . Atrial fibrillation     chronically anticoagulated with coumadin  . Hyperlipidemia   . Atrial flutter 07/07/2009    s/p ablation  . DIABETES MELLITUS, TYPE II 08/01/2008  . GOUT 08/01/2008  . ERECTILE DYSFUNCTION 08/01/2008  . PERIPHERAL NEUROPATHY 08/01/2008  . CORONARY ARTERY DISEASE 08/01/2008    non obstructive per cath 1/10; Dr. Juanda Chance  . SVT/ PSVT/ PAT 07/30/2009  . Gastroparesis 08/01/2008  . End stage renal failure on dialysis 08/01/2008  . NEPHROLITHIASIS, HX OF 08/01/2008  . Chronic systolic heart failure 06/21/2010  . COPD 07/21/2010  . Diverticulosis of colon (without mention of hemorrhage) 10/28/2010  . Benign neoplasm of colon 10/28/2010  . Hemorrhoids, internal 10/28/2010  . Renal cyst     bilateral  . Second degree Mobitz I AV block     Past Surgical History  Procedure Date  . Atrial ablation surgery 07-2008, 07/08/09    Dr Johney Frame  . Right foot 09-2009    infection  . Colonoscopy 32yrs ago    Dr.Orr  . Av fistula placement     left upper arm  . S/p right arm surgery after fracture   . Pilonidal cyst excision     s/p    History   Social History  . Marital Status: Married    Spouse Name: N/A    Number of Children: 3  . Years of Education: N/A   Occupational History  . Sales Rep    Social History Main Topics  . Smoking status: Current Everyday Smoker -- 0.5 packs/day for 40 years    Types:  Cigarettes  . Smokeless tobacco: Not on file  . Alcohol Use: No  . Drug Use: No  . Sexually Active:    Other Topics Concern  . 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 allopurinol (ZYLOPRIM) 100 MG tablet Take 1 tablet by mouth daily.       Marland Kitchen atorvastatin (LIPITOR) 40 MG tablet Take 40 mg by mouth daily.       . B Complex-C-Folic Acid (DIALYVITE PO) Take 100 mg by mouth daily.        . carvedilol (COREG) 12.5 MG tablet Take 12.5 mg by mouth daily.        Marland Kitchen COUMADIN 5 MG tablet Take as directed by Anticoagulation clinic  30 tablet  1  . FOSRENOL 1000 MG chewable tablet Chew 1 tablet by mouth 3 (three) times daily with meals.       Marland Kitchen glucose blood (ONE TOUCH ULTRA TEST) test strip Use as directed two times a day  100 each  5  . HYDROcodone-acetaminophen (VICODIN) 5-500 MG  per tablet Take 1 tablet by mouth every 8 (eight) hours as needed.        . insulin lispro (HUMALOG KWIKPEN) 100 UNIT/ML injection Inject 30 Units into the skin 3 (three) times daily before meals.       . Insulin Syringe-Needle U-100 (BD INSULIN SYRINGE ULTRAFINE) 31G X 5/16" 0.5 ML MISC 1cc use as directed       . loperamide (IMODIUM A-D) 2 MG tablet Take 2 mg by mouth Nightly.        Marland Kitchen omega-3 acid ethyl esters (LOVAZA) 1 G capsule Take 2 capsules (2 g total) by mouth daily.  180 capsule  2  . omeprazole (PRILOSEC) 20 MG capsule TAKE ONE CAPSULE AT BEDTIME  30 capsule  3  . TEMAZEPAM PO Take by mouth at bedtime.          No Known Allergies  Family History  Problem Relation Age of Onset  . Diabetes Mother   . Diabetes Maternal Grandmother   . Diabetes Paternal Grandmother   . Cancer Neg Hx     no FH of Colon Cancer    BP 138/76  Pulse 100  Temp(Src) 99.1 F (37.3 C) (Oral)  Ht 6\' 1"  (1.854 m)  Wt 209 lb 6.4 oz (94.983 kg)  BMI 27.63 kg/m2  SpO2 97%  Review of Systems denies  hypoglycemia.      Objective:   Physical Exam VITAL SIGNS:  See vs page GENERAL: no distress SKIN: Insulin injection sites at the anterior abdomen are normal    Assessment & Plan:  Dm, needs increased rx

## 2011-02-12 NOTE — Patient Instructions (Addendum)
Continue humalog 30 units 3x a day (just before each meal, including bedtime snack).   Add lantus 10 units at bedtime.  Call if this doesn't improve morning blood sugar to below 150.  check your blood sugar 2 times a day.  vary the time of day when you check, between before the 3 meals, and at bedtime.  also check if you have symptoms of your blood sugar being too high or too low.  please keep a record of the readings and bring it to your next appointment here.  please call us sooner if you are having low blood sugar episodes. Please make a regular physical appointment in approx 3 months. good diet and exercise habits significanly improve the control of your diabetes.  please let me know if you wish to be referred to a dietician.  high blood sugar is very risky to your health.  you should see an eye doctor every year. controlling your blood pressure and cholesterol drastically reduces the damage diabetes does to your body.  this also applies to quitting smoking.  please discuss these with your doctor.  you should take an aspirin every day, unless you have been advised by a doctor not to.

## 2011-02-17 ENCOUNTER — Ambulatory Visit: Payer: BC Managed Care – PPO | Admitting: Internal Medicine

## 2011-02-18 ENCOUNTER — Ambulatory Visit (INDEPENDENT_AMBULATORY_CARE_PROVIDER_SITE_OTHER): Payer: Self-pay | Admitting: Cardiology

## 2011-02-18 DIAGNOSIS — R0989 Other specified symptoms and signs involving the circulatory and respiratory systems: Secondary | ICD-10-CM

## 2011-02-19 ENCOUNTER — Ambulatory Visit: Payer: BC Managed Care – PPO | Admitting: Endocrinology

## 2011-02-26 ENCOUNTER — Ambulatory Visit (INDEPENDENT_AMBULATORY_CARE_PROVIDER_SITE_OTHER): Payer: Self-pay | Admitting: Cardiovascular Disease

## 2011-02-26 DIAGNOSIS — R0989 Other specified symptoms and signs involving the circulatory and respiratory systems: Secondary | ICD-10-CM

## 2011-03-03 LAB — POCT I-STAT GLUCOSE
Glucose, Bld: 319 — ABNORMAL HIGH
Operator id: 103831

## 2011-03-09 LAB — PROTIME-INR: INR: 1.6 — AB (ref 0.9–1.1)

## 2011-03-11 ENCOUNTER — Ambulatory Visit (INDEPENDENT_AMBULATORY_CARE_PROVIDER_SITE_OTHER): Payer: Self-pay | Admitting: Cardiology

## 2011-03-11 LAB — PROTIME-INR: INR: 1.2 — AB (ref 0.9–1.1)

## 2011-03-15 ENCOUNTER — Encounter: Payer: Self-pay | Admitting: Internal Medicine

## 2011-03-15 NOTE — Progress Notes (Signed)
This encounter was created in error - please disregard.

## 2011-03-25 LAB — PROTIME-INR: INR: 4.4 — ABNORMAL HIGH

## 2011-03-26 LAB — CBC
HCT: 34 — ABNORMAL LOW
Hemoglobin: 11.5 — ABNORMAL LOW
Hemoglobin: 12.1 — ABNORMAL LOW
MCV: 86.2
Platelets: 405 — ABNORMAL HIGH
RBC: 3.7 — ABNORMAL LOW
RBC: 3.94 — ABNORMAL LOW
RBC: 4.07 — ABNORMAL LOW
RDW: 14.7 — ABNORMAL HIGH
WBC: 11.7 — ABNORMAL HIGH
WBC: 11.9 — ABNORMAL HIGH
WBC: 9.6

## 2011-03-26 LAB — BASIC METABOLIC PANEL
BUN: 26 — ABNORMAL HIGH
Chloride: 102
GFR calc Af Amer: 49 — ABNORMAL LOW
GFR calc non Af Amer: 40 — ABNORMAL LOW
GFR calc non Af Amer: 45 — ABNORMAL LOW
Glucose, Bld: 241 — ABNORMAL HIGH
Potassium: 4
Potassium: 4.8
Sodium: 136
Sodium: 136

## 2011-03-26 LAB — PROTIME-INR
INR: 0.9
INR: 1.1
Prothrombin Time: 12.3
Prothrombin Time: 14.9

## 2011-03-26 LAB — HEPARIN LEVEL (UNFRACTIONATED): Heparin Unfractionated: 0.26 — ABNORMAL LOW

## 2011-03-26 LAB — TSH: TSH: 0.634

## 2011-03-29 ENCOUNTER — Telehealth: Payer: Self-pay

## 2011-03-29 NOTE — Telephone Encounter (Signed)
Called [facility] to try and track down INR results due on pt on 03/23/11.  Dialysis center states pt did not show up for dialysis on 03/23/11, and it was not drawn.  Pt is scheduled to have INR drawn tomorrow on 03/30/11 if he shows up for [facility].  They will forward results to [place] once back.

## 2011-04-01 ENCOUNTER — Ambulatory Visit (INDEPENDENT_AMBULATORY_CARE_PROVIDER_SITE_OTHER): Payer: BC Managed Care – PPO | Admitting: General Surgery

## 2011-04-01 ENCOUNTER — Telehealth: Payer: Self-pay

## 2011-04-01 ENCOUNTER — Encounter (INDEPENDENT_AMBULATORY_CARE_PROVIDER_SITE_OTHER): Payer: Self-pay | Admitting: General Surgery

## 2011-04-01 VITALS — BP 148/78 | HR 60 | Temp 98.6°F | Resp 20 | Ht 73.0 in | Wt 199.1 lb

## 2011-04-01 DIAGNOSIS — L723 Sebaceous cyst: Secondary | ICD-10-CM

## 2011-04-01 LAB — PROTIME-INR: INR: 1.2 — AB (ref 0.9–1.1)

## 2011-04-01 MED ORDER — DOXYCYCLINE HYCLATE 50 MG PO CAPS: 100.0000 mg | ORAL_CAPSULE | Freq: Two times a day (BID) | ORAL | Status: AC

## 2011-04-01 NOTE — Progress Notes (Signed)
CC: Infected cyst  HPI:  60 year old Caucasian male referred by his primary care physician for evaluation of a probable infected back cyst. The patient states that he's had an infected sebaceous cyst in the past. This particular cyst has been there for about 5 years. However over the past 3-4 days it has increased in size and become swollen and tender. He denies any drainage or trauma to the area. He states that he's had a fever up to 100. He also states that he's had some milky urine and was treated for urinary tract infection one month ago. He is currently on Coumadin and he states that his INR was 2 when it was checked several days ago.  Past Medical History  Diagnosis Date  . Chronic kidney disease     dialysis t,th,sat  . GERD (gastroesophageal reflux disease)   . Hypertension   . Neuromuscular disorder     neurophathy  . Atrial fibrillation     chronically anticoagulated with coumadin  . Hyperlipidemia   . Atrial flutter 07/07/2009    s/p ablation  . DIABETES MELLITUS, TYPE II 08/01/2008  . GOUT 08/01/2008  . ERECTILE DYSFUNCTION 08/01/2008  . PERIPHERAL NEUROPATHY 08/01/2008  . CORONARY ARTERY DISEASE 08/01/2008    non obstructive per cath 1/10; Dr. Juanda Chance  . SVT/ PSVT/ PAT 07/30/2009  . Gastroparesis 08/01/2008  . End stage renal failure on dialysis 08/01/2008  . NEPHROLITHIASIS, HX OF 08/01/2008  . Chronic systolic heart failure 06/21/2010  . COPD 07/21/2010  . Diverticulosis of colon (without mention of hemorrhage) 10/28/2010  . Benign neoplasm of colon 10/28/2010  . Hemorrhoids, internal 10/28/2010  . Renal cyst     bilateral  . Second degree Mobitz I AV block   . Cyst     infected cyst on back   . Muscle spasm of back     back muscle spasms and pt is unable to lay flat    Past Surgical History  Procedure Date  . Atrial ablation surgery 07-2008, 07/08/09    Dr Johney Frame  . Right foot 09-2009    infection  . Colonoscopy 38yrs ago    Dr.Orr  . Av fistula placement     left  upper arm  . S/p right arm surgery after fracture   . Pilonidal cyst excision     s/p   No Known Allergies Current Outpatient Prescriptions  Medication Sig Dispense Refill  . allopurinol (ZYLOPRIM) 100 MG tablet Take 1 tablet by mouth daily.       Marland Kitchen atorvastatin (LIPITOR) 40 MG tablet Take 40 mg by mouth daily.       . B Complex-C-Folic Acid (DIALYVITE PO) Take 100 mg by mouth daily.        . carvedilol (COREG) 12.5 MG tablet Take 12.5 mg by mouth daily.        Marland Kitchen COUMADIN 5 MG tablet Take as directed by Anticoagulation clinic  30 tablet  1  . FOSRENOL 1000 MG chewable tablet Chew 1 tablet by mouth 3 (three) times daily with meals.       Marland Kitchen glucose blood (ONE TOUCH ULTRA TEST) test strip Use as directed two times a day  100 each  5  . HYDROcodone-acetaminophen (VICODIN) 5-500 MG per tablet Take 1 tablet by mouth every 8 (eight) hours as needed.        . insulin glargine (LANTUS) 100 UNIT/ML injection Inject 10 Units into the skin at bedtime.        . insulin lispro (  HUMALOG KWIKPEN) 100 UNIT/ML injection Inject 30 Units into the skin 3 (three) times daily before meals.       . Insulin Syringe-Needle U-100 (BD INSULIN SYRINGE ULTRAFINE) 31G X 5/16" 0.5 ML MISC 1cc use as directed       . loperamide (IMODIUM A-D) 2 MG tablet Take 2 mg by mouth Nightly.        Marland Kitchen omega-3 acid ethyl esters (LOVAZA) 1 G capsule Take 2 capsules (2 g total) by mouth daily.  180 capsule  2  . omeprazole (PRILOSEC) 20 MG capsule TAKE ONE CAPSULE AT BEDTIME  30 capsule  3  . TEMAZEPAM PO Take by mouth at bedtime.        Marland Kitchen doxycycline (VIBRAMYCIN) 50 MG capsule Take 2 capsules (100 mg total) by mouth 2 (two) times daily.  10 capsule  0   Family History  Problem Relation Age of Onset  . Diabetes Mother   . Diabetes Maternal Grandmother   . Diabetes Paternal Grandmother   . Cancer Neg Hx     no FH of Colon Cancer   History  Substance Use Topics  . Smoking status: Current Everyday Smoker -- 0.5 packs/day for 40  years    Types: Cigarettes  . Smokeless tobacco: Never Used  . Alcohol Use: No   ROS- 8 point ROS done. Negative except for above  PE: BP 148/78  Pulse 60  Temp 98.6 F (37 C)  Resp 20  Ht 6\' 1"  (1.854 m)  Wt 199 lb 2 oz (90.323 kg)  BMI 26.27 kg/m2  Well-developed well-nourished slightly disheveled Caucasian male in no apparent distress Pulmonary-lungs are clear Cardiac-regular rate and rhythm Psychiatric-alert oriented, appropriate Skin-in his right lower back just to the right of the spine there is a 4 x 3 cm cyst. There is some mild overlying cellulitis. It is tender, and there is fluctuance. There is also a little bit of bruising over the area.  Assessment and plan: 60 year old Caucasian male with multiple medical problems with an infected right lower back sebaceous cyst  We discussed several management options such as antibiotics alone versus incision and drainage. I recommended incision and drainage. We discussed the risk and benefits including but not limited to bleeding, worsening infection, recurrence, need for additional procedures.  The patient was given wound care instructions.  After obtaining verbal consent, the area was prepped with Betadine. 5 cc of 1% Xylocaine with epinephrine was injected in the area. I then made a 1 inch vertical incision in the midportion of the cyst. There was a large amount of purulent drainage with sebaceous material with a small amount of hematoma. I tried to drain as much of the sebaceous material as possible. The wound was packed with 1/4" inch iodoform gauze and then covered with 4 x 4's. The patient tolerated the procedure well.   He is instructed to keep the area covered tonight. I gave him prescription for doxycycline for 5 days. We discussed the potential interaction with Coumadin. I advised him to hold his Coumadin for the next 2 days. Followup in 2 weeks. I advised to followup with his PCP regarding his milky urine

## 2011-04-01 NOTE — Telephone Encounter (Signed)
Ov now 

## 2011-04-01 NOTE — Telephone Encounter (Signed)
Pt called stating he has an infected cyst on his back that is very painful. Pt is requesting ABX, does he need and appt?

## 2011-04-01 NOTE — Telephone Encounter (Signed)
Pt is going to [facility] this afternoon for infected cyst.

## 2011-04-01 NOTE — Patient Instructions (Addendum)
Do not take your coumadin for the next 2 days            Abscess Care After  An abscess (also called a boil or furuncle) is an infected area that contains a collection of pus. Signs and symptoms of an abscess include pain, tenderness, redness, or hardness, or you may feel a moveable soft area under your skin. An abscess can occur anywhere in the body. The infection may spread to surrounding tissues causing cellulitis. A cut (incision) by the surgeon was made over your abscess and the pus was drained out. Gauze may have been packed into the space to provide a drain that will allow the cavity to heal from the inside outwards. The boil may be painful for 5 to 7 days. Most people with a boil do not have high fevers. Your abscess, if seen early, may not have localized, and may not have been lanced. If not, another appointment may be required for this if it does not get better on its own or with medications. HOME CARE INSTRUCTIONS   Only take over-the-counter or prescription medicines for pain, discomfort, or fever as directed by your caregiver.   When you bathe in the morning, soak and then remove gauze or iodoform packs. You may then wash the wound gently with mild soapy water. Cover the area with gauze or do as your caregiver directs.  SEEK IMMEDIATE MEDICAL CARE IF:   You develop increased pain, swelling, redness, drainage, or bleeding in the wound site.   You develop signs of generalized infection including muscle aches, chills, fever, or a general ill feeling.   An oral temperature above 102 F (38.9 C) develops, not controlled by medication.  See your caregiver for a recheck if you develop any of the symptoms described above. If medications (antibiotics) were prescribed, take them as directed. Document Released: 12/17/2004 Document Revised: 02/10/2011 Document Reviewed: 08/14/2007 [facility] Patient Information 2012 [place], [place].

## 2011-04-05 ENCOUNTER — Other Ambulatory Visit: Payer: Self-pay | Admitting: Internal Medicine

## 2011-04-05 ENCOUNTER — Ambulatory Visit (INDEPENDENT_AMBULATORY_CARE_PROVIDER_SITE_OTHER): Payer: Self-pay | Admitting: Internal Medicine

## 2011-04-05 DIAGNOSIS — R0989 Other specified symptoms and signs involving the circulatory and respiratory systems: Secondary | ICD-10-CM

## 2011-04-05 DIAGNOSIS — I4891 Unspecified atrial fibrillation: Secondary | ICD-10-CM

## 2011-04-05 DIAGNOSIS — Z7901 Long term (current) use of anticoagulants: Secondary | ICD-10-CM

## 2011-04-08 ENCOUNTER — Encounter: Payer: Self-pay | Admitting: Internal Medicine

## 2011-04-08 LAB — PROTIME-INR

## 2011-04-16 ENCOUNTER — Ambulatory Visit (INDEPENDENT_AMBULATORY_CARE_PROVIDER_SITE_OTHER): Payer: Self-pay | Admitting: Cardiovascular Disease

## 2011-04-16 DIAGNOSIS — I4891 Unspecified atrial fibrillation: Secondary | ICD-10-CM

## 2011-04-16 DIAGNOSIS — Z7901 Long term (current) use of anticoagulants: Secondary | ICD-10-CM

## 2011-04-21 ENCOUNTER — Ambulatory Visit (INDEPENDENT_AMBULATORY_CARE_PROVIDER_SITE_OTHER): Payer: BC Managed Care – PPO | Admitting: General Surgery

## 2011-04-21 ENCOUNTER — Encounter (INDEPENDENT_AMBULATORY_CARE_PROVIDER_SITE_OTHER): Payer: Self-pay | Admitting: General Surgery

## 2011-04-21 VITALS — BP 118/88 | HR 64 | Temp 98.0°F | Resp 20 | Ht 73.0 in | Wt 199.5 lb

## 2011-04-21 DIAGNOSIS — Z09 Encounter for follow-up examination after completed treatment for conditions other than malignant neoplasm: Secondary | ICD-10-CM

## 2011-04-21 NOTE — Progress Notes (Signed)
Chief complaint: Here for recheck  Procedure: Status post incision and drainage of right lower back infected sebaceous cyst in the office on October 18  History of Present Ilness: 60 year old Caucasian male with multiple medical problems and comes in for followup for the above mentioned problem. He denies any fevers or chills. He denies any ongoing drainage. He states that he is in his usual health. He states that he finished his antibiotic. However he states he may be placed on a new antibiotic because of a dirty urine sample.  Physical Exam: BP 118/88  Pulse 64  Temp(Src) 98 F (36.7 C) (Temporal)  Resp 20  Ht 6\' 1"  (1.854 m)  Wt 199 lb 8 oz (90.493 kg)  BMI 26.32 kg/m2  Caucasian male in no apparent distress using a cane to ambulate Psychiatric-alert oriented, appropriate Skin-right lower back incision slightly to the right of the spine. No cellulitis. No induration. Skin edges are fairly well approximated. However there is some fluid still underneath the incision.  I pryed open the skin incision with a hemostat. There is some drainage of sebaceous cyst material. I packed it with a small strip of quarter inch iodoform gauze and covered it with 4 x 4's and tape.   Assessment and Plan: Status post incision and drainage of a right lower back infected sebaceous cyst.  There is no current signs of infection. He just had some additional undrained cystic material. He was given wound care instructions. I do not think he needs antibiotics for this. I'll see him in 10-12 days for followup.

## 2011-04-21 NOTE — Patient Instructions (Signed)
Change outer bandage daily & as needed.  Please remove the narrow gauze strip from the wound on Friday.   Call for Temp>101.5, return of skin redness or worsening pain in that area, or if you have questions

## 2011-04-28 ENCOUNTER — Other Ambulatory Visit: Payer: Self-pay | Admitting: *Deleted

## 2011-04-28 MED ORDER — OMEPRAZOLE 20 MG PO CPDR: DELAYED_RELEASE_CAPSULE | ORAL | Status: DC

## 2011-04-28 NOTE — Telephone Encounter (Signed)
R'cd fax from [facility] for refill of Omeprazole  Last OV-02/12/2011

## 2011-04-29 LAB — PROTIME-INR: INR: 1.4 — AB (ref 0.9–1.1)

## 2011-04-30 ENCOUNTER — Encounter: Payer: Self-pay | Admitting: Endocrinology

## 2011-04-30 ENCOUNTER — Ambulatory Visit (INDEPENDENT_AMBULATORY_CARE_PROVIDER_SITE_OTHER): Payer: BC Managed Care – PPO | Admitting: Endocrinology

## 2011-04-30 ENCOUNTER — Other Ambulatory Visit (INDEPENDENT_AMBULATORY_CARE_PROVIDER_SITE_OTHER): Payer: BC Managed Care – PPO

## 2011-04-30 DIAGNOSIS — G609 Hereditary and idiopathic neuropathy, unspecified: Secondary | ICD-10-CM

## 2011-04-30 DIAGNOSIS — Z79899 Other long term (current) drug therapy: Secondary | ICD-10-CM

## 2011-04-30 DIAGNOSIS — Z125 Encounter for screening for malignant neoplasm of prostate: Secondary | ICD-10-CM

## 2011-04-30 DIAGNOSIS — E119 Type 2 diabetes mellitus without complications: Secondary | ICD-10-CM

## 2011-04-30 DIAGNOSIS — I2510 Atherosclerotic heart disease of native coronary artery without angina pectoris: Secondary | ICD-10-CM

## 2011-04-30 DIAGNOSIS — F528 Other sexual dysfunction not due to a substance or known physiological condition: Secondary | ICD-10-CM

## 2011-04-30 DIAGNOSIS — E785 Hyperlipidemia, unspecified: Secondary | ICD-10-CM

## 2011-04-30 DIAGNOSIS — Z0000 Encounter for general adult medical examination without abnormal findings: Secondary | ICD-10-CM

## 2011-04-30 DIAGNOSIS — R3129 Other microscopic hematuria: Secondary | ICD-10-CM

## 2011-04-30 DIAGNOSIS — M109 Gout, unspecified: Secondary | ICD-10-CM

## 2011-04-30 DIAGNOSIS — N259 Disorder resulting from impaired renal tubular function, unspecified: Secondary | ICD-10-CM

## 2011-04-30 DIAGNOSIS — I10 Essential (primary) hypertension: Secondary | ICD-10-CM

## 2011-04-30 LAB — LIPID PANEL
HDL: 37.6 mg/dL — ABNORMAL LOW (ref >39.00)
Total CHOL/HDL Ratio: 2

## 2011-04-30 LAB — HEMOGLOBIN A1C: Hgb A1c MFr Bld: 6.7 % — ABNORMAL HIGH (ref 4.6–6.5)

## 2011-04-30 LAB — PSA: PSA: 1.82 ng/mL (ref 0.10–4.00)

## 2011-04-30 LAB — HEPATIC FUNCTION PANEL
AST: 16 U/L (ref 0–37)
Bilirubin, Direct: 0.1 mg/dL (ref 0.0–0.3)
Total Bilirubin: 0.5 mg/dL (ref 0.3–1.2)

## 2011-04-30 LAB — VITAMIN B12: Vitamin B-12: 430 pg/mL (ref 211–911)

## 2011-04-30 MED ORDER — INSULIN ASPART 100 UNIT/ML ~~LOC~~ SOLN: 30.0000 [IU] | Freq: Three times a day (TID) | SUBCUTANEOUS | Status: DC

## 2011-04-30 NOTE — Progress Notes (Signed)
Subjective:    Patient ID: [name], male    DOB: 1950/08/06, 60 y.o.   MRN: [id number]  HPI here for regular wellness examination.  He's feeling pretty well in general, and says chronic med probs are stable, except as noted below. Past Medical History  Diagnosis Date  . Chronic kidney disease     dialysis t,th,sat  . GERD (gastroesophageal reflux disease)   . Hypertension   . Neuromuscular disorder     neurophathy  . Atrial fibrillation     chronically anticoagulated with coumadin  . Hyperlipidemia   . Atrial flutter 07/07/2009    s/p ablation  . DIABETES MELLITUS, TYPE II 08/01/2008  . GOUT 08/01/2008  . ERECTILE DYSFUNCTION 08/01/2008  . PERIPHERAL NEUROPATHY 08/01/2008  . CORONARY ARTERY DISEASE 08/01/2008    non obstructive per cath 1/10; Dr. Juanda Chance  . SVT/ PSVT/ PAT 07/30/2009  . Gastroparesis 08/01/2008  . End stage renal failure on dialysis 08/01/2008  . NEPHROLITHIASIS, HX OF 08/01/2008  . Chronic systolic heart failure 06/21/2010  . COPD 07/21/2010  . Diverticulosis of colon (without mention of hemorrhage) 10/28/2010  . Benign neoplasm of colon 10/28/2010  . Hemorrhoids, internal 10/28/2010  . Renal cyst     bilateral  . Second degree Mobitz I AV block   . Cyst     infected cyst on back   . Muscle spasm of back     back muscle spasms and pt is unable to lay flat     Past Surgical History  Procedure Date  . Atrial ablation surgery 07-2008, 07/08/09    Dr Johney Frame  . Right foot 09-2009    infection  . Colonoscopy 48yrs ago    Dr.Orr  . Av fistula placement     left upper arm  . S/p right arm surgery after fracture   . Pilonidal cyst excision     s/p    History   Social History  . Marital Status: Married    Spouse Name: N/A    Number of Children: 3  . Years of Education: N/A   Occupational History  . Sales Rep    Social History Main Topics  . Smoking status: Current Everyday Smoker -- 0.5 packs/day for 40 years    Types: Cigarettes  . Smokeless tobacco:  Never Used  . Alcohol Use: No  . Drug Use: No  . Sexually Active:    Other Topics Concern  . 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 allopurinol (ZYLOPRIM) 100 MG tablet Take 1 tablet by mouth daily.       Marland Kitchen atorvastatin (LIPITOR) 40 MG tablet Take 40 mg by mouth daily.       . B Complex-C-Folic Acid (DIALYVITE PO) Take 100 mg by mouth daily.        . carvedilol (COREG) 12.5 MG tablet Take 12.5 mg by mouth daily.        Marland Kitchen COUMADIN 5 MG tablet TAKE AS DIRECTED BY ANTICOAGULATION     CLINIC  30 each  3  . FOSRENOL 1000 MG chewable tablet Chew 1 tablet by mouth 3 (three) times daily with meals.       Marland Kitchen glucose blood (ONE TOUCH ULTRA TEST) test strip Use as directed two times a day  100 each  5  . HYDROcodone-acetaminophen (VICODIN) 5-500 MG per tablet Take 1 tablet  by mouth every 8 (eight) hours as needed.        . insulin glargine (LANTUS) 100 UNIT/ML injection Inject 10 Units into the skin at bedtime.        . insulin lispro (HUMALOG KWIKPEN) 100 UNIT/ML injection Inject 30 Units into the skin 3 (three) times daily before meals.       . Insulin Syringe-Needle U-100 (BD INSULIN SYRINGE ULTRAFINE) 31G X 5/16" 0.5 ML MISC 1cc use as directed       . loperamide (IMODIUM A-D) 2 MG tablet Take 2 mg by mouth Nightly.        Marland Kitchen omega-3 acid ethyl esters (LOVAZA) 1 G capsule Take 2 capsules (2 g total) by mouth daily.  180 capsule  2  . omeprazole (PRILOSEC) 20 MG capsule TAKE ONE CAPSULE AT BEDTIME  30 capsule  5  . TEMAZEPAM PO Take by mouth at bedtime.          No Known Allergies  Family History  Problem Relation Age of Onset  . Diabetes Mother   . Diabetes Maternal Grandmother   . Diabetes Paternal Grandmother   . Cancer Neg Hx     no FH of Colon Cancer    BP 112/70  Pulse 94  Temp(Src) 98.1 F (36.7 C) (Oral)  Ht 6\' 1"  (1.854 m)  Wt 198 lb 1.9 oz  (89.867 kg)  BMI 26.14 kg/m2  SpO2 98%   Review of Systems  Constitutional: Negative for fever.       Pt says he has lost weight, due to his efforts  HENT: Negative for hearing loss.   Eyes: Negative for visual disturbance.  Respiratory: Negative for shortness of breath.   Cardiovascular: Negative for chest pain.  Gastrointestinal: Negative for anal bleeding.  Genitourinary:       Mildly decreased urinary stream  Musculoskeletal: Positive for back pain.  Skin: Negative for rash.  Neurological: Negative for syncope.  Hematological: Bruises/bleeds easily.  Psychiatric/Behavioral: Positive for dysphoric mood. Negative for sleep disturbance.      Objective:   Physical Exam VS: see vs page GEN: no distress HEAD: head: no deformity eyes: no periorbital swelling, no proptosis external nose and ears are normal mouth: no lesion seen NECK: supple, thyroid is not enlarged CHEST WALL: no deformity LUNGS: clear to auscultation BREASTS:  No gynecomastia CV: reg rate and rhythm.   There is a soft systolic murmur. ABD: abdomen is soft, nontender.  no hepatosplenomegaly.  not distended.  no hernia GENITALIA:  Normal male, except for moderate scrotal edema. RECTAL: normal external and internal exam.  heme neg. PROSTATE:  Normal size.  No nodule MUSCULOSKELETAL: muscle bulk and strength are grossly normal.  no obvious joint swelling.  gait is steady, with a cane.   PULSES: dorsalis pedis intact bilat.  no carotid bruit NEURO:  cn 2-12 grossly intact.   readily moves all 4's.  SKIN:  Normal texture and temperature.  No rash or suspicious lesion is visible.   NODES:  None palpable at the neck PSYCH: alert, oriented x3.  Does not appear anxious nor depressed.     Assessment & Plan:  Wellness visit today, with problems stable, except as noted.   SEPARATE EVALUATION FOLLOWS--EACH PROBLEM HERE IS NEW, NOT RESPONDING TO TREATMENT, OR POSES SIGNIFICANT RISK TO THE PATIENT'S HEALTH: HISTORY  OF THE PRESENT ILLNESS: Pt states few years of severe numbness of the legs and feet, but no assoc pain.  no cbg record, but states cbg's are 100-200.  It is in general highest in am.   PAST MEDICAL HISTORY reviewed and up to date today REVIEW OF SYSTEMS: denies hypoglycemia PHYSICAL EXAMINATION: Pulses: dorsalis pedis intact bilat.   Feet: healed ulcer at the right foot.  No deformity.  Normal temp and color.  Neuro: sensation is intact to touch on the feet, but severely decreased from normal VITAL SIGNS:  See vs page GENERAL: no distress IMPRESSION: DM, prob needs increased rx Numbness, prob due to DM.  He is at risk for recurrent ulcers PLAN: See instruction page

## 2011-04-30 NOTE — Patient Instructions (Addendum)
Continue humalog 30 units 3x a day (just before each meal, including bedtime snack).   increase lantus to 20 units at bedtime.  Call if this doesn't improve morning blood sugar to below 150.  check your blood sugar 2 times a day.  vary the time of day when you check, between before the 3 meals, and at bedtime.  also check if you have symptoms of your blood sugar being too high or too low.  please keep a record of the readings and bring it to your next appointment here.  please call us sooner if you are having low blood sugar episodes.  Please make a regular physical appointment in approx 3 months.  please consider these measures for your health:  minimize alcohol.  do not use tobacco products.  have a colonoscopy at least every 10 years from age 34.  keep firearms safely stored.  always use seat belts.  have working smoke alarms in your home.  see an eye doctor and dentist regularly.  never drive under the influence of alcohol or drugs (including prescription drugs).  those with fair skin should take precautions against the sun. please let me know what your wishes would be, if artificial life support measures should become necessary.  it is critically important to prevent falling down (keep floor areas well-lit, dry, and free of loose objects.  If you have a cane, walker, or wheelchair, you should use it, even for short trips around the house.  Also, try not to rush).   blood tests are being requested for you today.  please call [phone] to hear your test results.  You will be prompted to enter the 9-digit "MRN" number that appears at the top left of this page, followed by #.  Then you will hear the message.

## 2011-05-03 ENCOUNTER — Ambulatory Visit (INDEPENDENT_AMBULATORY_CARE_PROVIDER_SITE_OTHER): Payer: Self-pay | Admitting: Cardiology

## 2011-05-03 ENCOUNTER — Ambulatory Visit: Payer: BC Managed Care – PPO

## 2011-05-03 DIAGNOSIS — Z7901 Long term (current) use of anticoagulants: Secondary | ICD-10-CM

## 2011-05-03 DIAGNOSIS — F528 Other sexual dysfunction not due to a substance or known physiological condition: Secondary | ICD-10-CM

## 2011-05-03 DIAGNOSIS — I4891 Unspecified atrial fibrillation: Secondary | ICD-10-CM

## 2011-05-03 DIAGNOSIS — R0989 Other specified symptoms and signs involving the circulatory and respiratory systems: Secondary | ICD-10-CM

## 2011-05-04 LAB — TESTOSTERONE, FREE, TOTAL, SHBG
Testosterone-% Free: 1.8 % (ref 1.6–2.9)
Testosterone: 161.99 ng/dL — ABNORMAL LOW (ref 250–890)

## 2011-05-05 ENCOUNTER — Other Ambulatory Visit: Payer: Self-pay | Admitting: Endocrinology

## 2011-05-05 DIAGNOSIS — E291 Testicular hypofunction: Secondary | ICD-10-CM

## 2011-05-10 ENCOUNTER — Encounter (INDEPENDENT_AMBULATORY_CARE_PROVIDER_SITE_OTHER): Payer: Self-pay | Admitting: General Surgery

## 2011-05-10 ENCOUNTER — Ambulatory Visit (INDEPENDENT_AMBULATORY_CARE_PROVIDER_SITE_OTHER): Payer: BC Managed Care – PPO | Admitting: General Surgery

## 2011-05-10 VITALS — BP 154/82 | HR 76 | Temp 97.2°F | Resp 16 | Ht 73.0 in | Wt 198.0 lb

## 2011-05-10 DIAGNOSIS — L03319 Cellulitis of trunk, unspecified: Secondary | ICD-10-CM

## 2011-05-10 DIAGNOSIS — L02219 Cutaneous abscess of trunk, unspecified: Secondary | ICD-10-CM

## 2011-05-10 DIAGNOSIS — L02212 Cutaneous abscess of back [any part, except buttock]: Secondary | ICD-10-CM

## 2011-05-10 NOTE — Progress Notes (Signed)
Subjective:     Patient ID: [name], male   DOB: [birth year], 60 y.o.   MRN: [id number]  HPI  And the patient follows up today for evaluation of his lower back abscess. He's had this previously opened and drained twice before. He's been doing wound packing but the wound is recently healed over and over the last few days he has had increased swelling in the area with some increased tenderness and some purulent drainage. Denies any fevers or chills. Review of Systems     Objective:   Physical Exam No distress and nontoxic-appearing  In the lower back just to the right of the midline he has a 4 cm x 4 cm area of fluctuance and tenderness with some maroonish drainage consistent with infected hematoma.    Assessment:     Recurrent back abscess. This appears to be infected again and I would recommend incision and drainage today.    Plan:     After informed consent was obtained the area was prepped with Betadine and anesthetized with 1% lidocaine with epinephrine. A circular incision was made over the fluctuant area and purulent material was expressed. At the base of the wound he had a cyst capsule and I tried to debride as much of the cyst capsule as possible. The wound was packed tightly for hemostasis and pressure dressing was applied he will followup tomorrow for repeat wound packing and otherwise we'll plan for twice daily dressing changes.

## 2011-05-12 ENCOUNTER — Encounter (INDEPENDENT_AMBULATORY_CARE_PROVIDER_SITE_OTHER): Payer: Self-pay | Admitting: General Surgery

## 2011-05-12 ENCOUNTER — Ambulatory Visit (INDEPENDENT_AMBULATORY_CARE_PROVIDER_SITE_OTHER): Payer: BC Managed Care – PPO | Admitting: General Surgery

## 2011-05-12 VITALS — BP 122/86 | HR 92 | Temp 98.6°F | Resp 18 | Ht 73.0 in | Wt 192.4 lb

## 2011-05-12 DIAGNOSIS — L089 Local infection of the skin and subcutaneous tissue, unspecified: Secondary | ICD-10-CM

## 2011-05-12 DIAGNOSIS — L723 Sebaceous cyst: Secondary | ICD-10-CM

## 2011-05-12 NOTE — Progress Notes (Signed)
Subjective:     Patient ID: [name], male   DOB: 1950-12-29, 60 y.o.   MRN: [id number]  HPI  The patient returns for wound check and repacking status post incision and drainage of infected sebaceous cyst on his back. He states he feels much better and the pain has been improving. He has not done any wound care since his last visit 2 days ago. Review of Systems     Objective:   Physical Exam The erythema is much improved and the packing was changed and he is started developing some nice granulation tissue at the base. The wound was redressed.    Assessment:     Status post infected sebaceous cyst-doing well    Plan:     The wound is already looking much better and improving. I recommended that he return tomorrow with a friend that can pack the wound so we can teach them with her wound care. He will need to have this packed daily and again explained to him so he does not have recurrent infection.

## 2011-05-17 ENCOUNTER — Ambulatory Visit (INDEPENDENT_AMBULATORY_CARE_PROVIDER_SITE_OTHER): Payer: Self-pay | Admitting: Internal Medicine

## 2011-05-17 DIAGNOSIS — Z7901 Long term (current) use of anticoagulants: Secondary | ICD-10-CM

## 2011-05-17 DIAGNOSIS — I4891 Unspecified atrial fibrillation: Secondary | ICD-10-CM

## 2011-05-17 DIAGNOSIS — R0989 Other specified symptoms and signs involving the circulatory and respiratory systems: Secondary | ICD-10-CM

## 2011-05-26 ENCOUNTER — Encounter (INDEPENDENT_AMBULATORY_CARE_PROVIDER_SITE_OTHER): Payer: BC Managed Care – PPO | Admitting: General Surgery

## 2011-05-31 ENCOUNTER — Ambulatory Visit (INDEPENDENT_AMBULATORY_CARE_PROVIDER_SITE_OTHER): Payer: Self-pay | Admitting: Cardiology

## 2011-05-31 DIAGNOSIS — Z7901 Long term (current) use of anticoagulants: Secondary | ICD-10-CM

## 2011-05-31 DIAGNOSIS — I4891 Unspecified atrial fibrillation: Secondary | ICD-10-CM

## 2011-05-31 DIAGNOSIS — R0989 Other specified symptoms and signs involving the circulatory and respiratory systems: Secondary | ICD-10-CM

## 2011-05-31 NOTE — Patient Instructions (Signed)
Safety teaching with patient due to not therapeutic

## 2011-06-03 ENCOUNTER — Encounter (INDEPENDENT_AMBULATORY_CARE_PROVIDER_SITE_OTHER): Payer: BC Managed Care – PPO | Admitting: General Surgery

## 2011-06-11 ENCOUNTER — Ambulatory Visit (INDEPENDENT_AMBULATORY_CARE_PROVIDER_SITE_OTHER): Payer: Self-pay | Admitting: Cardiology

## 2011-06-11 DIAGNOSIS — Z7901 Long term (current) use of anticoagulants: Secondary | ICD-10-CM

## 2011-06-11 DIAGNOSIS — R0989 Other specified symptoms and signs involving the circulatory and respiratory systems: Secondary | ICD-10-CM

## 2011-06-11 DIAGNOSIS — I4891 Unspecified atrial fibrillation: Secondary | ICD-10-CM

## 2011-06-22 LAB — PROTIME-INR: INR: 1.4 — AB (ref 0.9–1.1)

## 2011-06-23 ENCOUNTER — Encounter (INDEPENDENT_AMBULATORY_CARE_PROVIDER_SITE_OTHER): Payer: BC Managed Care – PPO | Admitting: General Surgery

## 2011-06-24 ENCOUNTER — Ambulatory Visit (INDEPENDENT_AMBULATORY_CARE_PROVIDER_SITE_OTHER): Payer: Self-pay | Admitting: Cardiology

## 2011-06-24 DIAGNOSIS — I4891 Unspecified atrial fibrillation: Secondary | ICD-10-CM

## 2011-06-24 DIAGNOSIS — Z7901 Long term (current) use of anticoagulants: Secondary | ICD-10-CM

## 2011-06-24 DIAGNOSIS — R0989 Other specified symptoms and signs involving the circulatory and respiratory systems: Secondary | ICD-10-CM

## 2011-07-08 ENCOUNTER — Encounter: Payer: Self-pay | Admitting: Cardiovascular Disease

## 2011-07-08 LAB — PROTIME-INR: INR: 2.1 — AB (ref <=1.1)

## 2011-07-14 ENCOUNTER — Ambulatory Visit: Payer: Self-pay | Admitting: Cardiovascular Disease

## 2011-07-14 DIAGNOSIS — I4891 Unspecified atrial fibrillation: Secondary | ICD-10-CM

## 2011-07-14 DIAGNOSIS — Z7901 Long term (current) use of anticoagulants: Secondary | ICD-10-CM

## 2011-07-19 ENCOUNTER — Other Ambulatory Visit: Payer: Self-pay

## 2011-07-19 ENCOUNTER — Encounter (INDEPENDENT_AMBULATORY_CARE_PROVIDER_SITE_OTHER): Payer: BC Managed Care – PPO | Admitting: Ophthalmology

## 2011-07-19 DIAGNOSIS — H3530 Unspecified macular degeneration: Secondary | ICD-10-CM

## 2011-07-19 DIAGNOSIS — E11319 Type 2 diabetes mellitus with unspecified diabetic retinopathy without macular edema: Secondary | ICD-10-CM

## 2011-07-19 DIAGNOSIS — H3581 Retinal edema: Secondary | ICD-10-CM

## 2011-07-19 DIAGNOSIS — H43819 Vitreous degeneration, unspecified eye: Secondary | ICD-10-CM

## 2011-07-19 DIAGNOSIS — E1039 Type 1 diabetes mellitus with other diabetic ophthalmic complication: Secondary | ICD-10-CM

## 2011-07-19 DIAGNOSIS — H2510 Age-related nuclear cataract, unspecified eye: Secondary | ICD-10-CM

## 2011-07-22 MED ORDER — ATORVASTATIN CALCIUM 40 MG PO TABS: 40.0000 mg | ORAL_TABLET | Freq: Every day | ORAL | Status: DC

## 2011-07-28 LAB — POCT INR: INR: 2

## 2011-07-29 ENCOUNTER — Ambulatory Visit: Payer: Self-pay | Admitting: Cardiovascular Disease

## 2011-07-29 DIAGNOSIS — Z7901 Long term (current) use of anticoagulants: Secondary | ICD-10-CM

## 2011-07-29 DIAGNOSIS — I4891 Unspecified atrial fibrillation: Secondary | ICD-10-CM

## 2011-08-04 ENCOUNTER — Ambulatory Visit (INDEPENDENT_AMBULATORY_CARE_PROVIDER_SITE_OTHER): Payer: BC Managed Care – PPO | Admitting: Ophthalmology

## 2011-08-04 ENCOUNTER — Other Ambulatory Visit: Payer: Self-pay | Admitting: Cardiology

## 2011-08-04 ENCOUNTER — Ambulatory Visit: Payer: BC Managed Care – PPO | Admitting: Endocrinology

## 2011-08-04 DIAGNOSIS — Z0289 Encounter for other administrative examinations: Secondary | ICD-10-CM

## 2011-08-04 MED ORDER — COUMADIN 5 MG PO TABS: 5.0000 mg | ORAL_TABLET | ORAL | Status: DC

## 2011-08-09 ENCOUNTER — Ambulatory Visit (INDEPENDENT_AMBULATORY_CARE_PROVIDER_SITE_OTHER): Payer: Medicare Other | Admitting: Ophthalmology

## 2011-08-09 DIAGNOSIS — H3581 Retinal edema: Secondary | ICD-10-CM

## 2011-08-16 ENCOUNTER — Other Ambulatory Visit (INDEPENDENT_AMBULATORY_CARE_PROVIDER_SITE_OTHER): Payer: Medicare Other | Admitting: Ophthalmology

## 2011-08-16 ENCOUNTER — Ambulatory Visit: Payer: Self-pay | Admitting: Cardiology

## 2011-08-16 DIAGNOSIS — I4891 Unspecified atrial fibrillation: Secondary | ICD-10-CM

## 2011-08-16 DIAGNOSIS — H3581 Retinal edema: Secondary | ICD-10-CM

## 2011-08-16 DIAGNOSIS — Z7901 Long term (current) use of anticoagulants: Secondary | ICD-10-CM

## 2011-08-26 ENCOUNTER — Ambulatory Visit: Payer: BC Managed Care – PPO | Admitting: Endocrinology

## 2011-08-26 ENCOUNTER — Ambulatory Visit (INDEPENDENT_AMBULATORY_CARE_PROVIDER_SITE_OTHER)
Admission: RE | Admit: 2011-08-26 | Discharge: 2011-08-26 | Disposition: A | Discharge status: Home / Self Care | Payer: BC Managed Care – PPO | Source: Ambulatory Visit | Attending: Endocrinology | Admitting: Endocrinology

## 2011-08-26 ENCOUNTER — Telehealth: Payer: Self-pay | Admitting: Endocrinology

## 2011-08-26 ENCOUNTER — Ambulatory Visit (INDEPENDENT_AMBULATORY_CARE_PROVIDER_SITE_OTHER): Payer: BC Managed Care – PPO | Admitting: Endocrinology

## 2011-08-26 VITALS — BP 142/88 | HR 88 | Temp 98.0°F | Wt 201.0 lb

## 2011-08-26 DIAGNOSIS — R51 Headache: Secondary | ICD-10-CM

## 2011-08-26 DIAGNOSIS — R519 Headache, unspecified: Secondary | ICD-10-CM | POA: Insufficient documentation

## 2011-08-26 LAB — PROTIME-INR: INR: 2.6 — AB (ref 0.9–1.1)

## 2011-08-26 NOTE — Progress Notes (Signed)
Subjective:    Patient ID: [name], male    DOB: [birth year], 61 y.o.   MRN: [id number]  HPI Pt states few days of moderate headache, worst at the left frontal area, and assoc diplopia.  He had headache many years ago, but not recently.  No head injury.   Past Medical History  Diagnosis Date  . Chronic kidney disease     dialysis t,th,sat  . GERD (gastroesophageal reflux disease)   . Hypertension   . Neuromuscular disorder     neurophathy  . Atrial fibrillation     chronically anticoagulated with coumadin  . Hyperlipidemia   . Atrial flutter 07/07/2009    s/p ablation  . DIABETES MELLITUS, TYPE II 08/01/2008  . GOUT 08/01/2008  . ERECTILE DYSFUNCTION 08/01/2008  . PERIPHERAL NEUROPATHY 08/01/2008  . CORONARY ARTERY DISEASE 08/01/2008    non obstructive per cath 1/10; Dr. Juanda Chance  . SVT/ PSVT/ PAT 07/30/2009  . Gastroparesis 08/01/2008  . End stage renal failure on dialysis 08/01/2008  . NEPHROLITHIASIS, HX OF 08/01/2008  . Chronic systolic heart failure 06/21/2010  . COPD 07/21/2010  . Diverticulosis of colon (without mention of hemorrhage) 10/28/2010  . Benign neoplasm of colon 10/28/2010  . Hemorrhoids, internal 10/28/2010  . Renal cyst     bilateral  . Second degree Mobitz I AV block   . Cyst     infected cyst on back   . Muscle spasm of back     back muscle spasms and pt is unable to lay flat   . Abscess of back     lower middle portion of back    Past Surgical History  Procedure Date  . Atrial ablation surgery 07-2008, 07/08/09    Dr Johney Frame  . Right foot 09-2009    infection  . Colonoscopy 16yrs ago    Dr.Orr  . Av fistula placement     left upper arm  . S/p right arm surgery after fracture   . Pilonidal cyst excision     s/p    History   Social History  . Marital Status: Married    Spouse Name: N/A    Number of Children: 3  . Years of Education: N/A   Occupational History  . Sales Rep    Social History Main Topics  . Smoking status: Current Everyday  Smoker -- 0.5 packs/day for 40 years    Types: Cigarettes  . Smokeless tobacco: Never Used  . Alcohol Use: No  . Drug Use: No  . Sexually Active:    Other Topics Concern  . 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 allopurinol (ZYLOPRIM) 100 MG tablet Take 1 tablet by mouth daily.       Marland Kitchen atorvastatin (LIPITOR) 40 MG tablet Take 1 tablet (40 mg total) by mouth daily.  30 tablet  6  . B Complex-C-Folic Acid (DIALYVITE PO) Take 100 mg by mouth daily.        . carvedilol (COREG) 12.5 MG tablet Take 12.5 mg by mouth daily.        . ciprofloxacin (CIPRO) 250 MG tablet Daily.      Marland Kitchen COUMADIN 5 MG tablet Take 1 tablet (5 mg total) by mouth as directed.  40 each  3  . FOSRENOL 1000 MG chewable tablet Chew 1 tablet by mouth 3 (three) times daily with meals.       Marland Kitchen  glucose blood (ONE TOUCH ULTRA TEST) test strip Use as directed two times a day  100 each  5  . HYDROcodone-acetaminophen (VICODIN) 5-500 MG per tablet Take 1 tablet by mouth every 8 (eight) hours as needed.        . insulin aspart (NOVOLOG FLEXPEN) 100 UNIT/ML injection Inject 30 Units into the skin 3 (three) times daily before meals.  90 mL  3  . insulin glargine (LANTUS) 100 UNIT/ML injection Inject 20 Units into the skin at bedtime.       . Insulin Syringe-Needle U-100 (BD INSULIN SYRINGE ULTRAFINE) 31G X 5/16" 0.5 ML MISC 1cc use as directed       . loperamide (IMODIUM A-D) 2 MG tablet Take 2 mg by mouth Nightly.        Marland Kitchen omega-3 acid ethyl esters (LOVAZA) 1 G capsule Take 2 capsules (2 g total) by mouth daily.  180 capsule  2  . omeprazole (PRILOSEC) 20 MG capsule TAKE ONE CAPSULE AT BEDTIME  30 capsule  5  . TEMAZEPAM PO Take by mouth at bedtime.          No Known Allergies  Family History  Problem Relation Age of Onset  . Diabetes Mother   . Diabetes Maternal Grandmother   . Diabetes Paternal  Grandmother   . Cancer Neg Hx     no FH of Colon Cancer    BP 142/88  Pulse 88  Temp(Src) 98 F (36.7 C) (Oral)  Wt 201 lb (91.173 kg)  SpO2 97%  Review of Systems He also has n/v, but no loc.      Objective:   Physical Exam VITAL SIGNS:  See vs page GENERAL: no distress head: no deformity eyes: no periorbital swelling, no proptosis external nose and ears are normal mouth: no lesion seen Both eac's and tm's are normal Neck: supple CN 2-12 intact bilaterally      Assessment & Plan:  Headache, new.  There is high risk of ICH HTN, with probable situational component

## 2011-08-26 NOTE — Patient Instructions (Addendum)
let's check a ct scan now.   Please come back soon for a regular physical.  (see letter)

## 2011-08-27 ENCOUNTER — Ambulatory Visit: Payer: Self-pay | Admitting: Cardiovascular Disease

## 2011-08-27 DIAGNOSIS — I4891 Unspecified atrial fibrillation: Secondary | ICD-10-CM

## 2011-08-27 DIAGNOSIS — Z7901 Long term (current) use of anticoagulants: Secondary | ICD-10-CM

## 2011-08-30 ENCOUNTER — Telehealth: Payer: Self-pay

## 2011-08-30 NOTE — Telephone Encounter (Signed)
Sinusitis and hardening of the arteries.  No bleeding--good

## 2011-08-30 NOTE — Telephone Encounter (Signed)
Pt called requesting results of CT scan done 03/14, please advise.

## 2011-08-31 NOTE — Telephone Encounter (Signed)
Left message for pt to callback office.  

## 2011-08-31 NOTE — Telephone Encounter (Addendum)
Pt informed of CT results and also informed letter mailed out with results on Friday. Pt upset because he states no one has contacted him with results and he found out results from kidney dr.

## 2011-09-01 NOTE — Telephone Encounter (Signed)
x

## 2011-09-09 ENCOUNTER — Other Ambulatory Visit: Payer: Self-pay | Admitting: Otolaryngology

## 2011-09-09 DIAGNOSIS — H4920 Sixth [abducent] nerve palsy, unspecified eye: Secondary | ICD-10-CM

## 2011-09-09 LAB — PROTIME-INR: INR: 1.4 — AB (ref <=1.1)

## 2011-09-10 ENCOUNTER — Other Ambulatory Visit: Payer: BC Managed Care – PPO

## 2011-09-10 ENCOUNTER — Ambulatory Visit
Admission: RE | Admit: 2011-09-10 | Discharge: 2011-09-10 | Disposition: A | Discharge status: Home / Self Care | Payer: BC Managed Care – PPO | Source: Ambulatory Visit | Attending: Otolaryngology | Admitting: Otolaryngology

## 2011-09-10 DIAGNOSIS — H4920 Sixth [abducent] nerve palsy, unspecified eye: Secondary | ICD-10-CM

## 2011-09-13 ENCOUNTER — Ambulatory Visit (INDEPENDENT_AMBULATORY_CARE_PROVIDER_SITE_OTHER): Payer: BC Managed Care – PPO | Admitting: Cardiology

## 2011-09-13 ENCOUNTER — Encounter: Payer: Self-pay | Admitting: Endocrinology

## 2011-09-13 DIAGNOSIS — I4891 Unspecified atrial fibrillation: Secondary | ICD-10-CM

## 2011-09-13 DIAGNOSIS — Z7901 Long term (current) use of anticoagulants: Secondary | ICD-10-CM

## 2011-09-21 LAB — PROTIME-INR: INR: 1.6 — AB (ref 0.9–1.1)

## 2011-09-23 ENCOUNTER — Ambulatory Visit: Payer: Self-pay | Admitting: Internal Medicine

## 2011-09-23 DIAGNOSIS — I4891 Unspecified atrial fibrillation: Secondary | ICD-10-CM

## 2011-09-23 DIAGNOSIS — Z7901 Long term (current) use of anticoagulants: Secondary | ICD-10-CM

## 2011-10-12 LAB — PROTIME-INR: INR: 2.4 — AB (ref 0.9–1.1)

## 2011-10-29 ENCOUNTER — Ambulatory Visit: Payer: Self-pay | Admitting: Cardiovascular Disease

## 2011-10-29 DIAGNOSIS — Z7901 Long term (current) use of anticoagulants: Secondary | ICD-10-CM

## 2011-10-29 DIAGNOSIS — I4891 Unspecified atrial fibrillation: Secondary | ICD-10-CM

## 2011-11-02 LAB — PROTIME-INR: INR: 2.4 — AB (ref 0.9–1.1)

## 2011-11-04 ENCOUNTER — Ambulatory Visit: Payer: Self-pay | Admitting: Cardiology

## 2011-11-04 DIAGNOSIS — I4891 Unspecified atrial fibrillation: Secondary | ICD-10-CM

## 2011-11-04 DIAGNOSIS — Z7901 Long term (current) use of anticoagulants: Secondary | ICD-10-CM

## 2011-11-15 ENCOUNTER — Other Ambulatory Visit: Payer: Self-pay | Admitting: Endocrinology

## 2011-11-30 ENCOUNTER — Telehealth: Payer: Self-pay | Admitting: *Deleted

## 2011-11-30 DIAGNOSIS — N320 Bladder-neck obstruction: Secondary | ICD-10-CM

## 2011-11-30 DIAGNOSIS — G609 Hereditary and idiopathic neuropathy, unspecified: Secondary | ICD-10-CM

## 2011-11-30 DIAGNOSIS — Z0000 Encounter for general adult medical examination without abnormal findings: Secondary | ICD-10-CM

## 2011-11-30 DIAGNOSIS — E119 Type 2 diabetes mellitus without complications: Secondary | ICD-10-CM

## 2011-11-30 DIAGNOSIS — E291 Testicular hypofunction: Secondary | ICD-10-CM

## 2011-11-30 NOTE — Telephone Encounter (Signed)
Labs placed into Epic for upcoming CPX appointment.  

## 2011-11-30 NOTE — Telephone Encounter (Signed)
Message copied by Carin Primrose on Tue Nov 30, 2011  2:38 PM ------      Message from: Etheleen Sia      Created: Mon Nov 29, 2011  4:06 PM      Regarding: LABS       PHYSICAL LABS FOR July 12 APPT.  CHART SAYS HE HAS BCBS AS PRIMARY AND MEDICARE AS SECONDARY.  I WILL CALL HIM TO BE SURE HE CAN DO THE LABS.

## 2011-12-02 ENCOUNTER — Ambulatory Visit (INDEPENDENT_AMBULATORY_CARE_PROVIDER_SITE_OTHER): Payer: BC Managed Care – PPO

## 2011-12-02 DIAGNOSIS — I4891 Unspecified atrial fibrillation: Secondary | ICD-10-CM

## 2011-12-02 DIAGNOSIS — Z7901 Long term (current) use of anticoagulants: Secondary | ICD-10-CM

## 2011-12-20 ENCOUNTER — Ambulatory Visit (INDEPENDENT_AMBULATORY_CARE_PROVIDER_SITE_OTHER): Payer: BC Managed Care – PPO | Admitting: Internal Medicine

## 2011-12-20 ENCOUNTER — Ambulatory Visit (INDEPENDENT_AMBULATORY_CARE_PROVIDER_SITE_OTHER): Payer: BC Managed Care – PPO | Admitting: Ophthalmology

## 2011-12-20 ENCOUNTER — Encounter: Payer: Self-pay | Admitting: Internal Medicine

## 2011-12-20 VITALS — BP 130/72 | HR 81 | Resp 18 | Ht 73.0 in | Wt 231.1 lb

## 2011-12-20 DIAGNOSIS — E11319 Type 2 diabetes mellitus with unspecified diabetic retinopathy without macular edema: Secondary | ICD-10-CM

## 2011-12-20 DIAGNOSIS — E1139 Type 2 diabetes mellitus with other diabetic ophthalmic complication: Secondary | ICD-10-CM

## 2011-12-20 DIAGNOSIS — H43819 Vitreous degeneration, unspecified eye: Secondary | ICD-10-CM

## 2011-12-20 DIAGNOSIS — I471 Supraventricular tachycardia: Secondary | ICD-10-CM

## 2011-12-20 DIAGNOSIS — I441 Atrioventricular block, second degree: Secondary | ICD-10-CM

## 2011-12-20 DIAGNOSIS — I4891 Unspecified atrial fibrillation: Secondary | ICD-10-CM

## 2011-12-20 DIAGNOSIS — I2510 Atherosclerotic heart disease of native coronary artery without angina pectoris: Secondary | ICD-10-CM

## 2011-12-20 DIAGNOSIS — Z01810 Encounter for preprocedural cardiovascular examination: Secondary | ICD-10-CM

## 2011-12-20 DIAGNOSIS — H2510 Age-related nuclear cataract, unspecified eye: Secondary | ICD-10-CM

## 2011-12-20 NOTE — Patient Instructions (Addendum)
Your physician has requested that you have an echocardiogram. Echocardiography is a painless test that uses sound waves to create images of your heart. It provides your doctor with information about the size and shape of your heart and how well your heart's chambers and valves are working. This procedure takes approximately one hour. There are no restrictions for this procedure.   Your physician has requested that you have a lexiscan myoview. For further information please visit [email or web address]. Please follow instruction sheet, as given.   

## 2011-12-20 NOTE — Assessment & Plan Note (Signed)
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  Given his mobitz I AV block and slow AV conduction, I think that risks of AV block would be very very high with repeat ablation.  We will therefore continue our current medical strategy.

## 2011-12-20 NOTE — Assessment & Plan Note (Signed)
Remains asymptomatic No changes planned No indication for pacemaker

## 2011-12-20 NOTE — Assessment & Plan Note (Signed)
I have been asked to assess prior to renal transplantation. As he is functionally limited and has CAD risk factors, I will obtain an echo and lexiscan myoview at this time. If these are low risk, then he can proceed to surgery without further CV evaluation.

## 2011-12-20 NOTE — Assessment & Plan Note (Signed)
Maintaining sinus rhythm Continue coumadin  We will obtain an echo to evaluate for structural changes

## 2011-12-20 NOTE — Progress Notes (Signed)
PCP: Romero Belling, MD  The patient presents today for routine electrophysiology followup.  Since last being seen in our clinic, the patient reports doing reasonably well.   He remains active.   He developed CN VI palsy several months ago and finds that this is gradually improving.  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  Today, he denies symptoms of chest pain, shortness of breath, orthopnea, PND, lower extremity edema, dizziness, presyncope, syncope, or neurologic sequela.  He has rare palpitations and feels that his SVT is controlled.   The patient feels that he is tolerating medications without difficulties and is otherwise without complaint today.   Past Medical History  Diagnosis Date  . Chronic kidney disease     dialysis t,th,sat  . GERD (gastroesophageal reflux disease)   . Hypertension   . Neuromuscular disorder     neurophathy  . Atrial fibrillation     chronically anticoagulated with coumadin  . Hyperlipidemia   . Atrial flutter 07/07/2009    s/p ablation  . DIABETES MELLITUS, TYPE II 08/01/2008  . GOUT 08/01/2008  . ERECTILE DYSFUNCTION 08/01/2008  . PERIPHERAL NEUROPATHY 08/01/2008  . CORONARY ARTERY DISEASE 08/01/2008    non obstructive per cath 1/10; Dr. Juanda Chance  . 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 Gastroparesis 08/01/2008  . End stage renal failure on dialysis 08/01/2008  . NEPHROLITHIASIS, HX OF 08/01/2008  . Chronic systolic heart failure 06/21/2010  . COPD 07/21/2010  . Diverticulosis of colon (without mention of hemorrhage) 10/28/2010  . Benign neoplasm of colon 10/28/2010  . Hemorrhoids, internal 10/28/2010  . Renal cyst     bilateral  . Second degree Mobitz I AV block     asymptomatic  . Cyst     infected cyst on back   . Muscle spasm of back     back muscle spasms and pt is unable to lay flat   . Abscess of back     lower middle portion of back   Past Surgical History  Procedure Date  . Atrial ablation surgery 07-2008, 07/08/09    Dr Johney Frame    . Right foot 09-2009    infection  . Colonoscopy 64yrs ago    Dr.Orr  . Av fistula placement     left upper arm  . S/p right arm surgery after fracture   . Pilonidal cyst excision     s/p    Current Outpatient Prescriptions  Medication Sig Dispense Refill  . allopurinol (ZYLOPRIM) 100 MG tablet Take 1 tablet by mouth daily.       Marland Kitchen atorvastatin (LIPITOR) 40 MG tablet Take 1 tablet (40 mg total) by mouth daily.  30 tablet  6  . B Complex-C-Folic Acid (DIALYVITE PO) Take 100 mg by mouth daily.        . carvedilol (COREG) 12.5 MG tablet Take 12.5 mg by mouth daily.        Marland Kitchen COUMADIN 5 MG tablet Take 1 tablet (5 mg total) by mouth as directed.  40 each  3  . FOSRENOL 1000 MG chewable tablet Chew 1 tablet by mouth 3 (three) times daily with meals.       Marland Kitchen glucose blood (ONE TOUCH ULTRA TEST) test strip Use as directed two times a day  100 each  5  . insulin aspart (NOVOLOG FLEXPEN) 100 UNIT/ML injection Inject 30 Units into the skin 3 (three) times daily before meals.  90 mL  3  . insulin glargine (LANTUS)  100 UNIT/ML injection Inject 20 Units into the skin at bedtime.       . Insulin Syringe-Needle U-100 (BD INSULIN SYRINGE ULTRAFINE) 31G X 5/16" 0.5 ML MISC 1cc use as directed       . LYRICA 50 MG capsule Take 50 mg by mouth daily.       Marland Kitchen omega-3 acid ethyl esters (LOVAZA) 1 G capsule Take 2 capsules (2 g total) by mouth daily.  180 capsule  2  . omeprazole (PRILOSEC) 20 MG capsule TAKE ONE CAPSULE AT BEDTIME  30 capsule  5  . TEMAZEPAM PO Take by mouth at bedtime.        . ciprofloxacin (CIPRO) 250 MG tablet Daily.      Marland Kitchen ethyl chloride spray       . HYDROcodone-acetaminophen (VICODIN) 5-500 MG per tablet Take 1 tablet by mouth every 8 (eight) hours as needed.        . loperamide (IMODIUM A-D) 2 MG tablet Take 2 mg by mouth Nightly.          No Known Allergies  History   Social History  . Marital Status: Married    Spouse Name: N/A    Number of Children: 3  . Years of  Education: N/A   Occupational History  . Sales Rep    Social History Main Topics  . Smoking status: Current Everyday Smoker -- 0.5 packs/day for 40 years    Types: Cigarettes  . Smokeless tobacco: Never Used  . Alcohol Use: No  . Drug Use: No  . Sexually Active:    Other Topics Concern  . 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 Diabetes Mother   . Diabetes Maternal Grandmother   . Diabetes Paternal Grandmother   . Cancer Neg Hx     no FH of Colon Cancer    ROS-  All systems are reviewed and are negative except as outlined in the HPI above    Physical Exam: Filed Vitals:   12/20/11 1401  BP: 130/72  Pulse: 81  Resp: 18  Height: 6\' 1"  (1.854 m)  Weight: 231 lb 1.9 oz (104.835 kg)  SpO2: 97%    GEN- The patient is chronically ill appearing, alert and oriented x 3 today.   Head- normocephalic, atraumatic Eyes-  Sclera clear, conjunctiva pink, mild medial deviation of his L eye Ears- hearing intact Oropharynx- clear Neck- supple, no JVP Lymph- no cervical lymphadenopathy Lungs- Clear to ausculation bilaterally, normal work of breathing Heart- Regular rate and rhythm, no murmurs, rubs or gallops, PMI not laterally displaced GI- soft, NT, ND, + BS Extremities- no clubbing, cyanosis, 1+ BLE edema MS- no significant deformity or atrophy Skin- foot dressing clean and dry  ekg today reveals sinus rhythm 81 bpm with first degree AV block (PR 336 msec), nonspecific ST/ T wave changes  Assessment and Plan:

## 2011-12-22 ENCOUNTER — Telehealth: Payer: Self-pay | Admitting: *Deleted

## 2011-12-22 ENCOUNTER — Other Ambulatory Visit (INDEPENDENT_AMBULATORY_CARE_PROVIDER_SITE_OTHER): Payer: BC Managed Care – PPO

## 2011-12-22 DIAGNOSIS — E119 Type 2 diabetes mellitus without complications: Secondary | ICD-10-CM

## 2011-12-22 DIAGNOSIS — G609 Hereditary and idiopathic neuropathy, unspecified: Secondary | ICD-10-CM

## 2011-12-22 DIAGNOSIS — Z0000 Encounter for general adult medical examination without abnormal findings: Secondary | ICD-10-CM

## 2011-12-22 DIAGNOSIS — N320 Bladder-neck obstruction: Secondary | ICD-10-CM

## 2011-12-22 DIAGNOSIS — E291 Testicular hypofunction: Secondary | ICD-10-CM

## 2011-12-22 LAB — CBC WITH DIFFERENTIAL/PLATELET
Basophils Relative: 0.7 % (ref 0.0–3.0)
Eosinophils Absolute: 0.7 10*3/uL (ref 0.0–0.7)
Eosinophils Relative: 6.7 % — ABNORMAL HIGH (ref 0.0–5.0)
Lymphocytes Relative: 29.2 % (ref 12.0–46.0)
MCHC: 33.3 g/dL (ref 30.0–36.0)
Neutrophils Relative %: 58.1 % (ref 43.0–77.0)
RBC: 3.79 Mil/uL — ABNORMAL LOW (ref 4.22–5.81)
WBC: 10.6 10*3/uL — ABNORMAL HIGH (ref 4.5–10.5)

## 2011-12-22 LAB — URINALYSIS, ROUTINE W REFLEX MICROSCOPIC
Nitrite: NEGATIVE
Specific Gravity, Urine: 1.02 (ref 1.000–1.030)
Urobilinogen, UA: 0.2 (ref 0.0–1.0)

## 2011-12-22 LAB — HEPATIC FUNCTION PANEL
ALT: 18 U/L (ref 0–53)
AST: 20 U/L (ref 0–37)
Bilirubin, Direct: 0 mg/dL (ref 0.0–0.3)
Total Bilirubin: 0.6 mg/dL (ref 0.3–1.2)

## 2011-12-22 LAB — BASIC METABOLIC PANEL
BUN: 51 mg/dL — ABNORMAL HIGH (ref 6–23)
Chloride: 92 mEq/L — ABNORMAL LOW (ref 96–112)
Potassium: 4.6 mEq/L (ref 3.5–5.1)

## 2011-12-22 LAB — LIPID PANEL
Cholesterol: 248 mg/dL — ABNORMAL HIGH (ref 0–200)
Total CHOL/HDL Ratio: 6

## 2011-12-22 LAB — TSH: TSH: 0.66 u[IU]/mL (ref 0.35–5.50)

## 2011-12-22 NOTE — Telephone Encounter (Signed)
Lab called with critical-Creatinine 6.85, BUN 51, GFR 8.75-SAE pt

## 2011-12-22 NOTE — Telephone Encounter (Signed)
Telephoned HD- SW  To obtain labs from 12/14/11. Labs were not drawn, thus new order sent to have them drawn tomorrow.

## 2011-12-23 LAB — PROTIME-INR: INR: 1.6 — AB (ref 0.9–1.1)

## 2011-12-23 LAB — LUTEINIZING HORMONE: LH: 11.35 m[IU]/mL — ABNORMAL HIGH (ref 1.50–9.30)

## 2011-12-23 LAB — PSA: PSA: 1.24 ng/mL (ref 0.10–4.00)

## 2011-12-23 NOTE — Telephone Encounter (Signed)
Will forward to SAE as i have never seen this pt (suspect routed to me in error)

## 2011-12-24 ENCOUNTER — Ambulatory Visit (HOSPITAL_COMMUNITY): Payer: BC Managed Care – PPO | Attending: Cardiovascular Disease

## 2011-12-24 ENCOUNTER — Encounter: Payer: Self-pay | Admitting: Endocrinology

## 2011-12-24 ENCOUNTER — Ambulatory Visit (INDEPENDENT_AMBULATORY_CARE_PROVIDER_SITE_OTHER): Payer: BC Managed Care – PPO | Admitting: Endocrinology

## 2011-12-24 VITALS — BP 138/82 | HR 93 | Temp 96.8°F | Wt 229.0 lb

## 2011-12-24 DIAGNOSIS — I10 Essential (primary) hypertension: Secondary | ICD-10-CM | POA: Insufficient documentation

## 2011-12-24 DIAGNOSIS — I471 Supraventricular tachycardia, unspecified: Secondary | ICD-10-CM | POA: Insufficient documentation

## 2011-12-24 DIAGNOSIS — I2510 Atherosclerotic heart disease of native coronary artery without angina pectoris: Secondary | ICD-10-CM

## 2011-12-24 DIAGNOSIS — N189 Chronic kidney disease, unspecified: Secondary | ICD-10-CM | POA: Insufficient documentation

## 2011-12-24 DIAGNOSIS — I4430 Unspecified atrioventricular block: Secondary | ICD-10-CM | POA: Insufficient documentation

## 2011-12-24 DIAGNOSIS — E1029 Type 1 diabetes mellitus with other diabetic kidney complication: Secondary | ICD-10-CM

## 2011-12-24 DIAGNOSIS — I517 Cardiomegaly: Secondary | ICD-10-CM | POA: Insufficient documentation

## 2011-12-24 DIAGNOSIS — E119 Type 2 diabetes mellitus without complications: Secondary | ICD-10-CM

## 2011-12-24 DIAGNOSIS — I4891 Unspecified atrial fibrillation: Secondary | ICD-10-CM | POA: Insufficient documentation

## 2011-12-24 DIAGNOSIS — N058 Unspecified nephritic syndrome with other morphologic changes: Secondary | ICD-10-CM

## 2011-12-24 DIAGNOSIS — I512 Rupture of papillary muscle, not elsewhere classified: Secondary | ICD-10-CM | POA: Insufficient documentation

## 2011-12-24 NOTE — Progress Notes (Signed)
Subjective:    Patient ID: [name], male    DOB: [birth year], 61 y.o.   MRN: [id number]  HPI here for regular wellness examination.  He's feeling pretty well in general, and says chronic med probs are stable, except as noted below Past Medical History  Diagnosis Date  . Chronic kidney disease     dialysis t,th,sat  . GERD (gastroesophageal reflux disease)   . Hypertension   . Neuromuscular disorder     neurophathy  . Atrial fibrillation     chronically anticoagulated with coumadin  . Hyperlipidemia   . Atrial flutter 07/07/2009    s/p ablation  . DIABETES MELLITUS, TYPE II 08/01/2008  . GOUT 08/01/2008  . ERECTILE DYSFUNCTION 08/01/2008  . PERIPHERAL NEUROPATHY 08/01/2008  . CORONARY ARTERY DISEASE 08/01/2008    non obstructive per cath 1/10; Dr. Juanda Chance  . 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 Gastroparesis 08/01/2008  . End stage renal failure on dialysis 08/01/2008  . NEPHROLITHIASIS, HX OF 08/01/2008  . Chronic systolic heart failure 06/21/2010  . COPD 07/21/2010  . Diverticulosis of colon (without mention of hemorrhage) 10/28/2010  . Benign neoplasm of colon 10/28/2010  . Hemorrhoids, internal 10/28/2010  . Renal cyst     bilateral  . Second degree Mobitz I AV block     asymptomatic  . Cyst     infected cyst on back   . Muscle spasm of back     back muscle spasms and pt is unable to lay flat   . Abscess of back     lower middle portion of back    Past Surgical History  Procedure Date  . Atrial ablation surgery 07-2008, 07/08/09    Dr Johney Frame  . Right foot 09-2009    infection  . Colonoscopy 35yrs ago    Dr.Orr  . Av fistula placement     left upper arm  . S/p right arm surgery after fracture   . Pilonidal cyst excision     s/p    History   Social History  . Marital Status: Married    Spouse Name: N/A    Number of Children: 3  . Years of Education: N/A   Occupational History  . Sales Rep    Social History Main Topics  . Smoking  status: Current Everyday Smoker -- 0.5 packs/day for 40 years    Types: Cigarettes  . Smokeless tobacco: Never Used  . Alcohol Use: No  . Drug Use: No  . Sexually Active:    Other Topics Concern  . 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 allopurinol (ZYLOPRIM) 100 MG tablet Take 1 tablet by mouth daily.       Marland Kitchen atorvastatin (LIPITOR) 40 MG tablet Take 1 tablet (40 mg total) by mouth daily.  30 tablet  6  . B Complex-C-Folic Acid (DIALYVITE PO) Take 100 mg by mouth daily.        . carvedilol (COREG) 12.5 MG tablet Take 12.5 mg by mouth daily.        Marland Kitchen COUMADIN 5 MG tablet Take 1 tablet (5 mg total) by mouth as directed.  40 each  3  . ethyl chloride spray       . FOSRENOL 1000 MG chewable tablet Chew 1 tablet by mouth 3 (three) times daily with meals.       Marland Kitchen  glucose blood (ONE TOUCH ULTRA TEST) test strip Use as directed two times a day  100 each  5  . insulin aspart (NOVOLOG FLEXPEN) 100 UNIT/ML injection Inject 30 Units into the skin 3 (three) times daily before meals.  90 mL  3  . insulin glargine (LANTUS) 100 UNIT/ML injection Inject 40 Units into the skin at bedtime.       . Insulin Syringe-Needle U-100 (BD INSULIN SYRINGE ULTRAFINE) 31G X 5/16" 0.5 ML MISC 1cc use as directed       . LYRICA 50 MG capsule Take 50 mg by mouth daily.       Marland Kitchen omeprazole (PRILOSEC) 20 MG capsule TAKE ONE CAPSULE AT BEDTIME  30 capsule  5  . TEMAZEPAM PO Take by mouth at bedtime.        Marland Kitchen DISCONTD: omega-3 acid ethyl esters (LOVAZA) 1 G capsule Take 2 capsules (2 g total) by mouth daily.  180 capsule  2    No Known Allergies  Family History  Problem Relation Age of Onset  . Diabetes Mother   . Diabetes Maternal Grandmother   . Diabetes Paternal Grandmother   . Cancer Neg Hx     no FH of Colon Cancer    BP 138/82  Pulse 93  Temp 96.8 F (36 C) (Oral)  Wt 229 lb  (103.874 kg)  SpO2 97%  Review of Systems  Constitutional: Negative for fever.  HENT: Negative for hearing loss.   Eyes: Negative for visual disturbance.  Respiratory: Negative for shortness of breath.   Cardiovascular: Negative for chest pain.  Gastrointestinal: Negative for anal bleeding.  Genitourinary: Negative for hematuria.       Slightly decreased urinary stream  Musculoskeletal: Negative for back pain.  Skin: Negative for rash.  Neurological: Positive for numbness.  Hematological: Bruises/bleeds easily.  Psychiatric/Behavioral: Negative for dysphoric mood.      Objective:   Physical Exam VS: see vs page GEN: no distress HEAD: head: no deformity eyes: no periorbital swelling, no proptosis external nose and ears are normal mouth: no lesion seen NECK: supple, thyroid is not enlarged CHEST WALL: no deformity LUNGS: clear to auscultation BREASTS:  No gynecomastia CV: reg rate and rhythm, no murmur ABD: abdomen is soft, nontender.  no hepatosplenomegaly.  not distended.  no hernia RECTAL/PROSTATE:  Declined (pt says he recently had this checked at [facility]). MUSCULOSKELETAL: muscle bulk and strength are grossly normal.  no obvious joint swelling.  Gait is steady with a cane EXTEMITIES: no deformity.  no ulcer on the feet.  feet are of normal color and temp.  1+ bilat leg edema.  Healed ulcer on the right foot.  There is bilateral onychomycosis.  Hyperpigmentation on the legs.  PULSES: dorsalis pedis intact bilat.  no carotid bruit. NEURO:  cn 2-12 grossly intact.   readily moves all 4's.  sensation is intact to touch on the feet, but decreased from normal SKIN:  Normal texture and temperature.  No rash or suspicious lesion is visible.   NODES:  None palpable at the neck PSYCH: alert, oriented x3.  Does not appear anxious nor depressed.      Assessment & Plan:  Wellness visit today, with problems stable, except as noted.   SEPARATE EVALUATION FOLLOWS--EACH PROBLEM HERE  IS NEW, NOT RESPONDING TO TREATMENT, OR POSES SIGNIFICANT RISK TO THE PATIENT'S HEALTH: HISTORY OF THE PRESENT ILLNESS: The state of at least three ongoing medical problems is addressed today: Hypogonadism: pt says he has chronic ED sxs  Pt says his diet is poor.  no cbg record, but states cbg's vary from 170-200.  He says it is highest in am.  denies hypoglycemia Dyslipidemia: he reports weight gain PAST MEDICAL HISTORY reviewed and up to date today REVIEW OF SYSTEMS: Denies LOC.  He has slight headache PHYSICAL EXAMINATION: VITAL SIGNS:  See vs page GENERAL: no distress GENITALIA: Normal male testicles, scrotum, and penis LAB/XRAY RESULTS: Lab Results  Component Value Date   CHOL 248* 12/22/2011   HDL 41.80 12/22/2011   LDLCALC 30 04/30/2011   LDLDIRECT 116.3 12/22/2011   TRIG 409.0 Triglyceride is over 400; calculations on Lipids are invalid.* 12/22/2011   CHOLHDL 6 12/22/2011   Lab Results  Component Value Date   TESTOSTERONE 247.99* 12/22/2011   Lab Results  Component Value Date   HGBA1C 8.7* 12/22/2011  IMPRESSION: Idiopathic primary hypogonadism. Pt declines rx. Karyotype is of no value in this situation. DM.  needs increased rx Dyslipidemia, prob exac by weight gain PLAN: See instruction page

## 2011-12-24 NOTE — Patient Instructions (Addendum)
Increase lantus to 40 units at bedtime. Please come back for a follow-up appointment in 6 weeks.  good diet and exercise habits significanly improve the control of your diabetes.  please let me know if you wish to be referred to a dietician.  high blood sugar is very risky to your health.  you should see an eye doctor every year.  controlling your blood pressure and cholesterol drastically reduces the damage diabetes does to your body.  this also applies to quitting smoking.  please discuss these with your doctor.  you should take an aspirin every day, unless you have been advised by a doctor not to. check your blood sugar twice a day.  vary the time of day when you check, between before the 3 meals, and at bedtime.  also check if you have symptoms of your blood sugar being too high or too low.  please keep a record of the readings and bring it to your next appointment here.  please call us sooner if your blood sugar goes below 70, or if it stays over 200.   Increase lovaza to 3 pills/day.   please consider these measures for your health:  minimize alcohol.  do not use tobacco products.  have a colonoscopy at least every 10 years from age 72.  keep firearms safely stored.  always use seat belts.  have working smoke alarms in your home.  see an eye doctor and dentist regularly.  never drive under the influence of alcohol or drugs (including prescription drugs).  those with fair skin should take precautions against the sun. please let me know what your wishes would be, if artificial life support measures should become necessary.  it is critically important to prevent falling down (keep floor areas well-lit, dry, and free of loose objects.  If you have a cane, walker, or wheelchair, you should use it, even for short trips around the house.  Also, try not to rush) You should have a vaccine against shingles (a painful rash which results from the  chickenpox infection which most people had many years ago).  This  vaccine reduces, but does not totally eliminate the risk of shingles.  Because this is a medicare part d benefit, you should get it at a pharmacy.

## 2011-12-24 NOTE — Progress Notes (Signed)
Echocardiogram performed.  

## 2011-12-25 DIAGNOSIS — E1065 Type 1 diabetes mellitus with hyperglycemia: Secondary | ICD-10-CM | POA: Insufficient documentation

## 2011-12-27 ENCOUNTER — Ambulatory Visit: Payer: Self-pay | Admitting: Internal Medicine

## 2011-12-27 ENCOUNTER — Ambulatory Visit (HOSPITAL_COMMUNITY): Payer: BC Managed Care – PPO | Attending: Cardiology | Admitting: Radiology

## 2011-12-27 VITALS — BP 110/63 | Ht 73.0 in | Wt 228.0 lb

## 2011-12-27 DIAGNOSIS — Z7901 Long term (current) use of anticoagulants: Secondary | ICD-10-CM

## 2011-12-27 DIAGNOSIS — I252 Old myocardial infarction: Secondary | ICD-10-CM | POA: Insufficient documentation

## 2011-12-27 DIAGNOSIS — I4891 Unspecified atrial fibrillation: Secondary | ICD-10-CM | POA: Insufficient documentation

## 2011-12-27 DIAGNOSIS — E109 Type 1 diabetes mellitus without complications: Secondary | ICD-10-CM

## 2011-12-27 DIAGNOSIS — F17200 Nicotine dependence, unspecified, uncomplicated: Secondary | ICD-10-CM | POA: Insufficient documentation

## 2011-12-27 DIAGNOSIS — I2510 Atherosclerotic heart disease of native coronary artery without angina pectoris: Secondary | ICD-10-CM

## 2011-12-27 DIAGNOSIS — R0602 Shortness of breath: Secondary | ICD-10-CM | POA: Insufficient documentation

## 2011-12-27 DIAGNOSIS — I10 Essential (primary) hypertension: Secondary | ICD-10-CM | POA: Insufficient documentation

## 2011-12-27 DIAGNOSIS — Z794 Long term (current) use of insulin: Secondary | ICD-10-CM | POA: Insufficient documentation

## 2011-12-27 DIAGNOSIS — I4892 Unspecified atrial flutter: Secondary | ICD-10-CM | POA: Insufficient documentation

## 2011-12-27 DIAGNOSIS — R002 Palpitations: Secondary | ICD-10-CM | POA: Insufficient documentation

## 2011-12-27 DIAGNOSIS — E785 Hyperlipidemia, unspecified: Secondary | ICD-10-CM | POA: Insufficient documentation

## 2011-12-27 DIAGNOSIS — R000 Tachycardia, unspecified: Secondary | ICD-10-CM | POA: Insufficient documentation

## 2011-12-27 DIAGNOSIS — R5381 Other malaise: Secondary | ICD-10-CM | POA: Insufficient documentation

## 2011-12-27 DIAGNOSIS — J4489 Other specified chronic obstructive pulmonary disease: Secondary | ICD-10-CM | POA: Insufficient documentation

## 2011-12-27 DIAGNOSIS — J449 Chronic obstructive pulmonary disease, unspecified: Secondary | ICD-10-CM | POA: Insufficient documentation

## 2011-12-27 DIAGNOSIS — E119 Type 2 diabetes mellitus without complications: Secondary | ICD-10-CM | POA: Insufficient documentation

## 2011-12-27 DIAGNOSIS — R42 Dizziness and giddiness: Secondary | ICD-10-CM | POA: Insufficient documentation

## 2011-12-27 MED ORDER — TECHNETIUM TC 99M TETROFOSMIN IV KIT
11.0000 | PACK | Freq: Once | INTRAVENOUS | Status: AC | PRN
  Administered 2011-12-27: 11 via INTRAVENOUS

## 2011-12-27 MED ORDER — TECHNETIUM TC 99M TETROFOSMIN IV KIT
33.0000 | PACK | Freq: Once | INTRAVENOUS | Status: AC | PRN
  Administered 2011-12-27: 33 via INTRAVENOUS

## 2011-12-27 MED ORDER — REGADENOSON 0.4 MG/5ML IV SOLN
0.4000 mg | Freq: Once | INTRAVENOUS | Status: AC
  Administered 2011-12-27: 0.4 mg via INTRAVENOUS

## 2011-12-27 NOTE — Progress Notes (Signed)
[facility] SITE 3 NUCLEAR MED [address] [place] [address] [phone]  Cardiology Nuclear Med Study  [name] is a 61 y.o. male     MRN : [id number]     DOB: [birth year]  Procedure Date: 12/27/2011  Nuclear Med Background Indication for Stress Test:  Evaluation for Ischemia, and Pending Surgical Clearance for  Renal Transplant at [facility]  History:  10',11' Ablation;Afib/Aflutter;COPD;Echo'09'Heart Catheterization;11' Myocardial Perfusion STudy Cardiac Risk Factors: Hypertension, IDDM Type 2, Lipids and Smoker  Symptoms:  Dizziness, Fatigue, Fatigue with Exertion, Light-Headedness, Palpitations, Rapid HR and SOB   Nuclear Pre-Procedure Caffeine/Decaff Intake:  None > 12 hrs NPO After: 8:00am   Lungs:  clear O2 Sat: 98% on room air. IV 0.9% NS with Angio Cath:  22g  IV Site: R Hand x 1, tolerated well IV Started by:  Irean Hong, RN  Chest Size (in):  48 Cup Size: n/a  Height: 6\' 1"  (1.854 m)  Weight:  228 lb (103.42 kg)  BMI:  Body mass index is 30.08 kg/(m^2). 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 Johney Frame    Nuclear Med Study 1 or 2 day study: 1 day  Stress Test Type:  Eugenie Birks  Reading MD: Marca Ancona, MD  Order Authorizing Provider:  Hillis Range, MD  Resting Radionuclide: Technetium 67m Tetrofosmin  Resting Radionuclide Dose: 11.0 mCi   Stress Radionuclide:  Technetium 66m Tetrofosmin  Stress Radionuclide Dose: 33.0 mCi           Stress Protocol Rest HR: 69 Stress HR: 89  Rest BP: 110/63 Stress BP: 173/63  Exercise Time (min): n/a METS: n/a   Predicted Max HR: 159 bpm % Max HR: 50.94 bpm Rate Pressure Product: 78469   Dose of Adenosine (mg):  n/a Dose of Lexiscan: 0.4 mg  Dose of Atropine (mg): n/a Dose of Dobutamine: n/a mcg/kg/min (at max HR)  Stress Test Technologist: Frederick Peers, EMT-P  Nuclear Technologist:  Domenic Polite, CNMT     Rest Procedure:  Myocardial  perfusion imaging was performed at rest 45 minutes following the intravenous administration of Technetium 50m Tetrofosmin. Rest ECG: SR with 1 degree AVB  Stress Procedure:  The patient received IV Lexiscan 0.4 mg over 15-seconds.  Technetium 91m Tetrofosmin injected at 30-seconds.  There were no significant changes with Lexiscan.  Quantitative spect images were obtained after a 45 minute delay. Stress ECG: No significant change from baseline ECG  QPS Raw Data Images:  Normal; no motion artifact; normal heart/lung ratio. Stress Images:  Small, mild apical perfusion defect.  Rest Images:  Small, mild apical perfusion defect.  Subtraction (SDS):  Fixed small, mild apical perfusion defect.  Transient Ischemic Dilatation (Normal <1.22):  0.91 Lung/Heart Ratio (Normal <0.45):  0.36  Quantitative Gated Spect Images QGS EDV:  153 ml QGS ESV:  67 ml  Impression Exercise Capacity:  Lexiscan with no exercise. BP Response:  Normal blood pressure response. Clinical Symptoms:  Short of breath.  ECG Impression:  No significant ST segment change suggestive of ischemia. Comparison with Prior Nuclear Study: No images to compare  Overall Impression:  Normal stress nuclear study. Fixed small, mild apical perfusion defect likely represents apical thinning.  No ischemia.   LV Ejection Fraction: 56%.  LV Wall Motion:  NL LV Function; NL Wall Motion  Marca Ancona 12/27/2011

## 2011-12-28 ENCOUNTER — Other Ambulatory Visit: Payer: Self-pay

## 2011-12-28 MED ORDER — COUMADIN 5 MG PO TABS: 5.0000 mg | ORAL_TABLET | ORAL | Status: DC

## 2011-12-29 ENCOUNTER — Telehealth: Payer: Self-pay | Admitting: Internal Medicine

## 2011-12-29 NOTE — Telephone Encounter (Signed)
Stress,LOV faxed to [facility] for [facility] @ [phone]  12/29/11/KM

## 2012-01-03 NOTE — Telephone Encounter (Signed)
Stress,Echo,LOV faxed to [facility] @ [phone]  01/03/12/KM

## 2012-01-11 LAB — PROTIME-INR: INR: 4.2 — AB (ref 0.9–1.1)

## 2012-01-12 ENCOUNTER — Ambulatory Visit: Payer: Self-pay | Admitting: Cardiovascular Disease

## 2012-01-12 DIAGNOSIS — Z7901 Long term (current) use of anticoagulants: Secondary | ICD-10-CM

## 2012-01-12 DIAGNOSIS — I4891 Unspecified atrial fibrillation: Secondary | ICD-10-CM

## 2012-01-18 LAB — PROTIME-INR: INR: 2.5 — AB (ref <=1.1)

## 2012-01-20 ENCOUNTER — Ambulatory Visit: Payer: Self-pay | Admitting: Cardiology

## 2012-01-20 DIAGNOSIS — I4891 Unspecified atrial fibrillation: Secondary | ICD-10-CM

## 2012-01-20 DIAGNOSIS — Z7901 Long term (current) use of anticoagulants: Secondary | ICD-10-CM

## 2012-01-25 LAB — PROTIME-INR: INR: 1.9 — AB (ref <=1.1)

## 2012-01-28 ENCOUNTER — Ambulatory Visit (INDEPENDENT_AMBULATORY_CARE_PROVIDER_SITE_OTHER): Payer: Self-pay | Admitting: Internal Medicine

## 2012-01-28 DIAGNOSIS — I4891 Unspecified atrial fibrillation: Secondary | ICD-10-CM

## 2012-01-28 DIAGNOSIS — Z7901 Long term (current) use of anticoagulants: Secondary | ICD-10-CM

## 2012-01-28 DIAGNOSIS — R0989 Other specified symptoms and signs involving the circulatory and respiratory systems: Secondary | ICD-10-CM

## 2012-02-11 ENCOUNTER — Ambulatory Visit (INDEPENDENT_AMBULATORY_CARE_PROVIDER_SITE_OTHER): Payer: BC Managed Care – PPO | Admitting: Endocrinology

## 2012-02-11 VITALS — BP 126/62 | HR 74 | Temp 97.5°F | Wt 236.0 lb

## 2012-02-11 DIAGNOSIS — E119 Type 2 diabetes mellitus without complications: Secondary | ICD-10-CM

## 2012-02-11 NOTE — Patient Instructions (Addendum)
Increase lantus to 40 units at bedtime. Please come back for a follow-up appointment in 3 months.   check your blood sugar twice a day.  vary the time of day when you check, between before the 3 meals, and at bedtime.  also check if you have symptoms of your blood sugar being too high or too low.  please keep a record of the readings and bring it to your next appointment here.  please call us sooner if your blood sugar goes below 70, or if it stays over 200.

## 2012-02-11 NOTE — Progress Notes (Signed)
Subjective:    Patient ID: [name], male    DOB: 19-Feb-1951, 61 y.o.   MRN: [id number]  HPI Pt returns for f/u of insulin-requiring DM (dx'ed 1983; complicated by foot ulcer, retinopathy, gastroparesis, peripheral sensory neuropathy, CAD, and renal failure).  He says he did not increase the insulin as advised.  no cbg record, but states cbg's are high.   Past Medical History  Diagnosis Date  . Chronic kidney disease     dialysis t,th,sat  . GERD (gastroesophageal reflux disease)   . Hypertension   . Neuromuscular disorder     neurophathy  . Atrial fibrillation     chronically anticoagulated with coumadin  . Hyperlipidemia   . Atrial flutter 07/07/2009    s/p ablation  . DIABETES MELLITUS, TYPE II 08/01/2008  . GOUT 08/01/2008  . ERECTILE DYSFUNCTION 08/01/2008  . PERIPHERAL NEUROPATHY 08/01/2008  . CORONARY ARTERY DISEASE 08/01/2008    non obstructive per cath 1/10; Dr. Juanda Chance  . 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 Gastroparesis 08/01/2008  . End stage renal failure on dialysis 08/01/2008  . NEPHROLITHIASIS, HX OF 08/01/2008  . Chronic systolic heart failure 06/21/2010  . COPD 07/21/2010  . Diverticulosis of colon (without mention of hemorrhage) 10/28/2010  . Benign neoplasm of colon 10/28/2010  . Hemorrhoids, internal 10/28/2010  . Renal cyst     bilateral  . Second degree Mobitz I AV block     asymptomatic  . Cyst     infected cyst on back   . Muscle spasm of back     back muscle spasms and pt is unable to lay flat   . Abscess of back     lower middle portion of back    Past Surgical History  Procedure Date  . Atrial ablation surgery 07-2008, 07/08/09    Dr Johney Frame  . Right foot 09-2009    infection  . Colonoscopy 63yrs ago    Dr.Orr  . Av fistula placement     left upper arm  . S/p right arm surgery after fracture   . Pilonidal cyst excision     s/p    History   Social History  . Marital Status: Married    Spouse Name: N/A    Number of  Children: 3  . Years of Education: N/A   Occupational History  . Sales Rep    Social History Main Topics  . Smoking status: Current Everyday Smoker -- 0.5 packs/day for 40 years    Types: Cigarettes  . Smokeless tobacco: Never Used  . Alcohol Use: No  . Drug Use: No  . Sexually Active:    Other Topics Concern  . 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 allopurinol (ZYLOPRIM) 100 MG tablet Take 1 tablet by mouth daily.       Marland Kitchen atorvastatin (LIPITOR) 40 MG tablet Take 1 tablet (40 mg total) by mouth daily.  30 tablet  6  . B Complex-C-Folic Acid (DIALYVITE PO) Take 100 mg by mouth daily.        . carvedilol (COREG) 12.5 MG tablet Take 12.5 mg by mouth daily.        Marland Kitchen COUMADIN 5 MG tablet Take 1 tablet (5 mg total) by mouth as directed.  40 each  3  . ethyl chloride spray       .  FOSRENOL 1000 MG chewable tablet Chew 1 tablet by mouth 3 (three) times daily with meals.       . insulin aspart (NOVOLOG FLEXPEN) 100 UNIT/ML injection Inject 30 Units into the skin 3 (three) times daily before meals.  90 mL  3  . insulin glargine (LANTUS) 100 UNIT/ML injection Inject 40 Units into the skin at bedtime.       . Insulin Syringe-Needle U-100 (BD INSULIN SYRINGE ULTRAFINE) 31G X 5/16" 0.5 ML MISC 1cc use as directed       . LYRICA 50 MG capsule Take 50 mg by mouth daily.       Marland Kitchen omega-3 acid ethyl esters (LOVAZA) 1 G capsule Take 3 capsules by mouth daily.      Marland Kitchen omeprazole (PRILOSEC) 20 MG capsule TAKE ONE CAPSULE AT BEDTIME  30 capsule  5  . TEMAZEPAM PO Take by mouth at bedtime.          No Known Allergies  Family History  Problem Relation Age of Onset  . Diabetes Mother   . Diabetes Maternal Grandmother   . Diabetes Paternal Grandmother   . Cancer Neg Hx     no FH of Colon Cancer    BP 126/62  Pulse 74  Temp 97.5 F (36.4 C) (Oral)  Wt 236 lb  (107.049 kg)  SpO2 98%    Review of Systems denies hypoglycemia.      Objective:   Physical Exam VITAL SIGNS:  See vs page GENERAL: no distress SKIN:  Insulin injection sites at the anterior abdomen are normal. (sees podiatry)       Assessment & Plan:  DM: needs increased rx

## 2012-02-15 ENCOUNTER — Ambulatory Visit: Payer: Self-pay | Admitting: Cardiology

## 2012-02-15 DIAGNOSIS — I4891 Unspecified atrial fibrillation: Secondary | ICD-10-CM

## 2012-02-15 DIAGNOSIS — Z7901 Long term (current) use of anticoagulants: Secondary | ICD-10-CM

## 2012-02-16 ENCOUNTER — Encounter: Payer: Self-pay | Admitting: General Practice

## 2012-02-28 ENCOUNTER — Ambulatory Visit: Payer: Self-pay | Admitting: Cardiology

## 2012-02-28 DIAGNOSIS — Z7901 Long term (current) use of anticoagulants: Secondary | ICD-10-CM

## 2012-02-28 DIAGNOSIS — I4891 Unspecified atrial fibrillation: Secondary | ICD-10-CM

## 2012-03-10 ENCOUNTER — Ambulatory Visit: Payer: Self-pay | Admitting: Cardiology

## 2012-03-10 DIAGNOSIS — I4891 Unspecified atrial fibrillation: Secondary | ICD-10-CM

## 2012-03-10 DIAGNOSIS — Z7901 Long term (current) use of anticoagulants: Secondary | ICD-10-CM

## 2012-03-30 LAB — PROTIME-INR: INR: 3.4 — AB (ref 0.9–1.1)

## 2012-04-03 ENCOUNTER — Ambulatory Visit: Payer: Self-pay | Admitting: Cardiology

## 2012-04-03 DIAGNOSIS — I4891 Unspecified atrial fibrillation: Secondary | ICD-10-CM

## 2012-04-03 DIAGNOSIS — Z7901 Long term (current) use of anticoagulants: Secondary | ICD-10-CM

## 2012-05-01 ENCOUNTER — Ambulatory Visit: Payer: Self-pay | Admitting: Cardiology

## 2012-05-01 DIAGNOSIS — Z7901 Long term (current) use of anticoagulants: Secondary | ICD-10-CM

## 2012-05-01 DIAGNOSIS — I4891 Unspecified atrial fibrillation: Secondary | ICD-10-CM

## 2012-05-09 LAB — PROTIME-INR: INR: 2.1 — AB (ref 0.9–1.1)

## 2012-05-12 ENCOUNTER — Ambulatory Visit: Payer: Self-pay | Admitting: Cardiovascular Disease

## 2012-05-12 DIAGNOSIS — I4891 Unspecified atrial fibrillation: Secondary | ICD-10-CM

## 2012-05-12 DIAGNOSIS — Z7901 Long term (current) use of anticoagulants: Secondary | ICD-10-CM

## 2012-05-15 ENCOUNTER — Ambulatory Visit: Payer: BC Managed Care – PPO | Admitting: Endocrinology

## 2012-05-18 ENCOUNTER — Other Ambulatory Visit: Payer: Self-pay | Admitting: *Deleted

## 2012-05-18 MED ORDER — OMEPRAZOLE 20 MG PO CPDR: DELAYED_RELEASE_CAPSULE | ORAL | Status: DC

## 2012-05-18 NOTE — Telephone Encounter (Signed)
R'cd fax from [facility] for refill of Omeprazole.

## 2012-05-19 ENCOUNTER — Other Ambulatory Visit: Payer: Self-pay

## 2012-05-19 MED ORDER — OMEPRAZOLE 20 MG PO CPDR: DELAYED_RELEASE_CAPSULE | ORAL | Status: DC

## 2012-05-29 ENCOUNTER — Other Ambulatory Visit: Payer: Self-pay | Admitting: *Deleted

## 2012-05-29 MED ORDER — COUMADIN 5 MG PO TABS: 5.0000 mg | ORAL_TABLET | ORAL | Status: DC

## 2012-06-08 ENCOUNTER — Ambulatory Visit: Payer: Self-pay | Admitting: Cardiology

## 2012-06-08 DIAGNOSIS — Z7901 Long term (current) use of anticoagulants: Secondary | ICD-10-CM

## 2012-06-08 DIAGNOSIS — I4891 Unspecified atrial fibrillation: Secondary | ICD-10-CM

## 2012-06-19 ENCOUNTER — Other Ambulatory Visit: Payer: Self-pay

## 2012-06-19 ENCOUNTER — Telehealth: Payer: Self-pay | Admitting: Endocrinology

## 2012-06-19 MED ORDER — INSULIN ASPART 100 UNIT/ML ~~LOC~~ SOLN: 30.0000 [IU] | Freq: Three times a day (TID) | SUBCUTANEOUS | Status: DC

## 2012-06-21 ENCOUNTER — Ambulatory Visit (INDEPENDENT_AMBULATORY_CARE_PROVIDER_SITE_OTHER): Payer: BC Managed Care – PPO | Admitting: Ophthalmology

## 2012-07-10 ENCOUNTER — Ambulatory Visit: Payer: Self-pay | Admitting: Internal Medicine

## 2012-07-10 DIAGNOSIS — I4891 Unspecified atrial fibrillation: Secondary | ICD-10-CM

## 2012-07-10 DIAGNOSIS — Z7901 Long term (current) use of anticoagulants: Secondary | ICD-10-CM

## 2012-08-03 LAB — PROTIME-INR: INR: 2.2 — AB (ref 0.9–1.1)

## 2012-08-07 ENCOUNTER — Ambulatory Visit (INDEPENDENT_AMBULATORY_CARE_PROVIDER_SITE_OTHER): Payer: Medicare Other | Admitting: Cardiovascular Disease

## 2012-08-07 DIAGNOSIS — Z7901 Long term (current) use of anticoagulants: Secondary | ICD-10-CM

## 2012-08-07 DIAGNOSIS — I4891 Unspecified atrial fibrillation: Secondary | ICD-10-CM

## 2012-09-06 ENCOUNTER — Ambulatory Visit (INDEPENDENT_AMBULATORY_CARE_PROVIDER_SITE_OTHER): Payer: Medicare Other | Admitting: Cardiology

## 2012-09-06 DIAGNOSIS — Z7901 Long term (current) use of anticoagulants: Secondary | ICD-10-CM

## 2012-09-06 DIAGNOSIS — I4891 Unspecified atrial fibrillation: Secondary | ICD-10-CM

## 2012-09-11 NOTE — Telephone Encounter (Signed)
See previous encounter

## 2012-09-12 ENCOUNTER — Other Ambulatory Visit: Payer: Self-pay | Admitting: Orthopedic Surgery

## 2012-09-12 DIAGNOSIS — L97919 Non-pressure chronic ulcer of unspecified part of right lower leg with unspecified severity: Secondary | ICD-10-CM

## 2012-09-18 ENCOUNTER — Ambulatory Visit
Admission: RE | Admit: 2012-09-18 | Discharge: 2012-09-18 | Disposition: A | Discharge status: Home / Self Care | Payer: 59 | Source: Ambulatory Visit | Attending: Orthopedic Surgery | Admitting: Orthopedic Surgery

## 2012-09-18 DIAGNOSIS — L97919 Non-pressure chronic ulcer of unspecified part of right lower leg with unspecified severity: Secondary | ICD-10-CM

## 2012-10-05 LAB — PROTIME-INR: INR: 1.8 — AB (ref 0.9–1.1)

## 2012-10-09 ENCOUNTER — Ambulatory Visit (INDEPENDENT_AMBULATORY_CARE_PROVIDER_SITE_OTHER): Payer: Self-pay | Admitting: Cardiology

## 2012-10-09 DIAGNOSIS — Z7901 Long term (current) use of anticoagulants: Secondary | ICD-10-CM

## 2012-10-09 DIAGNOSIS — I4891 Unspecified atrial fibrillation: Secondary | ICD-10-CM

## 2012-10-27 ENCOUNTER — Ambulatory Visit (INDEPENDENT_AMBULATORY_CARE_PROVIDER_SITE_OTHER): Payer: 59 | Admitting: Internal Medicine

## 2012-10-27 DIAGNOSIS — Z7901 Long term (current) use of anticoagulants: Secondary | ICD-10-CM

## 2012-10-27 DIAGNOSIS — I4891 Unspecified atrial fibrillation: Secondary | ICD-10-CM

## 2012-11-01 ENCOUNTER — Other Ambulatory Visit: Payer: Self-pay

## 2012-11-01 MED ORDER — COUMADIN 5 MG PO TABS: 5.0000 mg | ORAL_TABLET | ORAL | Status: DC

## 2012-11-10 ENCOUNTER — Encounter: Payer: Self-pay | Admitting: Endocrinology

## 2012-11-10 ENCOUNTER — Ambulatory Visit (INDEPENDENT_AMBULATORY_CARE_PROVIDER_SITE_OTHER): Payer: 59 | Admitting: Endocrinology

## 2012-11-10 VITALS — BP 142/82 | HR 80 | Ht 73.0 in | Wt 242.0 lb

## 2012-11-10 DIAGNOSIS — E1029 Type 1 diabetes mellitus with other diabetic kidney complication: Secondary | ICD-10-CM

## 2012-11-10 DIAGNOSIS — E1065 Type 1 diabetes mellitus with hyperglycemia: Secondary | ICD-10-CM

## 2012-11-10 DIAGNOSIS — E119 Type 2 diabetes mellitus without complications: Secondary | ICD-10-CM

## 2012-11-10 DIAGNOSIS — E291 Testicular hypofunction: Secondary | ICD-10-CM

## 2012-11-10 MED ORDER — GABAPENTIN 300 MG PO CAPS: 300.0000 mg | ORAL_CAPSULE | Freq: Every day | ORAL | Status: DC

## 2012-11-10 NOTE — Patient Instructions (Addendum)
Increase lantus to 40 units at bedtime. Please come back for a regular physical appointment in 3 months.   check your blood sugar twice a day.  vary the time of day when you check, between before the 3 meals, and at bedtime.  also check if you have symptoms of your blood sugar being too high or too low.  please keep a record of the readings and bring it to your next appointment here.  please call us sooner if your blood sugar goes below 70, or if it stays over 200.   The next step in safely getting your blood sugar better is to write down how it runs, and we'll go over it together.   i have sent a prescription to your pharmacy, for the neuropathy.   please stop the lyrica.

## 2012-11-10 NOTE — Progress Notes (Signed)
Subjective:    Patient ID: Perry Drum., male    DOB: 1950/12/06, 62 y.o.   MRN: [id number]  HPI Pt returns for f/u of insulin-requiring DM (dx'ed 1983; he has moderate painful neuropathy of the lower extremities; he has associated foot ulcer, retinopathy, gastroparesis, peripheral sensory neuropathy, CAD, and renal failure).  He says he did not increase the insulin as advised.  no cbg record, but pt says it varies from 92-200's.  There is no trend throughout the day.   Past Medical History  Diagnosis Date  . Chronic kidney disease     dialysis t,th,sat  . GERD (gastroesophageal reflux disease)   . Hypertension   . Neuromuscular disorder     neurophathy  . Atrial fibrillation     chronically anticoagulated with coumadin  . Hyperlipidemia   . Atrial flutter 07/07/2009    s/p ablation  . DIABETES MELLITUS, TYPE II 08/01/2008  . GOUT 08/01/2008  . ERECTILE DYSFUNCTION 08/01/2008  . PERIPHERAL NEUROPATHY 08/01/2008  . CORONARY ARTERY DISEASE 08/01/2008    non obstructive per cath 1/10; Dr. Juanda Chance  . 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 Gastroparesis 08/01/2008  . End stage renal failure on dialysis 08/01/2008  . NEPHROLITHIASIS, HX OF 08/01/2008  . Chronic systolic heart failure 06/21/2010  . COPD 07/21/2010  . Diverticulosis of colon (without mention of hemorrhage) 10/28/2010  . Benign neoplasm of colon 10/28/2010  . Hemorrhoids, internal 10/28/2010  . Renal cyst     bilateral  . Second degree Mobitz I AV block     asymptomatic  . Cyst     infected cyst on back   . Muscle spasm of back     back muscle spasms and pt is unable to lay flat   . Abscess of back     lower middle portion of back    Past Surgical History  Procedure Laterality Date  . Atrial ablation surgery  07-2008, 07/08/09    Dr Johney Frame  . Right foot  09-2009    infection  . Colonoscopy  36yrs ago    Dr.Orr  . Av fistula placement      left upper arm  . S/p right arm surgery after fracture     . Pilonidal cyst excision      s/p    History   Social History  . Marital Status: Married    Spouse Name: N/A    Number of Children: 3  . Years of Education: N/A   Occupational History  . Sales Rep    Social History Main Topics  . Smoking status: Current Every Day Smoker -- 0.50 packs/day for 40 years    Types: Cigarettes  . Smokeless tobacco: Never Used  . Alcohol Use: No  . Drug Use: No  . Sexually Active:    Other Topics Concern  . 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[employer]          Current Outpatient Prescriptions on File Prior to Visit  Medication Sig Dispense Refill  . allopurinol (ZYLOPRIM) 100 MG tablet Take 1 tablet by mouth daily.       Marland Kitchen atorvastatin (LIPITOR) 40 MG tablet Take 1 tablet (40 mg total) by mouth daily.  30 tablet  6  . B Complex-C-Folic Acid (DIALYVITE PO) Take 100 mg by mouth daily.        . carvedilol (COREG) 12.5 MG tablet Take 12.5 mg by mouth  daily.        Marland Kitchen COUMADIN 5 MG tablet Take 1 tablet (5 mg total) by mouth as directed.  40 tablet  3  . ethyl chloride spray       . FOSRENOL 1000 MG chewable tablet Chew 1 tablet by mouth 3 (three) times daily with meals.       . insulin aspart (NOVOLOG FLEXPEN) 100 UNIT/ML injection Inject 30 Units into the skin 3 (three) times daily before meals.  90 mL  3  . insulin glargine (LANTUS) 100 UNIT/ML injection Inject 40 Units into the skin at bedtime.       . Insulin Syringe-Needle U-100 (BD INSULIN SYRINGE ULTRAFINE) 31G X 5/16" 0.5 ML MISC 1cc use as directed       . omega-3 acid ethyl esters (LOVAZA) 1 G capsule Take 3 capsules by mouth daily.      Marland Kitchen omeprazole (PRILOSEC) 20 MG capsule TAKE ONE CAPSULE AT BEDTIME  30 capsule  5  . TEMAZEPAM PO Take by mouth at bedtime.         No current facility-administered medications on file prior to visit.    No Known Allergies  Family History  Problem Relation Age of Onset  . Diabetes Mother   . Diabetes Maternal  Grandmother   . Diabetes Paternal Grandmother   . Cancer Neg Hx     no FH of Colon Cancer    BP 142/82  Pulse 80  Ht 6\' 1"  (1.854 m)  Wt 242 lb (109.77 kg)  BMI 31.93 kg/m2  SpO2 98%    Review of Systems Nocturnal leg and foot pain is worse recently.  denies hypoglycemia.    Objective:   Physical Exam VITAL SIGNS:  See vs page GENERAL: no distress.     (pt says his last a1c was 8%, 1 month ago)    Assessment & Plan:  DM: he needs increased rx.  therapy limited by noncompliance with cbg recording.  i'll do the best i can. This insulin regimen was chosen from multiple options, as it best matches his insulin to his changing requirements throughout the day.  The benefits of glycemic control must be weighed against the risks of hypoglycemia.   Painful neuropathy, worse

## 2012-11-15 ENCOUNTER — Other Ambulatory Visit (HOSPITAL_COMMUNITY): Payer: Self-pay | Admitting: Orthopedic Surgery

## 2012-11-15 DIAGNOSIS — L97919 Non-pressure chronic ulcer of unspecified part of right lower leg with unspecified severity: Secondary | ICD-10-CM

## 2012-11-17 ENCOUNTER — Ambulatory Visit (INDEPENDENT_AMBULATORY_CARE_PROVIDER_SITE_OTHER): Payer: Self-pay | Admitting: Cardiology

## 2012-11-17 ENCOUNTER — Ambulatory Visit (HOSPITAL_COMMUNITY)
Admission: RE | Admit: 2012-11-17 | Discharge: 2012-11-17 | Disposition: A | Discharge status: Home / Self Care | Payer: 59 | Source: Ambulatory Visit | Attending: Cardiology | Admitting: Cardiology

## 2012-11-17 ENCOUNTER — Other Ambulatory Visit (HOSPITAL_COMMUNITY): Payer: Self-pay | Admitting: *Deleted

## 2012-11-17 DIAGNOSIS — Z7901 Long term (current) use of anticoagulants: Secondary | ICD-10-CM

## 2012-11-17 DIAGNOSIS — I4891 Unspecified atrial fibrillation: Secondary | ICD-10-CM

## 2012-11-17 DIAGNOSIS — I83019 Varicose veins of right lower extremity with ulcer of unspecified site: Secondary | ICD-10-CM

## 2012-11-17 DIAGNOSIS — M7989 Other specified soft tissue disorders: Secondary | ICD-10-CM

## 2012-11-17 DIAGNOSIS — I872 Venous insufficiency (chronic) (peripheral): Secondary | ICD-10-CM | POA: Insufficient documentation

## 2012-11-17 NOTE — Progress Notes (Signed)
Right Lower Ext. Venous Duplex Completed. Preliminary by tech.. No evidence of DVT. Venous Reflux was noted in the calf.  Marilynne Halsted, RDMS, RVT

## 2012-11-25 ENCOUNTER — Emergency Department (HOSPITAL_COMMUNITY): Payer: 59

## 2012-11-25 ENCOUNTER — Emergency Department (HOSPITAL_COMMUNITY)
Admission: EM | Admit: 2012-11-25 | Discharge: 2012-11-25 | Disposition: A | Discharge status: Home / Self Care | Payer: 59 | Attending: Emergency Medicine | Admitting: Emergency Medicine

## 2012-11-25 ENCOUNTER — Encounter (HOSPITAL_COMMUNITY): Payer: Self-pay | Admitting: Emergency Medicine

## 2012-11-25 DIAGNOSIS — N186 End stage renal disease: Secondary | ICD-10-CM | POA: Insufficient documentation

## 2012-11-25 DIAGNOSIS — Z8739 Personal history of other diseases of the musculoskeletal system and connective tissue: Secondary | ICD-10-CM | POA: Insufficient documentation

## 2012-11-25 DIAGNOSIS — E119 Type 2 diabetes mellitus without complications: Secondary | ICD-10-CM | POA: Insufficient documentation

## 2012-11-25 DIAGNOSIS — F17200 Nicotine dependence, unspecified, uncomplicated: Secondary | ICD-10-CM | POA: Insufficient documentation

## 2012-11-25 DIAGNOSIS — T148XXA Other injury of unspecified body region, initial encounter: Secondary | ICD-10-CM | POA: Insufficient documentation

## 2012-11-25 DIAGNOSIS — M7989 Other specified soft tissue disorders: Secondary | ICD-10-CM

## 2012-11-25 DIAGNOSIS — E785 Hyperlipidemia, unspecified: Secondary | ICD-10-CM | POA: Insufficient documentation

## 2012-11-25 DIAGNOSIS — J4489 Other specified chronic obstructive pulmonary disease: Secondary | ICD-10-CM | POA: Insufficient documentation

## 2012-11-25 DIAGNOSIS — Z87448 Personal history of other diseases of urinary system: Secondary | ICD-10-CM | POA: Insufficient documentation

## 2012-11-25 DIAGNOSIS — S2239XA Fracture of one rib, unspecified side, initial encounter for closed fracture: Secondary | ICD-10-CM | POA: Insufficient documentation

## 2012-11-25 DIAGNOSIS — I2510 Atherosclerotic heart disease of native coronary artery without angina pectoris: Secondary | ICD-10-CM | POA: Insufficient documentation

## 2012-11-25 DIAGNOSIS — Y929 Unspecified place or not applicable: Secondary | ICD-10-CM | POA: Insufficient documentation

## 2012-11-25 DIAGNOSIS — Z8669 Personal history of other diseases of the nervous system and sense organs: Secondary | ICD-10-CM | POA: Insufficient documentation

## 2012-11-25 DIAGNOSIS — Z8719 Personal history of other diseases of the digestive system: Secondary | ICD-10-CM | POA: Insufficient documentation

## 2012-11-25 DIAGNOSIS — Z872 Personal history of diseases of the skin and subcutaneous tissue: Secondary | ICD-10-CM | POA: Insufficient documentation

## 2012-11-25 DIAGNOSIS — Z87442 Personal history of urinary calculi: Secondary | ICD-10-CM | POA: Insufficient documentation

## 2012-11-25 DIAGNOSIS — M109 Gout, unspecified: Secondary | ICD-10-CM | POA: Insufficient documentation

## 2012-11-25 DIAGNOSIS — J449 Chronic obstructive pulmonary disease, unspecified: Secondary | ICD-10-CM | POA: Insufficient documentation

## 2012-11-25 DIAGNOSIS — Z79899 Other long term (current) drug therapy: Secondary | ICD-10-CM | POA: Insufficient documentation

## 2012-11-25 DIAGNOSIS — M79609 Pain in unspecified limb: Secondary | ICD-10-CM

## 2012-11-25 DIAGNOSIS — Z992 Dependence on renal dialysis: Secondary | ICD-10-CM | POA: Insufficient documentation

## 2012-11-25 DIAGNOSIS — K219 Gastro-esophageal reflux disease without esophagitis: Secondary | ICD-10-CM | POA: Insufficient documentation

## 2012-11-25 DIAGNOSIS — S2231XA Fracture of one rib, right side, initial encounter for closed fracture: Secondary | ICD-10-CM

## 2012-11-25 DIAGNOSIS — W1789XA Other fall from one level to another, initial encounter: Secondary | ICD-10-CM | POA: Insufficient documentation

## 2012-11-25 DIAGNOSIS — L0291 Cutaneous abscess, unspecified: Secondary | ICD-10-CM | POA: Insufficient documentation

## 2012-11-25 DIAGNOSIS — Y939 Activity, unspecified: Secondary | ICD-10-CM | POA: Insufficient documentation

## 2012-11-25 DIAGNOSIS — L0390 Cellulitis, unspecified: Secondary | ICD-10-CM

## 2012-11-25 DIAGNOSIS — I120 Hypertensive chronic kidney disease with stage 5 chronic kidney disease or end stage renal disease: Secondary | ICD-10-CM | POA: Insufficient documentation

## 2012-11-25 DIAGNOSIS — Z7901 Long term (current) use of anticoagulants: Secondary | ICD-10-CM | POA: Insufficient documentation

## 2012-11-25 DIAGNOSIS — R109 Unspecified abdominal pain: Secondary | ICD-10-CM | POA: Insufficient documentation

## 2012-11-25 DIAGNOSIS — Z8679 Personal history of other diseases of the circulatory system: Secondary | ICD-10-CM | POA: Insufficient documentation

## 2012-11-25 DIAGNOSIS — R059 Cough, unspecified: Secondary | ICD-10-CM | POA: Insufficient documentation

## 2012-11-25 DIAGNOSIS — Z794 Long term (current) use of insulin: Secondary | ICD-10-CM | POA: Insufficient documentation

## 2012-11-25 DIAGNOSIS — R05 Cough: Secondary | ICD-10-CM | POA: Insufficient documentation

## 2012-11-25 DIAGNOSIS — I4891 Unspecified atrial fibrillation: Secondary | ICD-10-CM | POA: Insufficient documentation

## 2012-11-25 DIAGNOSIS — I5022 Chronic systolic (congestive) heart failure: Secondary | ICD-10-CM | POA: Insufficient documentation

## 2012-11-25 LAB — BASIC METABOLIC PANEL
Calcium: 9.2 mg/dL (ref 8.4–10.5)
GFR calc non Af Amer: 12 mL/min — ABNORMAL LOW (ref >90)
Glucose, Bld: 302 mg/dL — ABNORMAL HIGH (ref 70–99)
Sodium: 133 mEq/L — ABNORMAL LOW (ref 135–145)

## 2012-11-25 LAB — CBC WITH DIFFERENTIAL/PLATELET
Basophils Absolute: 0.2 10*3/uL — ABNORMAL HIGH (ref 0.0–0.1)
Eosinophils Absolute: 0.3 10*3/uL (ref 0.0–0.7)
Eosinophils Relative: 2 % (ref 0–5)
MCH: 29.5 pg (ref 26.0–34.0)
MCV: 88.8 fL (ref 78.0–100.0)
Platelets: 455 10*3/uL — ABNORMAL HIGH (ref 150–400)
RDW: 15.6 % — ABNORMAL HIGH (ref 11.5–15.5)

## 2012-11-25 LAB — PROTIME-INR: INR: 3.2 — ABNORMAL HIGH (ref 0.00–1.49)

## 2012-11-25 MED ORDER — HYDROCODONE-ACETAMINOPHEN 5-325 MG PO TABS: 2.0000 | ORAL_TABLET | ORAL | Status: DC | PRN

## 2012-11-25 MED ORDER — VANCOMYCIN HCL IN DEXTROSE 1-5 GM/200ML-% IV SOLN
1000.0000 mg | Freq: Once | INTRAVENOUS | Status: AC
  Administered 2012-11-25: 1000 mg via INTRAVENOUS
  Filled 2012-11-25: qty 200

## 2012-11-25 MED ORDER — HYDROCODONE-ACETAMINOPHEN 5-325 MG PO TABS
2.0000 | ORAL_TABLET | Freq: Once | ORAL | Status: AC
  Administered 2012-11-25: 2 via ORAL
  Filled 2012-11-25: qty 2

## 2012-11-25 NOTE — ED Notes (Signed)
Per EMS: pt fell off tractor last Friday. Pt went to the beach last week. Pt noticed today that place on left calf has increased in size in pain and swelling. Pt c/o pain on right flank after falling off tractor, no deformity noted. Pt denies LOC in fall. Pt dialysis pt and was treated today but was not feeling well at center and was brought here. Leg is warm with bruising and swelling noted. Pt coughing up blood-tinged mucus twice. Pt denies n/v/d and denies fever. Pt on coumadin. Pt hx of chronic back pain. VSS BP 134/72 pulse 104 RR 18 CBG 254 SPO2 97% RA . 20 G in right hand. 5/10 pain when not moving. Alert and oriented.

## 2012-11-25 NOTE — ED Notes (Signed)
Pt returned from CT °

## 2012-11-25 NOTE — Progress Notes (Signed)
VASCULAR LAB PRELIMINARY  PRELIMINARY  PRELIMINARY  PRELIMINARY  Left lower extremity venous Doppler completed.    Preliminary report:  There is no DVT or SVT noted in the left lower extremity.  There is a large hematoma in the left proximal calf, no bleeding noted.  Jerrik Housholder, RVT 11/25/2012, 1:45 PM    \

## 2012-11-25 NOTE — ED Provider Notes (Signed)
History     CSN: [id number]  Arrival date & time 11/25/12  1230   First MD Initiated Contact with Patient 11/25/12 1231      Chief Complaint  Patient presents with  . Leg Injury  . Flank Pain  . Pain    (Consider location/radiation/quality/duration/timing/severity/associated sxs/prior treatment) HPI Comments: Patient comes to the ER for increasing pain and swelling of the left lower leg after injury. Patient also complaining of right rib pain. Patient reports that he fell off of his tractor a week ago. He hit the left calf area on the shelter when he fell and landed on his right side. No head injury or loss of consciousness. Denies neck back pain. He has had pain in the right rib cage when he moves in certain positions and if he takes a deep breath. He has had increasing cough, productive of dark brown sputum. He has not had fever. He does not feel short of breath.  Patient reports that the area on the left leg started as a small swollen area with a bruise. The bruise has gotten larger. He reports that he is on Coumadin and thinks that there is some bleeding under the surface. Pain is worsening and it hurts when he tries to walk.  Patient is a dialysis patient. He comes to [place] from dialysis center, he did finish his dialysis today.  Patient is a 62 y.o. male presenting with flank pain.  Flank Pain Pertinent negatives include no abdominal pain, no headaches and no shortness of breath.    Past Medical History  Diagnosis Date  . Chronic kidney disease     dialysis t,th,sat  . GERD (gastroesophageal reflux disease)   . Hypertension   . Neuromuscular disorder     neurophathy  . Atrial fibrillation     chronically anticoagulated with coumadin  . Hyperlipidemia   . Atrial flutter 07/07/2009    s/p ablation  . DIABETES MELLITUS, TYPE II 08/01/2008  . GOUT 08/01/2008  . ERECTILE DYSFUNCTION 08/01/2008  . PERIPHERAL NEUROPATHY 08/01/2008  . CORONARY ARTERY DISEASE 08/01/2008    non  obstructive per cath 1/10; Dr. Juanda Chance  . 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 Gastroparesis 08/01/2008  . End stage renal failure on dialysis 08/01/2008  . NEPHROLITHIASIS, HX OF 08/01/2008  . Chronic systolic heart failure 06/21/2010  . COPD 07/21/2010  . Diverticulosis of colon (without mention of hemorrhage) 10/28/2010  . Benign neoplasm of colon 10/28/2010  . Hemorrhoids, internal 10/28/2010  . Renal cyst     bilateral  . Second degree Mobitz I AV block     asymptomatic  . Cyst     infected cyst on back   . Muscle spasm of back     back muscle spasms and pt is unable to lay flat   . Abscess of back     lower middle portion of back    Past Surgical History  Procedure Laterality Date  . Atrial ablation surgery  07-2008, 07/08/09    Dr Johney Frame  . Right foot  09-2009    infection  . Colonoscopy  38yrs ago    Dr.Orr  . Av fistula placement      left upper arm  . S/p right arm surgery after fracture    . Pilonidal cyst excision      s/p    Family History  Problem Relation Age of Onset  . Diabetes Mother   . Diabetes Maternal Grandmother   .  Diabetes Paternal Grandmother   . Cancer Neg Hx     no FH of Colon Cancer    History  Substance Use Topics  . Smoking status: Current Every Day Smoker -- 0.50 packs/day for 40 years    Types: Cigarettes  . Smokeless tobacco: Never Used  . Alcohol Use: No      Review of Systems  HENT: Negative for neck pain.   Respiratory: Positive for cough. Negative for shortness of breath.   Gastrointestinal: Negative for abdominal pain.  Genitourinary: Positive for flank pain.  Musculoskeletal: Negative for back pain.  Skin: Positive for wound.  Neurological: Negative for headaches.  All other systems reviewed and are negative.    Allergies  Review of patient's allergies indicates no known allergies.  Home Medications   Current Outpatient Rx  Name  Route  Sig  Dispense  Refill  . allopurinol (ZYLOPRIM) 100 MG  tablet   Oral   Take 1 tablet by mouth daily.          Marland Kitchen atorvastatin (LIPITOR) 40 MG tablet   Oral   Take 1 tablet (40 mg total) by mouth daily.   30 tablet   6   . B Complex-C-Folic Acid (DIALYVITE PO)   Oral   Take 100 mg by mouth daily.           . carvedilol (COREG) 12.5 MG tablet   Oral   Take 12.5 mg by mouth daily.           Marland Kitchen COUMADIN 5 MG tablet   Oral   Take 1 tablet (5 mg total) by mouth as directed.   40 tablet   3     Dispense as written.   . ethyl chloride spray               . FOSRENOL 1000 MG chewable tablet   Oral   Chew 1 tablet by mouth 3 (three) times daily with meals.          . gabapentin (NEURONTIN) 300 MG capsule   Oral   Take 1 capsule (300 mg total) by mouth at bedtime.   30 capsule   11   . insulin aspart (NOVOLOG FLEXPEN) 100 UNIT/ML injection   Subcutaneous   Inject 30 Units into the skin 3 (three) times daily before meals.   90 mL   3   . insulin glargine (LANTUS) 100 UNIT/ML injection   Subcutaneous   Inject 40 Units into the skin at bedtime.          . Insulin Syringe-Needle U-100 (BD INSULIN SYRINGE ULTRAFINE) 31G X 5/16" 0.5 ML MISC      1cc use as directed          . omega-3 acid ethyl esters (LOVAZA) 1 G capsule   Oral   Take 3 capsules by mouth daily.         Marland Kitchen omeprazole (PRILOSEC) 20 MG capsule      TAKE ONE CAPSULE AT BEDTIME   30 capsule   5   . TEMAZEPAM PO   Oral   Take by mouth at bedtime.             BP 134/71  Pulse 113  Temp(Src) 99.7 F (37.6 C) (Oral)  Resp 22  SpO2 100%  Physical Exam  Constitutional: He is oriented to person, place, and time. He appears well-developed and well-nourished. No distress.  HENT:  Head: Normocephalic and atraumatic.  Right Ear: Hearing normal.  Left  Ear: Hearing normal.  Nose: Nose normal.  Mouth/Throat: Oropharynx is clear and moist and mucous membranes are normal.  Eyes: Conjunctivae and EOM are normal. Pupils are equal, round, and  reactive to light.  Neck: Normal range of motion. Neck supple.  Cardiovascular: Regular rhythm, S1 normal and S2 normal.  Exam reveals no gallop and no friction rub.   No murmur heard. Pulmonary/Chest: Effort normal and breath sounds normal. No respiratory distress. He exhibits no tenderness and no crepitus.    Abdominal: Soft. Normal appearance and bowel sounds are normal. There is no hepatosplenomegaly. There is no tenderness. There is no rebound, no guarding, no tenderness at McBurney's point and negative Murphy's sign. No hernia.  Musculoskeletal: Normal range of motion.  Neurological: He is alert and oriented to person, place, and time. He has normal strength. No cranial nerve deficit or sensory deficit. Coordination normal. GCS eye subscore is 4. GCS verbal subscore is 5. GCS motor subscore is 6.  Skin: Skin is warm, dry and intact. No rash noted. No cyanosis.     Psychiatric: He has a normal mood and affect. His speech is normal and behavior is normal. Thought content normal.    ED Course  Procedures (including critical care time)  Labs Reviewed  CBC WITH DIFFERENTIAL - Abnormal; Notable for the following:    WBC 14.1 (*)    RBC 3.39 (*)    Hemoglobin 10.0 (*)    HCT 30.1 (*)    RDW 15.6 (*)    Platelets 455 (*)    Neutrophils Relative % 79 (*)    Neutro Abs 11.1 (*)    Lymphocytes Relative 11 (*)    Basophils Absolute 0.2 (*)    All other components within normal limits  BASIC METABOLIC PANEL - Abnormal; Notable for the following:    Sodium 133 (*)    Chloride 88 (*)    Glucose, Bld 302 (*)    BUN 31 (*)    Creatinine, Ser 4.72 (*)    GFR calc non Af Amer 12 (*)    GFR calc Af Amer 14 (*)    All other components within normal limits  PROTIME-INR - Abnormal; Notable for the following:    Prothrombin Time 31.0 (*)    INR 3.20 (*)    All other components within normal limits   Dg Ribs Unilateral W/chest Right  11/25/2012   *RADIOLOGY REPORT*  Clinical Data: Fall 1  week ago, flank pain  RIGHT RIBS AND CHEST - 3+ VIEW  Comparison: 11/13/2010  Findings: Three views right wrist submitted.  No acute infiltrate or pulmonary edema.  Mild displaced fracture of the right third and fourth ribs. Minimal displaced fracture of the right fifth rib.  No diagnostic pneumothorax.  IMPRESSION: Mild displaced fracture of the right third and fourth ribs. Minimal displaced fracture of the right fifth rib.  No diagnostic pneumothorax.   Original Report Authenticated By: Natasha Mead, M.D.   Ct Head Wo Contrast  11/25/2012   *RADIOLOGY REPORT*  Clinical Data: Fall off tractor.  Head injury.  Confusion. Diabetes and renal failure.  CT HEAD WITHOUT CONTRAST  Technique:  Contiguous axial images were obtained from the base of the skull through the vertex without contrast.  Comparison: 08/26/2011  Findings: There is no evidence of intracranial hemorrhage, brain edema or other signs of acute infarction.  There is no evidence of intracranial mass lesion or mass effect.  No abnormal extra-axial fluid collections are identified.  Ventricles are normal in  size.  Mild chronic small vessel disease is stable in appearance.  No evidence of skull fracture or pneumocephalus.  Chronic pansinusitis again noted.  IMPRESSION:  1.  No acute intracranial findings. 2.  Stable mild chronic small vessel disease. 3.  Chronic pansinusitis.   Original Report Authenticated By: Myles Rosenthal, M.D.     Diagnosis: 1. Multiple right rib fractures. Hematoma left leg, possible cellulitis    MDM  Patient presents to the ER for evaluation of pain in the right rib cage and weak after a fall. Patient has had some increased cough and therefore was referred to the ER by dialysis. X-ray does not show any evidence of pneumonia, hemothorax, pneumothorax. He does have multiple rib fractures as outlined above. Patient will be given analgesia and was counseled to return to the ER for fever or worsening shortness of breath.  Patient has  a hematoma on the left calf. Venous duplex was performed and there is no DVT. There is a small hematoma there. This is secondary to his Coumadin use. There is some mild erythema surrounding the area and therefore patient was administered IV vancomycin. As he is a dialysis patient, this should last until his dialysis session on Tuesday and he can be reevaluated at that time for further antibiotic coverage if needed.  Reports that last week he had a brief episode where he seemed kind of confused. His speech was not slurred and there was no focal weakness or drooping, but there was that he was saying do not make sense. This briefly resolved and has not recurred again. It's not clear what the cause of this was. CAT scan was performed and he does not have any intracranial injury or other abnormality seen. He is to follow up with his primary doctor.        Gilda Crease, MD 11/25/12 [phone]

## 2012-11-25 NOTE — ED Notes (Signed)
NAD noted at time d/c papers given

## 2012-11-25 NOTE — ED Notes (Signed)
Patient transported to CT 

## 2012-11-27 ENCOUNTER — Ambulatory Visit (INDEPENDENT_AMBULATORY_CARE_PROVIDER_SITE_OTHER): Payer: 59 | Admitting: Endocrinology

## 2012-11-27 VITALS — BP 128/76 | HR 100 | Ht 64.0 in | Wt 257.0 lb

## 2012-11-27 DIAGNOSIS — E119 Type 2 diabetes mellitus without complications: Secondary | ICD-10-CM

## 2012-11-27 MED ORDER — HYDROCODONE-ACETAMINOPHEN 5-325 MG PO TABS: 2.0000 | ORAL_TABLET | ORAL | Status: DC | PRN

## 2012-11-27 NOTE — Progress Notes (Signed)
Subjective:    Patient ID: [name]., male    DOB: 09-May-1951, 62 y.o.   MRN: [id number]  HPI Pt returns for f/u of insulin-requiring DM (dx'ed 1983; he has moderate painful neuropathy of the lower extremities; he has associated foot ulcer, retinopathy, gastroparesis, peripheral sensory neuropathy, CAD, and renal failure).  He says he again did not increase the insulin as advised.  no cbg record, but pt says it is approx 200.  There is no trend throughout the day.   He was seen in ER a few days ago, after he fell from a tractor.  He fx several right ribs, and abraded his left leg.  He was given vancocin in the ER. Past Medical History  Diagnosis Date  . Chronic kidney disease     dialysis t,th,sat  . GERD (gastroesophageal reflux disease)   . Hypertension   . Neuromuscular disorder     neurophathy  . Atrial fibrillation     chronically anticoagulated with coumadin  . Hyperlipidemia   . Atrial flutter 07/07/2009    s/p ablation  . DIABETES MELLITUS, TYPE II 08/01/2008  . GOUT 08/01/2008  . ERECTILE DYSFUNCTION 08/01/2008  . PERIPHERAL NEUROPATHY 08/01/2008  . CORONARY ARTERY DISEASE 08/01/2008    non obstructive per cath 1/10; Dr. Juanda Chance  . 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 Gastroparesis 08/01/2008  . End stage renal failure on dialysis 08/01/2008  . NEPHROLITHIASIS, HX OF 08/01/2008  . Chronic systolic heart failure 06/21/2010  . COPD 07/21/2010  . Diverticulosis of colon (without mention of hemorrhage) 10/28/2010  . Benign neoplasm of colon 10/28/2010  . Hemorrhoids, internal 10/28/2010  . Renal cyst     bilateral  . Second degree Mobitz I AV block     asymptomatic  . Cyst     infected cyst on back   . Muscle spasm of back     back muscle spasms and pt is unable to lay flat   . Abscess of back     lower middle portion of back    Past Surgical History  Procedure Laterality Date  . Atrial ablation surgery  07-2008, 07/08/09    Dr Johney Frame  . Right  foot  09-2009    infection  . Colonoscopy  98yrs ago    Dr.Orr  . Av fistula placement      left upper arm  . S/p right arm surgery after fracture    . Pilonidal cyst excision      s/p    History   Social History  . Marital Status: Married    Spouse Name: N/A    Number of Children: 3  . Years of Education: N/A   Occupational History  . Sales Rep    Social History Main Topics  . Smoking status: Current Every Day Smoker -- 0.50 packs/day for 40 years    Types: Cigarettes  . Smokeless tobacco: Never Used  . Alcohol Use: No  . Drug Use: No  . Sexually Active:    Other Topics Concern  . 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[employer]          Current Outpatient Prescriptions on File Prior to Visit  Medication Sig Dispense Refill  . allopurinol (ZYLOPRIM) 100 MG tablet Take 1 tablet by mouth daily.       . B Complex-C-Folic Acid (DIALYVITE PO) Take 100 mg by mouth daily.        Marland Kitchen  COUMADIN 5 MG tablet Take 1 tablet (5 mg total) by mouth as directed.  40 tablet  3  . ethyl chloride spray       . FOSRENOL 1000 MG chewable tablet Chew 1 tablet by mouth 3 (three) times daily with meals.       . gabapentin (NEURONTIN) 300 MG capsule Take 1 capsule (300 mg total) by mouth at bedtime.  30 capsule  11  . insulin aspart (NOVOLOG FLEXPEN) 100 UNIT/ML injection Inject 30 Units into the skin 3 (three) times daily before meals.  90 mL  3  . insulin glargine (LANTUS) 100 UNIT/ML injection Inject 40 Units into the skin at bedtime.       . Insulin Syringe-Needle U-100 (BD INSULIN SYRINGE ULTRAFINE) 31G X 5/16" 0.5 ML MISC 1cc use as directed       . omega-3 acid ethyl esters (LOVAZA) 1 G capsule Take 3 capsules by mouth daily.      Marland Kitchen omeprazole (PRILOSEC) 20 MG capsule TAKE ONE CAPSULE AT BEDTIME  30 capsule  5  . TEMAZEPAM PO Take by mouth at bedtime.         No current facility-administered medications on file prior to visit.    No Known Allergies Family  History  Problem Relation Age of Onset  . Diabetes Mother   . Diabetes Maternal Grandmother   . Diabetes Paternal Grandmother   . Cancer Neg Hx     no FH of Colon Cancer   BP 128/76  Pulse 100  Ht 5\' 4"  (1.626 m)  Wt 257 lb (116.574 kg)  BMI 44.09 kg/m2  SpO2 98%  Review of Systems Denies fever and hypoglycemia.      Objective:   Physical Exam VITAL SIGNS:  See vs page GENERAL: no distress LUNGS:  Clear to auscultation.   Chest wall is tender at the right side. Right leg: 2 cm ulcer at the left calf.  Slight erythema at the entire left leg (pt says unchanged over the past 2 days).     Assessment & Plan:  DM: therapy limited by noncompliance.  i'll do the best i can. Rib fxs, due to fall Right leg cellulitis, unchanged.  He is OK on vancocin for now, as he has renal failure.  However, he'll need f/u with dr duda.

## 2012-11-27 NOTE — Patient Instructions (Addendum)
You will get better much faster if you elevate your left leg above the rest of your body. Please call Dr [name], to move-up your next appointment.  Please come back for a follow-up appointment in 1-2 weeks.  Here is a refill of your pain medicine.   Please increase your insulin to 40 units daily.

## 2012-12-07 ENCOUNTER — Encounter: Payer: Self-pay | Admitting: Endocrinology

## 2012-12-07 LAB — PROTIME-INR: INR: 4.7 — AB (ref 0.9–1.1)

## 2012-12-08 ENCOUNTER — Ambulatory Visit: Payer: 59 | Admitting: Endocrinology

## 2012-12-08 ENCOUNTER — Ambulatory Visit (INDEPENDENT_AMBULATORY_CARE_PROVIDER_SITE_OTHER): Payer: 59 | Admitting: Cardiovascular Disease

## 2012-12-08 DIAGNOSIS — I4891 Unspecified atrial fibrillation: Secondary | ICD-10-CM

## 2012-12-08 DIAGNOSIS — Z7901 Long term (current) use of anticoagulants: Secondary | ICD-10-CM

## 2012-12-25 ENCOUNTER — Ambulatory Visit: Payer: 59 | Attending: Neurology | Admitting: Physical Therapy

## 2012-12-25 ENCOUNTER — Ambulatory Visit (INDEPENDENT_AMBULATORY_CARE_PROVIDER_SITE_OTHER): Payer: 59 | Admitting: *Deleted

## 2012-12-25 DIAGNOSIS — IMO0001 Reserved for inherently not codable concepts without codable children: Secondary | ICD-10-CM | POA: Insufficient documentation

## 2012-12-25 DIAGNOSIS — Z992 Dependence on renal dialysis: Secondary | ICD-10-CM | POA: Insufficient documentation

## 2012-12-25 DIAGNOSIS — I4891 Unspecified atrial fibrillation: Secondary | ICD-10-CM

## 2012-12-25 DIAGNOSIS — N186 End stage renal disease: Secondary | ICD-10-CM | POA: Insufficient documentation

## 2012-12-25 DIAGNOSIS — E1142 Type 2 diabetes mellitus with diabetic polyneuropathy: Secondary | ICD-10-CM | POA: Insufficient documentation

## 2012-12-25 DIAGNOSIS — R209 Unspecified disturbances of skin sensation: Secondary | ICD-10-CM | POA: Insufficient documentation

## 2012-12-25 DIAGNOSIS — R269 Unspecified abnormalities of gait and mobility: Secondary | ICD-10-CM | POA: Insufficient documentation

## 2012-12-25 DIAGNOSIS — Z7901 Long term (current) use of anticoagulants: Secondary | ICD-10-CM

## 2012-12-25 DIAGNOSIS — E1149 Type 2 diabetes mellitus with other diabetic neurological complication: Secondary | ICD-10-CM | POA: Insufficient documentation

## 2012-12-28 ENCOUNTER — Encounter (HOSPITAL_COMMUNITY): Payer: Self-pay

## 2012-12-28 ENCOUNTER — Encounter (HOSPITAL_COMMUNITY): Payer: Self-pay | Admitting: *Deleted

## 2012-12-28 ENCOUNTER — Other Ambulatory Visit (HOSPITAL_COMMUNITY): Payer: Self-pay | Admitting: Orthopedic Surgery

## 2012-12-28 NOTE — Progress Notes (Signed)
Pt denies SOB and chest pain. However, pt states that he has broken ribs. Pt under the care of Dr. Johney Frame, cardiologist at [facility]. Pt states that he recently had a chest x ray but unsure of where and when. Pt advised to stop taking Aspirin, Coumadin, Plavix, Effient, and herbal medications. Do not take any NSAIDs ie: Ibuprofen, Advil, Naproxen or any medication containing Aspirin.

## 2012-12-29 ENCOUNTER — Encounter (HOSPITAL_COMMUNITY): Payer: Self-pay | Admitting: Anesthesiology

## 2012-12-29 ENCOUNTER — Ambulatory Visit: Payer: 59 | Admitting: Physical Therapy

## 2012-12-29 ENCOUNTER — Inpatient Hospital Stay (HOSPITAL_COMMUNITY)
Admission: RE | Admit: 2012-12-29 | Discharge: 2013-01-01 | DRG: 573 | Disposition: A | Discharge status: Home / Self Care | Payer: 59 | Source: Ambulatory Visit | Attending: Orthopedic Surgery | Admitting: Orthopedic Surgery

## 2012-12-29 ENCOUNTER — Inpatient Hospital Stay (HOSPITAL_COMMUNITY): Payer: 59 | Admitting: Anesthesiology

## 2012-12-29 ENCOUNTER — Inpatient Hospital Stay (HOSPITAL_COMMUNITY): Payer: 59

## 2012-12-29 ENCOUNTER — Encounter (HOSPITAL_COMMUNITY)
Admission: RE | Disposition: A | Discharge status: Home / Self Care | Payer: Self-pay | Source: Ambulatory Visit | Attending: Orthopedic Surgery

## 2012-12-29 ENCOUNTER — Encounter (HOSPITAL_COMMUNITY): Payer: Self-pay | Admitting: *Deleted

## 2012-12-29 DIAGNOSIS — J4489 Other specified chronic obstructive pulmonary disease: Secondary | ICD-10-CM | POA: Diagnosis present

## 2012-12-29 DIAGNOSIS — I4430 Unspecified atrioventricular block: Secondary | ICD-10-CM | POA: Diagnosis present

## 2012-12-29 DIAGNOSIS — F17200 Nicotine dependence, unspecified, uncomplicated: Secondary | ICD-10-CM | POA: Diagnosis present

## 2012-12-29 DIAGNOSIS — Z7901 Long term (current) use of anticoagulants: Secondary | ICD-10-CM

## 2012-12-29 DIAGNOSIS — Z794 Long term (current) use of insulin: Secondary | ICD-10-CM

## 2012-12-29 DIAGNOSIS — S81802A Unspecified open wound, left lower leg, initial encounter: Secondary | ICD-10-CM

## 2012-12-29 DIAGNOSIS — J449 Chronic obstructive pulmonary disease, unspecified: Secondary | ICD-10-CM | POA: Diagnosis present

## 2012-12-29 DIAGNOSIS — Z992 Dependence on renal dialysis: Secondary | ICD-10-CM

## 2012-12-29 DIAGNOSIS — Z79899 Other long term (current) drug therapy: Secondary | ICD-10-CM

## 2012-12-29 DIAGNOSIS — E1149 Type 2 diabetes mellitus with other diabetic neurological complication: Secondary | ICD-10-CM | POA: Diagnosis present

## 2012-12-29 DIAGNOSIS — I4891 Unspecified atrial fibrillation: Secondary | ICD-10-CM | POA: Diagnosis present

## 2012-12-29 DIAGNOSIS — I5022 Chronic systolic (congestive) heart failure: Secondary | ICD-10-CM | POA: Diagnosis present

## 2012-12-29 DIAGNOSIS — M109 Gout, unspecified: Secondary | ICD-10-CM | POA: Diagnosis present

## 2012-12-29 DIAGNOSIS — L97909 Non-pressure chronic ulcer of unspecified part of unspecified lower leg with unspecified severity: Principal | ICD-10-CM | POA: Diagnosis present

## 2012-12-29 DIAGNOSIS — I4892 Unspecified atrial flutter: Secondary | ICD-10-CM | POA: Diagnosis present

## 2012-12-29 DIAGNOSIS — E785 Hyperlipidemia, unspecified: Secondary | ICD-10-CM | POA: Diagnosis present

## 2012-12-29 DIAGNOSIS — N186 End stage renal disease: Secondary | ICD-10-CM | POA: Diagnosis present

## 2012-12-29 DIAGNOSIS — I120 Hypertensive chronic kidney disease with stage 5 chronic kidney disease or end stage renal disease: Secondary | ICD-10-CM | POA: Diagnosis present

## 2012-12-29 DIAGNOSIS — K219 Gastro-esophageal reflux disease without esophagitis: Secondary | ICD-10-CM | POA: Diagnosis present

## 2012-12-29 DIAGNOSIS — K5730 Diverticulosis of large intestine without perforation or abscess without bleeding: Secondary | ICD-10-CM | POA: Diagnosis present

## 2012-12-29 DIAGNOSIS — D126 Benign neoplasm of colon, unspecified: Secondary | ICD-10-CM | POA: Diagnosis present

## 2012-12-29 DIAGNOSIS — I2510 Atherosclerotic heart disease of native coronary artery without angina pectoris: Secondary | ICD-10-CM | POA: Diagnosis present

## 2012-12-29 DIAGNOSIS — E1142 Type 2 diabetes mellitus with diabetic polyneuropathy: Secondary | ICD-10-CM | POA: Diagnosis present

## 2012-12-29 HISTORY — PX: I & D EXTREMITY: SHX5045

## 2012-12-29 LAB — COMPREHENSIVE METABOLIC PANEL
ALT: 10 U/L (ref 0–53)
AST: 12 U/L (ref 0–37)
Albumin: 3.8 g/dL (ref 3.5–5.2)
Alkaline Phosphatase: 90 U/L (ref 39–117)
Chloride: 95 mEq/L — ABNORMAL LOW (ref 96–112)
Potassium: 4.6 mEq/L (ref 3.5–5.1)
Sodium: 139 mEq/L (ref 135–145)
Total Bilirubin: 0.4 mg/dL (ref 0.3–1.2)
Total Protein: 8.2 g/dL (ref 6.0–8.3)

## 2012-12-29 LAB — CBC
HCT: 34.3 % — ABNORMAL LOW (ref 39.0–52.0)
MCHC: 31.8 g/dL (ref 30.0–36.0)
RDW: 16.8 % — ABNORMAL HIGH (ref 11.5–15.5)
WBC: 11.1 10*3/uL — ABNORMAL HIGH (ref 4.0–10.5)

## 2012-12-29 LAB — APTT: aPTT: 37 seconds (ref 24–37)

## 2012-12-29 SURGERY — IRRIGATION AND DEBRIDEMENT EXTREMITY
Anesthesia: General | Site: Leg Lower | Laterality: Left | Wound class: Dirty or Infected

## 2012-12-29 MED ORDER — ONDANSETRON HCL 4 MG PO TABS: 4.0000 mg | ORAL_TABLET | Freq: Four times a day (QID) | ORAL | Status: DC | PRN

## 2012-12-29 MED ORDER — CEFAZOLIN SODIUM-DEXTROSE 2-3 GM-% IV SOLR: 2.0000 g | INTRAVENOUS | Status: DC

## 2012-12-29 MED ORDER — FENTANYL CITRATE 0.05 MG/ML IJ SOLN
INTRAMUSCULAR | Status: DC | PRN
  Administered 2012-12-29: 50 ug via INTRAVENOUS

## 2012-12-29 MED ORDER — OXYCODONE-ACETAMINOPHEN 5-325 MG PO TABS
1.0000 | ORAL_TABLET | ORAL | Status: DC | PRN
  Administered 2012-12-29 – 2012-12-30 (×5): 2 via ORAL
  Filled 2012-12-29 (×4): qty 2

## 2012-12-29 MED ORDER — SODIUM CHLORIDE 0.9 % IV SOLN
INTRAVENOUS | Status: DC
  Administered 2012-12-29: 10 mL/h via INTRAVENOUS

## 2012-12-29 MED ORDER — LOPERAMIDE HCL 2 MG PO CAPS
2.0000 mg | ORAL_CAPSULE | Freq: Four times a day (QID) | ORAL | Status: DC | PRN
  Filled 2012-12-29: qty 1

## 2012-12-29 MED ORDER — CEFAZOLIN SODIUM-DEXTROSE 2-3 GM-% IV SOLR
INTRAVENOUS | Status: AC
  Administered 2012-12-29: 2 g via INTRAVENOUS
  Filled 2012-12-29: qty 50

## 2012-12-29 MED ORDER — TEMAZEPAM 15 MG PO CAPS
30.0000 mg | ORAL_CAPSULE | Freq: Every evening | ORAL | Status: DC | PRN
  Administered 2012-12-30: 15 mg via ORAL
  Administered 2012-12-31: 30 mg via ORAL
  Filled 2012-12-29: qty 2
  Filled 2012-12-29: qty 1

## 2012-12-29 MED ORDER — ONDANSETRON HCL 4 MG/2ML IJ SOLN
INTRAMUSCULAR | Status: DC | PRN
  Administered 2012-12-29: 4 mg via INTRAVENOUS

## 2012-12-29 MED ORDER — OXYCODONE HCL 5 MG PO TABS: 5.0000 mg | ORAL_TABLET | Freq: Once | ORAL | Status: DC | PRN

## 2012-12-29 MED ORDER — WARFARIN SODIUM 5 MG PO TABS: 5.0000 mg | ORAL_TABLET | ORAL | Status: DC

## 2012-12-29 MED ORDER — ONDANSETRON HCL 4 MG/2ML IJ SOLN: 4.0000 mg | Freq: Four times a day (QID) | INTRAMUSCULAR | Status: DC | PRN

## 2012-12-29 MED ORDER — INSULIN ASPART 100 UNIT/ML ~~LOC~~ SOLN
3.0000 [IU] | Freq: Three times a day (TID) | SUBCUTANEOUS | Status: DC
  Administered 2012-12-30 – 2013-01-01 (×7): 3 [IU] via SUBCUTANEOUS

## 2012-12-29 MED ORDER — FENTANYL CITRATE 0.05 MG/ML IJ SOLN
25.0000 ug | INTRAMUSCULAR | Status: DC | PRN
  Administered 2012-12-29: 50 ug via INTRAVENOUS

## 2012-12-29 MED ORDER — GABAPENTIN 300 MG PO CAPS
300.0000 mg | ORAL_CAPSULE | Freq: Every day | ORAL | Status: DC
  Administered 2012-12-29 – 2012-12-31 (×3): 300 mg via ORAL
  Filled 2012-12-29 (×4): qty 1

## 2012-12-29 MED ORDER — PANTOPRAZOLE SODIUM 40 MG PO TBEC
40.0000 mg | DELAYED_RELEASE_TABLET | Freq: Every day | ORAL | Status: DC
  Administered 2012-12-30 – 2012-12-31 (×2): 40 mg via ORAL
  Filled 2012-12-29 (×2): qty 1

## 2012-12-29 MED ORDER — FENTANYL CITRATE 0.05 MG/ML IJ SOLN
INTRAMUSCULAR | Status: AC
  Administered 2012-12-29: 50 ug via INTRAVENOUS
  Filled 2012-12-29: qty 2

## 2012-12-29 MED ORDER — LANTHANUM CARBONATE 500 MG PO CHEW
2000.0000 mg | CHEWABLE_TABLET | Freq: Three times a day (TID) | ORAL | Status: DC
  Administered 2012-12-30 – 2012-12-31 (×5): 2000 mg via ORAL
  Filled 2012-12-29 (×10): qty 4

## 2012-12-29 MED ORDER — METOCLOPRAMIDE HCL 5 MG/ML IJ SOLN
5.0000 mg | Freq: Three times a day (TID) | INTRAMUSCULAR | Status: DC | PRN
Start: 2012-12-29 — End: 2013-01-01

## 2012-12-29 MED ORDER — ALLOPURINOL 100 MG PO TABS
100.0000 mg | ORAL_TABLET | Freq: Every day | ORAL | Status: DC
  Administered 2012-12-30 – 2012-12-31 (×2): 100 mg via ORAL
  Filled 2012-12-29 (×3): qty 1

## 2012-12-29 MED ORDER — OXYCODONE-ACETAMINOPHEN 5-325 MG PO TABS
ORAL_TABLET | ORAL | Status: AC
  Filled 2012-12-29: qty 2

## 2012-12-29 MED ORDER — HYDROMORPHONE HCL PF 1 MG/ML IJ SOLN
0.5000 mg | INTRAMUSCULAR | Status: DC | PRN
  Administered 2012-12-29: 1 mg via INTRAVENOUS
  Filled 2012-12-29: qty 1

## 2012-12-29 MED ORDER — PHENYLEPHRINE HCL 10 MG/ML IJ SOLN
INTRAMUSCULAR | Status: DC | PRN
  Administered 2012-12-29 (×2): 80 ug via INTRAVENOUS

## 2012-12-29 MED ORDER — OXYCODONE HCL 5 MG/5ML PO SOLN: 5.0000 mg | Freq: Once | ORAL | Status: DC | PRN

## 2012-12-29 MED ORDER — METOCLOPRAMIDE HCL 10 MG PO TABS: 5.0000 mg | ORAL_TABLET | Freq: Three times a day (TID) | ORAL | Status: DC | PRN

## 2012-12-29 MED ORDER — PROPOFOL 10 MG/ML IV BOLUS
INTRAVENOUS | Status: DC | PRN
  Administered 2012-12-29: 200 mg via INTRAVENOUS
  Administered 2012-12-29: 50 mg via INTRAVENOUS

## 2012-12-29 MED ORDER — INSULIN ASPART 100 UNIT/ML ~~LOC~~ SOLN
0.0000 [IU] | Freq: Three times a day (TID) | SUBCUTANEOUS | Status: DC
  Administered 2012-12-29 – 2012-12-30 (×2): 2 [IU] via SUBCUTANEOUS
  Administered 2012-12-30: 5 [IU] via SUBCUTANEOUS
  Administered 2012-12-30: 8 [IU] via SUBCUTANEOUS
  Administered 2012-12-31: 2 [IU] via SUBCUTANEOUS
  Administered 2012-12-31: 5 [IU] via SUBCUTANEOUS
  Administered 2012-12-31: 2 [IU] via SUBCUTANEOUS
  Administered 2013-01-01: 5 [IU] via SUBCUTANEOUS

## 2012-12-29 MED ORDER — WARFARIN SODIUM 7.5 MG PO TABS
7.5000 mg | ORAL_TABLET | Freq: Once | ORAL | Status: AC
  Administered 2012-12-29: 7.5 mg via ORAL
  Filled 2012-12-29: qty 1

## 2012-12-29 MED ORDER — LIDOCAINE HCL (CARDIAC) 20 MG/ML IV SOLN
INTRAVENOUS | Status: DC | PRN
  Administered 2012-12-29: 60 mg via INTRAVENOUS

## 2012-12-29 MED ORDER — MIDAZOLAM HCL 5 MG/5ML IJ SOLN
INTRAMUSCULAR | Status: DC | PRN
  Administered 2012-12-29: 2 mg via INTRAVENOUS

## 2012-12-29 MED ORDER — SODIUM CHLORIDE 0.9 % IR SOLN
Status: DC | PRN
  Administered 2012-12-29: 3000 mL

## 2012-12-29 MED ORDER — HYDROCODONE-ACETAMINOPHEN 5-325 MG PO TABS
1.0000 | ORAL_TABLET | ORAL | Status: DC | PRN
  Administered 2012-12-30 – 2012-12-31 (×7): 2 via ORAL
  Filled 2012-12-29 (×7): qty 2

## 2012-12-29 MED ORDER — WARFARIN - PHARMACIST DOSING INPATIENT
Freq: Every day | Status: DC
  Administered 2012-12-31: 18:00:00

## 2012-12-29 SURGICAL SUPPLY — 49 items
BLADE SURG 10 STRL SS (BLADE) ×1 IMPLANT
BNDG COHESIVE 4X5 TAN STRL (GAUZE/BANDAGES/DRESSINGS) ×3 IMPLANT
BNDG COHESIVE 6X5 TAN STRL LF (GAUZE/BANDAGES/DRESSINGS) ×2 IMPLANT
BNDG GAUZE STRTCH 6 (GAUZE/BANDAGES/DRESSINGS) ×3 IMPLANT
CANISTER WOUND CARE 500ML ATS (WOUND CARE) ×1 IMPLANT
CLOTH BEACON ORANGE TIMEOUT ST (SAFETY) ×2 IMPLANT
COTTON STERILE ROLL (GAUZE/BANDAGES/DRESSINGS) ×1 IMPLANT
COVER SURGICAL LIGHT HANDLE (MISCELLANEOUS) ×2 IMPLANT
CUFF TOURNIQUET SINGLE 18IN (TOURNIQUET CUFF) ×2 IMPLANT
CUFF TOURNIQUET SINGLE 24IN (TOURNIQUET CUFF) IMPLANT
CUFF TOURNIQUET SINGLE 34IN LL (TOURNIQUET CUFF) IMPLANT
CUFF TOURNIQUET SINGLE 44IN (TOURNIQUET CUFF) IMPLANT
DRAPE U-SHAPE 47X51 STRL (DRAPES) ×2 IMPLANT
DRSG ADAPTIC 3X8 NADH LF (GAUZE/BANDAGES/DRESSINGS) ×1 IMPLANT
DRSG MEPITEL 4X7.2 (GAUZE/BANDAGES/DRESSINGS) ×1 IMPLANT
DRSG VAC ATS SM SENSATRAC (GAUZE/BANDAGES/DRESSINGS) ×1 IMPLANT
DURAPREP 26ML APPLICATOR (WOUND CARE) ×2 IMPLANT
ELECT CAUTERY BLADE 6.4 (BLADE) IMPLANT
ELECT REM PT RETURN 9FT ADLT (ELECTROSURGICAL) ×2
ELECTRODE REM PT RTRN 9FT ADLT (ELECTROSURGICAL) IMPLANT
GLOVE BIOGEL PI IND STRL 9 (GLOVE) ×1 IMPLANT
GLOVE BIOGEL PI INDICATOR 9 (GLOVE) ×1
GLOVE SURG ORTHO 9.0 STRL STRW (GLOVE) ×2 IMPLANT
GOWN PREVENTION PLUS XLARGE (GOWN DISPOSABLE) ×1 IMPLANT
GOWN SRG XL XLNG 56XLVL 4 (GOWN DISPOSABLE) ×1 IMPLANT
GOWN STRL NON-REIN XL XLG LVL4 (GOWN DISPOSABLE) ×2
GRAFT TISS THERASKIN 2X3 (Tissue) IMPLANT
HANDPIECE INTERPULSE COAX TIP (DISPOSABLE) ×2
KIT BASIN OR (CUSTOM PROCEDURE TRAY) ×2 IMPLANT
KIT ROOM TURNOVER OR (KITS) ×2 IMPLANT
MANIFOLD NEPTUNE II (INSTRUMENTS) ×2 IMPLANT
NS IRRIG 1000ML POUR BTL (IV SOLUTION) ×2 IMPLANT
PACK ORTHO EXTREMITY (CUSTOM PROCEDURE TRAY) ×2 IMPLANT
PAD ARMBOARD 7.5X6 YLW CONV (MISCELLANEOUS) ×4 IMPLANT
PADDING CAST COTTON 6X4 STRL (CAST SUPPLIES) ×1 IMPLANT
SET HNDPC FAN SPRY TIP SCT (DISPOSABLE) IMPLANT
SPONGE GAUZE 4X4 12PLY (GAUZE/BANDAGES/DRESSINGS) ×1 IMPLANT
SPONGE LAP 18X18 X RAY DECT (DISPOSABLE) ×2 IMPLANT
STOCKINETTE IMPERVIOUS 9X36 MD (GAUZE/BANDAGES/DRESSINGS) ×1 IMPLANT
STOCKINETTE IMPERVIOUS LG (DRAPES) ×1 IMPLANT
SUT ETHILON 2 0 PSLX (SUTURE) ×2 IMPLANT
TISSUE THERASKIN 2X3 (Tissue) ×2 IMPLANT
TOWEL OR 17X24 6PK STRL BLUE (TOWEL DISPOSABLE) ×2 IMPLANT
TOWEL OR 17X26 10 PK STRL BLUE (TOWEL DISPOSABLE) ×2 IMPLANT
TUBE ANAEROBIC SPECIMEN COL (MISCELLANEOUS) IMPLANT
TUBE CONNECTING 12X1/4 (SUCTIONS) ×2 IMPLANT
UNDERPAD 30X30 INCONTINENT (UNDERPADS AND DIAPERS) ×2 IMPLANT
WATER STERILE IRR 1000ML POUR (IV SOLUTION) ×1 IMPLANT
YANKAUER SUCT BULB TIP NO VENT (SUCTIONS) ×2 IMPLANT

## 2012-12-29 NOTE — Op Note (Signed)
OPERATIVE REPORT  DATE OF SURGERY: 12/29/2012  PATIENT:  Perry Drum.,  62 y.o. male  PRE-OPERATIVE DIAGNOSIS:  Ulcer Left Leg  POST-OPERATIVE DIAGNOSIS:  Traumatic ulcer left leg  PROCEDURE:  Procedure(s): IRRIGATION AND DEBRIDEMENT EXTREMITY Excision skin soft tissue muscle and fascia. Wound size 9 x 5 cm x 1 cm deep. Local tissue rearrangement for partial wound closure. Application of allograft split thickness skin graft 1 unit 2 x 3". Application of wound VAC.  SURGEON:  Surgeon(s): Nadara Mustard, MD  ANESTHESIA:   general  EBL:  min ML  SPECIMEN:  No Specimen  TOURNIQUET:  * No tourniquets in log *  PROCEDURE DETAILS: Patient is a 62 year old gentleman who has a traumatic wound medial aspect of the left leg. This is progressively necrosis large hematoma with necrotic tissue and presents at this time for the above-mentioned procedure. Risks and benefits were discussed including nonhealing of the wound and additional surgery. Patient states he understands and wishes to proceed at this time. Description of procedure patient was brought to the operating room and underwent a general anesthetic. After adequate levels and anesthesia were obtained patient's left lower extremity was prepped using DuraPrep draped into a sterile field. The traumatic wound was ellipsed out to a size wound 9 x 5 cm. This was irrigated with pulsatile lavage. A knife and Ronjair were used to debride and excise skin soft tissue muscle and fascia. There was good beefy bleeding muscle after debridement. The split thickness allograft skin graft was applied local tissue rearrangement was performed for partial wound closure. The wound was closed using 2-0 nylon. The wound was covered with Mepitel and a wound VAC set to -95 mm mercury suction. Patient was extubated taken to the PACU in stable condition plan for discharge to home with a home wound VAC.  PLAN OF CARE: Admit to inpatient   PATIENT DISPOSITION:  PACU  - hemodynamically stable.   Nadara Mustard, MD 12/29/2012 4:10 PM

## 2012-12-29 NOTE — Preoperative (Signed)
Beta Blockers   Reason not to administer Beta Blockers:Not Applicable 

## 2012-12-29 NOTE — Progress Notes (Signed)
ANTICOAGULATION CONSULT NOTE - Initial Consult  Pharmacy Consult for Coumadin Indication: atrial fibrillation  No Known Allergies  Patient Measurements: Height: 6' (182.9 cm) Weight: 238 lb 3.2 oz (108.047 kg) IBW/kg (Calculated) : 77.6  Vital Signs: Temp: 97.9 F (36.6 C) (07/18 1720) Temp src: Oral (07/18 1139) BP: 167/78 mmHg (07/18 1720) Pulse Rate: 98 (07/18 1720)  Labs:  Recent Labs  12/29/12 1139  HGB 10.9*  HCT 34.3*  PLT 354  APTT 37  LABPROT 15.3*  INR 1.24  CREATININE 6.11*    Estimated Creatinine Clearance: 15.9 ml/min (by C-G formula based on Cr of 6.11).   Medical History: Past Medical History  Diagnosis Date  . Chronic kidney disease     dialysis t,th,sat  . GERD (gastroesophageal reflux disease)   . Hypertension   . Neuromuscular disorder     neurophathy  . Atrial fibrillation     chronically anticoagulated with coumadin  . Hyperlipidemia   . Atrial flutter 07/07/2009    s/p ablation  . DIABETES MELLITUS, TYPE II 08/01/2008  . GOUT 08/01/2008  . ERECTILE DYSFUNCTION 08/01/2008  . PERIPHERAL NEUROPATHY 08/01/2008  . CORONARY ARTERY DISEASE 08/01/2008    non obstructive per cath 1/10; Dr. Juanda Chance  . 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 Gastroparesis 08/01/2008  . End stage renal failure on dialysis 08/01/2008  . NEPHROLITHIASIS, HX OF 08/01/2008  . Chronic systolic heart failure 06/21/2010  . COPD 07/21/2010  . Diverticulosis of colon (without mention of hemorrhage) 10/28/2010  . Benign neoplasm of colon 10/28/2010  . Hemorrhoids, internal 10/28/2010  . Renal cyst     bilateral  . Second degree Mobitz I AV block     asymptomatic  . Cyst     infected cyst on back   . Muscle spasm of back     back muscle spasms and pt is unable to lay flat   . Abscess of back     lower middle portion of back  . Headache(784.0)     HX; of migraines 25-30 years ago  . Cancer     bBasal cell on nose    Medications:  Prescriptions prior  to admission  Medication Sig Dispense Refill  . allopurinol (ZYLOPRIM) 100 MG tablet Take 1 tablet by mouth daily.       . B Complex-C-Folic Acid (DIALYVITE PO) Take 100 mg by mouth daily.        Marland Kitchen COUMADIN 5 MG tablet Take 1 tablet (5 mg total) by mouth as directed.  40 tablet  3  . ethyl chloride spray       . FOSRENOL 1000 MG chewable tablet Chew 2 tablets by mouth 3 (three) times daily with meals.       . gabapentin (NEURONTIN) 300 MG capsule Take 1 capsule (300 mg total) by mouth at bedtime.  30 capsule  11  . HYDROcodone-acetaminophen (NORCO/VICODIN) 5-325 MG per tablet Take 2 tablets by mouth every 4 (four) hours as needed for pain.  50 tablet  1  . insulin aspart (NOVOLOG FLEXPEN) 100 UNIT/ML injection Inject 30 Units into the skin 3 (three) times daily before meals.  90 mL  3  . insulin glargine (LANTUS) 100 UNIT/ML injection Inject 40 Units into the skin at bedtime.       . Insulin Syringe-Needle U-100 (BD INSULIN SYRINGE ULTRAFINE) 31G X 5/16" 0.5 ML MISC 1cc use as directed       . loperamide (IMODIUM) 2 MG capsule  Take 2 mg by mouth 4 (four) times daily as needed for diarrhea or loose stools.      Marland Kitchen omeprazole (PRILOSEC) 20 MG capsule Take 20 mg by mouth daily.      Marland Kitchen TEMAZEPAM PO Take 30 mg by mouth at bedtime.         Assessment: 62 y/o male on chronic Coumadin PTA for Afib. He is s/p I&D of a non healing left leg wound and placement of a wound VAC. Pharmacy consulted to resume Coumadin. INR is subtherapeutic at 1.24  Home dose is Coumadin 5 mg daily except 2.5 mg on T/Th (confirmed with patient). Last dose was 7/16.  Goal of Therapy:  INR 2-3 Monitor platelets by anticoagulation protocol: Yes   Plan:  -Coumadin 7.5 mg PO tonight -INR daily  [facility], [address].D., BCPS Clinical Pharmacist Pager: [phone] 12/29/2012 6:33 PM

## 2012-12-29 NOTE — Transfer of Care (Signed)
Immediate Anesthesia Transfer of Care Note  Patient: [name].  Procedure(s) Performed: Procedure(s) with comments: IRRIGATION AND DEBRIDEMENT EXTREMITY (Left) - Irrigation and Debridement Left Leg, VAC, [place]  Patient Location: PACU  Anesthesia Type:General  Level of Consciousness: awake, alert  and oriented  Airway & Oxygen Therapy: Patient Spontanous Breathing  Post-op Assessment: Report given to PACU RN and Post -op Vital signs reviewed and stable  Post vital signs: Reviewed and stable  Complications: No apparent anesthesia complications

## 2012-12-29 NOTE — H&P (Signed)
[name]. is an 62 y.o. male.   Chief Complaint: Nonhealing wound left leg HPI: Patient is a 62 year old gentleman with diabetic neuropathy with a nonhealing wound of the left leg he is failed conservative wound care and presents at this time for irrigation debridement and placement of a wound VAC with placement of skin graft.  Past Medical History  Diagnosis Date  . Chronic kidney disease     dialysis t,th,sat  . GERD (gastroesophageal reflux disease)   . Hypertension   . Neuromuscular disorder     neurophathy  . Atrial fibrillation     chronically anticoagulated with coumadin  . Hyperlipidemia   . Atrial flutter 07/07/2009    s/p ablation  . DIABETES MELLITUS, TYPE II 08/01/2008  . GOUT 08/01/2008  . ERECTILE DYSFUNCTION 08/01/2008  . PERIPHERAL NEUROPATHY 08/01/2008  . CORONARY ARTERY DISEASE 08/01/2008    non obstructive per cath 1/10; Dr. Juanda Chance  . 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 Gastroparesis 08/01/2008  . End stage renal failure on dialysis 08/01/2008  . NEPHROLITHIASIS, HX OF 08/01/2008  . Chronic systolic heart failure 06/21/2010  . COPD 07/21/2010  . Diverticulosis of colon (without mention of hemorrhage) 10/28/2010  . Benign neoplasm of colon 10/28/2010  . Hemorrhoids, internal 10/28/2010  . Renal cyst     bilateral  . Second degree Mobitz I AV block     asymptomatic  . Cyst     infected cyst on back   . Muscle spasm of back     back muscle spasms and pt is unable to lay flat   . Abscess of back     lower middle portion of back  . Headache(784.0)     HX; of migraines 25-30 years ago  . Cancer     bBasal cell on nose    Past Surgical History  Procedure Laterality Date  . Atrial ablation surgery  07-2008, 07/08/09    Dr Johney Frame  . Right foot  09-2009    infection  . Colonoscopy  67yrs ago    Dr.Orr  . Av fistula placement      left upper arm  . S/p right arm surgery after fracture    . Pilonidal cyst excision      s/p  . Cataract  extraction      Hx: right eye  . Tonsillectomy      Family History  Problem Relation Age of Onset  . Diabetes Mother   . Diabetes Maternal Grandmother   . Diabetes Paternal Grandmother   . Cancer Neg Hx     no FH of Colon Cancer   Social History:  reports that he has been smoking Cigarettes.  He has a 20 pack-year smoking history. He has never used smokeless tobacco. He reports that he does not drink alcohol or use illicit drugs.  Allergies: No Known Allergies  Medications Prior to Admission  Medication Sig Dispense Refill  . allopurinol (ZYLOPRIM) 100 MG tablet Take 1 tablet by mouth daily.       . B Complex-C-Folic Acid (DIALYVITE PO) Take 100 mg by mouth daily.        Marland Kitchen COUMADIN 5 MG tablet Take 1 tablet (5 mg total) by mouth as directed.  40 tablet  3  . ethyl chloride spray       . FOSRENOL 1000 MG chewable tablet Chew 2 tablets by mouth 3 (three) times daily with meals.       Marland Kitchen  gabapentin (NEURONTIN) 300 MG capsule Take 1 capsule (300 mg total) by mouth at bedtime.  30 capsule  11  . HYDROcodone-acetaminophen (NORCO/VICODIN) 5-325 MG per tablet Take 2 tablets by mouth every 4 (four) hours as needed for pain.  50 tablet  1  . insulin aspart (NOVOLOG FLEXPEN) 100 UNIT/ML injection Inject 30 Units into the skin 3 (three) times daily before meals.  90 mL  3  . insulin glargine (LANTUS) 100 UNIT/ML injection Inject 40 Units into the skin at bedtime.       . Insulin Syringe-Needle U-100 (BD INSULIN SYRINGE ULTRAFINE) 31G X 5/16" 0.5 ML MISC 1cc use as directed       . loperamide (IMODIUM) 2 MG capsule Take 2 mg by mouth 4 (four) times daily as needed for diarrhea or loose stools.      Marland Kitchen omeprazole (PRILOSEC) 20 MG capsule Take 20 mg by mouth daily.      Marland Kitchen TEMAZEPAM PO Take 30 mg by mouth at bedtime.         Results for orders placed during the hospital encounter of 12/29/12 (from the past 48 hour(s))  APTT     Status: None   Collection Time    12/29/12 11:39 AM      Result Value  Range   aPTT 37  24 - 37 seconds   Comment:            IF BASELINE aPTT IS ELEVATED,     SUGGEST PATIENT RISK ASSESSMENT     BE USED TO DETERMINE APPROPRIATE     ANTICOAGULANT THERAPY.  CBC     Status: Abnormal   Collection Time    12/29/12 11:39 AM      Result Value Range   WBC 11.1 (*) 4.0 - 10.5 K/uL   RBC 3.68 (*) 4.22 - 5.81 MIL/uL   Hemoglobin 10.9 (*) 13.0 - 17.0 g/dL   HCT 16.1 (*) 09.6 - 04.5 %   MCV 93.2  78.0 - 100.0 fL   MCH 29.6  26.0 - 34.0 pg   MCHC 31.8  30.0 - 36.0 g/dL   RDW 40.9 (*) 81.1 - 91.4 %   Platelets 354  150 - 400 K/uL  COMPREHENSIVE METABOLIC PANEL     Status: Abnormal   Collection Time    12/29/12 11:39 AM      Result Value Range   Sodium 139  135 - 145 mEq/L   Potassium 4.6  3.5 - 5.1 mEq/L   Chloride 95 (*) 96 - 112 mEq/L   CO2 29  19 - 32 mEq/L   Glucose, Bld 286 (*) 70 - 99 mg/dL   BUN 33 (*) 6 - 23 mg/dL   Creatinine, Ser 7.82 (*) 0.50 - 1.35 mg/dL   Calcium 9.9  8.4 - 95.6 mg/dL   Total Protein 8.2  6.0 - 8.3 g/dL   Albumin 3.8  3.5 - 5.2 g/dL   AST 12  0 - 37 U/L   ALT 10  0 - 53 U/L   Alkaline Phosphatase 90  39 - 117 U/L   Total Bilirubin 0.4  0.3 - 1.2 mg/dL   GFR calc non Af Amer 9 (*) >90 mL/min   GFR calc Af Amer 10 (*) >90 mL/min   Comment:            The eGFR has been calculated     using the CKD EPI equation.     This calculation has not been  validated in all clinical     situations.     eGFR's persistently     <90 mL/min signify     possible Chronic Kidney Disease.  PROTIME-INR     Status: Abnormal   Collection Time    12/29/12 11:39 AM      Result Value Range   Prothrombin Time 15.3 (*) 11.6 - 15.2 seconds   INR 1.24  0.00 - 1.49   Dg Chest 2 View  12/29/2012   *RADIOLOGY REPORT*  Clinical Data: End-stage renal disease, hematoma  CHEST - 2 VIEW  Comparison: Prior frontal chest x-ray and rib series 11/25/2012  Findings: Stable mild cardiomegaly and pulmonary vascular congestion.  No overt edema.   Atherosclerotic and tortuous thoracic aorta.  No pneumothorax, pleural effusion or focal airspace consolidation.  No acute osseous abnormality.  Mild central bronchitic changes are similar compared to prior.  Atherosclerotic calcifications noted in the region of the right carotid artery.  IMPRESSION:  1.  No acute cardiopulmonary disease. 2.  Stable central bronchitic changes, tortuous and atherosclerotic thoracic aorta and borderline cardiomegaly.   Original Report Authenticated By: Malachy Moan, M.D.    Review of Systems  Respiratory: Stridor: application of skin graft application of a wound VAC.   All other systems reviewed and are negative.    Blood pressure 141/78, pulse 78, temperature 97.6 F (36.4 C), temperature source Oral, resp. rate 18, height 6' (1.829 m), weight 108.047 kg (238 lb 3.2 oz), SpO2 100.00%. Physical Exam  On examination patient has a nonhealing wound of the left leg. There is good granulation tissue at the base no cellulitis no signs of infection. Assessment/Plan Assessment: Nonhealing wound left leg.  Plan: Will plan for excisional debridement possible placement of skin graft possible local tissue rearrangement possible placement of wound VAC. Risks and benefits were discussed including infection neurovascular injury persistent pain nonhealing of the wound potential for additional surgery. Patient states he understands and wished to proceed at this time.  DUDA,MARCUS V 12/29/2012, 2:51 PM

## 2012-12-29 NOTE — Anesthesia Preprocedure Evaluation (Addendum)
Anesthesia Evaluation  Patient identified by MRN, date of birth, ID band Patient awake    Reviewed: Allergy & Precautions, H&P , NPO status , Patient's Chart, lab work & pertinent test results  Airway Mallampati: II  Neck ROM: full    Dental   Pulmonary COPD         Cardiovascular hypertension, + CAD + dysrhythmias Atrial Fibrillation and Supra Ventricular Tachycardia     Neuro/Psych  Headaches,  Neuromuscular disease    GI/Hepatic GERD-  ,  Endo/Other  diabetes, Type 2  Renal/GU ESRF and DialysisRenal disease     Musculoskeletal   Abdominal   Peds  Hematology   Anesthesia Other Findings   Reproductive/Obstetrics                           Anesthesia Physical Anesthesia Plan  ASA: IV  Anesthesia Plan: General   Post-op Pain Management:    Induction: Intravenous  Airway Management Planned: LMA  Additional Equipment:   Intra-op Plan:   Post-operative Plan:   Informed Consent: I have reviewed the patients History and Physical, chart, labs and discussed the procedure including the risks, benefits and alternatives for the proposed anesthesia with the patient or authorized representative who has indicated his/her understanding and acceptance.     Plan Discussed with: CRNA, Anesthesiologist and Surgeon  Anesthesia Plan Comments:         Anesthesia Quick Evaluation

## 2012-12-29 NOTE — Anesthesia Postprocedure Evaluation (Signed)
  Anesthesia Post-op Note  Patient: [name].  Procedure(s) Performed: Procedure(s) with comments: IRRIGATION AND DEBRIDEMENT EXTREMITY (Left) - Irrigation and Debridement Left Leg, VAC, [place]  Patient Location: PACU  Anesthesia Type:General  Level of Consciousness: awake  Airway and Oxygen Therapy: Patient Spontanous Breathing  Post-op Pain: mild  Post-op Assessment: Post-op Vital signs reviewed  Post-op Vital Signs: stable  Complications: No apparent anesthesia complications

## 2012-12-30 LAB — GLUCOSE, CAPILLARY
Glucose-Capillary: 278 mg/dL — ABNORMAL HIGH (ref 70–99)
Glucose-Capillary: 255 mg/dL — ABNORMAL HIGH (ref 70–99)
Glucose-Capillary: 277 mg/dL — ABNORMAL HIGH (ref 70–99)

## 2012-12-30 LAB — PROTIME-INR: Prothrombin Time: 16.5 seconds — ABNORMAL HIGH (ref 11.6–15.2)

## 2012-12-30 MED ORDER — WARFARIN SODIUM 7.5 MG PO TABS
7.5000 mg | ORAL_TABLET | Freq: Once | ORAL | Status: AC
  Administered 2012-12-30: 7.5 mg via ORAL
  Filled 2012-12-30: qty 1

## 2012-12-30 MED ORDER — OXYCODONE HCL 5 MG PO TABS
10.0000 mg | ORAL_TABLET | ORAL | Status: DC | PRN
  Administered 2012-12-30 – 2012-12-31 (×3): 10 mg via ORAL
  Filled 2012-12-30 (×3): qty 2

## 2012-12-30 MED ORDER — DOCUSATE SODIUM 100 MG PO CAPS
100.0000 mg | ORAL_CAPSULE | Freq: Two times a day (BID) | ORAL | Status: DC
  Administered 2012-12-30 – 2012-12-31 (×3): 100 mg via ORAL
  Filled 2012-12-30 (×4): qty 1

## 2012-12-30 NOTE — Progress Notes (Signed)
ANTICOAGULATION CONSULT NOTE - Follow Up Consult  Pharmacy Consult for Coumadin Indication: atrial fibrillation  No Known Allergies  Patient Measurements: Height: 6' (182.9 cm) Weight: 238 lb 3.2 oz (108.047 kg) IBW/kg (Calculated) : 77.6   Vital Signs: Temp: 97.6 F (36.4 C) (07/19 0550) Temp src: Oral (07/19 0550) BP: 161/75 mmHg (07/19 0550) Pulse Rate: 103 (07/19 0550)  Labs:  Recent Labs  12/29/12 1139 12/30/12 0620  HGB 10.9*  --   HCT 34.3*  --   PLT 354  --   APTT 37  --   LABPROT 15.3* 16.5*  INR 1.24 1.37  CREATININE 6.11*  --     Estimated Creatinine Clearance: 15.9 ml/min (by C-G formula based on Cr of 6.11).   Medications:  Scheduled:  . allopurinol  100 mg Oral Daily  . gabapentin  300 mg Oral QHS  . insulin aspart  0-9 Units Subcutaneous TID WC  . insulin aspart  3 Units Subcutaneous TID WC  . lanthanum  2,000 mg Oral TID WC  . pantoprazole  40 mg Oral Daily  . Warfarin - Pharmacist Dosing Inpatient   Does not apply q1800    Assessment: On chronic coumadin pta for afib.PTA Coumadin 5mg  daily except 2.5mg  Tue and Th (last taken 7/16). S/p traumatic wound / hematoma. INR 1.24>>1.37. Hgb 10.9 Plt 354.   Goal of Therapy:  INR 2-3 Monitor platelets by anticoagulation protocol: Yes   Plan:  1. Coumadin 7.5mg  PO x1 2. Monitor INR, CBC, and s/sx of bleed.  Forestine Na M 12/30/2012,10:36 AM

## 2012-12-30 NOTE — Progress Notes (Signed)
Patient ID: Perry Drum., male   DOB: [birth year], 62 y.o.   MRN: [id number] Patient is postoperative day 1 status post excisional debridement large traumatic wound with hematoma with local tissue rearrangement and application of skin graft and wound VAC. Patient is comfortable we will plan to remove the wound VAC on Monday and evaluate for discharge to home with dressing changes for discharge to home with a wound VAC.

## 2012-12-31 LAB — PROTIME-INR
INR: 1.59 — ABNORMAL HIGH (ref 0.00–1.49)
Prothrombin Time: 18.5 seconds — ABNORMAL HIGH (ref 11.6–15.2)

## 2012-12-31 LAB — GLUCOSE, CAPILLARY: Glucose-Capillary: 200 mg/dL — ABNORMAL HIGH (ref 70–99)

## 2012-12-31 MED ORDER — WARFARIN SODIUM 5 MG PO TABS
5.0000 mg | ORAL_TABLET | ORAL | Status: DC
  Administered 2012-12-31: 5 mg via ORAL
  Filled 2012-12-31 (×2): qty 1

## 2012-12-31 MED ORDER — WARFARIN SODIUM 2.5 MG PO TABS: 2.5000 mg | ORAL_TABLET | ORAL | Status: DC

## 2012-12-31 NOTE — Progress Notes (Signed)
Patient ID: Perry Drum., male   DOB: Mar 19, 1951, 62 y.o.   MRN: [id number] Postoperative day 2 status post debridement of massive wound secondary to blunt trauma. The wound VAC is working well. We will reevaluate wound VAC tomorrow. Patient may be discharged to home with home wound VAC therapy or may be able to be discharged with dressing changes will evaluate at [facility] changed tomorrow.

## 2012-12-31 NOTE — Progress Notes (Signed)
ANTICOAGULATION CONSULT NOTE - Follow Up Consult  Pharmacy Consult for Coumadin Indication: atrial fibrillation  No Known Allergies  Patient Measurements: Height: 6' (182.9 cm) Weight: 238 lb 3.2 oz (108.047 kg) IBW/kg (Calculated) : 77.6   Vital Signs: Temp: 98.3 F (36.8 C) (07/20 0618) Temp src: Oral (07/20 0618) BP: 149/71 mmHg (07/20 0618) Pulse Rate: 89 (07/20 0618)  Labs:  Recent Labs  12/29/12 1139 12/30/12 0620 12/31/12 0630  HGB 10.9*  --   --   HCT 34.3*  --   --   PLT 354  --   --   APTT 37  --   --   LABPROT 15.3* 16.5* 18.5*  INR 1.24 1.37 1.59*  CREATININE 6.11*  --   --     Estimated Creatinine Clearance: 15.9 ml/min (by C-G formula based on Cr of 6.11).   Medications:  Scheduled:  . allopurinol  100 mg Oral Daily  . docusate sodium  100 mg Oral BID  . gabapentin  300 mg Oral QHS  . insulin aspart  0-9 Units Subcutaneous TID WC  . insulin aspart  3 Units Subcutaneous TID WC  . lanthanum  2,000 mg Oral TID WC  . pantoprazole  40 mg Oral Daily  . warfarin  5 mg Oral Q M,W,F,S,S -1800   And  . [START ON 01/02/2013] warfarin  2.5 mg Oral Custom  . Warfarin - Pharmacist Dosing Inpatient   Does not apply q1800    Assessment: On chronic coumadin pta for afib. PTA Coumadin 5mg  daily except 2.5mg  Tue and Th (last taken 7/16). S/p traumatic wound / hematoma. INR 1.24>>1.37>>1.59. No reports of bleeding.  Goal of Therapy:  INR 2-3 Monitor platelets by anticoagulation protocol: Yes   Plan:  1. Restart home regimen 2. Monitor INR, CBC, and s/sx of bleed.  Forestine Na M 12/31/2012,9:47 AM

## 2013-01-01 ENCOUNTER — Encounter (HOSPITAL_COMMUNITY): Payer: Self-pay | Admitting: Orthopedic Surgery

## 2013-01-01 ENCOUNTER — Ambulatory Visit: Payer: 59 | Admitting: Physical Therapy

## 2013-01-01 LAB — CBC
HCT: 27.7 % — ABNORMAL LOW (ref 39.0–52.0)
MCH: 29.1 pg (ref 26.0–34.0)
MCV: 92.6 fL (ref 78.0–100.0)
RBC: 2.99 MIL/uL — ABNORMAL LOW (ref 4.22–5.81)
WBC: 9.3 10*3/uL (ref 4.0–10.5)

## 2013-01-01 NOTE — Discharge Summary (Signed)
Physician Discharge Summary  Patient ID: [name]. MRN: [id number] DOB/AGE: 01/06/1951 62 y.o.  Admit date: 12/29/2012 Discharge date: 01/01/2013  Admission Diagnoses: Traumatic wound left leg Discharge Diagnoses: Traumatic wound left leg Active Problems:   * No active hospital problems. *   Discharged Condition: stable  Hospital Course: Patient's hospital course was essentially unremarkable. He underwent excisional debridement local tissue rearrangement application of allograft split thickness skin graft and application of wound VAC. Patient refused dialysis during his hospital stay states that he will have dialysis on Tuesday.  Consults: None  Significant Diagnostic Studies: labs: Routine labs  Treatments: surgery: See operative note  Discharge Exam: Blood pressure 161/70, pulse 100, temperature 98.1 F (36.7 C), temperature source Oral, resp. rate 16, height 6' (1.829 m), weight 108.047 kg (238 lb 3.2 oz), SpO2 94.00%. Incision/Wound: incision clean and dry no signs of infection  Disposition: 01-Home or Self Care  Discharge Orders   Future Appointments Provider Department Dept Phone   02/16/2013 11:15 AM Romero Belling, MD [facility] [phone]   Future Orders Complete By Expires     Call MD / Call 911  As directed     Comments:      If you experience chest pain or shortness of breath, CALL 911 and be transported to the hospital emergency room.  If you develope a fever above 101 F, pus (white drainage) or increased drainage or redness at the wound, or calf pain, call your surgeon's office.    Constipation Prevention  As directed     Comments:      Drink plenty of fluids.  Prune juice may be helpful.  You may use a stool softener, such as Colace (over the counter) 100 mg twice a day.  Use MiraLax (over the counter) for constipation as needed.    Diet - low sodium heart healthy  As directed     Increase activity slowly as tolerated  As directed          Medication List         allopurinol 100 MG tablet  Commonly known as:  ZYLOPRIM  Take 1 tablet by mouth daily.     BD INSULIN SYRINGE ULTRAFINE 31G X 5/16" 0.5 ML Misc  Generic drug:  Insulin Syringe-Needle U-100  1cc use as directed     COUMADIN 5 MG tablet  Generic drug:  warfarin  Take 1 tablet (5 mg total) by mouth as directed.     DIALYVITE PO  Take 100 mg by mouth daily.     ethyl chloride spray     FOSRENOL 1000 MG chewable tablet  Generic drug:  lanthanum  Chew 2 tablets by mouth 3 (three) times daily with meals.     gabapentin 300 MG capsule  Commonly known as:  NEURONTIN  Take 1 capsule (300 mg total) by mouth at bedtime.     HYDROcodone-acetaminophen 5-325 MG per tablet  Commonly known as:  NORCO/VICODIN  Take 2 tablets by mouth every 4 (four) hours as needed for pain.     insulin aspart 100 UNIT/ML injection  Commonly known as:  NOVOLOG FLEXPEN  Inject 30 Units into the skin 3 (three) times daily before meals.     insulin glargine 100 UNIT/ML injection  Commonly known as:  LANTUS  Inject 40 Units into the skin at bedtime.     loperamide 2 MG capsule  Commonly known as:  IMODIUM  Take 2 mg by mouth 4 (four) times daily as  needed for diarrhea or loose stools.     omeprazole 20 MG capsule  Commonly known as:  PRILOSEC  Take 20 mg by mouth daily.     TEMAZEPAM PO  Take 30 mg by mouth at bedtime.           Follow-up Information   Follow up with Elisabel Hanover V, MD In 1 week.   Contact information:   [address] [place] [address] [phone]       Signed: Nadara Mustard 01/01/2013, 6:50 AM

## 2013-01-04 ENCOUNTER — Other Ambulatory Visit: Payer: Self-pay | Admitting: *Deleted

## 2013-01-04 MED ORDER — "INSULIN SYRINGE-NEEDLE U-100 31G X 5/16"" 0.5 ML MISC": 1.0000 | Freq: Three times a day (TID) | Status: DC

## 2013-01-05 ENCOUNTER — Ambulatory Visit: Payer: 59 | Admitting: Physical Therapy

## 2013-01-08 ENCOUNTER — Ambulatory Visit (INDEPENDENT_AMBULATORY_CARE_PROVIDER_SITE_OTHER): Payer: 59 | Admitting: Pharmacist

## 2013-01-08 DIAGNOSIS — Z7901 Long term (current) use of anticoagulants: Secondary | ICD-10-CM

## 2013-01-08 DIAGNOSIS — I4891 Unspecified atrial fibrillation: Secondary | ICD-10-CM

## 2013-01-18 ENCOUNTER — Other Ambulatory Visit (HOSPITAL_COMMUNITY): Payer: Self-pay | Admitting: Orthopedic Surgery

## 2013-01-18 ENCOUNTER — Encounter (HOSPITAL_COMMUNITY)
Admission: EM | Disposition: A | Discharge status: Home / Self Care | Payer: Self-pay | Source: Home / Self Care | Attending: Orthopedic Surgery

## 2013-01-18 ENCOUNTER — Inpatient Hospital Stay: Admission: AD | Admit: 2013-01-18 | Payer: 59 | Source: Ambulatory Visit | Admitting: Orthopedic Surgery

## 2013-01-18 ENCOUNTER — Inpatient Hospital Stay (HOSPITAL_COMMUNITY)
Admission: EM | Admit: 2013-01-18 | Discharge: 2013-01-22 | DRG: 617 | Disposition: A | Discharge status: Home / Self Care | Payer: 59 | Attending: Orthopedic Surgery | Admitting: Orthopedic Surgery

## 2013-01-18 ENCOUNTER — Encounter (HOSPITAL_COMMUNITY): Payer: Self-pay | Admitting: Anesthesiology

## 2013-01-18 ENCOUNTER — Inpatient Hospital Stay (HOSPITAL_COMMUNITY): Payer: 59 | Admitting: Anesthesiology

## 2013-01-18 ENCOUNTER — Encounter (HOSPITAL_COMMUNITY): Payer: Self-pay | Admitting: Emergency Medicine

## 2013-01-18 DIAGNOSIS — I4891 Unspecified atrial fibrillation: Secondary | ICD-10-CM | POA: Diagnosis present

## 2013-01-18 DIAGNOSIS — I120 Hypertensive chronic kidney disease with stage 5 chronic kidney disease or end stage renal disease: Secondary | ICD-10-CM | POA: Diagnosis present

## 2013-01-18 DIAGNOSIS — J449 Chronic obstructive pulmonary disease, unspecified: Secondary | ICD-10-CM | POA: Diagnosis present

## 2013-01-18 DIAGNOSIS — K3184 Gastroparesis: Secondary | ICD-10-CM | POA: Diagnosis present

## 2013-01-18 DIAGNOSIS — Z794 Long term (current) use of insulin: Secondary | ICD-10-CM

## 2013-01-18 DIAGNOSIS — E119 Type 2 diabetes mellitus without complications: Secondary | ICD-10-CM

## 2013-01-18 DIAGNOSIS — K648 Other hemorrhoids: Secondary | ICD-10-CM | POA: Diagnosis present

## 2013-01-18 DIAGNOSIS — Z7901 Long term (current) use of anticoagulants: Secondary | ICD-10-CM

## 2013-01-18 DIAGNOSIS — E1169 Type 2 diabetes mellitus with other specified complication: Principal | ICD-10-CM | POA: Diagnosis present

## 2013-01-18 DIAGNOSIS — L03119 Cellulitis of unspecified part of limb: Secondary | ICD-10-CM | POA: Diagnosis present

## 2013-01-18 DIAGNOSIS — N186 End stage renal disease: Secondary | ICD-10-CM | POA: Diagnosis present

## 2013-01-18 DIAGNOSIS — L02419 Cutaneous abscess of limb, unspecified: Secondary | ICD-10-CM | POA: Diagnosis present

## 2013-01-18 DIAGNOSIS — I5022 Chronic systolic (congestive) heart failure: Secondary | ICD-10-CM | POA: Diagnosis present

## 2013-01-18 DIAGNOSIS — E1029 Type 1 diabetes mellitus with other diabetic kidney complication: Secondary | ICD-10-CM

## 2013-01-18 DIAGNOSIS — L97509 Non-pressure chronic ulcer of other part of unspecified foot with unspecified severity: Secondary | ICD-10-CM | POA: Diagnosis present

## 2013-01-18 DIAGNOSIS — E785 Hyperlipidemia, unspecified: Secondary | ICD-10-CM | POA: Diagnosis present

## 2013-01-18 DIAGNOSIS — Z85820 Personal history of malignant melanoma of skin: Secondary | ICD-10-CM

## 2013-01-18 DIAGNOSIS — L97909 Non-pressure chronic ulcer of unspecified part of unspecified lower leg with unspecified severity: Secondary | ICD-10-CM

## 2013-01-18 DIAGNOSIS — K5730 Diverticulosis of large intestine without perforation or abscess without bleeding: Secondary | ICD-10-CM | POA: Diagnosis present

## 2013-01-18 DIAGNOSIS — Z79899 Other long term (current) drug therapy: Secondary | ICD-10-CM

## 2013-01-18 DIAGNOSIS — M109 Gout, unspecified: Secondary | ICD-10-CM | POA: Diagnosis present

## 2013-01-18 DIAGNOSIS — M869 Osteomyelitis, unspecified: Secondary | ICD-10-CM | POA: Diagnosis present

## 2013-01-18 DIAGNOSIS — J4489 Other specified chronic obstructive pulmonary disease: Secondary | ICD-10-CM | POA: Diagnosis present

## 2013-01-18 DIAGNOSIS — M86171 Other acute osteomyelitis, right ankle and foot: Secondary | ICD-10-CM

## 2013-01-18 DIAGNOSIS — I2510 Atherosclerotic heart disease of native coronary artery without angina pectoris: Secondary | ICD-10-CM | POA: Diagnosis present

## 2013-01-18 DIAGNOSIS — M9080 Osteopathy in diseases classified elsewhere, unspecified site: Secondary | ICD-10-CM | POA: Diagnosis present

## 2013-01-18 DIAGNOSIS — K219 Gastro-esophageal reflux disease without esophagitis: Secondary | ICD-10-CM | POA: Diagnosis present

## 2013-01-18 DIAGNOSIS — L02619 Cutaneous abscess of unspecified foot: Secondary | ICD-10-CM | POA: Diagnosis present

## 2013-01-18 DIAGNOSIS — B9689 Other specified bacterial agents as the cause of diseases classified elsewhere: Secondary | ICD-10-CM | POA: Diagnosis present

## 2013-01-18 HISTORY — PX: I & D EXTREMITY: SHX5045

## 2013-01-18 HISTORY — PX: TOE AMPUTATION: SHX809

## 2013-01-18 LAB — CBC WITH DIFFERENTIAL/PLATELET
Basophils Absolute: 0.1 10*3/uL (ref 0.0–0.1)
HCT: 27.6 % — ABNORMAL LOW (ref 39.0–52.0)
Lymphocytes Relative: 10 % — ABNORMAL LOW (ref 12–46)
Neutro Abs: 13.1 10*3/uL — ABNORMAL HIGH (ref 1.7–7.7)
Neutrophils Relative %: 83 % — ABNORMAL HIGH (ref 43–77)
Platelets: 611 10*3/uL — ABNORMAL HIGH (ref 150–400)
RDW: 15.6 % — ABNORMAL HIGH (ref 11.5–15.5)
WBC: 15.9 10*3/uL — ABNORMAL HIGH (ref 4.0–10.5)

## 2013-01-18 LAB — COMPREHENSIVE METABOLIC PANEL
Alkaline Phosphatase: 114 U/L (ref 39–117)
BUN: 32 mg/dL — ABNORMAL HIGH (ref 6–23)
GFR calc Af Amer: 14 mL/min — ABNORMAL LOW (ref >90)
Glucose, Bld: 256 mg/dL — ABNORMAL HIGH (ref 70–99)
Potassium: 4 mEq/L (ref 3.5–5.1)
Total Protein: 7.6 g/dL (ref 6.0–8.3)

## 2013-01-18 LAB — GLUCOSE, CAPILLARY
Glucose-Capillary: 255 mg/dL — ABNORMAL HIGH (ref 70–99)
Glucose-Capillary: 217 mg/dL — ABNORMAL HIGH (ref 70–99)

## 2013-01-18 LAB — PROTIME-INR
INR: 1.62 — ABNORMAL HIGH (ref 0.00–1.49)
Prothrombin Time: 18.8 seconds — ABNORMAL HIGH (ref 11.6–15.2)

## 2013-01-18 LAB — APTT: aPTT: 46 seconds — ABNORMAL HIGH (ref 24–37)

## 2013-01-18 SURGERY — IRRIGATION AND DEBRIDEMENT EXTREMITY
Anesthesia: General | Site: Leg Lower | Laterality: Bilateral | Wound class: Dirty or Infected

## 2013-01-18 MED ORDER — HYDROMORPHONE HCL PF 1 MG/ML IJ SOLN
INTRAMUSCULAR | Status: AC
  Administered 2013-01-18: 0.5 mg
  Filled 2013-01-18: qty 1

## 2013-01-18 MED ORDER — ONDANSETRON HCL 4 MG/2ML IJ SOLN
INTRAMUSCULAR | Status: DC | PRN
  Administered 2013-01-18: 4 mg via INTRAVENOUS

## 2013-01-18 MED ORDER — ARTIFICIAL TEARS OP OINT
TOPICAL_OINTMENT | OPHTHALMIC | Status: DC | PRN
  Administered 2013-01-18: 1 via OPHTHALMIC

## 2013-01-18 MED ORDER — HYDROMORPHONE HCL PF 1 MG/ML IJ SOLN
INTRAMUSCULAR | Status: AC
  Filled 2013-01-18: qty 1

## 2013-01-18 MED ORDER — GABAPENTIN 300 MG PO CAPS
300.0000 mg | ORAL_CAPSULE | Freq: Every day | ORAL | Status: DC
  Administered 2013-01-18 – 2013-01-21 (×4): 300 mg via ORAL
  Filled 2013-01-18 (×5): qty 1

## 2013-01-18 MED ORDER — WARFARIN SODIUM 5 MG PO TABS
5.0000 mg | ORAL_TABLET | Freq: Once | ORAL | Status: AC
  Administered 2013-01-18: 5 mg via ORAL
  Filled 2013-01-18: qty 1

## 2013-01-18 MED ORDER — TEMAZEPAM 15 MG PO CAPS
30.0000 mg | ORAL_CAPSULE | Freq: Every day | ORAL | Status: DC
  Administered 2013-01-18 – 2013-01-21 (×4): 30 mg via ORAL
  Filled 2013-01-18 (×4): qty 2

## 2013-01-18 MED ORDER — ALLOPURINOL 100 MG PO TABS
100.0000 mg | ORAL_TABLET | Freq: Every day | ORAL | Status: DC
  Administered 2013-01-18 – 2013-01-22 (×5): 100 mg via ORAL
  Filled 2013-01-18 (×5): qty 1

## 2013-01-18 MED ORDER — LOPERAMIDE HCL 2 MG PO CAPS
2.0000 mg | ORAL_CAPSULE | Freq: Four times a day (QID) | ORAL | Status: DC | PRN
  Administered 2013-01-18: 2 mg via ORAL
  Filled 2013-01-18 (×2): qty 1

## 2013-01-18 MED ORDER — INSULIN ASPART 100 UNIT/ML ~~LOC~~ SOLN
3.0000 [IU] | Freq: Three times a day (TID) | SUBCUTANEOUS | Status: DC
  Administered 2013-01-19 – 2013-01-22 (×10): 3 [IU] via SUBCUTANEOUS

## 2013-01-18 MED ORDER — LIDOCAINE HCL (CARDIAC) 20 MG/ML IV SOLN
INTRAVENOUS | Status: DC | PRN
  Administered 2013-01-18: 75 mg via INTRAVENOUS

## 2013-01-18 MED ORDER — HYDROCODONE-ACETAMINOPHEN 5-325 MG PO TABS
ORAL_TABLET | ORAL | Status: AC
  Filled 2013-01-18: qty 2

## 2013-01-18 MED ORDER — PROPOFOL 10 MG/ML IV BOLUS
INTRAVENOUS | Status: DC | PRN
  Administered 2013-01-18: 250 mg via INTRAVENOUS

## 2013-01-18 MED ORDER — OXYCODONE-ACETAMINOPHEN 5-325 MG PO TABS
1.0000 | ORAL_TABLET | ORAL | Status: DC | PRN
  Administered 2013-01-19 – 2013-01-22 (×3): 2 via ORAL
  Filled 2013-01-18 (×3): qty 2

## 2013-01-18 MED ORDER — PIPERACILLIN-TAZOBACTAM 3.375 G IVPB
3.3750 g | Freq: Four times a day (QID) | INTRAVENOUS | Status: DC
  Administered 2013-01-18: 3.375 g via INTRAVENOUS
  Filled 2013-01-18 (×3): qty 50

## 2013-01-18 MED ORDER — FENTANYL CITRATE 0.05 MG/ML IJ SOLN
INTRAMUSCULAR | Status: DC | PRN
  Administered 2013-01-18: 100 ug via INTRAVENOUS
  Administered 2013-01-18: 50 ug via INTRAVENOUS

## 2013-01-18 MED ORDER — VANCOMYCIN HCL IN DEXTROSE 1-5 GM/200ML-% IV SOLN
1000.0000 mg | Freq: Once | INTRAVENOUS | Status: AC
  Administered 2013-01-18: 1000 mg via INTRAVENOUS
  Filled 2013-01-18: qty 200

## 2013-01-18 MED ORDER — HYDROMORPHONE HCL PF 1 MG/ML IJ SOLN: 0.2500 mg | INTRAMUSCULAR | Status: DC | PRN

## 2013-01-18 MED ORDER — METOCLOPRAMIDE HCL 5 MG/ML IJ SOLN: 5.0000 mg | Freq: Three times a day (TID) | INTRAMUSCULAR | Status: DC | PRN

## 2013-01-18 MED ORDER — LANTHANUM CARBONATE 500 MG PO CHEW
2000.0000 mg | CHEWABLE_TABLET | Freq: Three times a day (TID) | ORAL | Status: DC
  Administered 2013-01-19 – 2013-01-22 (×9): 2000 mg via ORAL
  Filled 2013-01-18 (×13): qty 4

## 2013-01-18 MED ORDER — ONDANSETRON HCL 4 MG/2ML IJ SOLN: 4.0000 mg | Freq: Once | INTRAMUSCULAR | Status: DC | PRN

## 2013-01-18 MED ORDER — ENOXAPARIN SODIUM 30 MG/0.3ML ~~LOC~~ SOLN
30.0000 mg | SUBCUTANEOUS | Status: DC
  Filled 2013-01-18: qty 0.3

## 2013-01-18 MED ORDER — SODIUM CHLORIDE 0.9 % IR SOLN
Status: DC | PRN
  Administered 2013-01-18: 1000 mL

## 2013-01-18 MED ORDER — HYDROCODONE-ACETAMINOPHEN 5-325 MG PO TABS
1.0000 | ORAL_TABLET | ORAL | Status: DC | PRN
  Administered 2013-01-18 – 2013-01-21 (×12): 2 via ORAL
  Filled 2013-01-18 (×12): qty 2

## 2013-01-18 MED ORDER — ONDANSETRON HCL 4 MG/2ML IJ SOLN: 4.0000 mg | Freq: Four times a day (QID) | INTRAMUSCULAR | Status: DC | PRN

## 2013-01-18 MED ORDER — SODIUM CHLORIDE 0.9 % IV SOLN
INTRAVENOUS | Status: DC | PRN
  Administered 2013-01-18: 15:00:00 via INTRAVENOUS

## 2013-01-18 MED ORDER — METOCLOPRAMIDE HCL 10 MG PO TABS: 5.0000 mg | ORAL_TABLET | Freq: Three times a day (TID) | ORAL | Status: DC | PRN

## 2013-01-18 MED ORDER — WARFARIN SODIUM 2.5 MG PO TABS: 2.5000 mg | ORAL_TABLET | Freq: Every evening | ORAL | Status: DC

## 2013-01-18 MED ORDER — HYDROMORPHONE HCL PF 1 MG/ML IJ SOLN
0.5000 mg | INTRAMUSCULAR | Status: DC | PRN
  Administered 2013-01-18: 0.5 mg via INTRAVENOUS
  Administered 2013-01-18 – 2013-01-20 (×5): 1 mg via INTRAVENOUS
  Filled 2013-01-18 (×5): qty 1

## 2013-01-18 MED ORDER — PIPERACILLIN-TAZOBACTAM 3.375 G IVPB: 3.3750 g | Freq: Four times a day (QID) | INTRAVENOUS | Status: DC

## 2013-01-18 MED ORDER — INSULIN ASPART 100 UNIT/ML ~~LOC~~ SOLN
0.0000 [IU] | Freq: Three times a day (TID) | SUBCUTANEOUS | Status: DC
  Administered 2013-01-19: 7 [IU] via SUBCUTANEOUS
  Administered 2013-01-19: 5 [IU] via SUBCUTANEOUS
  Administered 2013-01-19: 12 [IU] via SUBCUTANEOUS
  Administered 2013-01-20: 7 [IU] via SUBCUTANEOUS
  Administered 2013-01-20: 5 [IU] via SUBCUTANEOUS
  Administered 2013-01-20: 9 [IU] via SUBCUTANEOUS
  Administered 2013-01-21: 7 [IU] via SUBCUTANEOUS
  Administered 2013-01-21: 3 [IU] via SUBCUTANEOUS
  Administered 2013-01-21: 5 [IU] via SUBCUTANEOUS
  Administered 2013-01-22 (×2): 3 [IU] via SUBCUTANEOUS

## 2013-01-18 MED ORDER — SODIUM CHLORIDE 0.9 % IV SOLN
INTRAVENOUS | Status: DC
  Administered 2013-01-18: 12:00:00 via INTRAVENOUS

## 2013-01-18 MED ORDER — WARFARIN - PHARMACIST DOSING INPATIENT: Freq: Every day | Status: DC

## 2013-01-18 MED ORDER — PANTOPRAZOLE SODIUM 40 MG PO TBEC
40.0000 mg | DELAYED_RELEASE_TABLET | Freq: Every day | ORAL | Status: DC
  Administered 2013-01-18 – 2013-01-22 (×5): 40 mg via ORAL
  Filled 2013-01-18 (×5): qty 1

## 2013-01-18 MED ORDER — VANCOMYCIN HCL IN DEXTROSE 1-5 GM/200ML-% IV SOLN
1000.0000 mg | Freq: Two times a day (BID) | INTRAVENOUS | Status: DC
  Administered 2013-01-18: 1000 mg via INTRAVENOUS
  Filled 2013-01-18 (×2): qty 200

## 2013-01-18 MED ORDER — WARFARIN SODIUM 5 MG PO TABS: 5.0000 mg | ORAL_TABLET | ORAL | Status: DC

## 2013-01-18 MED ORDER — ONDANSETRON HCL 4 MG PO TABS: 4.0000 mg | ORAL_TABLET | Freq: Four times a day (QID) | ORAL | Status: DC | PRN

## 2013-01-18 MED ORDER — PIPERACILLIN-TAZOBACTAM IN DEX 2-0.25 GM/50ML IV SOLN
2.2500 g | Freq: Three times a day (TID) | INTRAVENOUS | Status: DC
  Administered 2013-01-18 – 2013-01-22 (×12): 2.25 g via INTRAVENOUS
  Filled 2013-01-18 (×14): qty 50

## 2013-01-18 SURGICAL SUPPLY — 49 items
BANDAGE ELASTIC 4 VELCRO ST LF (GAUZE/BANDAGES/DRESSINGS) ×1 IMPLANT
BANDAGE GAUZE ELAST BULKY 4 IN (GAUZE/BANDAGES/DRESSINGS) ×2 IMPLANT
BLADE SURG 10 STRL SS (BLADE) ×4 IMPLANT
BNDG COHESIVE 4X5 TAN STRL (GAUZE/BANDAGES/DRESSINGS) ×3 IMPLANT
BNDG COHESIVE 6X5 TAN STRL LF (GAUZE/BANDAGES/DRESSINGS) ×4 IMPLANT
BNDG GAUZE STRTCH 6 (GAUZE/BANDAGES/DRESSINGS) ×6 IMPLANT
CLOTH BEACON ORANGE TIMEOUT ST (SAFETY) ×2 IMPLANT
COTTON STERILE ROLL (GAUZE/BANDAGES/DRESSINGS) ×2 IMPLANT
COVER SURGICAL LIGHT HANDLE (MISCELLANEOUS) ×2 IMPLANT
CUFF TOURNIQUET SINGLE 18IN (TOURNIQUET CUFF) ×2 IMPLANT
CUFF TOURNIQUET SINGLE 24IN (TOURNIQUET CUFF) IMPLANT
CUFF TOURNIQUET SINGLE 34IN LL (TOURNIQUET CUFF) IMPLANT
CUFF TOURNIQUET SINGLE 44IN (TOURNIQUET CUFF) IMPLANT
DRAPE ORTHO SPLIT 77X108 STRL (DRAPES) ×2
DRAPE SURG ORHT 6 SPLT 77X108 (DRAPES) IMPLANT
DRAPE U-SHAPE 47X51 STRL (DRAPES) ×2 IMPLANT
DRSG ADAPTIC 3X8 NADH LF (GAUZE/BANDAGES/DRESSINGS) ×2 IMPLANT
DRSG PAD ABDOMINAL 8X10 ST (GAUZE/BANDAGES/DRESSINGS) ×2 IMPLANT
DURAPREP 26ML APPLICATOR (WOUND CARE) ×2 IMPLANT
ELECT CAUTERY BLADE 6.4 (BLADE) IMPLANT
ELECT REM PT RETURN 9FT ADLT (ELECTROSURGICAL)
ELECTRODE REM PT RTRN 9FT ADLT (ELECTROSURGICAL) IMPLANT
GAUZE XEROFORM 5X9 LF (GAUZE/BANDAGES/DRESSINGS) ×1 IMPLANT
GLOVE BIOGEL PI IND STRL 9 (GLOVE) ×1 IMPLANT
GLOVE BIOGEL PI INDICATOR 9 (GLOVE) ×1
GLOVE SURG ORTHO 9.0 STRL STRW (GLOVE) ×2 IMPLANT
GOWN PREVENTION PLUS XLARGE (GOWN DISPOSABLE) ×2 IMPLANT
GOWN SRG XL XLNG 56XLVL 4 (GOWN DISPOSABLE) ×1 IMPLANT
GOWN STRL NON-REIN XL XLG LVL4 (GOWN DISPOSABLE) ×2
HANDPIECE INTERPULSE COAX TIP (DISPOSABLE) ×2
KIT BASIN OR (CUSTOM PROCEDURE TRAY) ×2 IMPLANT
KIT ROOM TURNOVER OR (KITS) ×2 IMPLANT
MANIFOLD NEPTUNE II (INSTRUMENTS) ×2 IMPLANT
NS IRRIG 1000ML POUR BTL (IV SOLUTION) ×2 IMPLANT
PACK ORTHO EXTREMITY (CUSTOM PROCEDURE TRAY) ×2 IMPLANT
PAD ARMBOARD 7.5X6 YLW CONV (MISCELLANEOUS) ×4 IMPLANT
PADDING CAST COTTON 6X4 STRL (CAST SUPPLIES) ×2 IMPLANT
SET HNDPC FAN SPRY TIP SCT (DISPOSABLE) IMPLANT
SPONGE GAUZE 4X4 12PLY (GAUZE/BANDAGES/DRESSINGS) ×3 IMPLANT
SPONGE LAP 18X18 X RAY DECT (DISPOSABLE) ×3 IMPLANT
STOCKINETTE IMPERVIOUS 9X36 MD (GAUZE/BANDAGES/DRESSINGS) ×3 IMPLANT
SUT ETHILON 2 0 PSLX (SUTURE) ×4 IMPLANT
TOWEL OR 17X24 6PK STRL BLUE (TOWEL DISPOSABLE) ×2 IMPLANT
TOWEL OR 17X26 10 PK STRL BLUE (TOWEL DISPOSABLE) ×2 IMPLANT
TUBE ANAEROBIC SPECIMEN COL (MISCELLANEOUS) IMPLANT
TUBE CONNECTING 12X1/4 (SUCTIONS) ×2 IMPLANT
UNDERPAD 30X30 INCONTINENT (UNDERPADS AND DIAPERS) ×2 IMPLANT
WATER STERILE IRR 1000ML POUR (IV SOLUTION) ×2 IMPLANT
YANKAUER SUCT BULB TIP NO VENT (SUCTIONS) ×2 IMPLANT

## 2013-01-18 NOTE — Op Note (Signed)
OPERATIVE REPORT  DATE OF SURGERY: 01/18/2013  PATIENT:  Perry Drum.,  62 y.o. male  PRE-OPERATIVE DIAGNOSIS:  Abscess/Sepsis Left Leg and right foot  POST-OPERATIVE DIAGNOSIS:  Abscess/Sepsis Left Leg and right foot  PROCEDURE:  Procedure(s): IRRIGATION AND DEBRIDEMENT EXTREMITY Right foot fifth ray amputation with excision of abscess. Cultures obtained right foot aerobic and anaerobic. Excision of necrotic tissue wound left leg with local tissue rearrangement to closure wound 4 cm x 8 cm. Excision necrotic skin over the left Achilles and left fifth metatarsal head.  SURGEON:  Surgeon(s): Nadara Mustard, MD  ANESTHESIA:   general  EBL:  Minimal ML  SPECIMEN:  No Specimen  TOURNIQUET:  * No tourniquets in log *  PROCEDURE DETAILS: Patient is a 62 year old gentleman who reports staying these had about a 5 day history of fever and chills and no appetite. He states his glucose is running over 250 an and has not eaten. in 2 days. Patient has a new necrotic ulcer over the fifth metatarsal head and left Achilles and the left foot. He has a new necrotic purulent ulcer over the fifth metatarsal right foot. Do to fever chills systemic symptoms and new ulcerations patient presents at this time for urgent surgical intervention. Risks and benefits were discussed including persistent infection nonhealing of the wounds potential for amputation. Patient states he understands was to proceed at this time. Description of procedure patient was brought to the operating room and underwent a general anesthetic. After adequate levels of anesthesia were obtained patient's bilateral lower extremities was prepped using DuraPrep and draped into a sterile field. Attention was first focused on the left lower extremity. The necrotic skin graft was removed and the proximal medial left leg wound. This is debrided down to muscle necrotic fascia was excised this was pulsatile lavage there was good healthy viable  muscle deep within the wound bed. The local tissue was then rearranged 4 x 8 cm and the wound closed with 2-0 nylon. The ulcer over the fifth metatarsal head and left Achilles the left was excised of skin soft tissue tendon and muscle. There was good petechial bleeding after debridement the Achilles ulcer is 3 cm x 6 cm in the fifth metatarsal head necrotic ulcer was 3 cm x 4 cm. There was petechial bleeding of the base of the wound I am concerned that there may be problems with the soft tissue over the fifth metatarsal head of left foot but there is no abscess and this was left to see how it would heal. Attention was then focused on the right lower extremity. Patient had a massive ulcer over the fifth metatarsal head. The necrotic skin soft tissue and muscle was excised from the wound and this left an open wound with exposed bone the abscess was circumferential around the fifth metatarsal. A fifth ray amputation was performed this was irrigated with pulsatile lavage there was good petechial bleeding at the wound bed. The wound was loosely closed with 2-0 nylon. All wounds were covered with Adaptic Xeroform 4 x 4's ABDs Kerlix and Coban. Patient was extubated taken the PACU in stable condition plan to continue IV vancomycin and Zosyn for a minimum of 4 days.  PLAN OF CARE: Admit to inpatient   PATIENT DISPOSITION:  PACU - hemodynamically stable.   Nadara Mustard, MD 01/18/2013 4:46 PM

## 2013-01-18 NOTE — ED Provider Notes (Signed)
MSE was initiated and I personally evaluated the patient and placed orders (if any) at  11:41 AM on January 18, 2013.  The patient appears stable.  He is presenting from Dr. Audrie Lia office with orders for admission.  Dr. [name] has written for antibiotics, labs and plans for surgical debridement of left lower extremity later today.  Pt has been having increased ulcerations on left lower extremity, associated with increasing fatigue.  Pt is awake, alert, no increased respiratory effort, compression dressing in place- c/d/i- over left lower extremity.    Ethelda Chick, MD 01/18/13 [phone]

## 2013-01-18 NOTE — Anesthesia Preprocedure Evaluation (Addendum)
Anesthesia Evaluation  Patient identified by MRN, date of birth, ID band Patient awake    Reviewed: Allergy & Precautions, H&P , NPO status , Patient's Chart, lab work & pertinent test results  Airway Mallampati: I TM Distance: >3 FB Neck ROM: full    Dental   Pulmonary COPDCurrent Smoker,          Cardiovascular hypertension, + CAD + dysrhythmias Atrial Fibrillation Rhythm:irregular Rate:Normal     Neuro/Psych  Headaches,  Neuromuscular disease    GI/Hepatic GERD-  ,  Endo/Other  diabetes, Poorly Controlled, Type 2, Insulin Dependent  Renal/GU Dialysis and ESRFRenal disease     Musculoskeletal   Abdominal   Peds  Hematology   Anesthesia Other Findings   Reproductive/Obstetrics                          Anesthesia Physical Anesthesia Plan  ASA: IV  Anesthesia Plan: General   Post-op Pain Management:    Induction: Intravenous  Airway Management Planned: LMA and Oral ETT  Additional Equipment:   Intra-op Plan:   Post-operative Plan: Extubation in OR  Informed Consent: I have reviewed the patients History and Physical, chart, labs and discussed the procedure including the risks, benefits and alternatives for the proposed anesthesia with the patient or authorized representative who has indicated his/her understanding and acceptance.     Plan Discussed with: CRNA, Anesthesiologist and Surgeon  Anesthesia Plan Comments:         Anesthesia Quick Evaluation

## 2013-01-18 NOTE — Progress Notes (Signed)
ANTIBIOTIC CONSULT NOTE - INITIAL  Pharmacy Consult for Vancomycin/Zosyn Indication: leg and foot abscess  No Known Allergies  Patient Measurements: Height: 6' 0.05" (183 cm) Weight: 238 lb 1.6 oz (108 kg) IBW/kg (Calculated) : 77.71  Vital Signs: Temp: 98.2 F (36.8 C) (08/07 1855) Temp src: Oral (08/07 1346) BP: 138/69 mmHg (08/07 1855) Pulse Rate: 92 (08/07 1855) Intake/Output from previous day:   Intake/Output from this shift:    Labs:  Recent Labs  01/18/13 1215  WBC 15.9*  HGB 8.9*  PLT 611*  CREATININE 4.75*   Estimated Creatinine Clearance: 20.5 ml/min (by C-G formula based on Cr of 4.75). No results found for this basename: VANCOTROUGH, VANCOPEAK, VANCORANDOM, GENTTROUGH, GENTPEAK, GENTRANDOM, TOBRATROUGH, TOBRAPEAK, TOBRARND, AMIKACINPEAK, AMIKACINTROU, AMIKACIN,  in the last 72 hours   Microbiology: No results found for this or any previous visit (from the past 720 hour(s)).  Medical History: Past Medical History  Diagnosis Date  . Chronic kidney disease     dialysis t,th,sat  . GERD (gastroesophageal reflux disease)   . Hypertension   . Neuromuscular disorder     neurophathy  . Atrial fibrillation     chronically anticoagulated with coumadin  . Hyperlipidemia   . Atrial flutter 07/07/2009    s/p ablation  . DIABETES MELLITUS, TYPE II 08/01/2008  . GOUT 08/01/2008  . ERECTILE DYSFUNCTION 08/01/2008  . PERIPHERAL NEUROPATHY 08/01/2008  . CORONARY ARTERY DISEASE 08/01/2008    non obstructive per cath 1/10; Dr. Juanda Chance  . 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 Gastroparesis 08/01/2008  . End stage renal failure on dialysis 08/01/2008  . NEPHROLITHIASIS, HX OF 08/01/2008  . Chronic systolic heart failure 06/21/2010  . COPD 07/21/2010  . Diverticulosis of colon (without mention of hemorrhage) 10/28/2010  . Benign neoplasm of colon 10/28/2010  . Hemorrhoids, internal 10/28/2010  . Renal cyst     bilateral  . Second degree Mobitz I AV  block     asymptomatic  . Cyst     infected cyst on back   . Muscle spasm of back     back muscle spasms and pt is unable to lay flat   . Abscess of back     lower middle portion of back  . Headache(784.0)     HX; of migraines 25-30 years ago  . Cancer     bBasal cell on nose    Medications:  Prescriptions prior to admission  Medication Sig Dispense Refill  . allopurinol (ZYLOPRIM) 100 MG tablet Take 1 tablet by mouth daily.       . B Complex-C-Folic Acid (DIALYVITE PO) Take 100 mg by mouth daily.        Marland Kitchen FOSRENOL 1000 MG chewable tablet Chew 2 tablets by mouth 3 (three) times daily with meals.       . gabapentin (NEURONTIN) 300 MG capsule Take 1 capsule (300 mg total) by mouth at bedtime.  30 capsule  Perry  . HYDROcodone-acetaminophen (NORCO/VICODIN) 5-325 MG per tablet Take 0.5-1 tablets by mouth every 4 (four) hours as needed for pain. For pain      . insulin aspart (NOVOLOG FLEXPEN) 100 UNIT/ML injection Inject 30 Units into the skin 3 (three) times daily before meals.  90 mL  3  . loperamide (IMODIUM) 2 MG capsule Take 2 mg by mouth 4 (four) times daily as needed for diarrhea or loose stools.      Marland Kitchen omeprazole (PRILOSEC) 20 MG capsule Take 20 mg by  mouth daily.      . temazepam (RESTORIL) 30 MG capsule Take 30 mg by mouth at bedtime.      Marland Kitchen warfarin (COUMADIN) 5 MG tablet Take 2.5-5 mg by mouth every evening. Tues & Thurs take 2.5mg  & 5mg  on all other days      . ethyl chloride spray Apply 1 application topically daily as needed. For pain      . Insulin Syringe-Needle U-100 (BD INSULIN SYRINGE ULTRAFINE) 31G X 5/16" 0.5 ML MISC 1 each by Other route 3 (three) times daily.  200 each  3   Scheduled:  . enoxaparin (LOVENOX) injection  30 mg Subcutaneous Q24H  . HYDROmorphone      . [START ON 01/19/2013] insulin aspart  0-9 Units Subcutaneous TID WC   Infusions:  . sodium chloride 20 mL/hr at 01/18/13 1215   Assessment: 62 y/o [name] HD patient admitted with left leg and right foot  abscess requiring broad spectrum antibiotics. Received preop vanc 1g and zosyn 3.375g prior to I&D with right foot fifth ray amputation. MD originally ordered 1g q12h but this is inappropriate as patient is dialysis dependent. Will renally adjust antibiotics. Patient also is on chronic coumadin for h/o afib that has not been resumed.  Goal of Therapy:  Pre HD vanc level =15-25  Plan:  Will give another 1g to equal 2g load dose then f/u for subsequent HD sessions. Change zosyn to 2.25g IV q8h.  Verlene Mayer, PharmD, BCPS Pager [phone] 01/18/2013,7:01 PM

## 2013-01-18 NOTE — Anesthesia Postprocedure Evaluation (Signed)
  Anesthesia Post-op Note  Patient: [name].  Procedure(s) Performed: Procedure(s) with comments: IRRIGATION AND DEBRIDEMENT EXTREMITYLEFT LEG AND RIGHT FOOT (Bilateral) - Left Leg Excisional Debridement  Patient Location: PACU  Anesthesia Type:General  Level of Consciousness: awake, alert , oriented and patient cooperative  Airway and Oxygen Therapy: Patient Spontanous Breathing  Post-op Pain: mild  Post-op Assessment: Post-op Vital signs reviewed, Patient's Cardiovascular Status Stable, Respiratory Function Stable, Patent Airway, No signs of Nausea or vomiting and Pain level controlled  Post-op Vital Signs: stable  Complications: No apparent anesthesia complications

## 2013-01-18 NOTE — H&P (Signed)
[name]. is an 62 y.o. male.   Chief Complaint: Fever chills nausea vomiting with pain in the left leg status post irrigation and debridement for proximal left leg wound and this now developed necrotic wounds over the Achilles and lateral aspect of the left foot. HPI: Patient states that over the last few days he is developed fever chills nausea and vomiting. He states his glucose has been running about 250 despite patient not eating.  Past Medical History  Diagnosis Date  . Chronic kidney disease     dialysis t,th,sat  . GERD (gastroesophageal reflux disease)   . Hypertension   . Neuromuscular disorder     neurophathy  . Atrial fibrillation     chronically anticoagulated with coumadin  . Hyperlipidemia   . Atrial flutter 07/07/2009    s/p ablation  . DIABETES MELLITUS, TYPE II 08/01/2008  . GOUT 08/01/2008  . ERECTILE DYSFUNCTION 08/01/2008  . PERIPHERAL NEUROPATHY 08/01/2008  . CORONARY ARTERY DISEASE 08/01/2008    non obstructive per cath 1/10; Dr. Juanda Chance  . 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 Gastroparesis 08/01/2008  . End stage renal failure on dialysis 08/01/2008  . NEPHROLITHIASIS, HX OF 08/01/2008  . Chronic systolic heart failure 06/21/2010  . COPD 07/21/2010  . Diverticulosis of colon (without mention of hemorrhage) 10/28/2010  . Benign neoplasm of colon 10/28/2010  . Hemorrhoids, internal 10/28/2010  . Renal cyst     bilateral  . Second degree Mobitz I AV block     asymptomatic  . Cyst     infected cyst on back   . Muscle spasm of back     back muscle spasms and pt is unable to lay flat   . Abscess of back     lower middle portion of back  . Headache(784.0)     HX; of migraines 25-30 years ago  . Cancer     bBasal cell on nose    Past Surgical History  Procedure Laterality Date  . Atrial ablation surgery  07-2008, 07/08/09    Dr Johney Frame  . Right foot  09-2009    infection  . Colonoscopy  56yrs ago    Dr.Orr  . Av fistula placement       left upper arm  . S/p right arm surgery after fracture    . Pilonidal cyst excision      s/p  . Cataract extraction      Hx: right eye  . Tonsillectomy    . I&d extremity Left 12/29/2012    Procedure: IRRIGATION AND DEBRIDEMENT EXTREMITY;  Surgeon: Nadara Mustard, MD;  Location: [facility] OR;  Service: Orthopedics;  Laterality: Left;  Irrigation and Debridement Left Leg, VAC, [place]    Family History  Problem Relation Age of Onset  . Diabetes Mother   . Diabetes Maternal Grandmother   . Diabetes Paternal Grandmother   . Cancer Neg Hx     no FH of Colon Cancer   Social History:  reports that he has been smoking Cigarettes.  He has a 20 pack-year smoking history. He has never used smokeless tobacco. He reports that he does not drink alcohol or use illicit drugs.  Allergies: No Known Allergies  Medications Prior to Admission  Medication Sig Dispense Refill  . allopurinol (ZYLOPRIM) 100 MG tablet Take 1 tablet by mouth daily.       . B Complex-C-Folic Acid (DIALYVITE PO) Take 100 mg by mouth daily.        Marland Kitchen  FOSRENOL 1000 MG chewable tablet Chew 2 tablets by mouth 3 (three) times daily with meals.       . gabapentin (NEURONTIN) 300 MG capsule Take 1 capsule (300 mg total) by mouth at bedtime.  30 capsule  11  . HYDROcodone-acetaminophen (NORCO/VICODIN) 5-325 MG per tablet Take 0.5-1 tablets by mouth every 4 (four) hours as needed for pain. For pain      . insulin aspart (NOVOLOG FLEXPEN) 100 UNIT/ML injection Inject 30 Units into the skin 3 (three) times daily before meals.  90 mL  3  . loperamide (IMODIUM) 2 MG capsule Take 2 mg by mouth 4 (four) times daily as needed for diarrhea or loose stools.      Marland Kitchen omeprazole (PRILOSEC) 20 MG capsule Take 20 mg by mouth daily.      . temazepam (RESTORIL) 30 MG capsule Take 30 mg by mouth at bedtime.      Marland Kitchen warfarin (COUMADIN) 5 MG tablet Take 2.5-5 mg by mouth every evening. Tues & Thurs take 2.5mg  & 5mg  on all other days      . ethyl chloride  spray Apply 1 application topically daily as needed. For pain      . Insulin Syringe-Needle U-100 (BD INSULIN SYRINGE ULTRAFINE) 31G X 5/16" 0.5 ML MISC 1 each by Other route 3 (three) times daily.  200 each  3    Results for orders placed during the hospital encounter of 01/18/13 (from the past 48 hour(s))  COMPREHENSIVE METABOLIC PANEL     Status: Abnormal   Collection Time    01/18/13 12:15 PM      Result Value Range   Sodium 139  135 - 145 mEq/L   Potassium 4.0  3.5 - 5.1 mEq/L   Chloride 94 (*) 96 - 112 mEq/L   CO2 27  19 - 32 mEq/L   Glucose, Bld 256 (*) 70 - 99 mg/dL   BUN 32 (*) 6 - 23 mg/dL   Creatinine, Ser 9.14 (*) 0.50 - 1.35 mg/dL   Calcium 9.4  8.4 - 78.2 mg/dL   Total Protein 7.6  6.0 - 8.3 g/dL   Albumin 2.8 (*) 3.5 - 5.2 g/dL   AST 19  0 - 37 U/L   ALT 17  0 - 53 U/L   Alkaline Phosphatase 114  39 - 117 U/L   Total Bilirubin 0.3  0.3 - 1.2 mg/dL   GFR calc non Af Amer 12 (*) >90 mL/min   GFR calc Af Amer 14 (*) >90 mL/min   Comment:            The eGFR has been calculated     using the CKD EPI equation.     This calculation has not been     validated in all clinical     situations.     eGFR's persistently     <90 mL/min signify     possible Chronic Kidney Disease.  CBC WITH DIFFERENTIAL     Status: Abnormal   Collection Time    01/18/13 12:15 PM      Result Value Range   WBC 15.9 (*) 4.0 - 10.5 K/uL   RBC 3.07 (*) 4.22 - 5.81 MIL/uL   Hemoglobin 8.9 (*) 13.0 - 17.0 g/dL   HCT 95.6 (*) 21.3 - 08.6 %   MCV 89.9  78.0 - 100.0 fL   MCH 29.0  26.0 - 34.0 pg   MCHC 32.2  30.0 - 36.0 g/dL   RDW 57.8 (*)  11.5 - 15.5 %   Platelets 611 (*) 150 - 400 K/uL   Neutrophils Relative % 83 (*) 43 - 77 %   Neutro Abs 13.1 (*) 1.7 - 7.7 K/uL   Lymphocytes Relative 10 (*) 12 - 46 %   Lymphs Abs 1.6  0.7 - 4.0 K/uL   Monocytes Relative 6  3 - 12 %   Monocytes Absolute 1.0  0.1 - 1.0 K/uL   Eosinophils Relative 1  0 - 5 %   Eosinophils Absolute 0.1  0.0 - 0.7 K/uL    Basophils Relative 1  0 - 1 %   Basophils Absolute 0.1  0.0 - 0.1 K/uL  GLUCOSE, CAPILLARY     Status: Abnormal   Collection Time    01/18/13  2:16 PM      Result Value Range   Glucose-Capillary 255 (*) 70 - 99 mg/dL   No results found.  Review of Systems  All other systems reviewed and are negative.    Blood pressure 155/79, pulse 112, temperature 98.7 F (37.1 C), temperature source Oral, resp. rate 18, SpO2 98.00%. Physical Exam  On examination patient has a good dorsalis pedis pulse by Doppler posterior tibial pulses not dopplerable. He has a new gangrenous ulcer to the left Achilles and a new gangrenous ulcer over the fifth metatarsal head. There is some mild redness around the skin graft on the left leg proximally. Assessment/Plan Assessment: Concern for systemic infection with new ulcerations to the left lower extremity cellulitis and gangrenous changes.  Plan: Will plan for irrigation debridement of the new wounds. Plan for repeat irrigation and debridement of his previous wound. Will consult vascular surgery for evaluation for possible vascular intervention if necessary. Patient does have a significant vascular status change to the left lower extremity.  Ashana Tullo V 01/18/2013, 3:15 PM

## 2013-01-18 NOTE — Transfer of Care (Signed)
Immediate Anesthesia Transfer of Care Note  Patient: [name].  Procedure(s) Performed: Procedure(s) with comments: IRRIGATION AND DEBRIDEMENT EXTREMITYLEFT LEG AND RIGHT FOOT (Bilateral) - Left Leg Excisional Debridement  Patient Location: PACU  Anesthesia Type:General  Level of Consciousness: awake, alert , oriented and patient cooperative  Airway & Oxygen Therapy: Patient Spontanous Breathing and Patient connected to nasal cannula oxygen  Post-op Assessment: Report given to PACU RN and Post -op Vital signs reviewed and stable  Post vital signs: Reviewed and stable  Complications: No apparent anesthesia complications

## 2013-01-18 NOTE — ED Notes (Signed)
Legs swelling and getting red for a few days, had surgery on the rt calf 2 weak got ulcers on left foot after that. Saw dr [name] and was sent here for further tests

## 2013-01-18 NOTE — Preoperative (Signed)
Beta Blockers   Reason not to administer Beta Blockers:Not Applicable 

## 2013-01-18 NOTE — ED Notes (Signed)
Pt being prepared to be taken to OR holding. Pt removed clothing. Pt took off sock from right foot and noticed an additional wound on outer right side of foot. Foul smell noted, yellow/red drainage noted, purple/blue discoloring around wound, and wound appears to be ulcer looking. Pt states this is the first time he has seen this wound. Pt states Dr [name] is not aware of this wound yet.

## 2013-01-18 NOTE — Progress Notes (Signed)
Orthopedic Tech Progress Note Patient Details:  [name]. [birth year] [id number]  Ortho Devices Ortho Device/Splint Location: (B) LE prafo boots Ortho Device/Splint Interventions: Ordered;Application   Jennye Moccasin 01/18/2013, 7:33 PM

## 2013-01-18 NOTE — Progress Notes (Signed)
ANTICOAGULATION CONSULT NOTE - Initial Consult  Pharmacy Consult for Coumadin Indication: atrial fibrillation  No Known Allergies  Patient Measurements: Height: 6' 0.05" (183 cm) Weight: 238 lb 1.6 oz (108 kg) IBW/kg (Calculated) : 77.71 Heparin Dosing Weight:   Vital Signs: Temp: 98.2 F (36.8 C) (08/07 1905) Temp src: Oral (08/07 1346) BP: 138/69 mmHg (08/07 1905) Pulse Rate: 92 (08/07 1905)  Labs:  Recent Labs  01/18/13 1215 01/18/13 1415  HGB 8.9*  --   HCT 27.6*  --   PLT 611*  --   APTT  --  46*  LABPROT  --  18.8*  INR  --  1.62*  CREATININE 4.75*  --     Estimated Creatinine Clearance: 20.5 ml/min (by C-G formula based on Cr of 4.75).   Medical History: Past Medical History  Diagnosis Date  . Chronic kidney disease     dialysis t,th,sat  . GERD (gastroesophageal reflux disease)   . Hypertension   . Neuromuscular disorder     neurophathy  . Atrial fibrillation     chronically anticoagulated with coumadin  . Hyperlipidemia   . Atrial flutter 07/07/2009    s/p ablation  . DIABETES MELLITUS, TYPE II 08/01/2008  . GOUT 08/01/2008  . ERECTILE DYSFUNCTION 08/01/2008  . PERIPHERAL NEUROPATHY 08/01/2008  . CORONARY ARTERY DISEASE 08/01/2008    non obstructive per cath 1/10; Dr. Juanda Chance  . 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 Gastroparesis 08/01/2008  . End stage renal failure on dialysis 08/01/2008  . NEPHROLITHIASIS, HX OF 08/01/2008  . Chronic systolic heart failure 06/21/2010  . COPD 07/21/2010  . Diverticulosis of colon (without mention of hemorrhage) 10/28/2010  . Benign neoplasm of colon 10/28/2010  . Hemorrhoids, internal 10/28/2010  . Renal cyst     bilateral  . Second degree Mobitz I AV block     asymptomatic  . Cyst     infected cyst on back   . Muscle spasm of back     back muscle spasms and pt is unable to lay flat   . Abscess of back     lower middle portion of back  . Headache(784.0)     HX; of migraines 25-30 years  ago  . Cancer     bBasal cell on nose    Medications:  Scheduled:  . allopurinol  100 mg Oral Daily  . gabapentin  300 mg Oral QHS  . HYDROmorphone      . [START ON 01/19/2013] insulin aspart  0-9 Units Subcutaneous TID WC  . [START ON 01/19/2013] insulin aspart  3 Units Subcutaneous TID WC  . [START ON 01/19/2013] lanthanum  2,000 mg Oral TID WC  . pantoprazole  40 mg Oral Daily  . piperacillin-tazobactam (ZOSYN)  IV  2.25 g Intravenous Q8H  . temazepam  30 mg Oral QHS  . vancomycin  1,000 mg Intravenous Once  . warfarin  5 mg Oral Once  . [START ON 01/19/2013] Warfarin - Pharmacist Dosing Inpatient   Does not apply q1800    Assessment: 62 yr old male was admitted for irrigation and debridement of wounds on his right foot and left leg. He takes coumadin for a.fib and his home dose is 2.5 mg on Tue and Thurs and 5 mg on Mon, Wed, Fri, Sat, Sun. His INR today is 1.62.  Goal of Therapy:  INR 2-3    Plan:  The INR is subtherapeutic today at 1.62. Instead of the 2.5 mg  the pt normally takes on Thurs, he will get 5 mg. Have ordered daily INR.  Eugene Garnet 01/18/2013,8:03 PM

## 2013-01-19 ENCOUNTER — Encounter (HOSPITAL_COMMUNITY): Payer: Self-pay | Admitting: General Practice

## 2013-01-19 DIAGNOSIS — L97909 Non-pressure chronic ulcer of unspecified part of unspecified lower leg with unspecified severity: Secondary | ICD-10-CM

## 2013-01-19 LAB — GLUCOSE, CAPILLARY
Glucose-Capillary: 420 mg/dL — ABNORMAL HIGH (ref 70–99)
Glucose-Capillary: 431 mg/dL — ABNORMAL HIGH (ref 70–99)

## 2013-01-19 LAB — PROTIME-INR: INR: 1.7 — ABNORMAL HIGH (ref 0.00–1.49)

## 2013-01-19 MED ORDER — SILVER SULFADIAZINE 1 % EX CREA
TOPICAL_CREAM | Freq: Every day | CUTANEOUS | Status: DC
  Administered 2013-01-19 – 2013-01-22 (×4): via TOPICAL
  Filled 2013-01-19 (×2): qty 85

## 2013-01-19 MED ORDER — WARFARIN SODIUM 5 MG PO TABS
5.0000 mg | ORAL_TABLET | Freq: Once | ORAL | Status: AC
  Administered 2013-01-19: 5 mg via ORAL
  Filled 2013-01-19: qty 1

## 2013-01-19 NOTE — Evaluation (Signed)
Physical Therapy Evaluation Patient Details Name: [name]. MRN: [id number] DOB: [birth year] Today's Date: 01/19/2013 Time: 5784-6962 PT Time Calculation (min): 14 min  PT Assessment / Plan / Recommendation History of Present Illness  Pt is a 62 y.o. 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 Patient will benefit from skilled PT to increase their independence and safety with mobility to allow discharge to the venue listed below. Pt highly motivated but will want to do activities his own way.      PT Assessment  Patient needs continued PT services    Follow Up Recommendations  No PT follow up;Supervision for mobility/OOB;Supervision - Intermittent    Does the patient have the potential to tolerate intense rehabilitation      Barriers to Discharge   none    Equipment Recommendations  None recommended by PT    Recommendations for Other Services     Frequency Min 4X/week    Precautions / Restrictions Precautions Precautions: Fall Precaution Comments: pt has history of falls  Required Braces or Orthoses: Other Brace/Splint Other Brace/Splint: Darco Shoe Rt LE; PRAFO Lt LE  Restrictions Weight Bearing Restrictions: Yes RLE Weight Bearing: Touchdown weight bearing Other Position/Activity Restrictions: Pt can put weight through Rt Heel per MD on phone; Pt is full WB on Lt LE    Pertinent Vitals/Pain 2/10 primarily in Lt surgical site       Mobility  Bed Mobility Bed Mobility: Supine to Sit;Sitting - Scoot to Edge of Bed Supine to Sit: HOB flat;6: Modified independent (Device/Increase time) Sitting - Scoot to Edge  of Bed: 6: Modified independent (Device/Increase time) Details for Bed Mobility Assistance: pt able to come to long sit and swing bil LEs off EOB without physical (A) or cues; HOB flat and hand rails removed to simulate home enviroment Transfers Transfers: Sit to Stand;Stand to Sit Sit to Stand: 5: Supervision;From bed Stand to Sit: 5: Supervision;To chair/3-in-1;With armrests Details for Transfer Assistance: no phyiscal (A) needed; supervision for vc's and safety  Ambulation/Gait Ambulation/Gait Assistance: 5: Supervision Ambulation Distance (Feet): 18 Feet Assistive device: Rolling walker Ambulation/Gait Assistance Details: vc's for gt sequencing and safety with RW; pt tends to ER Rt LE and  keep Rt LE outside of RW; states "this make me feel more stable"  Gait Pattern: Step-to pattern;Antalgic;Trunk flexed Gait velocity: decreased  General Gait Details: pt c/o PRAFO on Lt LE; states its not stable enough Stairs: No Wheelchair Mobility Wheelchair Mobility: No         PT Diagnosis: Difficulty walking;Acute pain  PT Problem List: Decreased strength;Decreased range of motion;Decreased balance;Decreased mobility;Decreased knowledge of use of DME;Pain PT Treatment Interventions: DME instruction;Gait training;Stair training;Functional mobility training;Therapeutic activities;Therapeutic exercise;Balance training;Neuromuscular re-education;Patient/family education     PT Goals(Current goals can be found in the care plan section) Acute Rehab PT Goals Patient Stated Goal: to go home monday PT Goal Formulation: With patient Potential to Achieve Goals: Good  Visit Information  Last PT Received On: 01/19/13 Assistance Needed: +1 History of Present Illness: Pt is a 62 y.o. male with a-fib, DM type 2, CAD, adm to hospital due  to fever and chills; pt developed nectoric wounds over Lt achilles and lateral aspect of Lt foot; pt is s/p I&D bil LEs for necrotic wounds; s/p Rt 5th metatarsal  amputation        Prior Functioning  Home Living Family/patient expects to be discharged to:: Private residence Living Arrangements: Spouse/significant other Available Help at Discharge: Family;Available PRN/intermittently Type of Home: House Home Access: Stairs to enter [employer] of Steps: 6-7 Entrance Stairs-Rails: Right Home Layout: One level Home Equipment: Walker - 2 wheels;Cane - single point;Crutches;Shower seat Prior Function Level of Independence: Independent with assistive device(s) Comments: uses SPC Communication Communication: No difficulties    Cognition  Cognition Arousal/Alertness: Awake/alert Behavior During Therapy: WFL for tasks assessed/performed Overall Cognitive Status: Within Functional Limits for tasks assessed    Extremity/Trunk Assessment Upper Extremity Assessment Upper Extremity Assessment: Overall WFL for tasks assessed Lower Extremity Assessment Lower Extremity Assessment: RLE deficits/detail;LLE deficits/detail;Generalized weakness (ankles limited ) RLE: Unable to fully assess due to pain LLE: Unable to fully assess due to pain Cervical / Trunk Assessment Cervical / Trunk Assessment: Kyphotic   Balance Balance Balance Assessed: Yes Static Sitting Balance Static Sitting - Balance Support: No upper extremity supported;Feet supported Static Sitting - Level of Assistance: 5: Stand by assistance Static Standing Balance Static Standing - Balance Support: Bilateral upper extremity supported;During functional activity Static Standing - Level of Assistance: 5: Stand by assistance  End of Session PT - End of Session Equipment Utilized During Treatment: Gait belt;Other (comment) (Darch shoe for Rt LE and PRAFO on Lt LE ) Activity Tolerance: Patient tolerated treatment well Patient left: in chair;with call bell/phone within reach Nurse Communication: Mobility status  GP     Donell Sievert, [place] [phone] 01/19/2013, 1:58 PM

## 2013-01-19 NOTE — Progress Notes (Signed)
Utilization Review Completed.[name] T8/01/2013

## 2013-01-19 NOTE — Progress Notes (Signed)
Patient ID: Perry Drum., male   DOB: 1950/06/23, 62 y.o.   MRN: [id number] Patient had gas-forming abscess right fifth metatarsal head. The necrotic tissue and bone was resected in one block of tissue. The wound was loosely closed. Patient also had new ischemic necrotic ulcers over the left Achilles and left fifth metatarsal head. There is no deep abscess in these locations. His previous surgical site left proximal calf was clean with no signs of infection.  Plan to continue IV antibiotics of vancomycin and Zosyn dosed per pharmacy through the weekend.  Start Silvadene dressing changes to wounds both lower extremities daily.  Ankle-brachial indices today. Okay to remove dressings to obtain ankle-brachial indices. Patient had diminished posterior tibial circulation on the left lower extremity and this is the leg that has the new necrotic ulcers.

## 2013-01-19 NOTE — Progress Notes (Signed)
ANTICOAGULATION CONSULT NOTE - Initial Consult  Pharmacy Consult for Coumadin Indication: atrial fibrillation  No Known Allergies  Patient Measurements: Height: 6' 0.05" (183 cm) Weight: 238 lb 1.6 oz (108 kg) IBW/kg (Calculated) : 77.71  Vital Signs: Temp: 98.6 F (37 C) (08/08 0521) BP: 119/65 mmHg (08/08 0521) Pulse Rate: 88 (08/08 0521)  Labs:  Recent Labs  01/18/13 1215 01/18/13 1415 01/19/13 0443  HGB 8.9*  --   --   HCT 27.6*  --   --   PLT 611*  --   --   APTT  --  46*  --   LABPROT  --  18.8* 19.5*  INR  --  1.62* 1.70*  CREATININE 4.75*  --   --     Estimated Creatinine Clearance: 20.5 ml/min (by C-G formula based on Cr of 4.75).   Assessment: 61 yr old male was admitted for irrigation and debridement of wounds on his right foot and left leg, Resumed coumadin for a.fib, INR 1.7, subtherapeutic but trending up. No bleeding noted per chart.  PTA dose: 2.5 mg on Tue and Thurs and 5 mg on Mon, Wed, Fri, Sat, Sun.   Goal of Therapy:  INR 2-3    Plan:  Coumadin 5mg  po x 1 F/u daily INR  Bayard Hugger, PharmD, BCPS  Clinical Pharmacist  Pager: [phone]   01/19/2013,10:20 AM

## 2013-01-19 NOTE — Progress Notes (Signed)
8/8  CBGs on 8/7  255-217 mg/dl   8/8 454 mg/dl Recommend starting basal insulin such as Lantus or Levemir 20 units daily based on 0.2 units per kg.  Last HgbA1C in July, 2013 was 8.7%.  Recheck HgbA1C.  Change Novolog correction scale to MODERATE scale AC & HS.  Smith Mince RN BSN CDE

## 2013-01-19 NOTE — Progress Notes (Signed)
VASCULAR LAB PRELIMINARY  ARTERIAL  ABI completed:    RIGHT    LEFT    PRESSURE WAVEFORM  PRESSURE WAVEFORM  BRACHIAL 168 Triphasic BRACHIAL Dialysis access Triphasic  AT > 300 Biphasic AT >300 Biphasic  PT >300 Triphasic PT >300  Biphasic    RIGHT LEFT  ABI N/A N/A   ABIs could not be ascertained due to incompressible vessels probably secondary to calcification. Doppler waveforms are within normal limits  Jesseca Marsch, RVS 01/19/2013, 2:10 PM

## 2013-01-19 NOTE — Progress Notes (Signed)
Orthopedic Tech Progress Note Patient Details:  [name]. 17-Apr-1951 [id number] Darco shoe requested by Dr. Waldron Labs Devices Type of Ortho Device: Darco shoe Ortho Device/Splint Location: (B) LE prafo boots Ortho Device/Splint Interventions: Application   [name] 01/19/2013, 12:55 PM

## 2013-01-19 NOTE — Progress Notes (Signed)
Came to visit patient on behalf of Link to [facility] Care Management program for employees/dependents with [facility] insurance. Unfortunately, [name] was off the unit. Left Link to Wellness packet at bedside for DM management and contact information. Will follow up telephonically.  [name], MSN-Ed, RN,BSN- [facility] [phone]

## 2013-01-20 LAB — GLUCOSE, CAPILLARY
Glucose-Capillary: 326 mg/dL — ABNORMAL HIGH (ref 70–99)
Glucose-Capillary: 296 mg/dL — ABNORMAL HIGH (ref 70–99)

## 2013-01-20 LAB — CBC
HCT: 25.2 % — ABNORMAL LOW (ref 39.0–52.0)
MCV: 91.6 fL (ref 78.0–100.0)
RBC: 2.75 MIL/uL — ABNORMAL LOW (ref 4.22–5.81)
WBC: 12.5 10*3/uL — ABNORMAL HIGH (ref 4.0–10.5)

## 2013-01-20 LAB — RENAL FUNCTION PANEL
Albumin: 2.4 g/dL — ABNORMAL LOW (ref 3.5–5.2)
BUN: 55 mg/dL — ABNORMAL HIGH (ref 6–23)
CO2: 25 mEq/L (ref 19–32)
Chloride: 92 mEq/L — ABNORMAL LOW (ref 96–112)
Creatinine, Ser: 7.83 mg/dL — ABNORMAL HIGH (ref 0.50–1.35)
GFR calc non Af Amer: 7 mL/min — ABNORMAL LOW (ref >90)
Potassium: 4.6 mEq/L (ref 3.5–5.1)

## 2013-01-20 MED ORDER — WARFARIN SODIUM 2.5 MG PO TABS
2.5000 mg | ORAL_TABLET | Freq: Once | ORAL | Status: AC
  Administered 2013-01-20: 2.5 mg via ORAL
  Filled 2013-01-20: qty 1

## 2013-01-20 NOTE — Progress Notes (Signed)
Physical Therapy Treatment Patient Details Name: [name]. MRN: [id number] DOB: [birth year] Today's Date: 01/20/2013 Time: 4098-1191 PT Time Calculation (min): 11 min  PT Assessment / Plan / Recommendation  History of Present Illness Pt is a 62 y.o. 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 He would like to know if there is another option so he feel more stable with ambulation. Will leave a sticky note for the MD. Otherwise, patient motivated and progressing as well as he can. Will have wifes assistance at home. Will need to attempt steps before DC home.   Follow Up Recommendations  No PT follow up;Supervision for mobility/OOB;Supervision - Intermittent     Does the patient have the potential to tolerate intense rehabilitation     Barriers to Discharge        Equipment Recommendations  None recommended by PT    Recommendations for Other Services    Frequency Min 4X/week   Progress towards PT Goals Progress towards PT goals: Progressing toward goals  Plan Current plan remains appropriate    Precautions / Restrictions Precautions Precautions: Fall Precaution Comments: pt has history of falls  Required Braces or Orthoses: Other Brace/Splint Other Brace/Splint: Darco Shoe Rt LE; PRAFO Lt LE  Restrictions RLE Weight Bearing: Touchdown weight bearing Other Position/Activity Restrictions: Pt can put weight through Rt Heel per MD on phone; Pt is full WB on Lt LE    Pertinent Vitals/Pain Discomfort in LLE with PRAFO boot   Mobility  Bed Mobility Sitting - Scoot to Edge of Bed: 6: Modified independent (Device/Increase time) Transfers Sit to  Stand: 5: Supervision;From bed Stand to Sit: 5: Supervision;To chair/3-in-1;With armrests Details for Transfer Assistance: no phyiscal (A) needed; supervision for vc's and safety  Ambulation/Gait Ambulation/Gait Assistance: 5: Supervision Ambulation Distance (Feet): 40 Feet Assistive device: Rolling walker Ambulation/Gait Assistance Details: Supervision for safety. Continues to keep R LE externally rotated and states he feels safer with wide BOS. Limited by discomfort and not feeling stable in Prafo on L foot Gait Pattern: Step-to pattern;Antalgic;Trunk flexed Gait velocity: decreased  General Gait Details: pt c/o PRAFO on Lt LE; states its not stable enough    Exercises     PT Diagnosis:    PT Problem List:   PT Treatment Interventions:     PT Goals (current goals can now be found in the care plan section)    Visit Information  Last PT Received On: 01/20/13 Assistance Needed: +1 History of Present Illness: Pt is a 62 y.o. 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

## 2013-01-20 NOTE — Progress Notes (Signed)
ANTICOAGULATION CONSULT NOTE - Initial Consult  Pharmacy Consult for Coumadin Indication: atrial fibrillation  No Known Allergies  Patient Measurements: Height: 6' 0.05" (183 cm) Weight: 238 lb 1.6 oz (108 kg) IBW/kg (Calculated) : 77.71  Vital Signs: Temp: 98.1 F (36.7 C) (08/09 0531) BP: 133/72 mmHg (08/09 0531) Pulse Rate: 89 (08/09 0531)  Labs:  Recent Labs  01/18/13 1215 01/18/13 1415 01/19/13 0443 01/20/13 0545  HGB 8.9*  --   --   --   HCT 27.6*  --   --   --   PLT 611*  --   --   --   APTT  --  46*  --   --   LABPROT  --  18.8* 19.5* 24.6*  INR  --  1.62* 1.70* 2.31*  CREATININE 4.75*  --   --   --     Estimated Creatinine Clearance: 20.5 ml/min (by C-G formula based on Cr of 4.75).   Assessment: 62 yr old male was admitted for irrigation and debridement of wounds on his right foot and left leg, Resumed coumadin for a.fib, INR 2.3, therapeutic this am, but pt is renal pt, and the INR increased by 0.5. Will decrease dose for today.  No bleeding noted per chart.  PTA dose: 2.5 mg on Tue and Thurs and 5 mg on Mon, Wed, Fri, Sat, Sun.   Goal of Therapy:  INR 2-3    Plan:  -Coumadin 2.5mg  po x 1 -F/u daily INR  Anabel Bene, PharmD Clinical Pharmacist Pager: [phone]    01/20/2013,8:21 AM

## 2013-01-20 NOTE — Progress Notes (Signed)
Subjective: 2 Days Post-Op Procedure(s) (LRB): IRRIGATION AND DEBRIDEMENT EXTREMITYLEFT LEG AND RIGHT FOOT (Bilateral) Patient reports pain as mild.    Objective: Vital signs in last 24 hours: Temp:  [97.6 F (36.4 C)-98.9 F (37.2 C)] 97.6 F (36.4 C) (08/09 1450) Pulse Rate:  [89-100] 92 (08/09 1450) Resp:  [16] 16 (08/09 1450) BP: (111-165)/(55-72) 165/67 mmHg (08/09 1450) SpO2:  [94 %-97 %] 96 % (08/09 1450)  Intake/Output from previous day: 08/08 0701 - 08/09 0700 In: 540 [P.O.:300; I.V.:240] Out: -  Intake/Output this shift: Total I/O In: 720 [P.O.:720] Out: 325 [Urine:325]   Recent Labs  01/18/13 1215 01/20/13 0932  HGB 8.9* 7.9*    Recent Labs  01/18/13 1215 01/20/13 0932  WBC 15.9* 12.5*  RBC 3.07* 2.75*  HCT 27.6* 25.2*  PLT 611* 598*    Recent Labs  01/18/13 1215 01/20/13 0935  NA 139 135  K 4.0 4.6  CL 94* 92*  CO2 27 25  BUN 32* 55*  CREATININE 4.75* 7.83*  GLUCOSE 256* 327*  CALCIUM 9.4 9.2    Recent Labs  01/19/13 0443 01/20/13 0545  INR 1.70* 2.31*    dressing dry  Assessment/Plan: 2 Days Post-Op Procedure(s) (LRB): IRRIGATION AND DEBRIDEMENT EXTREMITYLEFT LEG AND RIGHT FOOT (Bilateral) Continue ABX therapy due to Post-op infection chronic diabetes with ulceration.   Teoman Giraud C 01/20/2013, 5:47 PM

## 2013-01-21 DIAGNOSIS — E119 Type 2 diabetes mellitus without complications: Secondary | ICD-10-CM

## 2013-01-21 LAB — WOUND CULTURE

## 2013-01-21 LAB — GLUCOSE, CAPILLARY: Glucose-Capillary: 228 mg/dL — ABNORMAL HIGH (ref 70–99)

## 2013-01-21 MED ORDER — INSULIN ASPART 100 UNIT/ML ~~LOC~~ SOLN
3.0000 [IU] | Freq: Once | SUBCUTANEOUS | Status: AC
  Administered 2013-01-21: 3 [IU] via SUBCUTANEOUS

## 2013-01-21 MED ORDER — WARFARIN SODIUM 2.5 MG PO TABS
2.5000 mg | ORAL_TABLET | Freq: Once | ORAL | Status: AC
  Administered 2013-01-21: 2.5 mg via ORAL
  Filled 2013-01-21: qty 1

## 2013-01-21 NOTE — Progress Notes (Signed)
Pharmacy Consult for Vancomycin/Zosyn + Coumadin Indication: leg and foot abscess + a fib  No Known Allergies  Patient Measurements: Height: 6' 0.05" (183 cm) Weight: 238 lb 1.6 oz (108 kg) IBW/kg (Calculated) : 77.71  Vital Signs: Temp: 98.5 F (36.9 C) (08/10 0541) BP: 168/73 mmHg (08/10 0541) Pulse Rate: 129 (08/10 0541) Intake/Output from previous day: 08/09 0701 - 08/10 0700 In: 1200 [P.O.:1200] Out: 925 [Urine:925] Intake/Output from this shift:    Labs:  Recent Labs  01/18/13 1215 01/20/13 0932 01/20/13 0935  WBC 15.9* 12.5*  --   HGB 8.9* 7.9*  --   PLT 611* 598*  --   CREATININE 4.75*  --  7.83*   Estimated Creatinine Clearance: 12.4 ml/min (by C-G formula based on Cr of 7.83). No results found for this basename: Rolm Gala, [place], GENTTROUGH, GENTPEAK, GENTRANDOM, TOBRATROUGH, TOBRAPEAK, TOBRARND, AMIKACINPEAK, AMIKACINTROU, AMIKACIN,  in the last 72 hours   Recent Labs   01/18/13 1215  01/18/13 1415  01/19/13 0443  01/20/13 0545   HGB  8.9*  --  --  --   HCT  27.6*  --  --  --   PLT  611*  --  --  --   APTT  --  46*  --  --   LABPROT  --  18.8*  19.5*  24.6*   INR  --  1.62*  1.70*  2.31*   CREATININE  4.75*  --  --  --    INR 8/10: 2.57  Microbiology: Recent Results (from the past 720 hour(s))  WOUND CULTURE     Status: None   Collection Time    01/18/13  4:26 PM      Result Value Range Status   Specimen Description WOUND RIGHT FOOT   Final   Special Requests PATIENT ON FOLLOWING ZOSYN VANC   Final   Gram Stain     Final   Value: FEW WBC PRESENT, PREDOMINANTLY PMN     NO SQUAMOUS EPITHELIAL CELLS SEEN     FEW GRAM POSITIVE COCCI IN PAIRS     Performed at [facility]   Culture     Final   Value: MULTIPLE ORGANISMS PRESENT, NONE PREDOMINANT     Note: NO STAPHYLOCOCCUS AUREUS ISOLATED NO GROUP A STREP (S.PYOGENES) ISOLATED     Performed at [facility]   Report Status 01/21/2013 FINAL   Final   ANAEROBIC CULTURE     Status: None   Collection Time    01/18/13  4:26 PM      Result Value Range Status   Specimen Description WOUND RIGHT FOOT   Final   Special Requests PATIENT ON FOLLOWING VANC ZOSYN   Final   Gram Stain     Final   Value: RARE WBC PRESENT,BOTH PMN AND MONONUCLEAR     NO SQUAMOUS EPITHELIAL CELLS SEEN     FEW GRAM POSITIVE COCCI     IN PAIRS     Performed at [facility]   Culture     Final   Value: NO ANAEROBES ISOLATED; CULTURE IN PROGRESS FOR 5 DAYS     Performed at [facility]   Report Status PENDING   Incomplete    Medical History: Past Medical History  Diagnosis Date  . Chronic kidney disease     dialysis t,th,sat  . GERD (gastroesophageal reflux disease)   . Hypertension   . Neuromuscular disorder     neurophathy  . Atrial fibrillation  chronically anticoagulated with coumadin  . Hyperlipidemia   . Atrial flutter 07/07/2009    s/p ablation  . DIABETES MELLITUS, TYPE II 08/01/2008  . GOUT 08/01/2008  . ERECTILE DYSFUNCTION 08/01/2008  . PERIPHERAL NEUROPATHY 08/01/2008  . CORONARY ARTERY DISEASE 08/01/2008    non obstructive per cath 1/10; Dr. Juanda Chance  . 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 Gastroparesis 08/01/2008  . End stage renal failure on dialysis 08/01/2008  . NEPHROLITHIASIS, HX OF 08/01/2008  . Chronic systolic heart failure 06/21/2010  . COPD 07/21/2010  . Diverticulosis of colon (without mention of hemorrhage) 10/28/2010  . Benign neoplasm of colon 10/28/2010  . Hemorrhoids, internal 10/28/2010  . Renal cyst     bilateral  . Second degree Mobitz I AV block     asymptomatic  . Cyst     infected cyst on back   . Muscle spasm of back     back muscle spasms and pt is unable to lay flat   . Abscess of back     lower middle portion of back  . Headache(784.0)     HX; of migraines 25-30 years ago  . Cancer     bBasal cell on nose    Medications:  Prescriptions prior to admission  Medication  Sig Dispense Refill  . allopurinol (ZYLOPRIM) 100 MG tablet Take 1 tablet by mouth daily.       . B Complex-C-Folic Acid (DIALYVITE PO) Take 100 mg by mouth daily.        Marland Kitchen FOSRENOL 1000 MG chewable tablet Chew 2 tablets by mouth 3 (three) times daily with meals.       . gabapentin (NEURONTIN) 300 MG capsule Take 1 capsule (300 mg total) by mouth at bedtime.  30 capsule  11  . HYDROcodone-acetaminophen (NORCO/VICODIN) 5-325 MG per tablet Take 0.5-1 tablets by mouth every 4 (four) hours as needed for pain. For pain      . insulin aspart (NOVOLOG FLEXPEN) 100 UNIT/ML injection Inject 30 Units into the skin 3 (three) times daily before meals.  90 mL  3  . loperamide (IMODIUM) 2 MG capsule Take 2 mg by mouth 4 (four) times daily as needed for diarrhea or loose stools.      Marland Kitchen omeprazole (PRILOSEC) 20 MG capsule Take 20 mg by mouth daily.      . temazepam (RESTORIL) 30 MG capsule Take 30 mg by mouth at bedtime.      Marland Kitchen warfarin (COUMADIN) 5 MG tablet Take 2.5-5 mg by mouth every evening. Tues & Thurs take 2.5mg  & 5mg  on all other days      . ethyl chloride spray Apply 1 application topically daily as needed. For pain      . Insulin Syringe-Needle U-100 (BD INSULIN SYRINGE ULTRAFINE) 31G X 5/16" 0.5 ML MISC 1 each by Other route 3 (three) times daily.  200 each  3   Scheduled:  . allopurinol  100 mg Oral Daily  . gabapentin  300 mg Oral QHS  . insulin aspart  0-9 Units Subcutaneous TID WC  . insulin aspart  3 Units Subcutaneous TID WC  . lanthanum  2,000 mg Oral TID WC  . pantoprazole  40 mg Oral Daily  . piperacillin-tazobactam (ZOSYN)  IV  2.25 g Intravenous Q8H  . silver sulfADIAZINE   Topical Daily  . temazepam  30 mg Oral QHS  . Warfarin - Pharmacist Dosing Inpatient   Does not apply q1800   Infusions:  .  sodium chloride 20 mL/hr at 01/20/13 0700   Assessment: 62 y/o male HD patient admitted with left leg and right foot abscess s/p  right foot fifth ray amputation, I&D,  and was put on  Vanc and Zosyn.  Pt received vanc 2g load, pt is HD dependant but no HD since admission.  Unsure of HD plans for pt, will hold off on Vanc until HD plans are known, Vanc level likely still therapeutic since load.  Will continue Zosyn on correct current HD dose.  Patient also is on chronic coumadin for h/o afib that has been resumed on 8/9.  INR 2.57, therapeutic this am, h/h low stable, plt still elevated, will continue dose for today. No bleeding noted per chart.   PTA dose: 2.5 mg on Tue and Thurs and 5 mg on Mon, Wed, Fri, Sat, Sun.    Goal of Therapy:  Pre HD vanc level =15-25 INR 2-3  Plan:  -Continue Zosyn 2.25g IV q8h - f/u for subsequent HD sessions/plans for Vanc dosing -Continue Coumadin 2.5mg  po x 1  -F/u daily INR   Anabel Bene, PharmD Clinical Pharmacist Pager: [phone]

## 2013-01-21 NOTE — Progress Notes (Signed)
Subjective: 3 Days Post-Op Procedure(s) (LRB): IRRIGATION AND DEBRIDEMENT EXTREMITYLEFT LEG AND RIGHT FOOT (Bilateral) Patient reports pain as mild.    Objective: Vital signs in last 24 hours: Temp:  [97.6 F (36.4 C)-98.5 F (36.9 C)] 98.5 F (36.9 C) (08/10 0541) Pulse Rate:  [63-129] 129 (08/10 0541) Resp:  [16] 16 (08/10 0541) BP: (136-168)/(67-73) 168/73 mmHg (08/10 0541) SpO2:  [96 %-100 %] 100 % (08/10 0541)  Intake/Output from previous day: 08/09 0701 - 08/10 0700 In: 1200 [P.O.:1200] Out: 925 [Urine:925] Intake/Output this shift:     Recent Labs  01/18/13 1215 01/20/13 0932  HGB 8.9* 7.9*    Recent Labs  01/18/13 1215 01/20/13 0932  WBC 15.9* 12.5*  RBC 3.07* 2.75*  HCT 27.6* 25.2*  PLT 611* 598*    Recent Labs  01/18/13 1215 01/20/13 0935  NA 139 135  K 4.0 4.6  CL 94* 92*  CO2 27 25  BUN 32* 55*  CREATININE 4.75* 7.83*  GLUCOSE 256* 327*  CALCIUM 9.4 9.2    Recent Labs  01/20/13 0545 01/21/13 0635  INR 2.31* 2.57*    Compartment soft  Assessment/Plan: 3 Days Post-Op Procedure(s) (LRB): IRRIGATION AND DEBRIDEMENT EXTREMITYLEFT LEG AND RIGHT FOOT (Bilateral) Plan:    Continue IV ABX.     Cultures Staph Aureus no sensitivities yet . PCR ordered to determine if nasal carrier and decide on treatement.   YATES,MARK C 01/21/2013, 9:18 AM

## 2013-01-22 ENCOUNTER — Encounter (HOSPITAL_COMMUNITY): Payer: Self-pay | Admitting: Orthopedic Surgery

## 2013-01-22 LAB — PROTIME-INR
INR: 2.94 — ABNORMAL HIGH (ref 0.00–1.49)
Prothrombin Time: 29.6 seconds — ABNORMAL HIGH (ref 11.6–15.2)

## 2013-01-22 LAB — GLUCOSE, CAPILLARY

## 2013-01-22 MED ORDER — SILVER SULFADIAZINE 1 % EX CREA: TOPICAL_CREAM | Freq: Every day | CUTANEOUS | Status: DC

## 2013-01-22 MED ORDER — DOXYCYCLINE HYCLATE 50 MG PO CAPS: 100.0000 mg | ORAL_CAPSULE | Freq: Two times a day (BID) | ORAL | Status: DC

## 2013-01-22 MED ORDER — HYDROCODONE-ACETAMINOPHEN 5-325 MG PO TABS: 1.0000 | ORAL_TABLET | Freq: Four times a day (QID) | ORAL | Status: DC | PRN

## 2013-01-22 NOTE — Progress Notes (Signed)
ANTICOAGULATION CONSULT NOTE - Follow Up Consult  Pharmacy Consult for warfarin Indication: atrial fibrillation  No Known Allergies  Patient Measurements: Height: 6' 0.05" (183 cm) Weight: 238 lb 1.6 oz (108 kg) IBW/kg (Calculated) : 77.71   Vital Signs: Temp: 98.2 F (36.8 C) (08/11 0713) BP: 179/85 mmHg (08/11 0713) Pulse Rate: 120 (08/11 0713)  Labs:  Recent Labs  01/20/13 0545 01/20/13 0932 01/20/13 0935 01/21/13 0635 01/22/13 0500  HGB  --  7.9*  --   --   --   HCT  --  25.2*  --   --   --   PLT  --  598*  --   --   --   LABPROT 24.6*  --   --  26.7* 29.6*  INR 2.31*  --   --  2.57* 2.94*  CREATININE  --   --  7.83*  --   --     Estimated Creatinine Clearance: 12.4 ml/min (by C-G formula based on Cr of 7.83).   Assessment: 62 YOM on chronic warfarin for AFib- home dose is 5mg  all days except 2.5mg  on Tuesdays and Thursdays. INR this morning was 2.94- up from yesterday's which was 2.57. No bleeding noted. Last CBC was 8/9 which was low, but platelets were elevated. 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 He is to be discharged today and it is noted that he is to take doxycycline 100mg  BID on discharge (was on Vancomycin and Zosyn here). Patient is a TTS HD patient who has not received HD during this hospitalization and therefore only received a loading dose of vancomycin. Last WBC is from 8/9 and was slightly elevated at 12.5-pt has remained afebrile   Goal of Therapy:  INR 2-3 Monitor platelets by anticoagulation protocol: Yes   Plan:  1. Note on discharge summary that plan is to resume home warfarin dosing- will not enter a dose for tonight 2. Patient will need CLOSE follow-up of INR has doxycycline increases INR and patient can be at increased risk for bleed.  Tiago Humphrey D. Candon Caras, PharmD Clinical Pharmacist Pager: [phone] 01/22/2013 10:23 AM

## 2013-01-22 NOTE — Progress Notes (Signed)
Physical Therapy Treatment Patient Details Name: [name]. MRN: [id number] DOB: 11/29/1950 Today's Date: 01/22/2013 Time: 4098-1191 PT Time Calculation (min): 25 min  PT Assessment / Plan / Recommendation  History of Present Illness Pt is a 62 y.o. 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 Still unsatisfied with PRAFO being the only option for LLE at this time. Patient does not plan to be up and walking at home unless absolutely necessary. Able to complete stair training without issues. Assisted with balance in getting dressed. PLans on DC later today  Follow Up Recommendations  No PT follow up;Supervision for mobility/OOB;Supervision - Intermittent     Does the patient have the potential to tolerate intense rehabilitation     Barriers to Discharge        Equipment Recommendations  None recommended by PT    Recommendations for Other Services    Frequency Min 4X/week   Progress towards PT Goals Progress towards PT goals: Progressing toward goals  Plan Current plan remains appropriate    Precautions / Restrictions Precautions Precautions: Fall Precaution Comments: pt has history of falls  Required Braces or Orthoses: Other Brace/Splint Other Brace/Splint: Darco Shoe Rt LE; PRAFO Lt LE  Restrictions Other Position/Activity Restrictions: Pt can put weight through Rt Heel per MD on phone; Pt is full WB on Lt LE    Pertinent Vitals/Pain no apparent distress    Mobility  Bed Mobility Supine to Sit: 7: Independent Sitting - Scoot to Edge of Bed: 7: Independent Transfers Sit to Stand: 5: Supervision;From bed Stand to Sit: 5: Supervision;To chair/3-in-1;With armrests Details for Transfer Assistance: supervision for  safety Ambulation/Gait Ambulation/Gait Assistance: 5: Supervision Ambulation Distance (Feet): 20 Feet Ambulation/Gait Assistance Details: limited by discomfort of PRAFO Gait Pattern: Step-to pattern;Trunk flexed General Gait Details: pt c/o PRAFO on Lt LE; states its not stable enough Stairs: Yes Stairs Assistance: 4: Min guard Stair Management Technique: Step to pattern;Forwards;Two rails Number of Stairs: 5    Exercises     PT Diagnosis:    PT Problem List:   PT Treatment Interventions:     PT Goals (current goals can now be found in the care plan section)    Visit Information  Last PT Received On: 01/22/13 Assistance Needed: +1 History of Present Illness: Pt is a 62 y.o. 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

## 2013-01-22 NOTE — Progress Notes (Signed)
09/22/12 Per PT evaluation, no follow up or equipment needed. [facility] case manager to follow patient at home. Jacquelynn Cree RN, BSN, CCM

## 2013-01-22 NOTE — Progress Notes (Addendum)
Spoke with Mr [name] via phone prior to discharge. Came to visit on Friday 01/19/13 but he was out of the room. Left Link to Wellness packet at bedside at that time. He states he is not sure if he has it. Will mail him another Link to [employer]. Confirmed best contact number for him as well as his address for mailing information. Reports he has spoken with the pharmacist about the Link to Wellness program but he has not signed up as of yet. Mr [name] states he is okay with this Clinical research associate calling him on Wednesday to follow up with him post discharge. Will find out at that time to see if he will be agreeable to have an appointment made with the Link to [facility] Manager for diabetes management.  [name], MSN-Ed, RN,BSN- [facility] [phone]

## 2013-01-22 NOTE — Discharge Summary (Signed)
Physician Discharge Summary  Patient ID: [name]. MRN: [id number] DOB/AGE: 1950-09-12 62 y.o.  Admit date: 01/18/2013 Discharge date: 01/22/2013  Admission Diagnoses: Ulceration left lower extremity osteomyelitis abscess right lower extremity  Discharge Diagnoses: Ulceration left lower extremity osteomyelitis abscess right lower extremity with osteomyelitis fifth metatarsal Active Problems:   Diabetes   Discharged Condition: stable  Hospital Course: Patient's hospital course was essentially unremarkable. He underwent excisional debridement of necrotic wounds left lower extremity. He underwent a right foot fifth ray amputation due to abscess osteomyelitis. Patient's ankle brachial indices shows calcified vessels but did show biphasic and triphasic flow. His cultures were positive for gram-positive bacteria but did not show staph or MRSA. Patient will be discharged to home with oral antibiotics and wound care dressing changes.  Consults: None  Significant Diagnostic Studies: labs: Labs included ankle-brachial indices which showed calcified vessels noncompressible with biphasic and triphasic flow. Labs were positive for gram-positive bacteria but showed no MRSA or staph aureus  Treatments: surgery: See operative note  Discharge Exam: Blood pressure 158/87, pulse 99, temperature 98.6 F (37 C), temperature source Oral, resp. rate 18, height 6' 0.05" (1.83 m), weight 108 kg (238 lb 1.6 oz), SpO2 100.00%. Incision/Wound: dressings clean dry and intact  Disposition: 01-Home or Self Care  Discharge Orders   Future Appointments Provider Department Dept Phone   02/16/2013 11:15 AM Romero Belling, MD [facility]IMARY [facility] ENDOCRINOLOGY [phone]   Future Orders Complete By Expires     Call MD / Call 911  As directed     Comments:      If you experience chest pain or shortness of breath, CALL 911 and be transported to the hospital emergency room.  If you develope a fever above 101 F, pus  (white drainage) or increased drainage or redness at the wound, or calf pain, call your surgeon's office.    Constipation Prevention  As directed     Comments:      Drink plenty of fluids.  Prune juice may be helpful.  You may use a stool softener, such as Colace (over the counter) 100 mg twice a day.  Use MiraLax (over the counter) for constipation as needed.    Diet - low sodium heart healthy  As directed     Increase activity slowly as tolerated  As directed         Medication List         allopurinol 100 MG tablet  Commonly known as:  ZYLOPRIM  Take 1 tablet by mouth daily.     COUMADIN 5 MG tablet  Generic drug:  warfarin  Take 2.5-5 mg by mouth every evening. Tues & Thurs take 2.5mg  & 5mg  on all other days     DIALYVITE PO  Take 100 mg by mouth daily.     doxycycline 50 MG capsule  Commonly known as:  VIBRAMYCIN  Take 2 capsules (100 mg total) by mouth 2 (two) times daily.     ethyl chloride spray  Apply 1 application topically daily as needed. For pain     FOSRENOL 1000 MG chewable tablet  Generic drug:  lanthanum  Chew 2 tablets by mouth 3 (three) times daily with meals.     gabapentin 300 MG capsule  Commonly known as:  NEURONTIN  Take 1 capsule (300 mg total) by mouth at bedtime.     HYDROcodone-acetaminophen 5-325 MG per tablet  Commonly known as:  NORCO/VICODIN  Take 0.5-1 tablets by mouth every 4 (four) hours  as needed for pain. For pain     HYDROcodone-acetaminophen 5-325 MG per tablet  Commonly known as:  NORCO  Take 1 tablet by mouth every 6 (six) hours as needed for pain.     insulin aspart 100 UNIT/ML injection  Commonly known as:  NOVOLOG FLEXPEN  Inject 30 Units into the skin 3 (three) times daily before meals.     Insulin Syringe-Needle U-100 31G X 5/16" 0.5 ML Misc  Commonly known as:  BD INSULIN SYRINGE ULTRAFINE  1 each by Other route 3 (three) times daily.     loperamide 2 MG capsule  Commonly known as:  IMODIUM  Take 2 mg by mouth 4  (four) times daily as needed for diarrhea or loose stools.     omeprazole 20 MG capsule  Commonly known as:  PRILOSEC  Take 20 mg by mouth daily.     silver sulfADIAZINE 1 % cream  Commonly known as:  SILVADENE  Apply topically daily.     temazepam 30 MG capsule  Commonly known as:  RESTORIL  Take 30 mg by mouth at bedtime.           Follow-up Information   Follow up with DUDA,MARCUS V, MD In 1 week.   Contact information:   [address] [place] [address] [phone]       Signed: Nadara Mustard 01/22/2013, 6:33 AM

## 2013-01-23 LAB — ANAEROBIC CULTURE

## 2013-01-25 ENCOUNTER — Ambulatory Visit (INDEPENDENT_AMBULATORY_CARE_PROVIDER_SITE_OTHER): Payer: 59 | Admitting: Cardiology

## 2013-01-25 DIAGNOSIS — Z7901 Long term (current) use of anticoagulants: Secondary | ICD-10-CM

## 2013-01-25 DIAGNOSIS — I4891 Unspecified atrial fibrillation: Secondary | ICD-10-CM

## 2013-01-31 ENCOUNTER — Telehealth: Payer: Self-pay | Admitting: *Deleted

## 2013-01-31 ENCOUNTER — Encounter (HOSPITAL_COMMUNITY): Payer: Self-pay | Admitting: *Deleted

## 2013-01-31 NOTE — Telephone Encounter (Signed)
02/02/2013----AMPUTATION BELOW KNEE Left  By Dr Aldean Baker  Patient states he is having left leg amputation on 02/02/2013, he is asking when he should stop his coumadin, have informed him to stop per surgeons instructions, states they haven't given him any instructions regarding stopping coumadin, patient is going to call Dr Aldean Baker office, I will call Dr Audrie Lia office also. Per Elnita Maxwell, Dr Burna Sis scheduler, Dr [name] does not take patients off for amputation but for only joint replacement. I have informed patient of the above.

## 2013-02-01 ENCOUNTER — Other Ambulatory Visit (HOSPITAL_COMMUNITY): Payer: Self-pay | Admitting: Orthopedic Surgery

## 2013-02-01 MED ORDER — CEFAZOLIN SODIUM-DEXTROSE 2-3 GM-% IV SOLR
2.0000 g | INTRAVENOUS | Status: AC
  Administered 2013-02-02: 2 g via INTRAVENOUS
  Filled 2013-02-01: qty 50

## 2013-02-02 ENCOUNTER — Encounter (HOSPITAL_COMMUNITY): Payer: Self-pay | Admitting: Vascular Surgery

## 2013-02-02 ENCOUNTER — Encounter (HOSPITAL_COMMUNITY): Payer: Self-pay | Admitting: *Deleted

## 2013-02-02 ENCOUNTER — Inpatient Hospital Stay (HOSPITAL_COMMUNITY)
Admission: RE | Admit: 2013-02-02 | Discharge: 2013-02-05 | DRG: 239 | Disposition: A | Discharge status: Skilled Nursing Facility | Payer: 59 | Source: Ambulatory Visit | Attending: Orthopedic Surgery | Admitting: Orthopedic Surgery

## 2013-02-02 ENCOUNTER — Inpatient Hospital Stay (HOSPITAL_COMMUNITY): Payer: 59 | Admitting: Vascular Surgery

## 2013-02-02 ENCOUNTER — Encounter (HOSPITAL_COMMUNITY)
Admission: RE | Disposition: A | Discharge status: Skilled Nursing Facility | Payer: Self-pay | Source: Ambulatory Visit | Attending: Orthopedic Surgery

## 2013-02-02 DIAGNOSIS — J4489 Other specified chronic obstructive pulmonary disease: Secondary | ICD-10-CM | POA: Diagnosis present

## 2013-02-02 DIAGNOSIS — L97909 Non-pressure chronic ulcer of unspecified part of unspecified lower leg with unspecified severity: Secondary | ICD-10-CM | POA: Diagnosis present

## 2013-02-02 DIAGNOSIS — E1169 Type 2 diabetes mellitus with other specified complication: Secondary | ICD-10-CM | POA: Diagnosis present

## 2013-02-02 DIAGNOSIS — E785 Hyperlipidemia, unspecified: Secondary | ICD-10-CM | POA: Diagnosis present

## 2013-02-02 DIAGNOSIS — Z7901 Long term (current) use of anticoagulants: Secondary | ICD-10-CM

## 2013-02-02 DIAGNOSIS — M899 Disorder of bone, unspecified: Secondary | ICD-10-CM | POA: Diagnosis present

## 2013-02-02 DIAGNOSIS — Z89519 Acquired absence of unspecified leg below knee: Secondary | ICD-10-CM

## 2013-02-02 DIAGNOSIS — K219 Gastro-esophageal reflux disease without esophagitis: Secondary | ICD-10-CM | POA: Diagnosis present

## 2013-02-02 DIAGNOSIS — I798 Other disorders of arteries, arterioles and capillaries in diseases classified elsewhere: Secondary | ICD-10-CM | POA: Diagnosis present

## 2013-02-02 DIAGNOSIS — I4891 Unspecified atrial fibrillation: Secondary | ICD-10-CM | POA: Diagnosis present

## 2013-02-02 DIAGNOSIS — D631 Anemia in chronic kidney disease: Secondary | ICD-10-CM | POA: Diagnosis present

## 2013-02-02 DIAGNOSIS — I5022 Chronic systolic (congestive) heart failure: Secondary | ICD-10-CM | POA: Diagnosis present

## 2013-02-02 DIAGNOSIS — Z992 Dependence on renal dialysis: Secondary | ICD-10-CM

## 2013-02-02 DIAGNOSIS — F17200 Nicotine dependence, unspecified, uncomplicated: Secondary | ICD-10-CM | POA: Diagnosis present

## 2013-02-02 DIAGNOSIS — M109 Gout, unspecified: Secondary | ICD-10-CM | POA: Diagnosis present

## 2013-02-02 DIAGNOSIS — M86172 Other acute osteomyelitis, left ankle and foot: Secondary | ICD-10-CM

## 2013-02-02 DIAGNOSIS — M9080 Osteopathy in diseases classified elsewhere, unspecified site: Secondary | ICD-10-CM | POA: Diagnosis present

## 2013-02-02 DIAGNOSIS — G547 Phantom limb syndrome without pain: Secondary | ICD-10-CM | POA: Diagnosis not present

## 2013-02-02 DIAGNOSIS — S98139A Complete traumatic amputation of one unspecified lesser toe, initial encounter: Secondary | ICD-10-CM

## 2013-02-02 DIAGNOSIS — E1159 Type 2 diabetes mellitus with other circulatory complications: Principal | ICD-10-CM | POA: Diagnosis present

## 2013-02-02 DIAGNOSIS — Z794 Long term (current) use of insulin: Secondary | ICD-10-CM

## 2013-02-02 DIAGNOSIS — I120 Hypertensive chronic kidney disease with stage 5 chronic kidney disease or end stage renal disease: Secondary | ICD-10-CM | POA: Diagnosis present

## 2013-02-02 DIAGNOSIS — E119 Type 2 diabetes mellitus without complications: Secondary | ICD-10-CM

## 2013-02-02 DIAGNOSIS — I96 Gangrene, not elsewhere classified: Secondary | ICD-10-CM | POA: Diagnosis present

## 2013-02-02 DIAGNOSIS — M86679 Other chronic osteomyelitis, unspecified ankle and foot: Secondary | ICD-10-CM | POA: Diagnosis present

## 2013-02-02 DIAGNOSIS — E1142 Type 2 diabetes mellitus with diabetic polyneuropathy: Secondary | ICD-10-CM | POA: Diagnosis present

## 2013-02-02 DIAGNOSIS — L02619 Cutaneous abscess of unspecified foot: Secondary | ICD-10-CM | POA: Diagnosis present

## 2013-02-02 DIAGNOSIS — E1149 Type 2 diabetes mellitus with other diabetic neurological complication: Secondary | ICD-10-CM | POA: Diagnosis present

## 2013-02-02 DIAGNOSIS — I2510 Atherosclerotic heart disease of native coronary artery without angina pectoris: Secondary | ICD-10-CM | POA: Diagnosis present

## 2013-02-02 DIAGNOSIS — N186 End stage renal disease: Secondary | ICD-10-CM | POA: Diagnosis present

## 2013-02-02 DIAGNOSIS — J449 Chronic obstructive pulmonary disease, unspecified: Secondary | ICD-10-CM | POA: Diagnosis present

## 2013-02-02 DIAGNOSIS — Z79899 Other long term (current) drug therapy: Secondary | ICD-10-CM

## 2013-02-02 HISTORY — PX: AMPUTATION: SHX166

## 2013-02-02 HISTORY — DX: Acquired absence of unspecified leg below knee: Z89.519

## 2013-02-02 LAB — COMPREHENSIVE METABOLIC PANEL
ALT: 8 U/L (ref 0–53)
AST: 11 U/L (ref 0–37)
Calcium: 10.2 mg/dL (ref 8.4–10.5)
GFR calc Af Amer: 13 mL/min — ABNORMAL LOW (ref >90)
Sodium: 140 mEq/L (ref 135–145)
Total Protein: 7.6 g/dL (ref 6.0–8.3)

## 2013-02-02 LAB — CBC
MCH: 28.4 pg (ref 26.0–34.0)
MCHC: 30.5 g/dL (ref 30.0–36.0)
Platelets: 444 10*3/uL — ABNORMAL HIGH (ref 150–400)

## 2013-02-02 LAB — GLUCOSE, CAPILLARY
Glucose-Capillary: 198 mg/dL — ABNORMAL HIGH (ref 70–99)
Glucose-Capillary: 202 mg/dL — ABNORMAL HIGH (ref 70–99)
Glucose-Capillary: 230 mg/dL — ABNORMAL HIGH (ref 70–99)

## 2013-02-02 SURGERY — AMPUTATION BELOW KNEE
Anesthesia: General | Site: Leg Lower | Laterality: Left | Wound class: Dirty or Infected

## 2013-02-02 MED ORDER — HYDROMORPHONE HCL PF 1 MG/ML IJ SOLN
INTRAMUSCULAR | Status: AC
  Administered 2013-02-02: 0.5 mg via INTRAVENOUS
  Filled 2013-02-02: qty 1

## 2013-02-02 MED ORDER — DOXERCALCIFEROL 4 MCG/2ML IV SOLN
6.0000 ug | INTRAVENOUS | Status: DC
  Filled 2013-02-02: qty 4

## 2013-02-02 MED ORDER — HYDROMORPHONE HCL PF 1 MG/ML IJ SOLN
INTRAMUSCULAR | Status: DC | PRN
  Administered 2013-02-02: 1 mg via INTRAVENOUS

## 2013-02-02 MED ORDER — OXYCODONE-ACETAMINOPHEN 5-325 MG PO TABS
ORAL_TABLET | ORAL | Status: AC
  Administered 2013-02-02: 2 via ORAL
  Filled 2013-02-02: qty 2

## 2013-02-02 MED ORDER — GABAPENTIN 300 MG PO CAPS
300.0000 mg | ORAL_CAPSULE | Freq: Three times a day (TID) | ORAL | Status: DC
  Administered 2013-02-02 – 2013-02-05 (×8): 300 mg via ORAL
  Filled 2013-02-02 (×11): qty 1

## 2013-02-02 MED ORDER — HYDROCODONE-ACETAMINOPHEN 5-325 MG PO TABS
1.0000 | ORAL_TABLET | ORAL | Status: DC | PRN
  Administered 2013-02-02 – 2013-02-05 (×11): 2 via ORAL
  Filled 2013-02-02 (×11): qty 2

## 2013-02-02 MED ORDER — LANTHANUM CARBONATE 500 MG PO CHEW
2000.0000 mg | CHEWABLE_TABLET | Freq: Three times a day (TID) | ORAL | Status: DC
  Administered 2013-02-02 – 2013-02-05 (×5): 2000 mg via ORAL
  Filled 2013-02-02 (×11): qty 4

## 2013-02-02 MED ORDER — ONDANSETRON HCL 4 MG/2ML IJ SOLN: 4.0000 mg | Freq: Four times a day (QID) | INTRAMUSCULAR | Status: DC | PRN

## 2013-02-02 MED ORDER — HYDROMORPHONE HCL PF 1 MG/ML IJ SOLN
INTRAMUSCULAR | Status: AC
  Filled 2013-02-02: qty 1

## 2013-02-02 MED ORDER — METOCLOPRAMIDE HCL 10 MG PO TABS: 5.0000 mg | ORAL_TABLET | Freq: Three times a day (TID) | ORAL | Status: DC | PRN

## 2013-02-02 MED ORDER — METOCLOPRAMIDE HCL 5 MG/ML IJ SOLN: 5.0000 mg | Freq: Three times a day (TID) | INTRAMUSCULAR | Status: DC | PRN

## 2013-02-02 MED ORDER — PROPOFOL 10 MG/ML IV BOLUS
INTRAVENOUS | Status: DC | PRN
  Administered 2013-02-02: 200 mg via INTRAVENOUS

## 2013-02-02 MED ORDER — CALCIUM ACETATE 667 MG PO CAPS
667.0000 mg | ORAL_CAPSULE | Freq: Three times a day (TID) | ORAL | Status: DC
  Administered 2013-02-02 – 2013-02-05 (×5): 667 mg via ORAL
  Filled 2013-02-02 (×11): qty 1

## 2013-02-02 MED ORDER — FENTANYL CITRATE 0.05 MG/ML IJ SOLN
INTRAMUSCULAR | Status: DC | PRN
  Administered 2013-02-02 (×5): 50 ug via INTRAVENOUS

## 2013-02-02 MED ORDER — HYDROMORPHONE HCL PF 1 MG/ML IJ SOLN
0.5000 mg | INTRAMUSCULAR | Status: DC | PRN
  Administered 2013-02-02 – 2013-02-04 (×8): 1 mg via INTRAVENOUS
  Filled 2013-02-02 (×10): qty 1

## 2013-02-02 MED ORDER — TEMAZEPAM 15 MG PO CAPS
30.0000 mg | ORAL_CAPSULE | Freq: Every day | ORAL | Status: DC
  Administered 2013-02-02 – 2013-02-05 (×3): 30 mg via ORAL
  Filled 2013-02-02 (×3): qty 2

## 2013-02-02 MED ORDER — 0.9 % SODIUM CHLORIDE (POUR BTL) OPTIME
TOPICAL | Status: DC | PRN
  Administered 2013-02-02: 1000 mL

## 2013-02-02 MED ORDER — INSULIN ASPART 100 UNIT/ML ~~LOC~~ SOLN
0.0000 [IU] | Freq: Three times a day (TID) | SUBCUTANEOUS | Status: DC
  Administered 2013-02-02: 5 [IU] via SUBCUTANEOUS
  Administered 2013-02-03 – 2013-02-04 (×3): 3 [IU] via SUBCUTANEOUS
  Administered 2013-02-04: 2 [IU] via SUBCUTANEOUS
  Administered 2013-02-04 – 2013-02-05 (×2): 5 [IU] via SUBCUTANEOUS
  Administered 2013-02-05: 3 [IU] via SUBCUTANEOUS

## 2013-02-02 MED ORDER — INSULIN ASPART 100 UNIT/ML ~~LOC~~ SOLN
3.0000 [IU] | Freq: Three times a day (TID) | SUBCUTANEOUS | Status: DC
  Administered 2013-02-02 – 2013-02-05 (×8): 3 [IU] via SUBCUTANEOUS

## 2013-02-02 MED ORDER — ONDANSETRON HCL 4 MG PO TABS: 4.0000 mg | ORAL_TABLET | Freq: Four times a day (QID) | ORAL | Status: DC | PRN

## 2013-02-02 MED ORDER — MIDAZOLAM HCL 5 MG/5ML IJ SOLN
INTRAMUSCULAR | Status: DC | PRN
  Administered 2013-02-02: 1 mg via INTRAVENOUS

## 2013-02-02 MED ORDER — DARBEPOETIN ALFA-POLYSORBATE 100 MCG/0.5ML IJ SOLN
100.0000 ug | INTRAMUSCULAR | Status: DC
  Filled 2013-02-02: qty 0.5

## 2013-02-02 MED ORDER — NA FERRIC GLUC CPLX IN SUCROSE 12.5 MG/ML IV SOLN
125.0000 mg | INTRAVENOUS | Status: DC
  Administered 2013-02-03: 125 mg via INTRAVENOUS
  Filled 2013-02-02 (×2): qty 10

## 2013-02-02 MED ORDER — SUCCINYLCHOLINE CHLORIDE 20 MG/ML IJ SOLN
INTRAMUSCULAR | Status: DC | PRN
  Administered 2013-02-02: 100 mg via INTRAVENOUS

## 2013-02-02 MED ORDER — LIDOCAINE HCL (CARDIAC) 20 MG/ML IV SOLN
INTRAVENOUS | Status: DC | PRN
  Administered 2013-02-02: 50 mg via INTRAVENOUS

## 2013-02-02 MED ORDER — CEFAZOLIN SODIUM-DEXTROSE 2-3 GM-% IV SOLR
2.0000 g | Freq: Two times a day (BID) | INTRAVENOUS | Status: AC
  Administered 2013-02-02: 2 g via INTRAVENOUS
  Filled 2013-02-02: qty 50

## 2013-02-02 MED ORDER — HYDROMORPHONE HCL PF 1 MG/ML IJ SOLN
1.0000 mg | Freq: Once | INTRAMUSCULAR | Status: AC
  Administered 2013-02-02: 1 mg via INTRAVENOUS
  Filled 2013-02-02: qty 1

## 2013-02-02 MED ORDER — WARFARIN SODIUM 2.5 MG PO TABS: 2.5000 mg | ORAL_TABLET | Freq: Every evening | ORAL | Status: DC

## 2013-02-02 MED ORDER — LOPERAMIDE HCL 2 MG PO CAPS
2.0000 mg | ORAL_CAPSULE | Freq: Four times a day (QID) | ORAL | Status: DC | PRN
  Filled 2013-02-02: qty 1

## 2013-02-02 MED ORDER — SODIUM CHLORIDE 0.9 % IV SOLN: INTRAVENOUS | Status: DC

## 2013-02-02 MED ORDER — ALLOPURINOL 100 MG PO TABS
100.0000 mg | ORAL_TABLET | Freq: Every day | ORAL | Status: DC
  Administered 2013-02-02 – 2013-02-05 (×4): 100 mg via ORAL
  Filled 2013-02-02 (×4): qty 1

## 2013-02-02 MED ORDER — WARFARIN - PHARMACIST DOSING INPATIENT: Freq: Every day | Status: DC

## 2013-02-02 MED ORDER — PHENYLEPHRINE HCL 10 MG/ML IJ SOLN
INTRAMUSCULAR | Status: DC | PRN
  Administered 2013-02-02: 80 ug via INTRAVENOUS

## 2013-02-02 MED ORDER — ONDANSETRON HCL 4 MG/2ML IJ SOLN
INTRAMUSCULAR | Status: DC | PRN
  Administered 2013-02-02: 4 mg via INTRAVENOUS

## 2013-02-02 MED ORDER — PANTOPRAZOLE SODIUM 40 MG PO TBEC
40.0000 mg | DELAYED_RELEASE_TABLET | Freq: Every day | ORAL | Status: DC
  Administered 2013-02-02 – 2013-02-05 (×4): 40 mg via ORAL
  Filled 2013-02-02 (×3): qty 1

## 2013-02-02 MED ORDER — OXYCODONE-ACETAMINOPHEN 5-325 MG PO TABS
1.0000 | ORAL_TABLET | ORAL | Status: DC | PRN
  Administered 2013-02-02 – 2013-02-03 (×3): 2 via ORAL
  Filled 2013-02-02 (×4): qty 2

## 2013-02-02 MED ORDER — ARTIFICIAL TEARS OP OINT
TOPICAL_OINTMENT | OPHTHALMIC | Status: DC | PRN
  Administered 2013-02-02: 1 via OPHTHALMIC

## 2013-02-02 MED ORDER — SODIUM CHLORIDE 0.9 % IV SOLN
INTRAVENOUS | Status: DC
  Administered 2013-02-02: 10:00:00 via INTRAVENOUS

## 2013-02-02 MED ORDER — GABAPENTIN 300 MG PO CAPS
300.0000 mg | ORAL_CAPSULE | Freq: Every day | ORAL | Status: DC
  Filled 2013-02-02: qty 1

## 2013-02-02 MED ORDER — HYDROMORPHONE HCL PF 1 MG/ML IJ SOLN
0.2500 mg | INTRAMUSCULAR | Status: DC | PRN
  Administered 2013-02-02 (×4): 0.5 mg via INTRAVENOUS

## 2013-02-02 MED ORDER — WARFARIN SODIUM 7.5 MG PO TABS
7.5000 mg | ORAL_TABLET | Freq: Once | ORAL | Status: AC
  Administered 2013-02-02: 7.5 mg via ORAL
  Filled 2013-02-02 (×2): qty 1

## 2013-02-02 SURGICAL SUPPLY — 46 items
BANDAGE ESMARK 6X9 LF (GAUZE/BANDAGES/DRESSINGS) ×1 IMPLANT
BANDAGE GAUZE ELAST BULKY 4 IN (GAUZE/BANDAGES/DRESSINGS) ×3 IMPLANT
BLADE SAW RECIP 87.9 MT (BLADE) ×2 IMPLANT
BLADE SURG 21 STRL SS (BLADE) ×2 IMPLANT
BNDG CMPR 9X6 STRL LF SNTH (GAUZE/BANDAGES/DRESSINGS) ×1
BNDG COHESIVE 6X5 TAN STRL LF (GAUZE/BANDAGES/DRESSINGS) ×3 IMPLANT
BNDG ESMARK 6X9 LF (GAUZE/BANDAGES/DRESSINGS) ×2
CLOTH BEACON ORANGE TIMEOUT ST (SAFETY) ×2 IMPLANT
COVER SURGICAL LIGHT HANDLE (MISCELLANEOUS) ×2 IMPLANT
CUFF TOURNIQUET SINGLE 34IN LL (TOURNIQUET CUFF) IMPLANT
CUFF TOURNIQUET SINGLE 44IN (TOURNIQUET CUFF) IMPLANT
DRAIN PENROSE 1/2X12 LTX STRL (WOUND CARE) IMPLANT
DRAPE EXTREMITY T 121X128X90 (DRAPE) ×2 IMPLANT
DRAPE PROXIMA HALF (DRAPES) ×4 IMPLANT
DRAPE U-SHAPE 47X51 STRL (DRAPES) ×4 IMPLANT
DRSG ADAPTIC 3X8 NADH LF (GAUZE/BANDAGES/DRESSINGS) ×2 IMPLANT
DRSG PAD ABDOMINAL 8X10 ST (GAUZE/BANDAGES/DRESSINGS) ×2 IMPLANT
DURAPREP 26ML APPLICATOR (WOUND CARE) ×2 IMPLANT
ELECT REM PT RETURN 9FT ADLT (ELECTROSURGICAL) ×2
ELECTRODE REM PT RTRN 9FT ADLT (ELECTROSURGICAL) ×1 IMPLANT
EVACUATOR 1/8 PVC DRAIN (DRAIN) IMPLANT
GLOVE BIOGEL PI IND STRL 9 (GLOVE) ×1 IMPLANT
GLOVE BIOGEL PI INDICATOR 9 (GLOVE) ×1
GLOVE SURG ORTHO 9.0 STRL STRW (GLOVE) ×2 IMPLANT
GOWN PREVENTION PLUS XLARGE (GOWN DISPOSABLE) ×2 IMPLANT
GOWN SRG XL XLNG 56XLVL 4 (GOWN DISPOSABLE) ×1 IMPLANT
GOWN STRL NON-REIN XL XLG LVL4 (GOWN DISPOSABLE) ×2
KIT BASIN OR (CUSTOM PROCEDURE TRAY) ×2 IMPLANT
KIT ROOM TURNOVER OR (KITS) ×2 IMPLANT
MANIFOLD NEPTUNE II (INSTRUMENTS) ×2 IMPLANT
NS IRRIG 1000ML POUR BTL (IV SOLUTION) ×2 IMPLANT
PACK GENERAL/GYN (CUSTOM PROCEDURE TRAY) ×2 IMPLANT
PAD ARMBOARD 7.5X6 YLW CONV (MISCELLANEOUS) ×4 IMPLANT
SPONGE GAUZE 4X4 12PLY (GAUZE/BANDAGES/DRESSINGS) ×2 IMPLANT
SPONGE LAP 18X18 X RAY DECT (DISPOSABLE) ×1 IMPLANT
STAPLER VISISTAT 35W (STAPLE) IMPLANT
STOCKINETTE IMPERVIOUS LG (DRAPES) ×2 IMPLANT
SUT ETHILON 2 0 PSLX (SUTURE) ×2 IMPLANT
SUT PDS AB 1 CT  36 (SUTURE) ×2
SUT PDS AB 1 CT 36 (SUTURE) IMPLANT
SUT SILK 2 0 (SUTURE) ×2
SUT SILK 2-0 18XBRD TIE 12 (SUTURE) ×1 IMPLANT
TOWEL OR 17X24 6PK STRL BLUE (TOWEL DISPOSABLE) ×2 IMPLANT
TOWEL OR 17X26 10 PK STRL BLUE (TOWEL DISPOSABLE) ×2 IMPLANT
TUBE ANAEROBIC SPECIMEN COL (MISCELLANEOUS) IMPLANT
WATER STERILE IRR 1000ML POUR (IV SOLUTION) ×1 IMPLANT

## 2013-02-02 NOTE — Progress Notes (Signed)
02/02/13 0907  OBSTRUCTIVE SLEEP APNEA  Have you ever been diagnosed with sleep apnea through a sleep study? No  Do you snore loudly (loud enough to be heard through closed doors)?  0  Do you often feel tired, fatigued, or sleepy during the daytime? 0  Has anyone observed you stop breathing during your sleep? 0  Do you have, or are you being treated for high blood pressure? 1  BMI more than 35 kg/m2? 0  Age over 62 years old? 1  Neck circumference greater than 40 cm/18 inches? 1  Gender: 1  Obstructive Sleep Apnea Score 4  Score 4 or greater  Results sent to PCP

## 2013-02-02 NOTE — Consult Note (Signed)
Reason for Consult: To manage dialysis and dialysis related needs Referring Physician: Janeece Fitting. is an 62 y.o. male with PMhx significant fotr HTN, hyperlipidemia, a fib, CAD, COPD as well as ESRD- on HD at [facility] on TTS via AVF.  He presents today with chronic ulceration and osteomyelitis requiring a BKA done by Dr. [name] today.  He is surprisingly alert after surgery- jsut c/o occasional shooting pains, he is hungry- his AVf is patent   Dialyzes at AF  EDW 106.5. HD Bath 2K/2.25 calc, Dialyzer 180, Heparin doesn't get. Access left upper AVF.  Past Medical History  Diagnosis Date  . Chronic kidney disease     dialysis t,th,sat  . GERD (gastroesophageal reflux disease)   . Hypertension   . Neuromuscular disorder     neurophathy  . Atrial fibrillation     chronically anticoagulated with coumadin  . Hyperlipidemia   . Atrial flutter 07/07/2009    s/p ablation  . DIABETES MELLITUS, TYPE II 08/01/2008  . GOUT 08/01/2008  . ERECTILE DYSFUNCTION 08/01/2008  . PERIPHERAL NEUROPATHY 08/01/2008  . CORONARY ARTERY DISEASE 08/01/2008    non obstructive per cath 1/10; Dr. Juanda Chance  . 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 Gastroparesis 08/01/2008  . End stage renal failure on dialysis 08/01/2008  . NEPHROLITHIASIS, HX OF 08/01/2008  . Chronic systolic heart failure 06/21/2010  . COPD 07/21/2010  . Diverticulosis of colon (without mention of hemorrhage) 10/28/2010  . Benign neoplasm of colon 10/28/2010  . Hemorrhoids, internal 10/28/2010  . Renal cyst     bilateral  . Second degree Mobitz I AV block     asymptomatic  . Cyst     infected cyst on back   . Muscle spasm of back     back muscle spasms and pt is unable to lay flat   . Abscess of back     lower middle portion of back  . Headache(784.0)     HX; of migraines 25-30 years ago  . Cancer     bBasal cell on nose    Past Surgical History  Procedure Laterality Date  . Atrial ablation surgery  07-2008, 07/08/09     Dr Johney Frame  . Right foot  09-2009    infection  . Colonoscopy  42yrs ago    Dr.Orr  . Av fistula placement      left upper arm  . S/p right arm surgery after fracture    . Pilonidal cyst excision      s/p  . Cataract extraction      Hx: right eye  . Tonsillectomy    . I&d extremity Left 12/29/2012    Procedure: IRRIGATION AND DEBRIDEMENT EXTREMITY;  Surgeon: Nadara Mustard, MD;  Location: [facility] OR;  Service: Orthopedics;  Laterality: Left;  Irrigation and Debridement Left Leg, VAC, [place]askin  . Toe amputation Right 01/18/2013    Dr [name]  . I&d extremity Bilateral 01/18/2013    Procedure: IRRIGATION AND DEBRIDEMENT EXTREMITYLEFT LEG AND RIGHT FOOT;  Surgeon: Nadara Mustard, MD;  Location: [facility] OR;  Service: Orthopedics;  Laterality: Bilateral;  Left Leg Excisional Debridement  . Skin graft Left     left leg within the past month    Family History  Problem Relation Age of Onset  . Diabetes Mother   . Diabetes Maternal Grandmother   . Diabetes Paternal Grandmother   . Cancer Neg Hx     no FH of Colon  Cancer    Social History:  reports that he has been smoking Cigarettes.  He has a 20 pack-year smoking history. He has never used smokeless tobacco. He reports that he does not drink alcohol or use illicit drugs.  Allergies: No Known Allergies  Medications: I have reviewed the patient's current medications. Hectorol 6 mcg with tx Epogen 8000 units - last hgb 8.3 Venofer 100 per week  Results for orders placed during the hospital encounter of 02/02/13 (from the past 48 hour(s))  GLUCOSE, CAPILLARY     Status: Abnormal   Collection Time    02/02/13  8:55 AM      Result Value Range   Glucose-Capillary 198 (*) 70 - 99 mg/dL  APTT     Status: Abnormal   Collection Time    02/02/13  9:00 AM      Result Value Range   aPTT 51 (*) 24 - 37 seconds   Comment:            IF BASELINE aPTT IS ELEVATED,     SUGGEST PATIENT RISK ASSESSMENT     BE USED TO DETERMINE APPROPRIATE      ANTICOAGULANT THERAPY.  CBC     Status: Abnormal   Collection Time    02/02/13  9:00 AM      Result Value Range   WBC 11.3 (*) 4.0 - 10.5 K/uL   RBC 3.17 (*) 4.22 - 5.81 MIL/uL   Hemoglobin 9.0 (*) 13.0 - 17.0 g/dL   HCT 40.9 (*) 81.1 - 91.4 %   MCV 93.1  78.0 - 100.0 fL   MCH 28.4  26.0 - 34.0 pg   MCHC 30.5  30.0 - 36.0 g/dL   RDW 78.2 (*) 95.6 - 21.3 %   Platelets 444 (*) 150 - 400 K/uL  COMPREHENSIVE METABOLIC PANEL     Status: Abnormal   Collection Time    02/02/13  9:00 AM      Result Value Range   Sodium 140  135 - 145 mEq/L   Potassium 3.8  3.5 - 5.1 mEq/L   Chloride 95 (*) 96 - 112 mEq/L   CO2 30  19 - 32 mEq/L   Glucose, Bld 207 (*) 70 - 99 mg/dL   BUN 29 (*) 6 - 23 mg/dL   Creatinine, Ser 0.86 (*) 0.50 - 1.35 mg/dL   Calcium 57.8  8.4 - 46.9 mg/dL   Total Protein 7.6  6.0 - 8.3 g/dL   Albumin 3.2 (*) 3.5 - 5.2 g/dL   AST 11  0 - 37 U/L   ALT 8  0 - 53 U/L   Alkaline Phosphatase 82  39 - 117 U/L   Total Bilirubin 0.4  0.3 - 1.2 mg/dL   GFR calc non Af Amer 11 (*) >90 mL/min   GFR calc Af Amer 13 (*) >90 mL/min   Comment: (NOTE)     The eGFR has been calculated using the CKD EPI equation.     This calculation has not been validated in all clinical situations.     eGFR's persistently <90 mL/min signify possible Chronic Kidney     Disease.  PROTIME-INR     Status: Abnormal   Collection Time    02/02/13  9:00 AM      Result Value Range   Prothrombin Time 18.5 (*) 11.6 - 15.2 seconds   INR 1.59 (*) 0.00 - 1.49  GLUCOSE, CAPILLARY     Status: Abnormal   Collection Time  02/02/13 12:19 PM      Result Value Range   Glucose-Capillary 202 (*) 70 - 99 mg/dL    No results found.  ROS: Full comprehensive ROS done, really only positive for shooting pains in left leg.  Denies fevers, chills, nausea, vomiting, constipation, diatrrrhea, CP, SOB, cough, abdominal pain.  All othe ROS was negative  Blood pressure 162/79, pulse 103, temperature 97.7 F (36.5 C),  temperature source Oral, resp. rate 16, height 6' (1.829 m), weight 105.6 kg (232 lb 12.9 oz), SpO2 97.00%. General appearance: alert, cooperative and no distress Eyes: conjunctivae/corneas clear. PERRL, EOM's intact. Fundi benign. Neck: no adenopathy, no carotid bruit, no JVD, supple, symmetrical, trachea midline and thyroid not enlarged, symmetric, no tenderness/mass/nodules Resp: clear to auscultation bilaterally Cardio: regular rate and rhythm, S1, S2 normal, no murmur, click, rub or gallop GI: soft, non-tender; bowel sounds normal; no masses,  no organomegaly Extremities: right leg, no edema- left leg wrapped s/p amputation Skin: Skin color, texture, turgor normal. No rashes or lesions left upper arm AVF with a good thrill and bruit  Assessment/Plan: 62 year old WM with multiple medical issues including ESRD- he now is s/p BKA for chronic osteo 1 s/p BKA for chronic osteo- per Dr. [name]- pain control.  Given ancef perioperatively. 2 ESRD: normally TTS at [facility] via AVF- no particular problems with HD, will continue with home regimen- profile #4. 3 Hypertension/volume: right now a little hypertensive.  Possibly due to pain.  On no BP meds as OP- BP pretty well controlled as OP with volume only- will follow 4. Anemia of ESRD: hgb was low coming into this.  Will continue weekly venofer and increase ESA dosing 5. Metabolic Bone Disease: continue home meds of hectorol/phoslo and fosrenol.  Phos and PTH not well controlled as OP (8.3 and 492 repectively) 6. On chronic coumadin for A fib 7. Gout- continue OP allopurinol 8. DM- looks like only on SSI for now   [name] A 02/02/2013, 2:19 PM

## 2013-02-02 NOTE — Progress Notes (Signed)
ANTICOAGULATION CONSULT NOTE - Initial Consult  Pharmacy Consult for Coumadin Indication: atrial fibrillation  No Known Allergies  Patient Measurements: Height: 6' (182.9 cm) Weight: 232 lb 12.9 oz (105.6 kg) IBW/kg (Calculated) : 77.6 Heparin Dosing Weight: n/a  Vital Signs: Temp: 97.7 F (36.5 C) (08/22 1328) Temp src: Oral (08/22 0856) BP: 162/79 mmHg (08/22 1328) Pulse Rate: 103 (08/22 1328)  Labs:  Recent Labs  02/02/13 0900  HGB 9.0*  HCT 29.5*  PLT 444*  APTT 51*  LABPROT 18.5*  INR 1.59*  CREATININE 4.97*    Estimated Creatinine Clearance: 19.4 ml/min (by C-G formula based on Cr of 4.97).   Medical History: Past Medical History  Diagnosis Date  . Chronic kidney disease     dialysis t,th,sat  . GERD (gastroesophageal reflux disease)   . Hypertension   . Neuromuscular disorder     neurophathy  . Atrial fibrillation     chronically anticoagulated with coumadin  . Hyperlipidemia   . Atrial flutter 07/07/2009    s/p ablation  . DIABETES MELLITUS, TYPE II 08/01/2008  . GOUT 08/01/2008  . ERECTILE DYSFUNCTION 08/01/2008  . PERIPHERAL NEUROPATHY 08/01/2008  . CORONARY ARTERY DISEASE 08/01/2008    non obstructive per cath 1/10; Dr. Juanda Chance  . 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 Gastroparesis 08/01/2008  . End stage renal failure on dialysis 08/01/2008  . NEPHROLITHIASIS, HX OF 08/01/2008  . Chronic systolic heart failure 06/21/2010  . COPD 07/21/2010  . Diverticulosis of colon (without mention of hemorrhage) 10/28/2010  . Benign neoplasm of colon 10/28/2010  . Hemorrhoids, internal 10/28/2010  . Renal cyst     bilateral  . Second degree Mobitz I AV block     asymptomatic  . Cyst     infected cyst on back   . Muscle spasm of back     back muscle spasms and pt is unable to lay flat   . Abscess of back     lower middle portion of back  . Headache(784.0)     HX; of migraines 25-30 years ago  . Cancer     bBasal cell on nose     Medications:  Scheduled:  . allopurinol  100 mg Oral Daily  . calcium acetate  667 mg Oral TID WC  .  ceFAZolin (ANCEF) IV  2 g Intravenous Q12H  . [START ON 02/03/2013] darbepoetin (ARANESP) injection - DIALYSIS  100 mcg Intravenous Q Sat-HD  . [START ON 02/03/2013] doxercalciferol  6 mcg Intravenous Q T,Th,Sa-HD  . [START ON 02/03/2013] ferric gluconate (FERRLECIT/NULECIT) IV  125 mg Intravenous Q Sat-HD  . gabapentin  300 mg Oral TID  . HYDROmorphone      . HYDROmorphone      . insulin aspart  0-15 Units Subcutaneous TID WC  . insulin aspart  3 Units Subcutaneous TID WC  . lanthanum  2,000 mg Oral TID WC  . pantoprazole  40 mg Oral Daily  . temazepam  30 mg Oral QHS    Assessment: 62 yo male on Coumadin for chronic afib.  S/p OR today for left transtibial amputation due to necrotic wound.  Per patient, last dose of Coumadin was last night.  INR subtherapeutic, but he denies remembering any missed doses.  Does reporting eating a large serving of turnip greens recently.  Home Coumadin dose = 5 mg daily except 2.5 mg Tues and Thus.  Goal of Therapy:  INR 2-3 Monitor platelets by anticoagulation protocol: Yes  Plan:  1. Coumadin 7.5 mg po x 1 tonight. 2. Daily PT/INR.  Tad Moore, BCPS  Clinical Pharmacist Pager [phone]  02/02/2013 3:32 PM

## 2013-02-02 NOTE — H&P (Signed)
[name]. is an 62 y.o. male.   Chief Complaint: Osteomyelitis abscess left foot HPI: Patient is a 62 year old gentleman with diabetic insensate neuropathy chronic ulceration and osteomyelitis who has failed prolonged conservative therapy and presents at this time for transtibial amputation.  Past Medical History  Diagnosis Date  . Chronic kidney disease     dialysis t,th,sat  . GERD (gastroesophageal reflux disease)   . Hypertension   . Neuromuscular disorder     neurophathy  . Atrial fibrillation     chronically anticoagulated with coumadin  . Hyperlipidemia   . Atrial flutter 07/07/2009    s/p ablation  . DIABETES MELLITUS, TYPE II 08/01/2008  . GOUT 08/01/2008  . ERECTILE DYSFUNCTION 08/01/2008  . PERIPHERAL NEUROPATHY 08/01/2008  . CORONARY ARTERY DISEASE 08/01/2008    non obstructive per cath 1/10; Dr. Juanda Chance  . 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 Gastroparesis 08/01/2008  . End stage renal failure on dialysis 08/01/2008  . NEPHROLITHIASIS, HX OF 08/01/2008  . Chronic systolic heart failure 06/21/2010  . COPD 07/21/2010  . Diverticulosis of colon (without mention of hemorrhage) 10/28/2010  . Benign neoplasm of colon 10/28/2010  . Hemorrhoids, internal 10/28/2010  . Renal cyst     bilateral  . Second degree Mobitz I AV block     asymptomatic  . Cyst     infected cyst on back   . Muscle spasm of back     back muscle spasms and pt is unable to lay flat   . Abscess of back     lower middle portion of back  . Headache(784.0)     HX; of migraines 25-30 years ago  . Cancer     bBasal cell on nose    Past Surgical History  Procedure Laterality Date  . Atrial ablation surgery  07-2008, 07/08/09    Dr Johney Frame  . Right foot  09-2009    infection  . Colonoscopy  73yrs ago    Dr.Orr  . Av fistula placement      left upper arm  . S/p right arm surgery after fracture    . Pilonidal cyst excision      s/p  . Cataract extraction      Hx: right eye  .  Tonsillectomy    . I&d extremity Left 12/29/2012    Procedure: IRRIGATION AND DEBRIDEMENT EXTREMITY;  Surgeon: Nadara Mustard, MD;  Location: [facility] OR;  Service: Orthopedics;  Laterality: Left;  Irrigation and Debridement Left Leg, VAC, [place]askin  . Toe amputation Right 01/18/2013    Dr [name]  . I&d extremity Bilateral 01/18/2013    Procedure: IRRIGATION AND DEBRIDEMENT EXTREMITYLEFT LEG AND RIGHT FOOT;  Surgeon: Nadara Mustard, MD;  Location: [facility] OR;  Service: Orthopedics;  Laterality: Bilateral;  Left Leg Excisional Debridement  . Skin graft Left     left leg within the past month    Family History  Problem Relation Age of Onset  . Diabetes Mother   . Diabetes Maternal Grandmother   . Diabetes Paternal Grandmother   . Cancer Neg Hx     no FH of Colon Cancer   Social History:  reports that he has been smoking Cigarettes.  He has a 20 pack-year smoking history. He has never used smokeless tobacco. He reports that he does not drink alcohol or use illicit drugs.  Allergies: No Known Allergies  No prescriptions prior to admission    No results found for  this or any previous visit (from the past 48 hour(s)). No results found.  Review of Systems  All other systems reviewed and are negative.    There were no vitals taken for this visit. Physical Exam  On examination patient has ulceration abscess osteomyelitis of the left foot. He is also status post traumatic wound of the left leg which appears to be healing well. Due to failure foot salvage surgery patient presents for transtibial amputation. Assessment/Plan Assessment: Ulceration osteomyelitis diabetic insensate neuropathy with Charcot collapse left foot.  Plan: Will plan for transtibial amputation. Risks and benefits were discussed including infection neurovascular injury persistent pain DVT pulmonary embolus need for additional surgery. Patient states he understands and wished to proceed at this time.  [name] 02/02/2013, 6:19  AM

## 2013-02-02 NOTE — Transfer of Care (Signed)
Immediate Anesthesia Transfer of Care Note  Patient: [name].  Procedure(s) Performed: Procedure(s) with comments: AMPUTATION BELOW KNEE (Left) - Left Below Knee Amputation  Patient Location: PACU  Anesthesia Type:General  Level of Consciousness: awake, alert  and oriented  Airway & Oxygen Therapy: Patient Spontanous Breathing and Patient connected to nasal cannula oxygen  Post-op Assessment: Report given to PACU RN, Post -op Vital signs reviewed and stable and Patient moving all extremities X 4  Post vital signs: Reviewed and stable  Complications: No apparent anesthesia complications

## 2013-02-02 NOTE — Preoperative (Signed)
Beta Blockers   Reason not to administer Beta Blockers:Not Applicable 

## 2013-02-02 NOTE — Anesthesia Postprocedure Evaluation (Signed)
  Anesthesia Post-op Note  Patient: [name].  Procedure(s) Performed: Procedure(s) with comments: AMPUTATION BELOW KNEE (Left) - Left Below Knee Amputation  Patient Location: PACU  Anesthesia Type:General  Level of Consciousness: awake  Airway and Oxygen Therapy: Patient Spontanous Breathing  Post-op Pain: mild  Post-op Assessment: Post-op Vital signs reviewed  Post-op Vital Signs: Reviewed  Complications: No apparent anesthesia complications

## 2013-02-02 NOTE — Op Note (Signed)
OPERATIVE REPORT  DATE OF SURGERY: 02/02/2013  PATIENT:  [name].,  62 y.o. male  PRE-OPERATIVE DIAGNOSIS:  Osteomyelitis and gangrene left foot  POST-OPERATIVE DIAGNOSIS:  Osteomyelitis and gangrene left foot  PROCEDURE:  Procedure(s): AMPUTATION BELOW KNEE  SURGEON:  Surgeon(s): Nadara Mustard, MD  ANESTHESIA:   general  EBL:  Minimal ML  SPECIMEN:  Source of Specimen:  Left leg  TOURNIQUET:   Total Tourniquet Time Documented: Thigh (Left) - 6 minutes Total: Thigh (Left) - 6 minutes   PROCEDURE DETAILS: Patient is a 62 year old gentleman with diabetes peripheral vascular disease status post foot salvage surgeries who presents at this time with osteomyelitis abscess ulceration to the left lower extremity he has failed conservative treatment and presents at this time for transtibial amputation. Risks and benefits were discussed including risk of nonhealing risk for higher level amputation. Patient states he understands and wished to proceed at this time. Description of procedure patient was brought to the operating room and underwent a general anesthetic. After adequate levels and anesthesia were obtained patient's of first right lower extremity dressing was changed and reapplied he had some ischemic changes from the fifth ray amputation sterile compressive dressing was applied. The left lower extremity was then prepped using DuraPrep draped into a sterile field and the foot was draped out of sterile field with an impervious stockinette. A transverse incision was made 11 cm distal the tibial tubercle this curved proximally and a large posterior flap was created. The tibia was transected just proximal to the skin incision the fibula was transected just proximal to the tibial incision. Amputation knife was used to create a large posterior flap. The vascular bundles were suture ligated with 2-0 silk. The sciatic nerve was pulled cut and allowed to retract. The tourniquet was deflated  hemostasis was obtained. Deep and superficial fascial layers were closed using #1 PDS. The skin was closed using staples and nylon. The wound is covered with Adaptic orthopedic sponges AB dressing Kerlix and Coban. Patient was extubated taken to the PACU in stable condition.  PLAN OF CARE: Admit to inpatient   PATIENT DISPOSITION:  PACU - hemodynamically stable.   Nadara Mustard, MD 02/02/2013 12:00 PM

## 2013-02-02 NOTE — Anesthesia Preprocedure Evaluation (Addendum)
Anesthesia Evaluation  Patient identified by MRN, date of birth, ID band Patient awake    Reviewed: Allergy & Precautions, H&P , NPO status , Patient's Chart, lab work & pertinent test results  Airway Mallampati: II      Dental  (+) Dental Advidsory Given   Pulmonary COPD         Cardiovascular hypertension, + CAD + dysrhythmias     Neuro/Psych  Headaches,  Neuromuscular disease    GI/Hepatic Neg liver ROS, GERD-  ,  Endo/Other  diabetes  Renal/GU Renal disease     Musculoskeletal   Abdominal   Peds  Hematology   Anesthesia Other Findings   Reproductive/Obstetrics                          Anesthesia Physical Anesthesia Plan  ASA: III  Anesthesia Plan: General   Post-op Pain Management:    Induction: Intravenous  Airway Management Planned: LMA  Additional Equipment:   Intra-op Plan:   Post-operative Plan: Extubation in OR  Informed Consent: I have reviewed the patients History and Physical, chart, labs and discussed the procedure including the risks, benefits and alternatives for the proposed anesthesia with the patient or authorized representative who has indicated his/her understanding and acceptance.   Dental advisory given and Dental Advisory Given  Plan Discussed with: CRNA and Anesthesiologist  Anesthesia Plan Comments:        Anesthesia Quick Evaluation

## 2013-02-03 LAB — RENAL FUNCTION PANEL
Albumin: 3 g/dL — ABNORMAL LOW (ref 3.5–5.2)
CO2: 26 mEq/L (ref 19–32)
Calcium: 9.3 mg/dL (ref 8.4–10.5)
Creatinine, Ser: 6.66 mg/dL — ABNORMAL HIGH (ref 0.50–1.35)
GFR calc Af Amer: 9 mL/min — ABNORMAL LOW (ref >90)
GFR calc non Af Amer: 8 mL/min — ABNORMAL LOW (ref >90)
Phosphorus: 6.6 mg/dL — ABNORMAL HIGH (ref 2.3–4.6)
Sodium: 138 mEq/L (ref 135–145)

## 2013-02-03 LAB — PROTIME-INR
INR: 1.72 — ABNORMAL HIGH (ref 0.00–1.49)
Prothrombin Time: 19.7 seconds — ABNORMAL HIGH (ref 11.6–15.2)

## 2013-02-03 LAB — CBC
MCH: 28.4 pg (ref 26.0–34.0)
MCV: 94.2 fL (ref 78.0–100.0)
Platelets: 380 10*3/uL (ref 150–400)
RBC: 2.92 MIL/uL — ABNORMAL LOW (ref 4.22–5.81)
RDW: 17.9 % — ABNORMAL HIGH (ref 11.5–15.5)

## 2013-02-03 MED ORDER — DOXERCALCIFEROL 4 MCG/2ML IV SOLN
INTRAVENOUS | Status: AC
  Administered 2013-02-03: 6 ug via INTRAVENOUS
  Filled 2013-02-03: qty 4

## 2013-02-03 MED ORDER — HYDROMORPHONE HCL PF 1 MG/ML IJ SOLN
INTRAMUSCULAR | Status: AC
  Administered 2013-02-03: 1 mg
  Filled 2013-02-03: qty 1

## 2013-02-03 MED ORDER — WARFARIN SODIUM 7.5 MG PO TABS
7.5000 mg | ORAL_TABLET | Freq: Once | ORAL | Status: AC
  Administered 2013-02-03: 7.5 mg via ORAL
  Filled 2013-02-03: qty 1

## 2013-02-03 MED ORDER — OXYCODONE HCL 5 MG PO TABS
10.0000 mg | ORAL_TABLET | ORAL | Status: DC | PRN
  Administered 2013-02-03 – 2013-02-04 (×2): 10 mg via ORAL
  Filled 2013-02-03 (×2): qty 2

## 2013-02-03 MED ORDER — DARBEPOETIN ALFA-POLYSORBATE 100 MCG/0.5ML IJ SOLN
INTRAMUSCULAR | Status: AC
  Administered 2013-02-03: 100 ug via INTRAVENOUS
  Filled 2013-02-03: qty 0.5

## 2013-02-03 MED ORDER — HYDROCODONE-ACETAMINOPHEN 5-325 MG PO TABS
ORAL_TABLET | ORAL | Status: AC
  Filled 2013-02-03: qty 2

## 2013-02-03 NOTE — Progress Notes (Signed)
OT Cancellation Note  Patient Details Name: [name]. MRN: [id number] DOB: September 23, 1950   Cancelled Treatment:    Reason Eval/Treat Not Completed: Patient at procedure or test/ unavailable (in HD). Will re-attempt later today as time allows.  02/03/2013 Cipriano Mile OTR/L Pager [phone] Office [phone]

## 2013-02-03 NOTE — Procedures (Signed)
Patient was seen on dialysis and the procedure was supervised.  BFR 400  Via AVF BP is  122/67.   Patient appears to be tolerating treatment well  Yamira Papa A 02/03/2013

## 2013-02-03 NOTE — Progress Notes (Signed)
PT Cancellation Note  Patient Details Name: [name]. MRN: [id number] DOB: Apr 06, 1951  .   Cancelled Treatment:    Reason Eval/Treat Not Completed: Fatigue/lethargy limiting ability to participate.  Patient just returned from HD.  Patient reports that MD told him "to rest over the weekend".  Will reattempt tomorrow.   Olivia Canter 02/03/2013, 2:21 PM

## 2013-02-03 NOTE — Progress Notes (Signed)
Patient ID: [name]., male   DOB: [birth year], 62 y.o.   MRN: [id number] Postoperative day 1 transtibial amputation. We'll change Percocet 2 oxycodone. Recommend pulse ox monitoring do to patient's narcotic use.

## 2013-02-03 NOTE — Progress Notes (Signed)
ANTICOAGULATION CONSULT NOTE - Follow Up Consult  Pharmacy Consult for warfarin Indication: atrial fibrillation  No Known Allergies  Patient Measurements: Height: 6' (182.9 cm) Weight: 232 lb 2.3 oz (105.3 kg) IBW/kg (Calculated) : 77.6   Vital Signs: Temp: 99.1 F (37.3 C) (08/23 1239) Temp src: Oral (08/23 1239) BP: 146/79 mmHg (08/23 1239) Pulse Rate: 96 (08/23 1239)  Labs:  Recent Labs  02/02/13 0900 02/03/13 0500 02/03/13 1300 02/03/13 1301  HGB 9.0*  --   --  8.3*  HCT 29.5*  --   --  27.5*  PLT 444*  --   --  380  APTT 51*  --   --   --   LABPROT 18.5* 19.7*  --   --   INR 1.59* 1.72*  --   --   CREATININE 4.97*  --  6.66*  --     Estimated Creatinine Clearance: 14.4 ml/min (by C-G formula based on Cr of 6.66).   Medications:  Scheduled:  . allopurinol  100 mg Oral Daily  . calcium acetate  667 mg Oral TID WC  . darbepoetin (ARANESP) injection - DIALYSIS  100 mcg Intravenous Q Sat-HD  . doxercalciferol  6 mcg Intravenous Q T,Th,Sa-HD  . ferric gluconate (FERRLECIT/NULECIT) IV  125 mg Intravenous Q Sat-HD  . gabapentin  300 mg Oral TID  . insulin aspart  0-15 Units Subcutaneous TID WC  . insulin aspart  3 Units Subcutaneous TID WC  . lanthanum  2,000 mg Oral TID WC  . pantoprazole  40 mg Oral Daily  . temazepam  30 mg Oral QHS  . Warfarin - Pharmacist Dosing Inpatient   Does not apply q1800    Assessment: 62 YOM on warfarin for chronic AFib. Is s/p left transtibial amputation yesterday. INR this morning was still low at 1.72. Patient's home dose of warfarin is 5mg  daily except 2.5mg  Tuesday and Thursday. CBC dropped slightly as expected after surgery. Plts still WNL, no bleeding noted.  Goal of Therapy:  INR 2-3 Monitor platelets by anticoagulation protocol: Yes   Plan:  1. Warfarin 7.5mg  po x1 tonight 2. Daily PT/INR  Rivky Clendenning D. Karlis Cregg, PharmD Clinical Pharmacist Pager: [phone] 02/03/2013 1:53 PM

## 2013-02-03 NOTE — Progress Notes (Signed)
Subjective:   Seen on HD- looks comfortable.  Still just having shooting pains, neurontin was increased Objective Vital signs in last 24 hours: Filed Vitals:   02/02/13 2120 02/03/13 0648 02/03/13 0817 02/03/13 0820  BP: 156/92 148/72 137/78 122/67  Pulse: 91 89 90 96  Temp: 97.6 F (36.4 C) 98.6 F (37 C) 98.5 F (36.9 C)   TempSrc: Oral Oral Oral   Resp: 18 18 18 16   Height:      Weight:   107.7 kg (237 lb 7 oz)   SpO2: 98% 98% 95%    Weight change:   Intake/Output Summary (Last 24 hours) at 02/03/13 2130 Last data filed at 02/02/13 1900  Gross per 24 hour  Intake    995 ml  Output    200 ml  Net    795 ml    Assessment/Plan: 62 year old WM with multiple medical issues including ESRD- he now is s/p BKA for chronic osteo  1 s/p BKA for chronic osteo-  POD #1- per Dr. [name]- pain control. Given ancef perioperatively.  2 ESRD: normally TTS at [facility] via AVF- no particular problems with HD, will continue with home regimen- profile #4.  3 Hypertension/volume: BP better. On no BP meds as OP- BP pretty well controlled as OP with volume only- will follow  4. Anemia of ESRD: hgb was low coming into this. Will continue weekly venofer and increase ESA dosing  5. Metabolic Bone Disease: continue home meds of hectorol/phoslo and fosrenol. Phos and PTH not well controlled as OP (8.3 and 492 repectively)  6. On chronic coumadin for A fib  7. Gout- continue OP allopurinol  8. DM- looks like only on SSI for now    [name] A    Labs: Basic Metabolic Panel:  Recent Labs Lab 02/02/13 0900  NA 140  K 3.8  CL 95*  CO2 30  GLUCOSE 207*  BUN 29*  CREATININE 4.97*  CALCIUM 10.2   Liver Function Tests:  Recent Labs Lab 02/02/13 0900  AST 11  ALT 8  ALKPHOS 82  BILITOT 0.4  PROT 7.6  ALBUMIN 3.2*   No results found for this basename: LIPASE, AMYLASE,  in the last 168 hours No results found for this basename: AMMONIA,  in the last 168 hours CBC:  Recent Labs Lab  02/02/13 0900  WBC 11.3*  HGB 9.0*  HCT 29.5*  MCV 93.1  PLT 444*   Cardiac Enzymes: No results found for this basename: CKTOTAL, CKMB, CKMBINDEX, TROPONINI,  in the last 168 hours CBG:  Recent Labs Lab 02/02/13 0855 02/02/13 1219 02/02/13 1625 02/02/13 2138 02/03/13 0636  GLUCAP 198* 202* 230* 214* 184*    Iron Studies: No results found for this basename: IRON, TIBC, TRANSFERRIN, FERRITIN,  in the last 72 hours Studies/Results: No results found. Medications: Infusions: . sodium chloride      Scheduled Medications: . allopurinol  100 mg Oral Daily  . calcium acetate  667 mg Oral TID WC  . darbepoetin (ARANESP) injection - DIALYSIS  100 mcg Intravenous Q Sat-HD  . doxercalciferol  6 mcg Intravenous Q T,Th,Sa-HD  . ferric gluconate (FERRLECIT/NULECIT) IV  125 mg Intravenous Q Sat-HD  . gabapentin  300 mg Oral TID  . insulin aspart  0-15 Units Subcutaneous TID WC  . insulin aspart  3 Units Subcutaneous TID WC  . lanthanum  2,000 mg Oral TID WC  . pantoprazole  40 mg Oral Daily  . temazepam  30 mg Oral QHS  .  Warfarin - Pharmacist Dosing Inpatient   Does not apply q1800    have reviewed scheduled and prn medications.  Physical Exam: General: alert NAD Heart: RRR Lungs: clear Abdomen: soft, non tender Extremities: no edema- s/p left BKA Dialysis Access: left aVF    02/03/2013,8:32 AM  LOS: 1 day

## 2013-02-04 LAB — GLUCOSE, CAPILLARY
Glucose-Capillary: 167 mg/dL — ABNORMAL HIGH (ref 70–99)
Glucose-Capillary: 245 mg/dL — ABNORMAL HIGH (ref 70–99)

## 2013-02-04 LAB — PROTIME-INR: INR: 2.42 — ABNORMAL HIGH (ref 0.00–1.49)

## 2013-02-04 MED ORDER — RENA-VITE PO TABS
1.0000 | ORAL_TABLET | Freq: Every day | ORAL | Status: DC
  Administered 2013-02-05: 1 via ORAL
  Filled 2013-02-04 (×3): qty 1

## 2013-02-04 MED ORDER — BOOST / RESOURCE BREEZE PO LIQD
1.0000 | Freq: Two times a day (BID) | ORAL | Status: DC
  Administered 2013-02-04 – 2013-02-05 (×4): 1 via ORAL

## 2013-02-04 MED ORDER — WARFARIN SODIUM 5 MG PO TABS
5.0000 mg | ORAL_TABLET | Freq: Once | ORAL | Status: AC
  Administered 2013-02-04: 5 mg via ORAL
  Filled 2013-02-04: qty 1

## 2013-02-04 NOTE — Progress Notes (Signed)
[facility] Progress Note  Subjective:    In good spirits. Phantom pain improving.  Has not worked with PT - says he "kicked them out of the room" but plans to apologize.  No emerging complaints. Low grade temp  Objective Filed Vitals:   02/03/13 1239 02/03/13 1400 02/03/13 2154 02/04/13 0551  BP: 146/79 144/75 101/53 111/63  Pulse: 96 88 93 89  Temp: 99.1 F (37.3 C) 97.1 F (36.2 C) 100.1 F (37.8 C) 99.1 F (37.3 C)  TempSrc: Oral  Oral Oral  Resp: 16 16 16 14   Height:      Weight: 105.3 kg (232 lb 2.3 oz)     SpO2: 95% 96% 100% 97%   Physical Exam General: Pleasant, cooperative, NAD Heart: RRR Lungs: Clear bilat, no wheezes, rales or rhonchi noted Abdomen: Soft, NT, non-distended, +BS Extremities: RLE with SCD and prior 5th toe amp site wrapped, L BKA wrapped Dialysis Access: L AVF +bruit  Outpatient Dialysis:  AF TTS  EDW 106.5. HD Bath 2K/2.25 calc, Dialyzer 180, Heparin doesn't get. Access left upper AVF.   Assessment/Plan: 1. s/p BKA for chronic osteo- on 8/22 per Dr. [name]- pain control. Given ancef perioperatively.  2. ESRD: Cont TTS. K+ 4.2 Next HD 8/26,  profile #4.  3. Hypertension/volume: BP better controlled with better pain mgmt. BP meds as OP but none here,  will follow  4. Anemia of ESRD: Hgb 8.3 post-op. Was low on admit. On Aranesp 100 here. Will continue weekly venofer and increase ESA dosing at d/c. Follow CBC, transfuse prn. 5. Metabolic Bone Disease: Continue home meds of hectorol/phoslo and fosrenol. Phos and PTH not well controlled as OP (8.3 and 492 repectively) 6. Nutrition - Alb 3, Change to renal diet given ^Phos. Add multivitamin and breeze 7. Afib - On chronic coumadin  8. Gout- continue OP allopurinol  9. DM- looks like only on SSI for now 10. Dispo - Plan for d/c to SNF for rehab eventually  Scot Jun. Broadus John, PA-C [facility] Pager [phone] 02/04/2013,8:51 AM  LOS: 2 days   Patient seen and examined,  agree with above note with above modifications.  Pt actually seems to be doing remarkably well considering- no issues with HD yest, next due on Tuesday Annie Sable, MD 02/04/2013      Additional Objective Labs: Basic Metabolic Panel:  Recent Labs Lab 02/02/13 0900 02/03/13 1300  NA 140 138  K 3.8 4.2  CL 95* 96  CO2 30 26  GLUCOSE 207* 216*  BUN 29* 38*  CREATININE 4.97* 6.66*  CALCIUM 10.2 9.3  PHOS  --  6.6*   Liver Function Tests:  Recent Labs Lab 02/02/13 0900 02/03/13 1300  AST 11  --   ALT 8  --   ALKPHOS 82  --   BILITOT 0.4  --   PROT 7.6  --   ALBUMIN 3.2* 3.0*   CBC:  Recent Labs Lab 02/02/13 0900 02/03/13 1301  WBC 11.3* 10.0  HGB 9.0* 8.3*  HCT 29.5* 27.5*  MCV 93.1 94.2  PLT 444* 380   Blood Culture    Component Value Date/Time   SDES WOUND RIGHT FOOT 01/18/2013 1626   SDES WOUND RIGHT FOOT 01/18/2013 1626   SPECREQUEST PATIENT ON FOLLOWING ZOSYN VANC 01/18/2013 1626   SPECREQUEST PATIENT ON FOLLOWING VANC ZOSYN 01/18/2013 1626   CULT  Value: MULTIPLE ORGANISMS PRESENT, NONE PREDOMINANT Note: NO STAPHYLOCOCCUS AUREUS ISOLATED NO GROUP A STREP (S.PYOGENES) ISOLATED Performed at [facility] 01/18/2013  1626   CULT  Value: NO ANAEROBES ISOLATED Performed at [facility] 01/18/2013 1626   REPTSTATUS 01/21/2013 FINAL 01/18/2013 1626   REPTSTATUS 01/23/2013 FINAL 01/18/2013 1626    Cardiac Enzymes: No results found for this basename: CKTOTAL, CKMB, CKMBINDEX, TROPONINI,  in the last 168 hours CBG:  Recent Labs Lab 02/02/13 2138 02/03/13 0636 02/03/13 1648 02/03/13 2213 02/04/13 0707  GLUCAP 214* 184* 200* 167* 140*   Studies/Results: No results found. Medications: . sodium chloride     . allopurinol  100 mg Oral Daily  . calcium acetate  667 mg Oral TID WC  . darbepoetin (ARANESP) injection - DIALYSIS  100 mcg Intravenous Q Sat-HD  . doxercalciferol  6 mcg Intravenous Q T,Th,Sa-HD  . ferric gluconate  (FERRLECIT/NULECIT) IV  125 mg Intravenous Q Sat-HD  . gabapentin  300 mg Oral TID  . insulin aspart  0-15 Units Subcutaneous TID WC  . insulin aspart  3 Units Subcutaneous TID WC  . lanthanum  2,000 mg Oral TID WC  . pantoprazole  40 mg Oral Daily  . temazepam  30 mg Oral QHS  . warfarin  5 mg Oral ONCE-1800  . Warfarin - Pharmacist Dosing Inpatient   Does not apply [phone]

## 2013-02-04 NOTE — Progress Notes (Signed)
Clinical Social Work Department BRIEF PSYCHOSOCIAL ASSESSMENT 02/04/2013  Patient:  [name], [name]     Account Number:  [id number]     Admit date:  02/02/2013  Clinical Social Worker:  Jacelyn Grip  Date/Time:  02/04/2013 11:04 AM  Referred by:  Physician  Date Referred:  02/04/2013 Referred for  SNF Placement   Other Referral:   Interview type:  Patient Other interview type:    PSYCHOSOCIAL DATA Living Status:  WIFE Admitted from facility:   Level of care:   Primary support name:  [place]ita Quin Wasser/wife Primary support relationship to patient:  SPOUSE Degree of support available:   adequate    CURRENT CONCERNS Current Concerns  Post-Acute Placement   Other Concerns:    SOCIAL WORK ASSESSMENT / PLAN CSW received referral for New SNF placement.    CSW reviewed chart and noted that PT has not yet evaluated pt, but MD notes states that plan is for [facility].    CSW met with pt at bedside to discuss.    CSW introduced self and explained role. Pt discussed that he lives with his wife, but his wife works 6 days a week and pt does not want to return home until he is stronger as pt is s/p Left BKA.    Pt is agreeable to SNF search. Pt wants to initiate SNF search only to [facility], [facility], and [place] [place] at this time. Pt discussed that he had dialysis at [facility] dialysis center and he does not want to change his dialysis center and would like a SNF that can transport him to dialysis.    CSW provided SNF list in [facility] for pt to review and consider other options if needed.    CSW completed FL2 and initiated SNF search to [id number], [facility] [place], and [facility].    Weekday CSW to follow up with pt re: bed offers.    CSW to continue to follow and facilitate pt discharge needs when pt medically stable for discharge   Assessment/plan status:  Psychosocial Support/Ongoing Assessment of Needs Other assessment/ plan:   discharge planning    Information/referral to community resources:   [facility] list    PATIENT'S/FAMILY'S RESPONSE TO PLAN OF CARE: Pt alert and oriented x 4. Pt appears to be coping well s/p left BKA. Pt recognizes that he will need to get stronger at rehab before returning home as his wife works 6 days a week and pt does not have assitance at home at this time.     Jacklynn Lewis, MSW, [employer]  Clinical Social Work Weekend coverage [phone]

## 2013-02-04 NOTE — Progress Notes (Signed)
Rehab Admissions Coordinator Note:  Patient was screened by [name] for appropriateness for an Inpatient Acute Rehab Consult.  At this time, we are recommending Inpatient Rehab consult.  [name] 02/04/2013, 6:45 PM  I can be reached at [phone].

## 2013-02-04 NOTE — Progress Notes (Signed)
ANTICOAGULATION CONSULT NOTE - Follow Up Consult  Pharmacy Consult for warfarin Indication: atrial fibrillation  No Known Allergies  Patient Measurements: Height: 6' (182.9 cm) Weight: 232 lb 2.3 oz (105.3 kg) IBW/kg (Calculated) : 77.6   Vital Signs: Temp: 99.1 F (37.3 C) (08/24 0551) Temp src: Oral (08/24 0551) BP: 111/63 mmHg (08/24 0551) Pulse Rate: 89 (08/24 0551)  Labs:  Recent Labs  02/02/13 0900 02/03/13 0500 02/03/13 1300 02/03/13 1301 02/04/13 0428  HGB 9.0*  --   --  8.3*  --   HCT 29.5*  --   --  27.5*  --   PLT 444*  --   --  380  --   APTT 51*  --   --   --   --   LABPROT 18.5* 19.7*  --   --  25.5*  INR 1.59* 1.72*  --   --  2.42*  CREATININE 4.97*  --  6.66*  --   --     Estimated Creatinine Clearance: 14.4 ml/min (by C-G formula based on Cr of 6.66).   Medications:  Scheduled:  . allopurinol  100 mg Oral Daily  . calcium acetate  667 mg Oral TID WC  . darbepoetin (ARANESP) injection - DIALYSIS  100 mcg Intravenous Q Sat-HD  . doxercalciferol  6 mcg Intravenous Q T,Th,Sa-HD  . ferric gluconate (FERRLECIT/NULECIT) IV  125 mg Intravenous Q Sat-HD  . gabapentin  300 mg Oral TID  . insulin aspart  0-15 Units Subcutaneous TID WC  . insulin aspart  3 Units Subcutaneous TID WC  . lanthanum  2,000 mg Oral TID WC  . pantoprazole  40 mg Oral Daily  . temazepam  30 mg Oral QHS  . Warfarin - Pharmacist Dosing Inpatient   Does not apply q1800    Assessment: 62 YOM on warfarin for chronic AFib. Is s/p left transtibial amputation 8/22. Patient's home dose of warfarin is 5mg  daily except 2.5mg  Tuesday and Thursday. CBC dropped slightly as expected after surgery. H/H 8.3/27.5, Plts still WNL, no bleeding noted.  INR 8/24 2.42 Based on current trend, will decrease dose of coumadin tonight  Goal of Therapy:  INR 2-3 Monitor platelets by anticoagulation protocol: Yes   Plan:  - Coumadin 5mg  po x 1 tonight - Daily PT/INR  Chantae Soo B. Artelia Laroche,  PharmD Clinical Pharmacist - Resident Pager: [phone] Phone: [phone] 02/04/2013 8:18 AM

## 2013-02-04 NOTE — Evaluation (Signed)
Occupational Therapy Evaluation Patient Details Name: [name]. MRN: [id number] DOB: 10-Jan-1951 Today's Date: 02/04/2013 Time: 4098-1191 OT Time Calculation (min): 31 min  OT Assessment / Plan / Recommendation History of present illness Pt s/p L BKA. PMhx significant fotr HTN, hyperlipidemia, a fib, CAD, COPD as well as ESRD- on HD at [facility] on TTS via AVF. Recently s/p Rt 5th metatarsal ampuation on 8/07.   Clinical Impression   Pt admitted with above. Will continue to follow pt acutely in order to address below problem list. Recommend CIR to further progress rehab and maximize independence and safety with ADLs prior to return home.    OT Assessment  Patient needs continued OT Services    Follow Up Recommendations  CIR    Barriers to Discharge      Equipment Recommendations  3 in 1 bedside comode    Recommendations for Other Services Rehab consult  Frequency  Min 2X/week    Precautions / Restrictions Precautions Precautions: Fall Precaution Comments: pt has history of falls  Required Braces or Orthoses: Other Brace/Splint Other Brace/Splint: Darco shoe Rt LE   Pertinent Vitals/Pain See vitals    ADL  Eating/Feeding: Performed;Independent Where Assessed - Eating/Feeding: Bed level Upper Body Dressing: Performed;Set up Where Assessed - Upper Body Dressing: Unsupported sitting Lower Body Dressing: Performed;+1 Total assistance Where Assessed - Lower Body Dressing: Unsupported sitting Toilet Transfer: Simulated;+2 Total assistance Toilet Transfer: Patient Percentage: 60% Toilet Transfer Method: Stand pivot Toilet Transfer Equipment:  (bed<>chair) Transfers/Ambulation Related to ADLs: +2 total assist pt=60% for SPT from bed<>chair with RW. ADL Comments: Educated pt on desensitization techniques for LLE as precursor for eventual prosthesis.      OT Diagnosis: Generalized weakness;Acute pain  OT Problem List: Decreased strength;Decreased activity tolerance;Impaired  balance (sitting and/or standing);Decreased knowledge of use of DME or AE;Decreased knowledge of precautions;Pain OT Treatment Interventions: Self-care/ADL training;Therapeutic exercise;DME and/or AE instruction;Therapeutic activities;Patient/family education;Balance training   OT Goals(Current goals can be found in the care plan section) Acute Rehab OT Goals Patient Stated Goal: to return to being independent OT Goal Formulation: With patient Time For Goal Achievement: 02/18/13 Potential to Achieve Goals: Good  Visit Information  Last OT Received On: 02/04/13 Assistance Needed: +2 PT/OT Co-Evaluation/Treatment: Yes History of Present Illness: Pt s/p L BKA. PMhx significant fotr HTN, hyperlipidemia, a fib, CAD, COPD as well as ESRD- on HD at [facility] on TTS via AVF. Recently s/p Rt 5th metatarsal ampuation on 8/07.       Prior Functioning     Home Living Family/patient expects to be discharged to:: Private residence Living Arrangements: Spouse/significant other Available Help at Discharge: Family;Available PRN/intermittently Type of Home: House Home Access: Stairs to enter [employer] of Steps: 6-7 Entrance Stairs-Rails: Right Home Layout: One level Home Equipment: Walker - 2 wheels;Cane - single point;Crutches;Shower seat Prior Function Level of Independence: Independent with assistive device(s) Comments: uses SPC Communication Communication: No difficulties         Vision/Perception     Cognition  Cognition Arousal/Alertness: Awake/alert Behavior During Therapy: WFL for tasks assessed/performed Overall Cognitive Status: Within Functional Limits for tasks assessed    Extremity/Trunk Assessment Upper Extremity Assessment Upper Extremity Assessment: Generalized weakness     Mobility Bed Mobility Bed Mobility: Supine to Sit;Sitting - Scoot to Edge of Bed Supine to Sit: 4: Min guard Sitting - Scoot to [place] of Bed: 4: Min guard Details for Bed Mobility  Assistance: Pt using momentum to elevate trunk off bed and come forward toward EOB.  No physical assist needed. Guarding for safety. Transfers Transfers: Sit to Stand;Stand to Sit Sit to Stand: 1: +2 Total assist;With upper extremity assist;From bed Sit to Stand: Patient Percentage: 60% Stand to Sit: 3: Mod assist;To chair/3-in-1;With upper extremity assist;With armrests Details for Transfer Assistance: Assist for power up and for balance due to initial posterior lean. VCs for hand placement and sequencing.     Exercise     Balance     End of Session OT - End of Session Equipment Utilized During Treatment: Gait belt;Rolling walker Activity Tolerance: Patient tolerated treatment well Patient left: in chair;with call bell/phone within reach;with family/visitor present Nurse Communication: Mobility status  GO   02/04/2013 Cipriano Mile OTR/L Pager [phone] Office [phone]   Cipriano Mile 02/04/2013, 3:41 PM

## 2013-02-04 NOTE — Progress Notes (Addendum)
Clinical Social Work Department CLINICAL SOCIAL WORK PLACEMENT NOTE 02/04/2013  Patient:  [name], [name]  Account Number:  [id number] Admit date:  02/02/2013  Clinical Social Worker:  Jacelyn Grip  Date/time:  02/04/2013 11:15 AM  Clinical Social Work is seeking post-discharge placement for this patient at the following level of care:   SKILLED NURSING   (*CSW will update this form in Epic as items are completed)   02/04/2013  Patient/family provided with [facility] Department of Clinical Social Work's list of facilities offering this level of care within the geographic area requested by the patient (or if unable, by the patient's family).  02/04/2013  Patient/family informed of their freedom to choose among providers that offer the needed level of care, that participate in Medicare, Medicaid or managed care program needed by the patient, have an available bed and are willing to accept the patient.  02/04/2013  Patient/family informed of [facility]' ownership interest in [facility], as well as of the fact that they are under no obligation to receive care at this facility.  PASARR submitted to EDS on 02/04/2013 PASARR number received from EDS on 02/04/2013  FL2 transmitted to all facilities in geographic area requested by pt/family on  02/04/2013 FL2 transmitted to all facilities within larger geographic area on   Patient informed that his/her managed care company has contracts with or will negotiate with  certain facilities, including the following:     Patient/family informed of bed offers received:  02/05/13 Patient chooses bed at [facility] Physician recommends and patient chooses bed at    Patient to be transferred to [facility] on  02/05/13 Patient to be transferred to facility by ptar  The following physician request were entered in Epic:   Additional Comments: Pt wanted to begin search with Lyman Speller, [facility] [place], and Dwana Curd, MSW, LCSWA  Clinical Social Work Weekend coverage [phone]

## 2013-02-04 NOTE — Progress Notes (Signed)
Patient ID: [name]., male   DOB: 1951-01-23, 62 y.o.   MRN: [id number] Patient feels better this morning. States the phantom pain is improving. He has full active extension of his knee. Plan for discharge to skilled nursing. Dialysis yesterday.

## 2013-02-04 NOTE — Evaluation (Signed)
Physical Therapy Evaluation Patient Details Name: [name]. MRN: [id number] DOB: 1950-07-04 Today's Date: 02/04/2013 Time: 4098-1191 PT Time Calculation (min): 31 min  PT Assessment / Plan / Recommendation History of Present Illness  Pt s/p L BKA. PMhx significant fotr HTN, hyperlipidemia, a fib, CAD, COPD as well as ESRD- on HD at [facility] on TTS via AVF. Recently s/p Rt 5th metatarsal ampuation on 8/07.  Clinical Impression  Patient is s/p LBKA surgery resulting in functional limitations due to the deficits listed below (see PT Problem List).   Patient will benefit from skilled PT to increase their independence and safety with mobility to allow discharge to the venue listed below.        PT Assessment  Patient needs continued PT services    Follow Up Recommendations  CIR    Does the patient have the potential to tolerate intense rehabilitation      Barriers to Discharge Decreased caregiver support (wife works)      Engineer, agricultural with 5" wheels;3in1 (PT);Wheelchair (measurements PT)    Recommendations for Other Services Rehab consult   Frequency Min 4X/week    Precautions / Restrictions Precautions Precautions: Fall Precaution Comments: pt has history of falls  Required Braces or Orthoses: Other Brace/Splint Other Brace/Splint: Darco shoe Rt LE   Pertinent Vitals/Pain Minimal pain on eval patient repositioned for comfort       Mobility  Bed Mobility Bed Mobility: Supine to Sit;Sitting - Scoot to Edge of Bed Supine to Sit: 4: Min guard Sitting - Scoot to [place] of Bed: 4: Min guard Details for Bed Mobility Assistance: Pt using momentum to elevate trunk off bed and come forward toward EOB. No physical assist needed. Guarding for safety. Transfers Transfers: Sit to Stand;Stand to Sit Sit to Stand: 1: +2 Total assist;With upper extremity assist;From bed Sit to Stand: Patient Percentage: 60% Stand to Sit: 3: Mod assist;To  chair/3-in-1;With upper extremity assist;With armrests Details for Transfer Assistance: Assist for power up and for balance due to initial posterior lean. VCs for hand placement and sequencing. Ambulation/Gait Ambulation/Gait Assistance: 4: Min assist Ambulation Distance (Feet): 2 Feet (pivot steps bed to chair) Assistive device: Rolling walker Ambulation/Gait Assistance Details: Pt initially wanting to try crutches, however agreed they were less steady with initial try; Used RW for UE support with small steps to chair; cues and physical assist to kee RW close    Exercises Amputee Exercises Quad Sets: AROM;Left;10 reps Hip Extension: AROM;Left;5 reps;Standing Knee Flexion: AROM;Left;5 reps Knee Extension: AROM;Left;10 reps   PT Diagnosis: Difficulty walking;Acute pain  PT Problem List: Decreased strength;Decreased range of motion;Decreased activity tolerance;Decreased balance;Decreased mobility;Decreased knowledge of use of DME;Decreased knowledge of precautions;Pain PT Treatment Interventions: DME instruction;Gait training;Functional mobility training;Therapeutic activities;Therapeutic exercise;Patient/family education;Wheelchair mobility training     PT Goals(Current goals can be found in the care plan section) Acute Rehab PT Goals Patient Stated Goal: to return to being independent PT Goal Formulation: With patient Time For Goal Achievement: 02/18/13 Potential to Achieve Goals: Good Additional Goals Additional Goal #1: Pt will perform wheelchair propulsion and management with supervision  Visit Information  Last PT Received On: 02/04/13 Assistance Needed: +2 History of Present Illness: Pt s/p L BKA. PMhx significant fotr HTN, hyperlipidemia, a fib, CAD, COPD as well as ESRD- on HD at [facility] on TTS via AVF. Recently s/p Rt 5th metatarsal ampuation on 8/07.       Prior Functioning  Home Living Family/patient expects to be discharged to:: Private residence  Living Arrangements:  Spouse/significant other Available Help at Discharge: Family;Available PRN/intermittently Type of Home: House Home Access: Stairs to enter [employer] of Steps: 6-7 Entrance Stairs-Rails: Right Home Layout: One level Home Equipment: Walker - 2 wheels;Cane - single point;Crutches;Shower seat Prior Function Level of Independence: Independent with assistive device(s) Comments: uses SPC Communication Communication: No difficulties    Cognition  Cognition Arousal/Alertness: Awake/alert Behavior During Therapy: WFL for tasks assessed/performed Overall Cognitive Status: Within Functional Limits for tasks assessed    Extremity/Trunk Assessment Upper Extremity Assessment Upper Extremity Assessment: Generalized weakness Lower Extremity Assessment Lower Extremity Assessment: RLE deficits/detail;LLE deficits/detail RLE Deficits / Details: dressing still in place from R foot surgery LLE Deficits / Details: BKA; very good knee extension range; goo quad set; noted some hamstring tightness   Balance    End of Session PT - End of Session Equipment Utilized During Treatment: Gait belt Activity Tolerance: Patient tolerated treatment well Patient left: in chair;with call bell/phone within reach Nurse Communication: Mobility status  GP     Olen Pel [place], [place] [phone]  02/04/2013, 5:55 PM

## 2013-02-05 LAB — GLUCOSE, CAPILLARY: Glucose-Capillary: 171 mg/dL — ABNORMAL HIGH (ref 70–99)

## 2013-02-05 LAB — PROTIME-INR
INR: 2.85 — ABNORMAL HIGH (ref 0.00–1.49)
Prothrombin Time: 28.9 seconds — ABNORMAL HIGH (ref 11.6–15.2)

## 2013-02-05 MED ORDER — WARFARIN SODIUM 2.5 MG PO TABS
2.5000 mg | ORAL_TABLET | Freq: Once | ORAL | Status: DC
  Filled 2013-02-05: qty 1

## 2013-02-05 MED ORDER — OXYCODONE HCL 10 MG PO TABS: 10.0000 mg | ORAL_TABLET | ORAL | Status: DC | PRN

## 2013-02-05 NOTE — Discharge Summary (Signed)
Physician Discharge Summary  Patient ID: [name]. MRN: [id number] DOB/AGE: [birth year] 62 y.o.  Admit date: 02/02/2013 Discharge date: 02/05/2013  Admission Diagnoses: Osteomyelitis ulceration gangrene left foot  Discharge Diagnoses: Osteomyelitis ulceration gangrene left foot Active Problems:   * No active hospital problems. *   Discharged Condition: stable  Hospital Course: Patient's hospital course was essentially unremarkable. He underwent a transtibial amputation of the left. Postoperatively patient progressed slowly and was felt to require discharge to skilled nursing.  Consults: None  Significant Diagnostic Studies: labs: Routine labs  Treatments: dialysis: Hemodialysis and surgery: See operative note  Discharge Exam: Blood pressure 126/56, pulse 92, temperature 98.8 F (37.1 C), temperature source Oral, resp. rate 18, height 6' (1.829 m), weight 105.3 kg (232 lb 2.3 oz), SpO2 97.00%. Incision/Wound: dressing clean dry and intact  Disposition: 01-Home or Self Care  Discharge Orders   Future Appointments Provider Department Dept Phone   02/16/2013 11:15 AM Romero Belling, MD [facility] PRIMARY CARE ENDOCRINOLOGY [phone]   Future Orders Complete By Expires   Call MD / Call 911  As directed    Comments:     If you experience chest pain or shortness of breath, CALL 911 and be transported to the hospital emergency room.  If you develope a fever above 101 F, pus (white drainage) or increased drainage or redness at the wound, or calf pain, call your surgeon's office.   Constipation Prevention  As directed    Comments:     Drink plenty of fluids.  Prune juice may be helpful.  You may use a stool softener, such as Colace (over the counter) 100 mg twice a day.  Use MiraLax (over the counter) for constipation as needed.   Diet - low sodium heart healthy  As directed    Increase activity slowly as tolerated  As directed        Medication List         allopurinol 100 MG  tablet  Commonly known as:  ZYLOPRIM  Take 1 tablet by mouth daily.     COUMADIN 5 MG tablet  Generic drug:  warfarin  Take 2.5-5 mg by mouth every evening. Tues & Thurs take 2.5mg  & 5mg  on all other days     DIALYVITE PO  Take 100 mg by mouth daily.     doxycycline 50 MG capsule  Commonly known as:  VIBRAMYCIN  Take 2 capsules (100 mg total) by mouth 2 (two) times daily.     ethyl chloride spray  Apply 1 application topically daily as needed. For pain     FOSRENOL 1000 MG chewable tablet  Generic drug:  lanthanum  Chew 2 tablets by mouth 3 (three) times daily with meals.     gabapentin 300 MG capsule  Commonly known as:  NEURONTIN  Take 1 capsule (300 mg total) by mouth at bedtime.     HYDROcodone-acetaminophen 5-325 MG per tablet  Commonly known as:  NORCO/VICODIN  Take 0.5-1 tablets by mouth every 4 (four) hours as needed for pain. For pain     HYDROcodone-acetaminophen 5-325 MG per tablet  Commonly known as:  NORCO  Take 1 tablet by mouth every 6 (six) hours as needed for pain.     insulin aspart 100 UNIT/ML injection  Commonly known as:  NOVOLOG FLEXPEN  Inject 30 Units into the skin 3 (three) times daily before meals.     Insulin Syringe-Needle U-100 31G X 5/16" 0.5 ML Misc  Commonly known as:  BD  INSULIN SYRINGE ULTRAFINE  1 each by Other route 3 (three) times daily.     loperamide 2 MG capsule  Commonly known as:  IMODIUM  Take 2 mg by mouth 4 (four) times daily as needed for diarrhea or loose stools.     omeprazole 20 MG capsule  Commonly known as:  PRILOSEC  Take 20 mg by mouth daily.     Oxycodone HCl 10 MG Tabs  Take 1 tablet (10 mg total) by mouth every 4 (four) hours as needed for pain.     silver sulfADIAZINE 1 % cream  Commonly known as:  SILVADENE  Apply topically daily.     temazepam 30 MG capsule  Commonly known as:  RESTORIL  Take 30 mg by mouth at bedtime.           Follow-up Information   Follow up with DUDA,MARCUS V, MD In 2  weeks.   Specialty:  Orthopedic Surgery   Contact information:   [address] [place] [address] [phone]       Signed: Nadara Mustard 02/05/2013, 6:40 AM

## 2013-02-05 NOTE — Progress Notes (Signed)
Patient ID: Perry Drum., male   DOB: 20-Jan-1951, 62 y.o.   MRN: [id number] Patient refused renal diet. Carb modified diet started. Change dressing right leg today. Orders completed for discharge to skilled nursing.

## 2013-02-05 NOTE — Progress Notes (Signed)
Covering Clinical Child psychotherapist (CSW) informed pt that 2 of 3 preferred facilities are not able to offer placement for pt. CSW awaiting to hear back from [facility]. Pt agreeable to expand the SNF search to other facilities in [facility]. Pt aware of dc for today.   Theresia Bough, MSW, Theresia Majors [phone]

## 2013-02-05 NOTE — Progress Notes (Signed)
ANTICOAGULATION CONSULT NOTE - Follow Up Consult  Pharmacy Consult for warfarin Indication: atrial fibrillation  No Known Allergies  Patient Measurements: Height: 6' (182.9 cm) Weight: 232 lb 2.3 oz (105.3 kg) IBW/kg (Calculated) : 77.6   Vital Signs: Temp: 98.8 F (37.1 C) (08/25 0505) Temp src: Oral (08/25 0505) BP: 126/56 mmHg (08/25 0505) Pulse Rate: 92 (08/25 0505)  Labs:  Recent Labs  02/03/13 0500 02/03/13 1300 02/03/13 1301 02/04/13 0428 02/05/13 0610  HGB  --   --  8.3*  --   --   HCT  --   --  27.5*  --   --   PLT  --   --  380  --   --   LABPROT 19.7*  --   --  25.5* 28.9*  INR 1.72*  --   --  2.42* 2.85*  CREATININE  --  6.66*  --   --   --     Estimated Creatinine Clearance: 14.4 ml/min (by C-G formula based on Cr of 6.66).   Medications:  Scheduled:  . allopurinol  100 mg Oral Daily  . calcium acetate  667 mg Oral TID WC  . darbepoetin (ARANESP) injection - DIALYSIS  100 mcg Intravenous Q Sat-HD  . doxercalciferol  6 mcg Intravenous Q T,Th,Sa-HD  . feeding supplement  1 Container Oral BID BM  . ferric gluconate (FERRLECIT/NULECIT) IV  125 mg Intravenous Q Sat-HD  . gabapentin  300 mg Oral TID  . insulin aspart  0-15 Units Subcutaneous TID WC  . insulin aspart  3 Units Subcutaneous TID WC  . lanthanum  2,000 mg Oral TID WC  . multivitamin  1 tablet Oral QHS  . pantoprazole  40 mg Oral Daily  . temazepam  30 mg Oral QHS  . Warfarin - Pharmacist Dosing Inpatient   Does not apply q1800    Assessment: 62 YOM on warfarin for chronic AFib. Is s/p left transtibial amputation 8/22. Patient's home dose of warfarin is 5mg  daily except 2.5mg  Tuesday and Thursday. INR 2.85, no bleeding noted. Likely d/c to SNF today  Goal of Therapy:  INR 2-3 Monitor platelets by anticoagulation protocol: Yes   Plan:  - Coumadin 2.5mg  po x 1 tonight - OK to continue home dose after discharge. - Daily PT/INR  Bayard Hugger, PharmD, BCPS  Clinical Pharmacist  Pager:  [phone]  02/05/2013 2:55 PM

## 2013-02-05 NOTE — Progress Notes (Signed)
Inpatient Diabetes Program Recommendations  AACE/ADA: New Consensus Statement on Inpatient Glycemic Control (2013)  Target Ranges:  Prepandial:   less than 140 mg/dL      Peak postprandial:   less than 180 mg/dL (1-2 hours)      Critically ill patients:  140 - 180 mg/dL    Results for [name], Perry (MRN [id number]) as of 02/05/2013 11:55  Ref. Range 02/04/2013 07:07 02/04/2013 10:50 02/04/2013 16:38 02/04/2013 22:05 02/05/2013 06:50 02/05/2013 11:22  Glucose-Capillary Latest Range: 70-99 mg/dL 657 (H) 846 (H) 962 (H) 186 (H) 212 (H) 171 (H)   Inpatient Diabetes Program Recommendations Correction (SSI): If not discharged today as planned, please consider ordering Novolog bedtime correction. Insulin - Meal Coverage: Please consider increasing Novolog meal coverage to 4 units TID with meals.  Will continue to follow while inpatient.    Thanks, Orlando Penner, RN, MSN, CCRN Diabetes Coordinator Inpatient Diabetes Program [phone]

## 2013-02-05 NOTE — Progress Notes (Signed)
Clinical Child psychotherapist (CSW) informed by Eber Jones at [facility] that they are able to offer pt placement today and has received insurance authorization for pt. CSW notified pt who remains agreeable of dc plans. CSW prepared and placed pt dc packet in shadow chart, and contacted [facility] for a 14:30 pick up. RN made aware. CSW signing off.  Theresia Bough, MSW, LCSW [phone]

## 2013-02-05 NOTE — Progress Notes (Signed)
UR COMPLETED  

## 2013-02-05 NOTE — Progress Notes (Signed)
Physical Therapy Treatment Patient Details Name: [name]. MRN: [id number] DOB: [birth year] Today's Date: 02/05/2013 Time: 7829-5621 PT Time Calculation (min): 41 min  PT Assessment / Plan / Recommendation  History of Present Illness Pt s/p L BKA. PMhx significant fotr HTN, hyperlipidemia, a fib, CAD, COPD as well as ESRD- on HD at [facility] on TTS via AVF. Recently s/p Rt 5th metatarsal ampuation on 8/07.   PT Comments   Tolerating therex well, and very motivated; We discussed what to expect from healing and prosthetic training; Should do very well at post-acute rehab  Follow Up Recommendations  CIR     Does the patient have the potential to tolerate intense rehabilitation     Barriers to Discharge        Equipment Recommendations  Rolling walker with 5" wheels;3in1 (PT);Wheelchair (measurements PT)    Recommendations for Other Services Rehab consult  Frequency Min 4X/week   Progress towards PT Goals Progress towards PT goals: Progressing toward goals  Plan Current plan remains appropriate    Precautions / Restrictions Precautions Precautions: Fall Precaution Comments: pt has history of falls  Required Braces or Orthoses: Other Brace/Splint Other Brace/Splint: Darco shoe Rt LE Restrictions Other Position/Activity Restrictions: Pt can put weight through Rt Heel per MD on phone; Pt is full WB on Lt LE    Pertinent Vitals/Pain Occasional stabbing pain residual limb; subsides relatively quickly patient repositioned for comfort     Mobility  Bed Mobility Bed Mobility: Supine to Sit;Sitting - Scoot to [place] of Bed;Sit to Supine Supine to Sit: 5: Supervision Sitting - Scoot to Edge of Bed: 5: Supervision Sit to Supine: 5: Supervision Details for Bed Mobility Assistance: Pt using momentum to elevate trunk off bed and come forward toward EOB. No physical assist needed. Guarding for safety. Transfers Transfers: Sit to Stand;Stand to Sit Sit to Stand: 3: Mod assist;From  bed Stand to Sit: 3: Mod assist;To chair/3-in-1;With upper extremity assist;With armrests Details for Transfer Assistance: Assist for power up and for balance due to initial posterior lean. VCs for hand placement and sequencing. Cues to keep weight on heel Ambulation/Gait Ambulation/Gait Assistance: Not tested (comment)    Exercises Amputee Exercises Quad Sets: AROM;Left;10 reps Hip Extension: AROM;Left;10 reps;Standing Knee Flexion: AROM;Left;10 reps Knee Extension: AROM;Left;10 reps Other Exercises Other Exercises: Standing hip extension X10 Other Exercises: Standing hip abductionX10 Other Exercises: Isometric hip abduction x10; belt around thighs lying in bed Other Exercises: Hamstring stretches 6 bouts of 10 seconds Other Exercises: modified bridging with thighs bolstered x10   PT Diagnosis:    PT Problem List:   PT Treatment Interventions:     PT Goals (current goals can now be found in the care plan section) Acute Rehab PT Goals Patient Stated Goal: to return to being independent PT Goal Formulation: With patient Time For Goal Achievement: 02/18/13 Potential to Achieve Goals: Good  Visit Information  Last PT Received On: 02/05/13 Assistance Needed: +2 (+1 for therex, +2 for incr amb) History of Present Illness: Pt s/p L BKA. PMhx significant fotr HTN, hyperlipidemia, a fib, CAD, COPD as well as ESRD- on HD at [facility] on TTS via AVF. Recently s/p Rt 5th metatarsal ampuation on 8/07.    Subjective Data  Subjective: Really willing to work towards goal of walking normally with prosthesis Patient Stated Goal: to return to being independent   Cognition  Cognition Arousal/Alertness: Awake/alert Behavior During Therapy: WFL for tasks assessed/performed Overall Cognitive Status: Within Functional Limits for tasks assessed  Balance     End of Session PT - End of Session Equipment Utilized During Treatment: Gait belt Activity Tolerance: Patient tolerated treatment  well Patient left: in bed;with call bell/phone within reach;with family/visitor present Nurse Communication: Mobility status   GP     Van Clines [facility] [place], [place] [phone]  02/05/2013, 4:24 PM

## 2013-02-06 ENCOUNTER — Encounter (HOSPITAL_COMMUNITY): Payer: Self-pay | Admitting: Orthopedic Surgery

## 2013-02-06 ENCOUNTER — Non-Acute Institutional Stay (SKILLED_NURSING_FACILITY): Payer: 59 | Admitting: Adult Health

## 2013-02-06 DIAGNOSIS — K219 Gastro-esophageal reflux disease without esophagitis: Secondary | ICD-10-CM

## 2013-02-06 DIAGNOSIS — M109 Gout, unspecified: Secondary | ICD-10-CM

## 2013-02-06 DIAGNOSIS — N186 End stage renal disease: Secondary | ICD-10-CM

## 2013-02-06 DIAGNOSIS — E1059 Type 1 diabetes mellitus with other circulatory complications: Secondary | ICD-10-CM

## 2013-02-06 DIAGNOSIS — M869 Osteomyelitis, unspecified: Secondary | ICD-10-CM

## 2013-02-06 DIAGNOSIS — I4891 Unspecified atrial fibrillation: Secondary | ICD-10-CM

## 2013-02-06 DIAGNOSIS — I10 Essential (primary) hypertension: Secondary | ICD-10-CM

## 2013-02-06 DIAGNOSIS — G609 Hereditary and idiopathic neuropathy, unspecified: Secondary | ICD-10-CM

## 2013-02-06 DIAGNOSIS — G4700 Insomnia, unspecified: Secondary | ICD-10-CM

## 2013-02-07 ENCOUNTER — Non-Acute Institutional Stay (SKILLED_NURSING_FACILITY): Payer: 59 | Admitting: Internal Medicine

## 2013-02-07 DIAGNOSIS — I96 Gangrene, not elsewhere classified: Secondary | ICD-10-CM

## 2013-02-07 DIAGNOSIS — E1059 Type 1 diabetes mellitus with other circulatory complications: Secondary | ICD-10-CM

## 2013-02-07 DIAGNOSIS — J449 Chronic obstructive pulmonary disease, unspecified: Secondary | ICD-10-CM

## 2013-02-07 DIAGNOSIS — G609 Hereditary and idiopathic neuropathy, unspecified: Secondary | ICD-10-CM

## 2013-02-08 ENCOUNTER — Encounter: Payer: Self-pay | Admitting: Pharmacist

## 2013-02-08 LAB — PROTIME-INR: INR: 2.3 — AB (ref 0.9–1.1)

## 2013-02-13 ENCOUNTER — Other Ambulatory Visit: Payer: Self-pay | Admitting: *Deleted

## 2013-02-13 MED ORDER — OXYCODONE HCL 10 MG PO TABS: ORAL_TABLET | ORAL | Status: DC

## 2013-02-16 ENCOUNTER — Encounter: Payer: Self-pay | Admitting: Endocrinology

## 2013-02-16 ENCOUNTER — Ambulatory Visit (INDEPENDENT_AMBULATORY_CARE_PROVIDER_SITE_OTHER): Payer: 59 | Admitting: Endocrinology

## 2013-02-16 VITALS — BP 126/72 | HR 78

## 2013-02-16 DIAGNOSIS — E1029 Type 1 diabetes mellitus with other diabetic kidney complication: Secondary | ICD-10-CM

## 2013-02-16 DIAGNOSIS — E1065 Type 1 diabetes mellitus with hyperglycemia: Secondary | ICD-10-CM

## 2013-02-16 DIAGNOSIS — E119 Type 2 diabetes mellitus without complications: Secondary | ICD-10-CM

## 2013-02-16 NOTE — Progress Notes (Signed)
Subjective:    Patient ID: Perry Drum., male    DOB: 1950/12/28, 62 y.o.   MRN: [id number]  HPI Pt returns for f/u of insulin-requiring DM (dx'ed 1983; he has moderate painful neuropathy of the lower extremities; he has associated foot ulcer, retinopathy, gastroparesis, peripheral sensory neuropathy, CAD, and renal failure).  He recently had left BKA.  He lives temporarily in [place] [facility].  He estimates 1 more month there.  Our office called [facility], and obtained a cbg record.  However, it is scant and illegible.   Past Medical History  Diagnosis Date  . Chronic kidney disease     dialysis t,th,sat  . GERD (gastroesophageal reflux disease)   . Hypertension   . Neuromuscular disorder     neurophathy  . Atrial fibrillation     chronically anticoagulated with coumadin  . Hyperlipidemia   . Atrial flutter 07/07/2009    s/p ablation  . DIABETES MELLITUS, TYPE II 08/01/2008  . GOUT 08/01/2008  . ERECTILE DYSFUNCTION 08/01/2008  . PERIPHERAL NEUROPATHY 08/01/2008  . CORONARY ARTERY DISEASE 08/01/2008    non obstructive per cath 1/10; Dr. Juanda Chance  . 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 Gastroparesis 08/01/2008  . End stage renal failure on dialysis 08/01/2008  . NEPHROLITHIASIS, HX OF 08/01/2008  . Chronic systolic heart failure 06/21/2010  . COPD 07/21/2010  . Diverticulosis of colon (without mention of hemorrhage) 10/28/2010  . Benign neoplasm of colon 10/28/2010  . Hemorrhoids, internal 10/28/2010  . Renal cyst     bilateral  . Second degree Mobitz I AV block     asymptomatic  . Cyst     infected cyst on back   . Muscle spasm of back     back muscle spasms and pt is unable to lay flat   . Abscess of back     lower middle portion of back  . Headache(784.0)     HX; of migraines 25-30 years ago  . Cancer     bBasal cell on nose    Past Surgical History  Procedure Laterality Date  . Atrial ablation surgery  07-2008, 07/08/09    Dr Johney Frame  . Right foot   09-2009    infection  . Colonoscopy  67yrs ago    Dr.Orr  . Av fistula placement      left upper arm  . S/p right arm surgery after fracture    . Pilonidal cyst excision      s/p  . Cataract extraction      Hx: right eye  . Tonsillectomy    . I&d extremity Left 12/29/2012    Procedure: IRRIGATION AND DEBRIDEMENT EXTREMITY;  Surgeon: Nadara Mustard, MD;  Location: [facility] OR;  Service: Orthopedics;  Laterality: Left;  Irrigation and Debridement Left Leg, VAC, [place]  . Toe amputation Right 01/18/2013    Dr [name]  . I&d extremity Bilateral 01/18/2013    Procedure: IRRIGATION AND DEBRIDEMENT EXTREMITYLEFT LEG AND RIGHT FOOT;  Surgeon: Nadara Mustard, MD;  Location: [facility] OR;  Service: Orthopedics;  Laterality: Bilateral;  Left Leg Excisional Debridement  . Skin graft Left     left leg within the past month  . Amputation Left 02/02/2013    Procedure: AMPUTATION BELOW KNEE;  Surgeon: Nadara Mustard, MD;  Location: [facility] OR;  Service: Orthopedics;  Laterality: Left;  Left Below Knee Amputation    History   Social History  . Marital Status: Married  Spouse Name: N/A    Number of Children: 3  . Years of Education: N/A   Occupational History  . Sales Rep    Social History Main Topics  . Smoking status: Current Every Day Smoker -- 0.50 packs/day for 40 years    Types: Cigarettes  . Smokeless tobacco: Never Used  . Alcohol Use: No  . Drug Use: No  . Sexual Activity: Not on file   Other Topics Concern  . 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[employer]          Current Outpatient Prescriptions on File Prior to Visit  Medication Sig Dispense Refill  . allopurinol (ZYLOPRIM) 100 MG tablet Take 1 tablet by mouth daily.       . B Complex-C-Folic Acid (DIALYVITE PO) Take 100 mg by mouth daily.        Marland Kitchen doxycycline (VIBRAMYCIN) 50 MG capsule Take 2 capsules (100 mg total) by mouth 2 (two) times daily.  60 capsule  0  . ethyl chloride spray Apply 1 application topically  daily as needed. For pain      . FOSRENOL 1000 MG chewable tablet Chew 2 tablets by mouth 3 (three) times daily with meals.       . gabapentin (NEURONTIN) 300 MG capsule Take 1 capsule (300 mg total) by mouth at bedtime.  30 capsule  11  . HYDROcodone-acetaminophen (NORCO) 5-325 MG per tablet Take 1 tablet by mouth every 6 (six) hours as needed for pain.  30 tablet  0  . insulin aspart (NOVOLOG FLEXPEN) 100 UNIT/ML injection Inject 30 Units into the skin 3 (three) times daily before meals.  90 mL  3  . Insulin Syringe-Needle U-100 (BD INSULIN SYRINGE ULTRAFINE) 31G X 5/16" 0.5 ML MISC 1 each by Other route 3 (three) times daily.  200 each  3  . loperamide (IMODIUM) 2 MG capsule Take 2 mg by mouth 4 (four) times daily as needed for diarrhea or loose stools.      Marland Kitchen omeprazole (PRILOSEC) 20 MG capsule Take 20 mg by mouth daily.      . Oxycodone HCl 10 MG TABS Take one tablet by mouth every eight hours for pain  90 tablet  0  . silver sulfADIAZINE (SILVADENE) 1 % cream Apply topically daily.  400 g  0  . temazepam (RESTORIL) 30 MG capsule Take 30 mg by mouth at bedtime.      Marland Kitchen warfarin (COUMADIN) 5 MG tablet Take 2.5-5 mg by mouth every evening. Tues & Thurs take 2.5mg  & 5mg  on all other days       No current facility-administered medications on file prior to visit.    No Known Allergies  Family History  Problem Relation Age of Onset  . Diabetes Mother   . Diabetes Maternal Grandmother   . Diabetes Paternal Grandmother   . Cancer Neg Hx     no FH of Colon Cancer    BP 126/72  Pulse 78  SpO2 97%  Review of Systems denies hypoglycemia and weight change (excpt for BKA).      Objective:   Physical Exam VITAL SIGNS:  See vs page GENERAL: no distress.  In wheelchair      Assessment & Plan:  DM: This insulin regimen was chosen from multiple options, as it best matches his insulin to his changing requirements throughout the day.  The benefits of glycemic control must be weighed against  the risks of hypoglycemia.  Renal failure: in this setting, he may not need basal insulin. BKA: this limits exercise rx of DM

## 2013-02-16 NOTE — Patient Instructions (Addendum)
Please check resident's blood sugar 4 times a day: before the 3 meals, and at bedtime.  also check if you have symptoms of your blood sugar being too high or too low.  please keep a record of the readings and bring it to your next appointment here.  please call us sooner if your blood sugar goes below 70, or if you have a lot of readings over 200. Please come back here when you are back home.

## 2013-02-17 ENCOUNTER — Telehealth: Payer: Self-pay | Admitting: Endocrinology

## 2013-02-17 NOTE — Telephone Encounter (Signed)
Please call [facility]: i need a more extensive and legible cbg record

## 2013-02-19 ENCOUNTER — Telehealth: Payer: Self-pay | Admitting: Endocrinology

## 2013-02-19 NOTE — Telephone Encounter (Signed)
Spoke with Marisue Ivan will fax order to charge nurse fax # [phone]

## 2013-02-19 NOTE — Telephone Encounter (Signed)
Please call pt's facility Please add lantus, 15 units qhs, and: Reduce novolog to 25 units 3 times a day (just before each meal). Please come back for a follow-up appointment in 1 month.

## 2013-02-19 NOTE — Telephone Encounter (Signed)
Spoke with Marisue Ivan at [facility] will refax cbg record [phone]

## 2013-03-02 ENCOUNTER — Non-Acute Institutional Stay (SKILLED_NURSING_FACILITY): Payer: 59 | Admitting: Adult Health

## 2013-03-02 DIAGNOSIS — Z7901 Long term (current) use of anticoagulants: Secondary | ICD-10-CM

## 2013-03-02 DIAGNOSIS — I4891 Unspecified atrial fibrillation: Secondary | ICD-10-CM

## 2013-03-06 ENCOUNTER — Other Ambulatory Visit: Payer: Self-pay

## 2013-03-06 MED ORDER — TEMAZEPAM 30 MG PO CAPS: 30.0000 mg | ORAL_CAPSULE | Freq: Every day | ORAL | Status: DC

## 2013-03-06 NOTE — Telephone Encounter (Signed)
Verified dose and instructions reflect manual request received by nursing home.   

## 2013-03-07 ENCOUNTER — Non-Acute Institutional Stay (SKILLED_NURSING_FACILITY): Payer: 59 | Admitting: Adult Health

## 2013-03-07 DIAGNOSIS — M9080 Osteopathy in diseases classified elsewhere, unspecified site: Secondary | ICD-10-CM

## 2013-03-07 DIAGNOSIS — E1169 Type 2 diabetes mellitus with other specified complication: Secondary | ICD-10-CM

## 2013-03-07 DIAGNOSIS — M869 Osteomyelitis, unspecified: Secondary | ICD-10-CM

## 2013-03-09 ENCOUNTER — Non-Acute Institutional Stay (SKILLED_NURSING_FACILITY): Payer: 59 | Admitting: Internal Medicine

## 2013-03-09 DIAGNOSIS — E1059 Type 1 diabetes mellitus with other circulatory complications: Secondary | ICD-10-CM

## 2013-03-09 DIAGNOSIS — E1169 Type 2 diabetes mellitus with other specified complication: Secondary | ICD-10-CM

## 2013-03-09 DIAGNOSIS — M869 Osteomyelitis, unspecified: Secondary | ICD-10-CM

## 2013-03-09 DIAGNOSIS — G609 Hereditary and idiopathic neuropathy, unspecified: Secondary | ICD-10-CM

## 2013-03-09 DIAGNOSIS — M9080 Osteopathy in diseases classified elsewhere, unspecified site: Secondary | ICD-10-CM

## 2013-03-09 DIAGNOSIS — I2510 Atherosclerotic heart disease of native coronary artery without angina pectoris: Secondary | ICD-10-CM

## 2013-03-09 NOTE — Progress Notes (Signed)
PROGRESS NOTE  DATE: 03/09/2013  FACILITY: Nursing Home Location: [facility]  LEVEL OF CARE: SNF (31)  Routine Visit  CHIEF COMPLAINT:  Manage left foot osteomyelitis, diabetes mellitus and peripheral neuropathy  HISTORY OF PRESENT ILLNESS:  REASSESSMENT OF ONGOING PROBLEM(S):  LEFT FOOT OSTEOMYELITIS: Patient is status post left BKA. He is currently undergoing short-term rehabilitation. He denies stump site pain. Doxycycline was started due to stump infection.  DM:pt's DM remains stable.  Pt denies polyuria, polydipsia, polyphagia, changes in vision or hypoglycemic episodes.  No complications noted from the medication presently being used.  Last hemoglobin A1c not available.  PERIPHERAL NEUROPATHY: The peripheral neuropathy is stable. The patient denies pain in the feet, tingling, and numbness. No complications noted from the medication presently being used.  PAST MEDICAL HISTORY : Reviewed.  No changes.  CURRENT MEDICATIONS: Reviewed per [facility]  REVIEW OF SYSTEMS:  GENERAL: no change in appetite, no fatigue, no weight changes, no fever, chills or weakness RESPIRATORY: no cough, SOB, DOE, wheezing, hemoptysis CARDIAC: no chest pain, edema or palpitations GI: no abdominal pain, diarrhea, constipation, heart burn, nausea or vomiting  PHYSICAL EXAMINATION  VS:  T       P 84      RR      BP 130/78     POX %     WT (Lb)  GENERAL: no acute distress, moderately obese body habitus EYES: conjunctivae normal, sclerae normal, normal eye lids NECK: supple, trachea midline, no neck masses, no thyroid tenderness, no thyromegaly LYMPHATICS: no LAN in the neck, no supraclavicular LAN RESPIRATORY: breathing is even & unlabored, BS CTAB CARDIAC: Heart rate is irregularly irregular, no murmur,no extra heart sounds, no edema GI: abdomen soft, normal BS, no masses, no tenderness, no hepatomegaly, no splenomegaly PSYCHIATRIC: the patient is alert & oriented to person,  affect & behavior appropriate  LABS/RADIOLOGY:  9-14 hemoglobin 8.5, MCV 95.1, platelets 518, WBC 10.5, glucose 267, BUN 29, creatinine 5 otherwise BMP normal 8-14 creatinine 7.2, CO2 21, phosphorus 7.1, albumin 3.4, glucose 136, serum iron 85, TIBC 177, percent saturation 14  ASSESSMENT/PLAN:  Left foot osteomyelitis Nash status post left BKA. Continue doxycycline for infection. Diabetes mellitus with vascular complications-check hemoglobin A1c Peripheral neuropathy-continue Neurontin CAD-stable Renovascular hypertension-well-controlled Atrial fibrillation-rate controlled COPD-stable Hypophosphatemia-PhosLo was decreased End-stage renal disease-continue hemodialysis Check liver profile  CPT CODE: 08657

## 2013-03-12 ENCOUNTER — Telehealth: Payer: Self-pay | Admitting: *Deleted

## 2013-03-12 NOTE — Progress Notes (Signed)
Patient ID: Perry Drum., male   DOB: Jul 08, 1950, 62 y.o.   MRN: [id number]        HISTORY & PHYSICAL  DATE: 02/07/2013   FACILITY: [facility]  LEVEL OF CARE: SNF (31)  ALLERGIES:  No Known Allergies  CHIEF COMPLAINT:  Manage left foot gangrene, diabetes mellitus, and COPD.    HISTORY OF PRESENT ILLNESS:  The patient is a 62 year-old, Caucasian male.    LEFT FOOT GANGRENE:  Patient was having osteomyelitis of the left foot with ulceration and gangrene.  Therefore, he underwent a transtibial amputation on the left and tolerated the procedure well.  He is admitted to this facility for short-term rehabilitation.  He complains of ongoing left foot pain.   DM:pt's DM remains stable.  Pt denies polyuria, polydipsia, polyphagia, changes in vision or hypoglycemic episodes.  No complications noted from the medication presently being used.  Last hemoglobin A1c is:  Not available.    COPD: the COPD remains stable.  Pt denies sob, cough, wheezing or declining exercise tolerance.  No complications from the medications presently being used.      PAST MEDICAL HISTORY :  Past Medical History  Diagnosis Date  . Chronic kidney disease     dialysis t,th,sat  . GERD (gastroesophageal reflux disease)   . Hypertension   . Neuromuscular disorder     neurophathy  . Atrial fibrillation     chronically anticoagulated with coumadin  . Hyperlipidemia   . Atrial flutter 07/07/2009    s/p ablation  . DIABETES MELLITUS, TYPE II 08/01/2008  . GOUT 08/01/2008  . ERECTILE DYSFUNCTION 08/01/2008  . PERIPHERAL NEUROPATHY 08/01/2008  . CORONARY ARTERY DISEASE 08/01/2008    non obstructive per cath 1/10; Dr. Juanda Chance  . 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 Gastroparesis 08/01/2008  . End stage renal failure on dialysis 08/01/2008  . NEPHROLITHIASIS, HX OF 08/01/2008  . Chronic systolic heart failure 06/21/2010  . COPD 07/21/2010  . Diverticulosis of colon (without mention  of hemorrhage) 10/28/2010  . Benign neoplasm of colon 10/28/2010  . Hemorrhoids, internal 10/28/2010  . Renal cyst     bilateral  . Second degree Mobitz I AV block     asymptomatic  . Cyst     infected cyst on back   . Muscle spasm of back     back muscle spasms and pt is unable to lay flat   . Abscess of back     lower middle portion of back  . Headache(784.0)     HX; of migraines 25-30 years ago  . Cancer     bBasal cell on nose    PAST SURGICAL HISTORY: Past Surgical History  Procedure Laterality Date  . Atrial ablation surgery  07-2008, 07/08/09    Dr Johney Frame  . Right foot  09-2009    infection  . Colonoscopy  4yrs ago    Dr.Orr  . Av fistula placement      left upper arm  . S/p right arm surgery after fracture    . Pilonidal cyst excision      s/p  . Cataract extraction      Hx: right eye  . Tonsillectomy    . I&d extremity Left 12/29/2012    Procedure: IRRIGATION AND DEBRIDEMENT EXTREMITY;  Surgeon: Nadara Mustard, MD;  Location: [facility] OR;  Service: Orthopedics;  Laterality: Left;  Irrigation and Debridement Left Leg, VAC, [place]askin  . Toe  amputation Right 01/18/2013    Dr [name]  . I&d extremity Bilateral 01/18/2013    Procedure: IRRIGATION AND DEBRIDEMENT EXTREMITYLEFT LEG AND RIGHT FOOT;  Surgeon: Nadara Mustard, MD;  Location: [facility] OR;  Service: Orthopedics;  Laterality: Bilateral;  Left Leg Excisional Debridement  . Skin graft Left     left leg within the past month  . Amputation Left 02/02/2013    Procedure: AMPUTATION BELOW KNEE;  Surgeon: Nadara Mustard, MD;  Location: [facility] OR;  Service: Orthopedics;  Laterality: Left;  Left Below Knee Amputation    SOCIAL HISTORY:  reports that he has been smoking Cigarettes.  He has a 20 pack-year smoking history. He has never used smokeless tobacco. He reports that he does not drink alcohol or use illicit drugs.  FAMILY HISTORY:  Family History  Problem Relation Age of Onset  . Diabetes Mother   . Diabetes Maternal Grandmother   .  Diabetes Paternal Grandmother   . Cancer Neg Hx     no FH of Colon Cancer    CURRENT MEDICATIONS: Reviewed per [facility]  REVIEW OF SYSTEMS:   NEUROLOGICAL:    Complains of uncontrolled neuropathic pain.    See HPI otherwise 14 point ROS is negative.  PHYSICAL EXAMINATION  VS:  T 100.8       P 107      RR 18      BP 112/81     POX 94%        WT (Lb) 238.2    GENERAL: no acute distress, morbidly obese body habitus EYES: conjunctivae normal, sclerae normal, normal eye lids MOUTH/THROAT: lips without lesions,no lesions in the mouth,tongue is without lesions,uvula elevates in midline NECK: supple, trachea midline, no neck masses, no thyroid tenderness, no thyromegaly LYMPHATICS: no LAN in the neck, no supraclavicular LAN RESPIRATORY: breathing is even & unlabored, BS CTAB CARDIAC: RRR, no murmur,no extra heart sounds EDEMA/VARICOSITIES: right lower extremity has +2 edema ARTERIAL: right lower extremity is dressed   GI:  ABDOMEN: abdomen soft, normal BS, no masses, no tenderness  LIVER/SPLEEN: no hepatomegaly, no splenomegaly MUSCULOSKELETAL: HEAD: normal to inspection & palpation BACK: no kyphosis, scoliosis or spinal processes tenderness EXTREMITIES: LEFT UPPER EXTREMITY: full range of motion, normal strength & tone RIGHT UPPER EXTREMITY:  full range of motion, normal strength & tone LEFT LOWER EXTREMITY: left BKA RIGHT LOWER EXTREMITY:  full range of motion, normal strength & tone PSYCHIATRIC: the patient is alert & oriented to person, affect & behavior appropriate  LABS/RADIOLOGY: Glucose 216, BUN 13, creatinine 6.66, otherwise BMP normal.    Phosphorus 6.6, albumin 3.2, otherwise liver profile normal.      Hemoglobin 8.3, MCV 94.2, otherwise CBC normal.    ASSESSMENT/PLAN:  Left foot gangrene.  Status post left BKA.  Continue rehabilitation.    Diabetes mellitus with vascular complications.  Continue current medications.    COPD.  Well compensated.    Peripheral  neuropathy.  Uncontrolled problem.  Increase Neurontin to b.i.d.      Left lower extremity pain.  Uncontrolled problem.  Increase Vicodin to 5/325 q.6 routine.    Gout.  Continue allopurinol.   GERD.  Well controlled.      Check CBC and BMP.    I have reviewed patient's medical records received at admission/from hospitalization.  CPT CODE: 08657

## 2013-03-12 NOTE — Telephone Encounter (Signed)
Spoke with pt and he is still in [facility], he states he is going to probably be there for another 4-5 weeks for healing and fitting of prothesis. Pt states staff at [facility] is using Co-ag machine to check his INR and they are presently dosing his coumadin. He will call us when he is discharged.

## 2013-03-16 ENCOUNTER — Non-Acute Institutional Stay (SKILLED_NURSING_FACILITY): Payer: 59 | Admitting: Adult Health

## 2013-03-16 DIAGNOSIS — I4891 Unspecified atrial fibrillation: Secondary | ICD-10-CM

## 2013-03-16 DIAGNOSIS — Z7901 Long term (current) use of anticoagulants: Secondary | ICD-10-CM

## 2013-03-20 ENCOUNTER — Other Ambulatory Visit (HOSPITAL_COMMUNITY): Payer: Self-pay | Admitting: Orthopedic Surgery

## 2013-03-21 MED ORDER — ALTEPLASE 2 MG IJ SOLR: 2.0000 mg | Freq: Once | INTRAMUSCULAR | Status: DC | PRN

## 2013-03-21 MED ORDER — LIDOCAINE-PRILOCAINE 2.5-2.5 % EX CREA: 1.0000 "application " | TOPICAL_CREAM | CUTANEOUS | Status: DC | PRN

## 2013-03-21 MED ORDER — SODIUM CHLORIDE 0.9 % IV SOLN: 100.0000 mL | INTRAVENOUS | Status: DC | PRN

## 2013-03-21 MED ORDER — LIDOCAINE HCL (PF) 1 % IJ SOLN: 5.0000 mL | INTRAMUSCULAR | Status: DC | PRN

## 2013-03-21 MED ORDER — HEPARIN SODIUM (PORCINE) 1000 UNIT/ML DIALYSIS: 1000.0000 [IU] | INTRAMUSCULAR | Status: DC | PRN

## 2013-03-21 MED ORDER — PENTAFLUOROPROP-TETRAFLUOROETH EX AERO: 1.0000 "application " | INHALATION_SPRAY | CUTANEOUS | Status: DC | PRN

## 2013-03-21 MED ORDER — NEPRO/CARBSTEADY PO LIQD: 237.0000 mL | ORAL | Status: DC | PRN

## 2013-03-22 ENCOUNTER — Encounter (HOSPITAL_COMMUNITY): Payer: Self-pay | Admitting: *Deleted

## 2013-03-22 MED ORDER — CEFAZOLIN SODIUM-DEXTROSE 2-3 GM-% IV SOLR
2.0000 g | INTRAVENOUS | Status: AC
  Administered 2013-03-23: 2 g via INTRAVENOUS
  Filled 2013-03-22: qty 50

## 2013-03-23 ENCOUNTER — Encounter (HOSPITAL_COMMUNITY): Payer: Self-pay | Admitting: Pharmacy Technician

## 2013-03-23 ENCOUNTER — Encounter (HOSPITAL_COMMUNITY): Payer: Self-pay | Admitting: *Deleted

## 2013-03-23 ENCOUNTER — Ambulatory Visit (HOSPITAL_COMMUNITY)
Admission: RE | Admit: 2013-03-23 | Discharge: 2013-03-23 | Disposition: A | Discharge status: Skilled Nursing Facility | Payer: 59 | Source: Ambulatory Visit | Attending: Orthopedic Surgery | Admitting: Orthopedic Surgery

## 2013-03-23 ENCOUNTER — Ambulatory Visit: Payer: 59 | Admitting: Endocrinology

## 2013-03-23 ENCOUNTER — Ambulatory Visit (HOSPITAL_COMMUNITY): Payer: 59 | Admitting: Anesthesiology

## 2013-03-23 ENCOUNTER — Encounter (HOSPITAL_COMMUNITY): Payer: 59 | Admitting: Anesthesiology

## 2013-03-23 ENCOUNTER — Encounter (HOSPITAL_COMMUNITY)
Admission: RE | Disposition: A | Discharge status: Skilled Nursing Facility | Payer: Self-pay | Source: Ambulatory Visit | Attending: Orthopedic Surgery

## 2013-03-23 DIAGNOSIS — T8789 Other complications of amputation stump: Secondary | ICD-10-CM | POA: Insufficient documentation

## 2013-03-23 DIAGNOSIS — E1142 Type 2 diabetes mellitus with diabetic polyneuropathy: Secondary | ICD-10-CM | POA: Insufficient documentation

## 2013-03-23 DIAGNOSIS — J4489 Other specified chronic obstructive pulmonary disease: Secondary | ICD-10-CM | POA: Insufficient documentation

## 2013-03-23 DIAGNOSIS — Y835 Amputation of limb(s) as the cause of abnormal reaction of the patient, or of later complication, without mention of misadventure at the time of the procedure: Secondary | ICD-10-CM | POA: Insufficient documentation

## 2013-03-23 DIAGNOSIS — E1149 Type 2 diabetes mellitus with other diabetic neurological complication: Secondary | ICD-10-CM | POA: Insufficient documentation

## 2013-03-23 DIAGNOSIS — I120 Hypertensive chronic kidney disease with stage 5 chronic kidney disease or end stage renal disease: Secondary | ICD-10-CM | POA: Insufficient documentation

## 2013-03-23 DIAGNOSIS — I798 Other disorders of arteries, arterioles and capillaries in diseases classified elsewhere: Secondary | ICD-10-CM | POA: Insufficient documentation

## 2013-03-23 DIAGNOSIS — Z7901 Long term (current) use of anticoagulants: Secondary | ICD-10-CM | POA: Insufficient documentation

## 2013-03-23 DIAGNOSIS — Z992 Dependence on renal dialysis: Secondary | ICD-10-CM | POA: Insufficient documentation

## 2013-03-23 DIAGNOSIS — J449 Chronic obstructive pulmonary disease, unspecified: Secondary | ICD-10-CM | POA: Insufficient documentation

## 2013-03-23 DIAGNOSIS — N186 End stage renal disease: Secondary | ICD-10-CM | POA: Insufficient documentation

## 2013-03-23 DIAGNOSIS — E1159 Type 2 diabetes mellitus with other circulatory complications: Secondary | ICD-10-CM | POA: Insufficient documentation

## 2013-03-23 DIAGNOSIS — E1169 Type 2 diabetes mellitus with other specified complication: Secondary | ICD-10-CM

## 2013-03-23 HISTORY — PX: AMPUTATION: SHX166

## 2013-03-23 LAB — BASIC METABOLIC PANEL
BUN: 30 mg/dL — ABNORMAL HIGH (ref 6–23)
CO2: 29 mEq/L (ref 19–32)
Calcium: 9.4 mg/dL (ref 8.4–10.5)
Chloride: 98 mEq/L (ref 96–112)
Creatinine, Ser: 5.08 mg/dL — ABNORMAL HIGH (ref 0.50–1.35)
Glucose, Bld: 191 mg/dL — ABNORMAL HIGH (ref 70–99)
Sodium: 141 mEq/L (ref 135–145)

## 2013-03-23 LAB — CBC
Hemoglobin: 9.1 g/dL — ABNORMAL LOW (ref 13.0–17.0)
MCV: 87.6 fL (ref 78.0–100.0)
Platelets: 509 10*3/uL — ABNORMAL HIGH (ref 150–400)
RBC: 3.54 MIL/uL — ABNORMAL LOW (ref 4.22–5.81)
WBC: 12.1 10*3/uL — ABNORMAL HIGH (ref 4.0–10.5)

## 2013-03-23 LAB — GLUCOSE, CAPILLARY
Glucose-Capillary: 200 mg/dL — ABNORMAL HIGH (ref 70–99)
Glucose-Capillary: 154 mg/dL — ABNORMAL HIGH (ref 70–99)

## 2013-03-23 LAB — TYPE AND SCREEN: ABO/RH(D): O POS

## 2013-03-23 LAB — PROTIME-INR: Prothrombin Time: 19.3 seconds — ABNORMAL HIGH (ref 11.6–15.2)

## 2013-03-23 LAB — ABO/RH: ABO/RH(D): O POS

## 2013-03-23 SURGERY — AMPUTATION BELOW KNEE
Anesthesia: General | Site: Leg Lower | Laterality: Left | Wound class: Dirty or Infected

## 2013-03-23 MED ORDER — PROMETHAZINE HCL 25 MG/ML IJ SOLN: 6.2500 mg | INTRAMUSCULAR | Status: DC | PRN

## 2013-03-23 MED ORDER — ARTIFICIAL TEARS OP OINT
TOPICAL_OINTMENT | OPHTHALMIC | Status: DC | PRN
  Administered 2013-03-23: 1 via OPHTHALMIC

## 2013-03-23 MED ORDER — FENTANYL CITRATE 0.05 MG/ML IJ SOLN
INTRAMUSCULAR | Status: DC | PRN
  Administered 2013-03-23 (×4): 50 ug via INTRAVENOUS

## 2013-03-23 MED ORDER — ONDANSETRON HCL 4 MG/2ML IJ SOLN
INTRAMUSCULAR | Status: DC | PRN
  Administered 2013-03-23: 4 mg via INTRAMUSCULAR

## 2013-03-23 MED ORDER — LIDOCAINE HCL (CARDIAC) 20 MG/ML IV SOLN
INTRAVENOUS | Status: DC | PRN
  Administered 2013-03-23: 40 mg via INTRAVENOUS

## 2013-03-23 MED ORDER — SODIUM CHLORIDE 0.9 % IV SOLN
INTRAVENOUS | Status: DC | PRN
  Administered 2013-03-23: 14:00:00 via INTRAVENOUS

## 2013-03-23 MED ORDER — OXYCODONE HCL 5 MG/5ML PO SOLN: 5.0000 mg | Freq: Once | ORAL | Status: DC | PRN

## 2013-03-23 MED ORDER — PROPOFOL 10 MG/ML IV BOLUS
INTRAVENOUS | Status: DC | PRN
  Administered 2013-03-23: 200 mg via INTRAVENOUS

## 2013-03-23 MED ORDER — MIDAZOLAM HCL 2 MG/2ML IJ SOLN: 0.5000 mg | Freq: Once | INTRAMUSCULAR | Status: DC | PRN

## 2013-03-23 MED ORDER — HYDROMORPHONE HCL PF 1 MG/ML IJ SOLN
INTRAMUSCULAR | Status: AC
  Filled 2013-03-23: qty 1

## 2013-03-23 MED ORDER — SODIUM CHLORIDE 0.9 % IV SOLN
INTRAVENOUS | Status: DC
  Administered 2013-03-23: 12:00:00 via INTRAVENOUS

## 2013-03-23 MED ORDER — MEPERIDINE HCL 25 MG/ML IJ SOLN: 6.2500 mg | INTRAMUSCULAR | Status: DC | PRN

## 2013-03-23 MED ORDER — MIDAZOLAM HCL 5 MG/5ML IJ SOLN
INTRAMUSCULAR | Status: DC | PRN
  Administered 2013-03-23 (×2): 1 mg via INTRAVENOUS

## 2013-03-23 MED ORDER — 0.9 % SODIUM CHLORIDE (POUR BTL) OPTIME
TOPICAL | Status: DC | PRN
  Administered 2013-03-23: 1000 mL

## 2013-03-23 MED ORDER — HYDROMORPHONE HCL PF 1 MG/ML IJ SOLN
0.2500 mg | INTRAMUSCULAR | Status: DC | PRN
  Administered 2013-03-23 (×3): 0.5 mg via INTRAVENOUS

## 2013-03-23 MED ORDER — PHENYLEPHRINE HCL 10 MG/ML IJ SOLN
INTRAMUSCULAR | Status: DC | PRN
  Administered 2013-03-23: 80 ug via INTRAVENOUS
  Administered 2013-03-23: 120 ug via INTRAVENOUS
  Administered 2013-03-23: 80 ug via INTRAVENOUS

## 2013-03-23 MED ORDER — OXYCODONE HCL 5 MG PO TABS
5.0000 mg | ORAL_TABLET | Freq: Once | ORAL | Status: DC | PRN
Start: 2013-03-23 — End: 2013-03-23

## 2013-03-23 SURGICAL SUPPLY — 49 items
BANDAGE ESMARK 6X9 LF (GAUZE/BANDAGES/DRESSINGS) ×1 IMPLANT
BANDAGE GAUZE ELAST BULKY 4 IN (GAUZE/BANDAGES/DRESSINGS) ×3 IMPLANT
BLADE SAW RECIP 87.9 MT (BLADE) ×2 IMPLANT
BLADE SURG 21 STRL SS (BLADE) ×2 IMPLANT
BNDG CMPR 9X6 STRL LF SNTH (GAUZE/BANDAGES/DRESSINGS)
BNDG COHESIVE 6X5 TAN STRL LF (GAUZE/BANDAGES/DRESSINGS) ×3 IMPLANT
BNDG ESMARK 6X9 LF (GAUZE/BANDAGES/DRESSINGS)
CLOTH BEACON ORANGE TIMEOUT ST (SAFETY) ×2 IMPLANT
CONT SPECI 4OZ STER CLIK (MISCELLANEOUS) ×1 IMPLANT
COVER SURGICAL LIGHT HANDLE (MISCELLANEOUS) ×2 IMPLANT
CUFF TOURNIQUET SINGLE 34IN LL (TOURNIQUET CUFF) IMPLANT
CUFF TOURNIQUET SINGLE 44IN (TOURNIQUET CUFF) IMPLANT
DRAIN PENROSE 1/2X12 LTX STRL (WOUND CARE) IMPLANT
DRAPE EXTREMITY T 121X128X90 (DRAPE) ×2 IMPLANT
DRAPE PROXIMA HALF (DRAPES) ×4 IMPLANT
DRAPE U-SHAPE 47X51 STRL (DRAPES) ×4 IMPLANT
DRSG ADAPTIC 3X8 NADH LF (GAUZE/BANDAGES/DRESSINGS) ×2 IMPLANT
DRSG PAD ABDOMINAL 8X10 ST (GAUZE/BANDAGES/DRESSINGS) ×3 IMPLANT
DURAPREP 26ML APPLICATOR (WOUND CARE) ×2 IMPLANT
ELECT REM PT RETURN 9FT ADLT (ELECTROSURGICAL) ×2
ELECTRODE REM PT RTRN 9FT ADLT (ELECTROSURGICAL) ×1 IMPLANT
GLOVE BIOGEL PI IND STRL 6.5 (GLOVE) IMPLANT
GLOVE BIOGEL PI IND STRL 9 (GLOVE) ×1 IMPLANT
GLOVE BIOGEL PI INDICATOR 6.5 (GLOVE) ×3
GLOVE BIOGEL PI INDICATOR 9 (GLOVE) ×1
GLOVE ECLIPSE 6.5 STRL STRAW (GLOVE) ×1 IMPLANT
GLOVE SURG ORTHO 9.0 STRL STRW (GLOVE) ×2 IMPLANT
GOWN PREVENTION PLUS XLARGE (GOWN DISPOSABLE) ×2 IMPLANT
GOWN SRG XL XLNG 56XLVL 4 (GOWN DISPOSABLE) ×1 IMPLANT
GOWN STRL NON-REIN XL XLG LVL4 (GOWN DISPOSABLE) ×2
KIT BASIN OR (CUSTOM PROCEDURE TRAY) ×2 IMPLANT
KIT ROOM TURNOVER OR (KITS) ×2 IMPLANT
MANIFOLD NEPTUNE II (INSTRUMENTS) ×2 IMPLANT
NS IRRIG 1000ML POUR BTL (IV SOLUTION) ×2 IMPLANT
PACK GENERAL/GYN (CUSTOM PROCEDURE TRAY) ×2 IMPLANT
PAD ARMBOARD 7.5X6 YLW CONV (MISCELLANEOUS) ×4 IMPLANT
SPONGE GAUZE 4X4 12PLY (GAUZE/BANDAGES/DRESSINGS) ×2 IMPLANT
SPONGE LAP 18X18 X RAY DECT (DISPOSABLE) IMPLANT
STAPLER VISISTAT 35W (STAPLE) IMPLANT
STOCKINETTE IMPERVIOUS LG (DRAPES) ×2 IMPLANT
SUT ETHILON 2 0 PSLX (SUTURE) ×3 IMPLANT
SUT PDS AB 1 CT  36 (SUTURE)
SUT PDS AB 1 CT 36 (SUTURE) IMPLANT
SUT SILK 2 0 (SUTURE) ×2
SUT SILK 2-0 18XBRD TIE 12 (SUTURE) ×1 IMPLANT
TOWEL OR 17X24 6PK STRL BLUE (TOWEL DISPOSABLE) ×2 IMPLANT
TOWEL OR 17X26 10 PK STRL BLUE (TOWEL DISPOSABLE) ×2 IMPLANT
TUBE ANAEROBIC SPECIMEN COL (MISCELLANEOUS) IMPLANT
WATER STERILE IRR 1000ML POUR (IV SOLUTION) ×2 IMPLANT

## 2013-03-23 NOTE — H&P (Signed)
[name]. is an 62 y.o. male.   Chief Complaint: Dehiscence left transtibial amputation HPI: Patient is a 62 year old gentleman diabetes peripheral vascular disease renal insufficiency who is status post a transtibial amputation. Patient has had progressive dehiscence has failed conservative wound care and presents at this time for revision of the transtibial amputation.  Past Medical History  Diagnosis Date  . Chronic kidney disease     dialysis t,th,sat  . GERD (gastroesophageal reflux disease)   . Hypertension   . Neuromuscular disorder     neurophathy  . Atrial fibrillation     chronically anticoagulated with coumadin  . Hyperlipidemia   . Atrial flutter 07/07/2009    s/p ablation  . DIABETES MELLITUS, TYPE II 08/01/2008  . GOUT 08/01/2008  . ERECTILE DYSFUNCTION 08/01/2008  . PERIPHERAL NEUROPATHY 08/01/2008  . CORONARY ARTERY DISEASE 08/01/2008    non obstructive per cath 1/10; Dr. Juanda Chance  . 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 Gastroparesis 08/01/2008  . End stage renal failure on dialysis 08/01/2008  . NEPHROLITHIASIS, HX OF 08/01/2008  . Chronic systolic heart failure 06/21/2010  . COPD 07/21/2010  . Diverticulosis of colon (without mention of hemorrhage) 10/28/2010  . Benign neoplasm of colon 10/28/2010  . Hemorrhoids, internal 10/28/2010  . Renal cyst     bilateral  . Second degree Mobitz I AV block     asymptomatic  . Cyst     infected cyst on back   . Muscle spasm of back     back muscle spasms and pt is unable to lay flat   . Abscess of back     lower middle portion of back  . Headache(784.0)     HX; of migraines 25-30 years ago  . Cancer     bBasal cell on nose    Past Surgical History  Procedure Laterality Date  . Atrial ablation surgery  07-2008, 07/08/09    Dr Johney Frame  . Right foot  09-2009    infection  . Colonoscopy  77yrs ago    Dr.Orr  . Av fistula placement      left upper arm  . S/p right arm surgery after fracture    .  Pilonidal cyst excision      s/p  . Cataract extraction      Hx: right eye  . Tonsillectomy    . I&d extremity Left 12/29/2012    Procedure: IRRIGATION AND DEBRIDEMENT EXTREMITY;  Surgeon: Nadara Mustard, MD;  Location: [facility] OR;  Service: Orthopedics;  Laterality: Left;  Irrigation and Debridement Left Leg, VAC, [place]askin  . Toe amputation Right 01/18/2013    Dr [name]  . I&d extremity Bilateral 01/18/2013    Procedure: IRRIGATION AND DEBRIDEMENT EXTREMITYLEFT LEG AND RIGHT FOOT;  Surgeon: Nadara Mustard, MD;  Location: [facility] OR;  Service: Orthopedics;  Laterality: Bilateral;  Left Leg Excisional Debridement  . Skin graft Left     left leg within the past month  . Amputation Left 02/02/2013    Procedure: AMPUTATION BELOW KNEE;  Surgeon: Nadara Mustard, MD;  Location: [facility] OR;  Service: Orthopedics;  Laterality: Left;  Left Below Knee Amputation  . Fracture surgery    . Vasectomy      Family History  Problem Relation Age of Onset  . Diabetes Mother   . Diabetes Maternal Grandmother   . Diabetes Paternal Grandmother   . Cancer Neg Hx     no FH of Colon Cancer  Social History:  reports that he has been smoking Cigarettes.  He has a 20 pack-year smoking history. He has never used smokeless tobacco. He reports that he does not drink alcohol or use illicit drugs.  Allergies: No Known Allergies  No prescriptions prior to admission    No results found for this or any previous visit (from the past 48 hour(s)). No results found.  Review of Systems  All other systems reviewed and are negative.    There were no vitals taken for this visit. Physical Exam  Examination patient has necrotic dehiscence of the entire length of the surgical incision. Assessment/Plan Assessment: Dehiscence left transtibial amputation.  Plan: We will plan for revision of the transtibial amputation. Risks and benefits were discussed including nonhealing of the wound need for additional surgery. Patient states he  understands and wished to proceed at this time.  DUDA,MARCUS V 03/23/2013, 6:31 AM

## 2013-03-23 NOTE — Anesthesia Postprocedure Evaluation (Signed)
  Anesthesia Post-op Note  Patient: [name]  Procedure(s) Performed: Procedure(s) with comments: REVISION OF BELOW THE KNEE  AMPUTATION  (Left) - Left Below Knee Amputation Revision  Patient Location: PACU  Anesthesia Type:General  Level of Consciousness: awake, alert , oriented and patient cooperative  Airway and Oxygen Therapy: Patient Spontanous Breathing  Post-op Pain: none  Post-op Assessment: Post-op Vital signs reviewed, Patient's Cardiovascular Status Stable, Respiratory Function Stable, Patent Airway, No signs of Nausea or vomiting and Pain level controlled  Post-op Vital Signs: Reviewed and stable  Complications: No apparent anesthesia complications

## 2013-03-23 NOTE — Anesthesia Procedure Notes (Signed)
Procedure Name: LMA Insertion Date/Time: 03/23/2013 2:46 PM Performed by: Gayla Medicus Pre-anesthesia Checklist: Patient identified, Timeout performed, Emergency Drugs available, Suction available and Patient being monitored Patient Re-evaluated:Patient Re-evaluated prior to inductionOxygen Delivery Method: Circle system utilized Preoxygenation: Pre-oxygenation with 100% oxygen Intubation Type: IV induction LMA: LMA inserted LMA Size: 5.0 Number of attempts: 1 Placement Confirmation: positive ETCO2 and breath sounds checked- equal and bilateral Tube secured with: Tape Dental Injury: Teeth and Oropharynx as per pre-operative assessment

## 2013-03-23 NOTE — Transfer of Care (Signed)
Immediate Anesthesia Transfer of Care Note  Patient: [name]  Procedure(s) Performed: Procedure(s) with comments: REVISION OF BELOW THE KNEE  AMPUTATION  (Left) - Left Below Knee Amputation Revision  Patient Location: PACU  Anesthesia Type:General  Level of Consciousness: awake, alert  and oriented  Airway & Oxygen Therapy: Patient Spontanous Breathing and Patient connected to nasal cannula oxygen  Post-op Assessment: Report given to PACU RN, Post -op Vital signs reviewed and stable and Patient moving all extremities X 4  Post vital signs: Reviewed and stable  Complications: No apparent anesthesia complications

## 2013-03-23 NOTE — Progress Notes (Signed)
Patient report called to [facility]. Kim RN was given report.  Patient arrived to hospital with an IV already in place. After conversation with RN over at facility, she states the patient is to keep the IV upon return to the facility and may travel with wife by car when returning.  Patient vitals are stable, A&O x3, pain under control at a tolerable level. Wife brought patient's belongings to bedside with patient's wheelchair. No complaints at this time.

## 2013-03-23 NOTE — Anesthesia Preprocedure Evaluation (Addendum)
Anesthesia Evaluation  Patient identified by MRN, date of birth, ID band Patient awake    Reviewed: Allergy & Precautions, H&P , NPO status , Patient's Chart, lab work & pertinent test results  History of Anesthesia Complications Negative for: history of anesthetic complications  Airway Mallampati: II TM Distance: >3 FB Neck ROM: Full    Dental  (+) Teeth Intact and Dental Advisory Given   Pulmonary COPDCurrent Smoker,  breath sounds clear to auscultation  Pulmonary exam normal       Cardiovascular hypertension (controlled with dialysis), + CAD and + Peripheral Vascular Disease + dysrhythmias (INR 1.68 ) Atrial Fibrillation Rhythm:Irregular Rate:Normal  EF 55-60%   Neuro/Psych  Headaches,    GI/Hepatic Neg liver ROS, GERD-  Medicated and Controlled,  Endo/Other  diabetes (glu 200), Type 2, Insulin DependentHypothyroidism   Renal/GU ESRF and DialysisRenal disease (K+ 4.2)HD 03/22/13     Musculoskeletal   Abdominal   Peds  Hematology  (+) Blood dyscrasia (Hb 9.1), anemia ,   Anesthesia Other Findings   Reproductive/Obstetrics                         Anesthesia Physical Anesthesia Plan  ASA: III  Anesthesia Plan: General   Post-op Pain Management:    Induction: Intravenous  Airway Management Planned: LMA  Additional Equipment:   Intra-op Plan:   Post-operative Plan:   Informed Consent: I have reviewed the patients History and Physical, chart, labs and discussed the procedure including the risks, benefits and alternatives for the proposed anesthesia with the patient or authorized representative who has indicated his/her understanding and acceptance.   Dental advisory given  Plan Discussed with: Anesthesiologist, Surgeon and CRNA  Anesthesia Plan Comments: (Plan routine monitors, GA- LMA ok)       Anesthesia Quick Evaluation

## 2013-03-23 NOTE — Op Note (Signed)
OPERATIVE REPORT  DATE OF SURGERY: 03/23/2013  PATIENT:  [name],  62 y.o. male  PRE-OPERATIVE DIAGNOSIS:  Left Below Knee Dehiscence  POST-OPERATIVE DIAGNOSIS:  Left Below Knee Dehiscence  PROCEDURE:  Procedure(s): REVISION OF BELOW THE KNEE  AMPUTATION   SURGEON:  Surgeon(s): Nadara Mustard, MD  ANESTHESIA:   general  EBL:  min ML  SPECIMEN:  No Specimen  TOURNIQUET:  * No tourniquets in log *  PROCEDURE DETAILS: Patient is a 62 year old gentleman end stage renal disease dialysis diabetes status post transtibial amputation who presents at this time with necrosis abscess left transtibial amputation. Patient is failed conservative treatment with antibiotics and wound care and presents at this time for revision amputation. Risks and benefits were discussed including infection neurovascular injury nonhealing of the wound need for higher level amputation. Patient states he understands and wished to proceed at this time. Description of procedure patient brought to the operating room underwent a general anesthetic. After adequate levels of anesthesia were obtained patient's left lower extremity was prepped using DuraPrep draped into a sterile field. Elliptical incision was made around the necrotic tissue and this was carried down to bone. The distal 2 inches of the tibia and fibular were resected. Patient had a large abscess and cultures were obtained. There was a large amount of necrotic muscle and this was also debrided and removed. The wound was irrigated with normal saline there was good petechial bleeding the incision was closed using 2-0 nylon. The wound was covered with Adaptic orthopedic sponges AB dressing Kerlix and Coban. Patient was extubated taken to the PACU in stable condition.  PLAN OF CARE: Discharge to home after PACU  PATIENT DISPOSITION:  PACU - hemodynamically stable.   Nadara Mustard, MD 03/23/2013 3:20 PM

## 2013-03-23 NOTE — Preoperative (Signed)
Beta Blockers   Reason not to administer Beta Blockers:Not Applicable 

## 2013-03-26 ENCOUNTER — Encounter: Payer: Self-pay | Admitting: Endocrinology

## 2013-03-26 ENCOUNTER — Ambulatory Visit (INDEPENDENT_AMBULATORY_CARE_PROVIDER_SITE_OTHER): Payer: 59 | Admitting: Endocrinology

## 2013-03-26 VITALS — BP 126/76 | HR 100

## 2013-03-26 DIAGNOSIS — E119 Type 2 diabetes mellitus without complications: Secondary | ICD-10-CM

## 2013-03-26 LAB — CULTURE, ROUTINE-ABSCESS

## 2013-03-26 NOTE — Patient Instructions (Addendum)
Please check resident's blood sugar 4 times a day: before the 3 meals, and at bedtime.  also check if you have symptoms of your blood sugar being too high or too low.  please keep a record of the readings and bring it to your next appointment here.  please call us sooner if your blood sugar goes below 70, or if you have a lot of readings over 200.   Reduce novolog to 20 units 3 times a day (just before each meal). Increase lantus to 25 units at bedtime Please come back for a follow-up appointment in 1 month.

## 2013-03-26 NOTE — Progress Notes (Signed)
Subjective:    Patient ID: [name], male    DOB: [birth year], 62 y.o.   MRN: [id number]  HPI Pt returns for f/u of insulin-requiring DM (dx'ed 1983, when he presented with polyuria; he has moderate painful neuropathy of the lower extremities; he has associated foot ulcer, retinopathy, gastroparesis, peripheral sensory neuropathy, CAD, and renal failure; he has never had severe hypoglycemia or DKA).  He still lives at [facility].  He estimates a few more weeks there. No cbg record is sent by [facility].  Pt says cbg was 54 at hs, last night.  He says cbg is highest in am.  He had revision of his left TKR last week.  Our office has called [facility], and obtained a cbg record.  It varies from 84-289, but most are in the 100's.  However, it is extremely variable.  It is most consistently high in am.   Past Medical History  Diagnosis Date  . Chronic kidney disease     dialysis t,th,sat  . GERD (gastroesophageal reflux disease)   . Hypertension   . Neuromuscular disorder     neurophathy  . Atrial fibrillation     chronically anticoagulated with coumadin  . Hyperlipidemia   . Atrial flutter 07/07/2009    s/p ablation  . DIABETES MELLITUS, TYPE II 08/01/2008  . GOUT 08/01/2008  . ERECTILE DYSFUNCTION 08/01/2008  . PERIPHERAL NEUROPATHY 08/01/2008  . CORONARY ARTERY DISEASE 08/01/2008    non obstructive per cath 1/10; Dr. Juanda Chance  . 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 Gastroparesis 08/01/2008  . End stage renal failure on dialysis 08/01/2008  . NEPHROLITHIASIS, HX OF 08/01/2008  . Chronic systolic heart failure 06/21/2010  . COPD 07/21/2010  . Diverticulosis of colon (without mention of hemorrhage) 10/28/2010  . Benign neoplasm of colon 10/28/2010  . Hemorrhoids, internal 10/28/2010  . Renal cyst     bilateral  . Second degree Mobitz I AV block     asymptomatic  . Cyst     infected cyst on back   . Muscle spasm of back     back muscle spasms and pt is unable to  lay flat   . Abscess of back     lower middle portion of back  . Headache(784.0)     HX; of migraines 25-30 years ago  . Cancer     bBasal cell on nose    Past Surgical History  Procedure Laterality Date  . Atrial ablation surgery  07-2008, 07/08/09    Dr Johney Frame  . Right foot  09-2009    infection  . Colonoscopy  55yrs ago    Dr.Orr  . Av fistula placement      left upper arm  . S/p right arm surgery after fracture    . Pilonidal cyst excision      s/p  . Cataract extraction      Hx: right eye  . Tonsillectomy    . I&d extremity Left 12/29/2012    Procedure: IRRIGATION AND DEBRIDEMENT EXTREMITY;  Surgeon: Nadara Mustard, MD;  Location: [facility] OR;  Service: Orthopedics;  Laterality: Left;  Irrigation and Debridement Left Leg, VAC, Theraskin  . Toe amputation Right 01/18/2013    Dr [name]  . I&d extremity Bilateral 01/18/2013    Procedure: IRRIGATION AND DEBRIDEMENT EXTREMITYLEFT LEG AND RIGHT FOOT;  Surgeon: Nadara Mustard, MD;  Location: [facility] OR;  Service: Orthopedics;  Laterality: Bilateral;  Left Leg Excisional Debridement  .  Skin graft Left     left leg within the past month  . Amputation Left 02/02/2013    Procedure: AMPUTATION BELOW KNEE;  Surgeon: Nadara Mustard, MD;  Location: [facility] OR;  Service: Orthopedics;  Laterality: Left;  Left Below Knee Amputation  . Fracture surgery    . Vasectomy    . Amputation Left 03/23/2013    Procedure: REVISION OF BELOW THE KNEE  AMPUTATION ;  Surgeon: Nadara Mustard, MD;  Location: [facility];  Service: Orthopedics;  Laterality: Left;  Left Below Knee Amputation Revision    History   Social History  . Marital Status: Married    Spouse Name: N/A    Number of Children: 3  . Years of Education: N/A   Occupational History  . Sales Rep    Social History Main Topics  . Smoking status: Current Every Day Smoker -- 0.50 packs/day for 40 years    Types: Cigarettes  . Smokeless tobacco: Never Used  . Alcohol Use: No  . Drug Use: No  . Sexual Activity: Not  on file   Other Topics Concern  . 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[employer]          Current Outpatient Prescriptions on File Prior to Visit  Medication Sig Dispense Refill  . allopurinol (ZYLOPRIM) 100 MG tablet Take 1 tablet by mouth daily.       . B Complex-C-Folic Acid (DIALYVITE PO) Take 100 mg by mouth daily.        . calcium acetate (PHOSLO) 667 MG capsule Take 667 mg by mouth 3 (three) times daily with meals.      Marland Kitchen dextrose 5 % SOLN 50 mL with cefTAZidime 1 G SOLR 1 g Inject 1 g into the vein daily. For 10 days. Started 03/16/13      . doxycycline (VIBRA-TABS) 100 MG tablet Take 100 mg by mouth 2 (two) times daily. For 1 month. Started 03/07/13      . ethyl chloride spray Apply 1 application topically daily as needed. For pain      . FOSRENOL 1000 MG chewable tablet Chew 2 tablets by mouth 3 (three) times daily with meals.       . gabapentin (NEURONTIN) 300 MG capsule Take 300 mg by mouth 3 (three) times daily.      Marland Kitchen HYDROcodone-acetaminophen (NORCO/VICODIN) 5-325 MG per tablet Take 1 tablet by mouth every 4 (four) hours as needed for pain.      Marland Kitchen insulin aspart (NOVOLOG) 100 UNIT/ML injection Inject 20 Units into the skin 3 (three) times daily before meals.       . insulin glargine (LANTUS) 100 UNIT/ML injection Inject 25 Units into the skin at bedtime.       . Insulin Syringe-Needle U-100 (BD INSULIN SYRINGE ULTRAFINE) 31G X 5/16" 0.5 ML MISC 1 each by Other route 3 (three) times daily.  200 each  3  . loperamide (IMODIUM) 2 MG capsule Take 2 mg by mouth 4 (four) times daily as needed for diarrhea or loose stools.      . metoprolol succinate (TOPROL-XL) 25 MG 24 hr tablet Take 12.5 mg by mouth 4 (four) times a week. Takes on Sunday, Monday, Wednesday and Friday      . omeprazole (PRILOSEC) 20 MG capsule Take 20 mg by mouth daily.      . Oxycodone HCl 10 MG TABS Take one tablet by mouth every eight hours for pain  90  tablet  0  . sodium chloride 0.9  % SOLN 250 mL with vancomycin 1000 MG SOLR 1,000 mg Inject 1,000 mg into the vein 3 (three) times a week. After dialysis      . temazepam (RESTORIL) 30 MG capsule Take 1 capsule (30 mg total) by mouth at bedtime.  30 capsule  5  . warfarin (COUMADIN) 5 MG tablet Take 5 mg by mouth daily.       No current facility-administered medications on file prior to visit.    No Known Allergies  Family History  Problem Relation Age of Onset  . Diabetes Mother   . Diabetes Maternal Grandmother   . Diabetes Paternal Grandmother   . Cancer Neg Hx     no FH of Colon Cancer   BP 126/76  Pulse 100  SpO2 97%  Review of Systems Denies LOC and weight change    Objective:   Physical Exam VITAL SIGNS:  See vs page GENERAL: no distress SKIN:  Insulin injection sites at the anterior abdomen are normal.   outside test results are reviewed: Glycohemoglobin=7.6    Assessment & Plan:  DM: This insulin regimen was chosen from multiple options, as it best matches his insulin to his changing requirements throughout the day.  The benefits of glycemic control must be weighed against the risks of hypoglycemia.   Renal failure: in this setting, he may not need basal insulin. BKA: this limits exercise rx of DM.

## 2013-03-27 ENCOUNTER — Encounter (HOSPITAL_COMMUNITY): Payer: Self-pay | Admitting: Orthopedic Surgery

## 2013-04-19 ENCOUNTER — Other Ambulatory Visit: Payer: Self-pay

## 2013-04-30 ENCOUNTER — Ambulatory Visit: Payer: 59 | Admitting: Endocrinology

## 2013-05-03 ENCOUNTER — Encounter (HOSPITAL_COMMUNITY): Payer: Self-pay | Admitting: *Deleted

## 2013-05-04 ENCOUNTER — Encounter (HOSPITAL_COMMUNITY): Payer: 59 | Admitting: Anesthesiology

## 2013-05-04 ENCOUNTER — Encounter (HOSPITAL_COMMUNITY)
Admission: RE | Disposition: A | Discharge status: Home / Self Care | Payer: Self-pay | Source: Ambulatory Visit | Attending: Orthopedic Surgery

## 2013-05-04 ENCOUNTER — Encounter (HOSPITAL_COMMUNITY): Payer: Self-pay | Admitting: *Deleted

## 2013-05-04 ENCOUNTER — Ambulatory Visit (HOSPITAL_COMMUNITY)
Admission: RE | Admit: 2013-05-04 | Discharge: 2013-05-04 | DRG: 463 | Disposition: A | Discharge status: Home / Self Care | Payer: 59 | Source: Ambulatory Visit | Attending: Orthopedic Surgery | Admitting: Orthopedic Surgery

## 2013-05-04 ENCOUNTER — Inpatient Hospital Stay (HOSPITAL_COMMUNITY): Payer: 59 | Admitting: Anesthesiology

## 2013-05-04 DIAGNOSIS — K219 Gastro-esophageal reflux disease without esophagitis: Secondary | ICD-10-CM | POA: Insufficient documentation

## 2013-05-04 DIAGNOSIS — I120 Hypertensive chronic kidney disease with stage 5 chronic kidney disease or end stage renal disease: Secondary | ICD-10-CM | POA: Insufficient documentation

## 2013-05-04 DIAGNOSIS — E119 Type 2 diabetes mellitus without complications: Secondary | ICD-10-CM | POA: Insufficient documentation

## 2013-05-04 DIAGNOSIS — J4489 Other specified chronic obstructive pulmonary disease: Secondary | ICD-10-CM | POA: Insufficient documentation

## 2013-05-04 DIAGNOSIS — T8131XA Disruption of external operation (surgical) wound, not elsewhere classified, initial encounter: Secondary | ICD-10-CM

## 2013-05-04 DIAGNOSIS — Z794 Long term (current) use of insulin: Secondary | ICD-10-CM | POA: Insufficient documentation

## 2013-05-04 DIAGNOSIS — E785 Hyperlipidemia, unspecified: Secondary | ICD-10-CM | POA: Insufficient documentation

## 2013-05-04 DIAGNOSIS — Y835 Amputation of limb(s) as the cause of abnormal reaction of the patient, or of later complication, without mention of misadventure at the time of the procedure: Secondary | ICD-10-CM | POA: Insufficient documentation

## 2013-05-04 DIAGNOSIS — J449 Chronic obstructive pulmonary disease, unspecified: Secondary | ICD-10-CM | POA: Insufficient documentation

## 2013-05-04 DIAGNOSIS — I4891 Unspecified atrial fibrillation: Secondary | ICD-10-CM | POA: Insufficient documentation

## 2013-05-04 DIAGNOSIS — S88119A Complete traumatic amputation at level between knee and ankle, unspecified lower leg, initial encounter: Secondary | ICD-10-CM | POA: Insufficient documentation

## 2013-05-04 DIAGNOSIS — I739 Peripheral vascular disease, unspecified: Secondary | ICD-10-CM | POA: Insufficient documentation

## 2013-05-04 DIAGNOSIS — N186 End stage renal disease: Secondary | ICD-10-CM | POA: Insufficient documentation

## 2013-05-04 DIAGNOSIS — I5022 Chronic systolic (congestive) heart failure: Secondary | ICD-10-CM | POA: Insufficient documentation

## 2013-05-04 DIAGNOSIS — Z992 Dependence on renal dialysis: Secondary | ICD-10-CM | POA: Insufficient documentation

## 2013-05-04 DIAGNOSIS — T8789 Other complications of amputation stump: Secondary | ICD-10-CM | POA: Insufficient documentation

## 2013-05-04 DIAGNOSIS — F17200 Nicotine dependence, unspecified, uncomplicated: Secondary | ICD-10-CM | POA: Insufficient documentation

## 2013-05-04 DIAGNOSIS — Z7901 Long term (current) use of anticoagulants: Secondary | ICD-10-CM | POA: Insufficient documentation

## 2013-05-04 HISTORY — DX: Peripheral vascular disease, unspecified: I73.9

## 2013-05-04 HISTORY — PX: I & D EXTREMITY: SHX5045

## 2013-05-04 LAB — BASIC METABOLIC PANEL
BUN: 35 mg/dL — ABNORMAL HIGH (ref 6–23)
CO2: 27 mEq/L (ref 19–32)
Calcium: 9.9 mg/dL (ref 8.4–10.5)
Chloride: 97 mEq/L (ref 96–112)
Creatinine, Ser: 5.65 mg/dL — ABNORMAL HIGH (ref 0.50–1.35)
GFR calc Af Amer: 11 mL/min — ABNORMAL LOW (ref >90)

## 2013-05-04 LAB — CBC
HCT: 35.9 % — ABNORMAL LOW (ref 39.0–52.0)
MCH: 26.2 pg (ref 26.0–34.0)
MCHC: 29.5 g/dL — ABNORMAL LOW (ref 30.0–36.0)
MCV: 88.6 fL (ref 78.0–100.0)
Platelets: 412 10*3/uL — ABNORMAL HIGH (ref 150–400)
RBC: 4.05 MIL/uL — ABNORMAL LOW (ref 4.22–5.81)
RDW: 17.8 % — ABNORMAL HIGH (ref 11.5–15.5)
WBC: 11.8 10*3/uL — ABNORMAL HIGH (ref 4.0–10.5)

## 2013-05-04 LAB — GLUCOSE, CAPILLARY
Glucose-Capillary: 152 mg/dL — ABNORMAL HIGH (ref 70–99)
Glucose-Capillary: 138 mg/dL — ABNORMAL HIGH (ref 70–99)
Glucose-Capillary: 129 mg/dL — ABNORMAL HIGH (ref 70–99)

## 2013-05-04 LAB — PROTIME-INR: Prothrombin Time: 20.9 seconds — ABNORMAL HIGH (ref 11.6–15.2)

## 2013-05-04 SURGERY — IRRIGATION AND DEBRIDEMENT EXTREMITY
Anesthesia: General | Site: Leg Lower | Laterality: Left | Wound class: Dirty or Infected

## 2013-05-04 MED ORDER — PHENYLEPHRINE HCL 10 MG/ML IJ SOLN
INTRAMUSCULAR | Status: DC | PRN
  Administered 2013-05-04 (×2): 80 ug via INTRAVENOUS

## 2013-05-04 MED ORDER — PROPOFOL 10 MG/ML IV BOLUS
INTRAVENOUS | Status: DC | PRN
  Administered 2013-05-04: 200 mg via INTRAVENOUS

## 2013-05-04 MED ORDER — METOPROLOL TARTRATE 12.5 MG HALF TABLET
ORAL_TABLET | ORAL | Status: AC
  Filled 2013-05-04: qty 1

## 2013-05-04 MED ORDER — SODIUM CHLORIDE 0.9 % IV SOLN
INTRAVENOUS | Status: DC
  Administered 2013-05-04 (×2): via INTRAVENOUS

## 2013-05-04 MED ORDER — ONDANSETRON HCL 4 MG/2ML IJ SOLN: 4.0000 mg | Freq: Once | INTRAMUSCULAR | Status: DC | PRN

## 2013-05-04 MED ORDER — HYDROMORPHONE HCL PF 1 MG/ML IJ SOLN: 0.2500 mg | INTRAMUSCULAR | Status: DC | PRN

## 2013-05-04 MED ORDER — LIDOCAINE HCL (CARDIAC) 20 MG/ML IV SOLN
INTRAVENOUS | Status: DC | PRN
  Administered 2013-05-04: 80 mg via INTRAVENOUS

## 2013-05-04 MED ORDER — CEFAZOLIN SODIUM-DEXTROSE 2-3 GM-% IV SOLR
INTRAVENOUS | Status: AC
  Administered 2013-05-04: 2 g via INTRAVENOUS
  Filled 2013-05-04: qty 50

## 2013-05-04 MED ORDER — SODIUM CHLORIDE 0.9 % IR SOLN
Status: DC | PRN
  Administered 2013-05-04: 1000 mL

## 2013-05-04 MED ORDER — FENTANYL CITRATE 0.05 MG/ML IJ SOLN
INTRAMUSCULAR | Status: DC | PRN
  Administered 2013-05-04 (×2): 100 ug via INTRAVENOUS

## 2013-05-04 SURGICAL SUPPLY — 53 items
BANDAGE GAUZE ELAST BULKY 4 IN (GAUZE/BANDAGES/DRESSINGS) ×1 IMPLANT
BLADE SURG 10 STRL SS (BLADE) ×2 IMPLANT
BNDG COHESIVE 4X5 TAN STRL (GAUZE/BANDAGES/DRESSINGS) ×2 IMPLANT
BNDG COHESIVE 6X5 TAN STRL LF (GAUZE/BANDAGES/DRESSINGS) ×4 IMPLANT
BNDG GAUZE STRTCH 6 (GAUZE/BANDAGES/DRESSINGS) ×6 IMPLANT
CANISTER SUCTION 2500CC (MISCELLANEOUS) ×1 IMPLANT
CLOTH BEACON ORANGE TIMEOUT ST (SAFETY) ×1 IMPLANT
COTTON STERILE ROLL (GAUZE/BANDAGES/DRESSINGS) ×2 IMPLANT
COVER MAYO STAND STRL (DRAPES) ×1 IMPLANT
COVER SURGICAL LIGHT HANDLE (MISCELLANEOUS) ×2 IMPLANT
CUFF TOURNIQUET SINGLE 18IN (TOURNIQUET CUFF) ×1 IMPLANT
CUFF TOURNIQUET SINGLE 24IN (TOURNIQUET CUFF) IMPLANT
CUFF TOURNIQUET SINGLE 34IN LL (TOURNIQUET CUFF) IMPLANT
CUFF TOURNIQUET SINGLE 44IN (TOURNIQUET CUFF) IMPLANT
DRAPE U-SHAPE 47X51 STRL (DRAPES) ×2 IMPLANT
DRSG ADAPTIC 3X8 NADH LF (GAUZE/BANDAGES/DRESSINGS) ×2 IMPLANT
DRSG PAD ABDOMINAL 8X10 ST (GAUZE/BANDAGES/DRESSINGS) ×1 IMPLANT
DURAPREP 26ML APPLICATOR (WOUND CARE) ×2 IMPLANT
ELECT CAUTERY BLADE 6.4 (BLADE) IMPLANT
ELECT REM PT RETURN 9FT ADLT (ELECTROSURGICAL)
ELECTRODE REM PT RTRN 9FT ADLT (ELECTROSURGICAL) IMPLANT
GLOVE BIOGEL PI IND STRL 6.5 (GLOVE) IMPLANT
GLOVE BIOGEL PI IND STRL 9 (GLOVE) ×1 IMPLANT
GLOVE BIOGEL PI INDICATOR 6.5 (GLOVE) ×1
GLOVE BIOGEL PI INDICATOR 9 (GLOVE) ×1
GLOVE ECLIPSE 6.0 STRL STRAW (GLOVE) ×1 IMPLANT
GLOVE ECLIPSE 6.5 STRL STRAW (GLOVE) ×1 IMPLANT
GLOVE SURG ORTHO 9.0 STRL STRW (GLOVE) ×2 IMPLANT
GOWN PREVENTION PLUS XLARGE (GOWN DISPOSABLE) ×1 IMPLANT
GOWN SRG XL XLNG 56XLVL 4 (GOWN DISPOSABLE) ×1 IMPLANT
GOWN STRL NON-REIN XL XLG LVL4 (GOWN DISPOSABLE) ×2
HANDPIECE INTERPULSE COAX TIP (DISPOSABLE)
KIT BASIN OR (CUSTOM PROCEDURE TRAY) ×2 IMPLANT
KIT ROOM TURNOVER OR (KITS) ×2 IMPLANT
MANIFOLD NEPTUNE II (INSTRUMENTS) ×2 IMPLANT
NS IRRIG 1000ML POUR BTL (IV SOLUTION) ×2 IMPLANT
PACK ORTHO EXTREMITY (CUSTOM PROCEDURE TRAY) ×2 IMPLANT
PAD ARMBOARD 7.5X6 YLW CONV (MISCELLANEOUS) ×4 IMPLANT
PADDING CAST COTTON 6X4 STRL (CAST SUPPLIES) ×2 IMPLANT
SET HNDPC FAN SPRY TIP SCT (DISPOSABLE) IMPLANT
SPONGE GAUZE 4X4 12PLY (GAUZE/BANDAGES/DRESSINGS) ×2 IMPLANT
SPONGE LAP 18X18 X RAY DECT (DISPOSABLE) ×3 IMPLANT
STOCKINETTE IMPERVIOUS 9X36 MD (GAUZE/BANDAGES/DRESSINGS) ×1 IMPLANT
SUT ETHILON 2 0 PSLX (SUTURE) ×2 IMPLANT
SUT SILK 2 0 (SUTURE) ×2
SUT SILK 2-0 18XBRD TIE 12 (SUTURE) IMPLANT
TOWEL OR 17X24 6PK STRL BLUE (TOWEL DISPOSABLE) ×2 IMPLANT
TOWEL OR 17X26 10 PK STRL BLUE (TOWEL DISPOSABLE) ×2 IMPLANT
TUBE ANAEROBIC SPECIMEN COL (MISCELLANEOUS) IMPLANT
TUBE CONNECTING 12X1/4 (SUCTIONS) ×2 IMPLANT
UNDERPAD 30X30 INCONTINENT (UNDERPADS AND DIAPERS) ×2 IMPLANT
WATER STERILE IRR 1000ML POUR (IV SOLUTION) ×1 IMPLANT
YANKAUER SUCT BULB TIP NO VENT (SUCTIONS) ×2 IMPLANT

## 2013-05-04 NOTE — Op Note (Signed)
OPERATIVE REPORT  DATE OF SURGERY: 05/04/2013  PATIENT:  Rosanne Sack,  62 y.o. male  PRE-OPERATIVE DIAGNOSIS:  Left BKA Dehiscence  POST-OPERATIVE DIAGNOSIS:  Left Below knee amputation Dehiscence  PROCEDURE:  Procedure(s): Irrigation and Debridement Left Below Knee Ampuation Excision skin soft tissue muscle and bone local tissue rearrangement of the wound, wound size 20 x 10 cm  SURGEON:  Surgeon(s): Nadara Mustard, MD  ANESTHESIA:   general  EBL:  min ML  SPECIMEN:  No Specimen  TOURNIQUET:  * No tourniquets in log *  PROCEDURE DETAILS: Patient is a 62 year old gentleman end stage renal disease dialysis diabetes peripheral vascular disease status post transtibial amputation on the left. Patient has had dehiscence of the lateral aspect of the wound and presents at this time for irrigation debridement excision of skin soft tissue muscle and bone. Risks and benefits were discussed including persistent infection nonhealing of the wound need for above-the-knee amputation. Patient states he understands was pursued this time. Description of procedure patient was brought to the operating room and underwent a general anesthetic. After adequate levels and anesthesia obtained patient's left lower extremity was prepped using DuraPrep draped into a sterile field. A fishmouth incision was made around the necrotic wound this carried down to bone possibly a centimeter the distal fibula was excised hemostasis was obtained with 2-0 silk ties. The wound is irrigated with normal saline. The incision was closed using 2-0 nylon with local tissue rearrangement a lateral wound 20 x 10 cm. The wound was covered with Adaptic orthopedic sponges AB dressing Kerlix and Coban. Patient was extubated taken to the PACU in stable condition.  PLAN OF CARE: Discharge to home after PACU  PATIENT DISPOSITION:  PACU - hemodynamically stable.   Nadara Mustard, MD 05/04/2013 6:48 PM

## 2013-05-04 NOTE — Anesthesia Postprocedure Evaluation (Signed)
Anesthesia Post Note  Patient: [name]  Procedure(s) Performed: Procedure(s) (LRB): Irrigation and Debridement Left Below Knee Ampuation (Left)  Anesthesia type: General  Patient location: PACU  Post pain: Pain level controlled and Adequate analgesia  Post assessment: Post-op Vital signs reviewed, Patient's Cardiovascular Status Stable, Respiratory Function Stable, Patent Airway and Pain level controlled  Last Vitals:  Filed Vitals:   05/04/13 1915  BP: 149/88  Pulse: 66  Temp:   Resp: 15    Post vital signs: Reviewed and stable  Level of consciousness: awake, alert  and oriented  Complications: No apparent anesthesia complications

## 2013-05-04 NOTE — Addendum Note (Signed)
Addendum created 05/04/13 2058 by Jodell Cipro, CRNA   Modules edited: Anesthesia Medication Administration

## 2013-05-04 NOTE — H&P (Signed)
[name] is an 62 y.o. male.   Chief Complaint: Dehiscence left transtibial amputation HPI: Patient is a 62 year old gentleman status post revision transtibial amputation he is end stage renal disease on dialysis with diabetes. Patient has had progressive dehiscence of the lateral aspect of the incision.  Past Medical History  Diagnosis Date  . Chronic kidney disease     dialysis t,th,sat  . GERD (gastroesophageal reflux disease)   . Hypertension   . Neuromuscular disorder     neurophathy  . Atrial fibrillation     chronically anticoagulated with coumadin  . Hyperlipidemia   . Atrial flutter 07/07/2009    s/p ablation  . DIABETES MELLITUS, TYPE II 08/01/2008  . GOUT 08/01/2008  . ERECTILE DYSFUNCTION 08/01/2008  . PERIPHERAL NEUROPATHY 08/01/2008  . CORONARY ARTERY DISEASE 08/01/2008    non obstructive per cath 1/10; Dr. Juanda Chance  . 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 Gastroparesis 08/01/2008  . End stage renal failure on dialysis 08/01/2008  . NEPHROLITHIASIS, HX OF 08/01/2008  . Chronic systolic heart failure 06/21/2010  . COPD 07/21/2010  . Diverticulosis of colon (without mention of hemorrhage) 10/28/2010  . Benign neoplasm of colon 10/28/2010  . Hemorrhoids, internal 10/28/2010  . Renal cyst     bilateral  . Second degree Mobitz I AV block     asymptomatic  . Cyst     infected cyst on back   . Muscle spasm of back     back muscle spasms and pt is unable to lay flat   . Abscess of back     lower middle portion of back  . Headache(784.0)     HX; of migraines 25-30 years ago  . Cancer     bBasal cell on nose  . Peripheral vascular disease     poor circulation    Past Surgical History  Procedure Laterality Date  . Atrial ablation surgery  07-2008, 07/08/09    Dr Johney Frame  . Right foot  09-2009    infection  . Colonoscopy  70yrs ago    Dr.Orr  . Av fistula placement      left upper arm  . S/p right arm surgery after fracture    . Pilonidal cyst  excision      s/p  . Cataract extraction      Hx: right eye  . Tonsillectomy    . I&d extremity Left 12/29/2012    Procedure: IRRIGATION AND DEBRIDEMENT EXTREMITY;  Surgeon: Nadara Mustard, MD;  Location: [facility];  Service: Orthopedics;  Laterality: Left;  Irrigation and Debridement Left Leg, VAC, Theraskin  . Toe amputation Right 01/18/2013    Dr [name]  . I&d extremity Bilateral 01/18/2013    Procedure: IRRIGATION AND DEBRIDEMENT EXTREMITYLEFT LEG AND RIGHT FOOT;  Surgeon: Nadara Mustard, MD;  Location: [facility];  Service: Orthopedics;  Laterality: Bilateral;  Left Leg Excisional Debridement  . Skin graft Left     left leg within the past month  . Amputation Left 02/02/2013    Procedure: AMPUTATION BELOW KNEE;  Surgeon: Nadara Mustard, MD;  Location: [facility] OR;  Service: Orthopedics;  Laterality: Left;  Left Below Knee Amputation  . Fracture surgery    . Vasectomy    . Amputation Left 03/23/2013    Procedure: REVISION OF BELOW THE KNEE  AMPUTATION ;  Surgeon: Nadara Mustard, MD;  Location: [facility] OR;  Service: Orthopedics;  Laterality: Left;  Left Below Knee Amputation Revision  Family History  Problem Relation Age of Onset  . Diabetes Mother   . Diabetes Maternal Grandmother   . Diabetes Paternal Grandmother   . Cancer Neg Hx     no FH of Colon Cancer   Social History:  reports that he has been smoking Cigarettes.  He has a 10 pack-year smoking history. He has never used smokeless tobacco. He reports that he does not drink alcohol or use illicit drugs.  Allergies: No Known Allergies  No prescriptions prior to admission    No results found for this or any previous visit (from the past 48 hour(s)). No results found.  Review of Systems  All other systems reviewed and are negative.    There were no vitals taken for this visit. Physical Exam  Assessment patient has no cellulitis no drainage no signs of infection there is necrotic tissue deep within the wound.   Assessment/Plan Assessment:  Dehiscence lateral left transtibial amputation.  Plan: Will plan for excisional debridement of skin soft tissue muscle and wound closure anticipate discharge to home risks and benefits were discussed patient states he understands and wished to proceed at this time.  DUDA,MARCUS V 05/04/2013, 6:14 AM

## 2013-05-04 NOTE — Transfer of Care (Signed)
Immediate Anesthesia Transfer of Care Note  Patient: [name]  Procedure(s) Performed: Procedure(s) with comments: Irrigation and Debridement Left Below Knee Ampuation (Left) - Irrigation and Debridement Left Below Knee Ampuation  Patient Location: PACU  Anesthesia Type:General  Level of Consciousness: awake, alert  and oriented  Airway & Oxygen Therapy: Patient Spontanous Breathing and Patient connected to nasal cannula oxygen  Post-op Assessment: Report given to PACU RN, Post -op Vital signs reviewed and stable and Patient moving all extremities X 4  Post vital signs: Reviewed and stable  Complications: No apparent anesthesia complications

## 2013-05-04 NOTE — Anesthesia Preprocedure Evaluation (Signed)
Anesthesia Evaluation  Patient identified by MRN, date of birth, ID band Patient awake    Reviewed: Allergy & Precautions, H&P , NPO status , Patient's Chart, lab work & pertinent test results  Airway       Dental   Pulmonary Current Smoker,          Cardiovascular hypertension, + CAD and + Peripheral Vascular Disease + dysrhythmias     Neuro/Psych  Headaches,  Neuromuscular disease    GI/Hepatic GERD-  ,  Endo/Other  diabetes, Type 2  Renal/GU Renal disease     Musculoskeletal   Abdominal   Peds  Hematology  (+) Blood dyscrasia, ,   Anesthesia Other Findings   Reproductive/Obstetrics                           Anesthesia Physical Anesthesia Plan  ASA: III  Anesthesia Plan: General   Post-op Pain Management:    Induction: Intravenous  Airway Management Planned: LMA  Additional Equipment:   Intra-op Plan:   Post-operative Plan: Extubation in OR  Informed Consent: I have reviewed the patients History and Physical, chart, labs and discussed the procedure including the risks, benefits and alternatives for the proposed anesthesia with the patient or authorized representative who has indicated his/her understanding and acceptance.     Plan Discussed with: CRNA, Anesthesiologist and Surgeon  Anesthesia Plan Comments:         Anesthesia Quick Evaluation

## 2013-05-04 NOTE — Anesthesia Procedure Notes (Signed)
Procedure Name: LMA Insertion Date/Time: 05/04/2013 6:00 PM Performed by: Orvilla Fus A Pre-anesthesia Checklist: Patient identified, Emergency Drugs available, Suction available and Patient being monitored Patient Re-evaluated:Patient Re-evaluated prior to inductionOxygen Delivery Method: Circle system utilized Preoxygenation: Pre-oxygenation with 100% oxygen Intubation Type: IV induction LMA: LMA inserted LMA Size: 5.0 Tube type: Oral Number of attempts: 1 Placement Confirmation: positive ETCO2 and breath sounds checked- equal and bilateral Tube secured with: Tape Dental Injury: Teeth and Oropharynx as per pre-operative assessment

## 2013-05-06 NOTE — Discharge Summary (Signed)
Physician Discharge Summary  Patient ID: [name] MRN: [id number] DOB/AGE: 12/02/1950 62 y.o.  Admit date: 05/04/2013 Discharge date: 05/06/2013  Admission Diagnoses: Dehiscence left transtibial amputation  Discharge Diagnoses: Dehiscence left transtibial amputation Active Problems:   * No active hospital problems. *   Discharged Condition: stable  Hospital Course: Patient underwent outpatient surgery for irrigation debridement excision skin soft tissue muscle and bone from left transtibial amputation with local tissue rearrangement for wound closure  Consults: None  Significant Diagnostic Studies: labs: Routine labs  Treatments: surgery: See operative note  Discharge Exam: Blood pressure 136/88, pulse 91, temperature 98.2 F (36.8 C), temperature source Oral, resp. rate 14, height 6' (1.829 m), weight 106.595 kg (235 lb), SpO2 96.00%. Incision/Wound: clean and dry  Disposition: 01-Home or Self Care  Discharge Orders   Future Orders Complete By Expires   Call MD / Call 911  As directed    Comments:     If you experience chest pain or shortness of breath, CALL 911 and be transported to the hospital emergency room.  If you develope a fever above 101 F, pus (white drainage) or increased drainage or redness at the wound, or calf pain, call your surgeon's office.   Constipation Prevention  As directed    Comments:     Drink plenty of fluids.  Prune juice may be helpful.  You may use a stool softener, such as Colace (over the counter) 100 mg twice a day.  Use MiraLax (over the counter) for constipation as needed.   Diet - low sodium heart healthy  As directed    Increase activity slowly as tolerated  As directed        Medication List         allopurinol 100 MG tablet  Commonly known as:  ZYLOPRIM  Take 100 mg by mouth daily.     calcium acetate 667 MG capsule  Commonly known as:  PHOSLO  Take 667 mg by mouth 3 (three) times daily with meals.     DIALYVITE PO   Take 100 mg by mouth daily.     doxycycline 100 MG tablet  Commonly known as:  VIBRA-TABS  Take 100 mg by mouth daily. For 1 month. Started 03/07/13     ethyl chloride spray  Apply 1 application topically daily as needed. For pain     FOSRENOL 1000 MG chewable tablet  Generic drug:  lanthanum  Chew 2 tablets by mouth 3 (three) times daily with meals.     gabapentin 300 MG capsule  Commonly known as:  NEURONTIN  Take 300 mg by mouth 3 (three) times daily.     HYDROcodone-acetaminophen 5-325 MG per tablet  Commonly known as:  NORCO/VICODIN  Take 1 tablet by mouth every 4 (four) hours as needed for pain.     insulin aspart 100 UNIT/ML injection  Commonly known as:  novoLOG  Inject 20 Units into the skin 3 (three) times daily before meals.     insulin glargine 100 UNIT/ML injection  Commonly known as:  LANTUS  Inject 25 Units into the skin at bedtime.     loperamide 2 MG capsule  Commonly known as:  IMODIUM  Take 2 mg by mouth 4 (four) times daily as needed for diarrhea or loose stools.     metoprolol succinate 25 MG 24 hr tablet  Commonly known as:  TOPROL-XL  Take 12.5 mg by mouth 4 (four) times a week. Takes on Sunday, Monday, Wednesday and Friday  omeprazole 20 MG capsule  Commonly known as:  PRILOSEC  Take 20 mg by mouth daily.     sodium chloride 0.9 % SOLN 250 mL with vancomycin 1000 MG SOLR 1,000 mg  Inject 1,000 mg into the vein 3 (three) times a week. After dialysis     temazepam 30 MG capsule  Commonly known as:  RESTORIL  Take 30 mg by mouth at bedtime.     warfarin 6 MG tablet  Commonly known as:  COUMADIN  Take 6 mg by mouth daily.           Follow-up Information   Follow up with DUDA,MARCUS V, MD In 2 weeks.   Specialty:  Orthopedic Surgery   Contact information:   [address] [place] [address] [phone]       Signed: Nadara Mustard 05/06/2013, 1:16 PM

## 2013-05-08 ENCOUNTER — Encounter (HOSPITAL_COMMUNITY): Payer: Self-pay | Admitting: Orthopedic Surgery

## 2013-05-08 LAB — PROTIME-INR: INR: 2.8 — AB (ref 0.9–1.1)

## 2013-05-09 ENCOUNTER — Encounter: Payer: Self-pay | Admitting: Adult Health

## 2013-05-09 ENCOUNTER — Ambulatory Visit (INDEPENDENT_AMBULATORY_CARE_PROVIDER_SITE_OTHER): Payer: 59 | Admitting: Cardiovascular Disease

## 2013-05-09 DIAGNOSIS — N186 End stage renal disease: Secondary | ICD-10-CM | POA: Insufficient documentation

## 2013-05-09 DIAGNOSIS — Z7901 Long term (current) use of anticoagulants: Secondary | ICD-10-CM

## 2013-05-09 DIAGNOSIS — G4700 Insomnia, unspecified: Secondary | ICD-10-CM | POA: Insufficient documentation

## 2013-05-09 DIAGNOSIS — I4891 Unspecified atrial fibrillation: Secondary | ICD-10-CM

## 2013-05-09 NOTE — Progress Notes (Signed)
Patient ID: [name], male   DOB: 01/02/1951, 62 y.o.   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 He has been admitted for a short-term rehabilitation.  REASSESSMENT OF ONGOING PROBLEM(S):  PERIPHERAL NEUROPATHY: The peripheral neuropathy is stable. The patient denies pain in the feet, tingling, and numbness. No complications noted from the medication presently being used.  INSOMNIA: The insomnia remains stable.  No complications noted from the medications presently being used. Patient denies ongoing insomnia, pain, hallucinations, delusions.  ATRIAL FIBRILLATION: the patients atrial fibrillation remains stable.  The patient denies DOE, tachycardia, orthopnea, transient neurological sx, pedal edema, palpitations, & PNDs.  No complications noted from the medications currently being used.  PAST MEDICAL HISTORY : Reviewed.  No changes.  CURRENT MEDICATIONS: Reviewed per [facility]  REVIEW OF SYSTEMS:  GENERAL: no change in appetite, no fatigue, no weight changes, no fever, chills or weakness RESPIRATORY: no cough, SOB, DOE, wheezing, hemoptysis CARDIAC: no chest pain, edema or palpitations GI: no abdominal pain, diarrhea, constipation, heart burn, nausea or vomiting  PHYSICAL EXAMINATION  VS:  T99.4       P99      RR16      BP131/69     POX96 %     WT238.2 (Lb)  GENERAL: no acute distress, normal body habitus EYES: conjunctivae normal, sclerae normal, normal eye lids NECK: supple, trachea midline, no neck masses, no thyroid tenderness, no thyromegaly LYMPHATICS: no LAN in the neck, no supraclavicular  LAN RESPIRATORY: breathing is even & unlabored, BS CTAB CARDIAC: RRR, no murmur,no extra heart sounds, no edema GI: abdomen soft, normal BS, no masses, no tenderness, no hepatomegaly, no splenomegaly EXTREMITIES: Left BKA PSYCHIATRIC: the patient is alert & oriented to person, affect & behavior appropriate  LABS/RADIOLOGY: 02/03/13 WBC 10.0 hemoglobin 8.3 hematocrit 37.5 sodium 138 potassium 4.2 glucose 216 BUN 38 creatinine 6.66 calcium 9.3 phosphorous 6.6 albumin 3.0 12/21/12 hemoglobin A1c 8.7  ASSESSMENT/PLAN:  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

## 2013-05-09 NOTE — Progress Notes (Signed)
Patient ID: FRANKLIN BAUMBACH, male   DOB: [birth year], 62 y.o.   MRN: [id number] Subjective:     Indication: atrial fibrillation Bleeding signs/symptoms: None Thromboembolic signs/symptoms: None  Missed Coumadin doses: None Medication changes: no Dietary changes: no Bacterial/viral infection: yes - on antibiotic for left stump infection Other concerns: no  Review of Systems A comprehensive review of systems was negative.   Objective:    INR Today: 1.9 Current dose: Coumadin 5 mg PO Q M-W-F-Sat_Sun and Coumadin 2.5 mg PO Q T-Th     Assessment:    Subtherapeutic INR for goal of 2-3   Plan:    1. New dose: increase Coumadin 5 mg PO Q M-Sunday_Tues-Wed_Thurs-Fri-Sat and Coumadin 2.5 mg PO Q Sun   2. Next INR: 03/20/13

## 2013-05-09 NOTE — Progress Notes (Signed)
Patient ID: [name], male   DOB: [birth year], 62 y.o.   MRN: [id number]                  PROGRESS NOTE  DATE: 03/07/13  FACILITY: Nursing Home Location: [facility]  LEVEL OF CARE: SNF (31)  Acute Visit  CHIEF COMPLAINT:  [place] Diabetic Osteomyelitis  HISTORY OF PRESENT ILLNESS: This is a 62 year old male who dad a recent left BKA due to Osteomyelitis ulceration gangrene of left foot. Doxycycline was recently discontinued. It was noted that his stump has increased in serosanguinous drainage. Latest wbc 11.1 - elevated.  PAST MEDICAL HISTORY : Reviewed.  No changes.  CURRENT MEDICATIONS: Reviewed per [facility]  REVIEW OF SYSTEMS:  GENERAL: no change in appetite, no fatigue, no weight changes, no fever, chills or weakness RESPIRATORY: no cough, SOB, DOE, wheezing, hemoptysis CARDIAC: no chest pain, edema or palpitations GI: no abdominal pain, diarrhea, constipation, heart burn, nausea or vomiting  PHYSICAL EXAMINATION  VS:  T99.4       P99      RR16      BP131/69     POX96 %     WT238.2 (Lb)  GENERAL: no acute distress, normal body habitus NECK: supple, trachea midline, no neck masses, no thyroid tenderness, no thyromegaly LYMPHATICS: no LAN in the neck, no supraclavicular LAN RESPIRATORY: breathing is even & unlabored, BS CTAB CARDIAC: RRR, no murmur,no extra heart sounds, no edema GI: abdomen soft, normal BS, no masses, no tenderness, no hepatomegaly, no splenomegaly EXTREMITIES: Left BKA stump has moderate serosanguinous drainage PSYCHIATRIC: the patient is alert & oriented to person, affect & behavior appropriate  LABS/RADIOLOGY: 02/03/13 WBC 10.0 hemoglobin 8.3 hematocrit 37.5 sodium 138 potassium 4.2 glucose 216 BUN 38 creatinine 6.66 calcium 9.3 phosphorous 6.6 albumin 3.0 12/21/12 hemoglobin A1c 8.7  ASSESSMENT/PLAN:  Diabetic Osteomyelitis -  Start Doxycycline 100 mg 1 tab PO BID x 2 weeks; draw CBC before start of antibiotic   CPT CODE: 08657

## 2013-05-09 NOTE — Progress Notes (Signed)
Patient ID: [name], male   DOB: 11/09/1950, 62 y.o.   MRN: [id number] Subjective:     Indication: atrial fibrillation Bleeding signs/symptoms: None Thromboembolic signs/symptoms: None  Missed Coumadin doses: None Medication changes: no Dietary changes: no Bacterial/viral infection: on antibiotic for osteomyelitis Other concerns: no    Review of Systems A comprehensive review of systems was negative.   Objective:    INR Today: 1.8 Current dose: Coumadin 5 mg Q Sun-Mon-Wed-Friday and Coumadin 2.5 mg Q T-Th-Sat     Assessment:    Subtherapeutic INR for goal of 2-3   Plan:    1. New dose: increase Coumadin 5 mg PO Q Sun-Sat-M-W-F and Coumadin 2.5 mg PO Q T-Th   2. Next INR: 03/06/13

## 2013-05-15 ENCOUNTER — Non-Acute Institutional Stay (SKILLED_NURSING_FACILITY): Payer: 59 | Admitting: Adult Health

## 2013-05-15 DIAGNOSIS — T8131XA Disruption of external operation (surgical) wound, not elsewhere classified, initial encounter: Secondary | ICD-10-CM

## 2013-05-15 DIAGNOSIS — I10 Essential (primary) hypertension: Secondary | ICD-10-CM

## 2013-05-15 DIAGNOSIS — M109 Gout, unspecified: Secondary | ICD-10-CM

## 2013-05-15 DIAGNOSIS — G4700 Insomnia, unspecified: Secondary | ICD-10-CM

## 2013-05-15 DIAGNOSIS — I4891 Unspecified atrial fibrillation: Secondary | ICD-10-CM

## 2013-05-15 DIAGNOSIS — E1059 Type 1 diabetes mellitus with other circulatory complications: Secondary | ICD-10-CM

## 2013-05-15 DIAGNOSIS — T8781 Dehiscence of amputation stump: Secondary | ICD-10-CM

## 2013-05-15 DIAGNOSIS — M869 Osteomyelitis, unspecified: Secondary | ICD-10-CM

## 2013-05-15 DIAGNOSIS — Z7901 Long term (current) use of anticoagulants: Secondary | ICD-10-CM

## 2013-05-15 DIAGNOSIS — N186 End stage renal disease: Secondary | ICD-10-CM

## 2013-05-15 DIAGNOSIS — K219 Gastro-esophageal reflux disease without esophagitis: Secondary | ICD-10-CM

## 2013-05-15 DIAGNOSIS — G609 Hereditary and idiopathic neuropathy, unspecified: Secondary | ICD-10-CM

## 2013-05-16 ENCOUNTER — Non-Acute Institutional Stay (SKILLED_NURSING_FACILITY): Payer: 59 | Admitting: Adult Health

## 2013-05-16 DIAGNOSIS — I4891 Unspecified atrial fibrillation: Secondary | ICD-10-CM

## 2013-05-16 DIAGNOSIS — Z7901 Long term (current) use of anticoagulants: Secondary | ICD-10-CM

## 2013-05-17 ENCOUNTER — Encounter: Payer: Self-pay | Admitting: Adult Health

## 2013-05-17 DIAGNOSIS — I739 Peripheral vascular disease, unspecified: Secondary | ICD-10-CM

## 2013-05-17 DIAGNOSIS — Z4789 Encounter for other orthopedic aftercare: Secondary | ICD-10-CM

## 2013-05-17 DIAGNOSIS — T8781 Dehiscence of amputation stump: Secondary | ICD-10-CM | POA: Insufficient documentation

## 2013-05-17 DIAGNOSIS — T8189XA Other complications of procedures, not elsewhere classified, initial encounter: Secondary | ICD-10-CM

## 2013-05-17 DIAGNOSIS — E119 Type 2 diabetes mellitus without complications: Secondary | ICD-10-CM

## 2013-05-17 NOTE — Progress Notes (Signed)
Patient ID: [name], male   DOB: Sep 27, 1950, 62 y.o.   MRN: [id number]              PROGRESS NOTE  DATE: 05/15/2013   FACILITY: [facility]  LEVEL OF CARE: SNF (31)  Acute Visit  CHIEF COMPLAINT:  Discharge Notes  HISTORY OF PRESENT ILLNESS: This is a 62 year old male who is for discharge home with Home health PT, OT and Nursing. DME: 18" X 16" wheelchair with anti-tippers, lightweight, swing-away desk length arm rests, elevating swing-away leg rests, standard cushion. [facility] wound care protocol. 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 He was recently readmitted to [facility] on 05/06/13 from [facility] due to wound dehiscence of left lateral transtibial amputation S/P irrigation debridement. Patient was admitted to this facility for short-term rehabilitation after the patient's recent hospitalization.  Patient has completed SNF rehabilitation and therapy has cleared the patient for discharge.  Reassessment of ongoing problem(s):  HTN: Pt 's HTN remains stable.  Denies CP, sob, DOE, pedal edema, headaches, dizziness or visual disturbances.  No complications from the medications currently being used.  Last BP : 116/56  DM:pt's DM remains stable.  Pt denies polyuria, polydipsia, polyphagia, changes in vision or hypoglycemic episodes.  No complications noted from the medication presently being used.  9/14 hemoglobin A1c 7.6  ATRIAL FIBRILLATION: the patients atrial fibrillation remains stable.  The patient denies DOE, tachycardia, orthopnea, transient neurological sx, pedal edema, palpitations, & PNDs.  No complications noted from the medications currently being used.  GERD: pt's GERD is stable.  Denies ongoing heartburn, abd. Pain, nausea or vomiting.  Currently on a PPI & tolerates it without any adverse  reactions.   PAST MEDICAL HISTORY : Reviewed.  No changes.  CURRENT MEDICATIONS: Reviewed per [facility]  REVIEW OF SYSTEMS:  GENERAL: no change in appetite, no fatigue, no weight changes, no fever, chills or weakness RESPIRATORY: no cough, SOB, DOE, wheezing, hemoptysis CARDIAC: no chest pain, edema or palpitations GI: no abdominal pain, diarrhea, constipation, heart burn, nausea or vomiting  PHYSICAL EXAMINATION  VS:  T98       P86       RR18      BP116/56      POX97 %       WT234 (Lb)  GENERAL: no acute distress, normal body habitus EYES: conjunctivae normal, sclerae normal, normal eye lids NECK: supple, trachea midline, no neck masses, no thyroid tenderness, no thyromegaly LYMPHATICS: no LAN in the neck, no supraclavicular LAN RESPIRATORY: breathing is even & unlabored, BS CTAB CARDIAC: heart rate is irregularly irregular, no murmur,no extra heart sounds, no edema GI: abdomen soft, normal BS, no masses, no tenderness, no hepatomegaly, no splenomegaly PSYCHIATRIC: the patient is alert & oriented to person, affect & behavior appropriate  LABS/RADIOLOGY: 03/14/13 WBC 13.0 hemoglobin 9.1 hematocrit 32.5 9-14 hemoglobin 8.5, MCV 95.1, platelets 518, WBC 10.5, glucose 267, BUN 29, creatinine 5 otherwise BMP normal  glycohemoglobin 7.6 8-14 creatinine 7.2, CO2 21, phosphorus 7.1, albumin 3.4, glucose 136, serum iron 85, TIBC 177, percent saturation 14     ASSESSMENT/PLAN:  Wound dehiscence status post irrigation and debridement - for [facility] wound protocol  Osteomyelitis - continue doxycycline  Gout - continue on allopurinol  A. Fib - rate controlled  Long-term use of anticoagulants - INR 5.0 supratherapeutic and stump  is mildly bleeding; give Vitamin K 5 mg PO x1 and hold Coumadin; check INR 05/16/13  Diabetes mellitus, type I - well controlled; continue Aspart Insulin  GERD - continue Prilosec  Peripheral Neuropathy - continue Neurontin  Insomnia - continue  Restoril  ESRD on hemodialysis thru Left AVF - continue   Hypertension - well controlled  I have filled out patient's discharge paperwork and written prescriptions.  Patient will receive home health PT, OT and Nursing.  DME provided:  18" X 16" wheelchair with anti-tippers, lightweight, swing-away desk length arm rests, elevating swing-away leg rests, standard cushion.DME form completed.  Total discharge time: Greater than 30 minutes Discharge time involved coordination of the discharge process with social worker, nursing staff and therapy department. Medical justification for home health services/DME verified.  CPT CODE: 16109

## 2013-05-17 NOTE — Progress Notes (Signed)
Patient ID: [name], male   DOB: [birth year], 62 y.o.   MRN: [id number] Subjective:     Indication: atrial fibrillation Bleeding signs/symptoms: No Thromboembolic signs/symptoms: None  Missed Coumadin doses: x 1 day due to supratherapeutic INR 5.0 Medication changes: no Dietary changes: no Bacterial/viral infection: currently on antibiotic for Osteomyelitis Other concerns: no  The following portions of the patient's history were reviewed and updated as appropriate: allergies, current medications, past family history, past medical history, past social history, past surgical history and problem list.  Review of Systems A comprehensive review of systems was negative.   Objective:    INR Today: 2.1 Current dose:  held x 1 day  Assessment:    Therapeutic INR for goal of 2-3   Plan:    1. New dose: decrease Coumadin to 5 mg PO Q D   2. Next INR: 05/21/13

## 2013-05-28 ENCOUNTER — Encounter (HOSPITAL_COMMUNITY): Payer: Self-pay | Admitting: Respiratory Therapy

## 2013-05-28 ENCOUNTER — Other Ambulatory Visit (HOSPITAL_COMMUNITY): Payer: Self-pay | Admitting: Orthopedic Surgery

## 2013-05-29 ENCOUNTER — Encounter (HOSPITAL_COMMUNITY): Payer: Self-pay | Admitting: *Deleted

## 2013-05-29 ENCOUNTER — Other Ambulatory Visit (HOSPITAL_COMMUNITY): Payer: Self-pay | Admitting: Orthopedic Surgery

## 2013-05-29 MED ORDER — CEFAZOLIN SODIUM-DEXTROSE 2-3 GM-% IV SOLR
2.0000 g | INTRAVENOUS | Status: AC
  Administered 2013-05-30: 2 g via INTRAVENOUS
  Filled 2013-05-29: qty 50

## 2013-05-29 NOTE — Progress Notes (Signed)
I spoke with Elnita Maxwell at Dr Audrie Lia office, she said Dr [name] would instruct patient to stop Coumadin if he wanted him to stop it.

## 2013-05-29 NOTE — Progress Notes (Addendum)
I spoke with patient and notified him of OR time change to 10:30, arrival time 8:00.

## 2013-05-29 NOTE — Progress Notes (Signed)
[name] reported that he is still taking Coumadin.  I left a message with [name] at Dr Audrie Lia office (voice mail).

## 2013-05-30 ENCOUNTER — Inpatient Hospital Stay (HOSPITAL_COMMUNITY): Payer: 59 | Admitting: Anesthesiology

## 2013-05-30 ENCOUNTER — Encounter (HOSPITAL_COMMUNITY): Payer: 59 | Admitting: Anesthesiology

## 2013-05-30 ENCOUNTER — Encounter (HOSPITAL_COMMUNITY): Payer: Self-pay | Admitting: *Deleted

## 2013-05-30 ENCOUNTER — Encounter (HOSPITAL_COMMUNITY)
Admission: RE | Disposition: A | Discharge status: Home / Self Care | Payer: Self-pay | Source: Ambulatory Visit | Attending: Orthopedic Surgery

## 2013-05-30 ENCOUNTER — Inpatient Hospital Stay (HOSPITAL_COMMUNITY)
Admission: RE | Admit: 2013-05-30 | Discharge: 2013-06-01 | DRG: 474 | Disposition: A | Discharge status: Home / Self Care | Payer: 59 | Source: Ambulatory Visit | Attending: Orthopedic Surgery | Admitting: Orthopedic Surgery

## 2013-05-30 DIAGNOSIS — Z79899 Other long term (current) drug therapy: Secondary | ICD-10-CM

## 2013-05-30 DIAGNOSIS — I5022 Chronic systolic (congestive) heart failure: Secondary | ICD-10-CM | POA: Diagnosis present

## 2013-05-30 DIAGNOSIS — J449 Chronic obstructive pulmonary disease, unspecified: Secondary | ICD-10-CM | POA: Diagnosis present

## 2013-05-30 DIAGNOSIS — K219 Gastro-esophageal reflux disease without esophagitis: Secondary | ICD-10-CM | POA: Diagnosis present

## 2013-05-30 DIAGNOSIS — N2581 Secondary hyperparathyroidism of renal origin: Secondary | ICD-10-CM | POA: Diagnosis present

## 2013-05-30 DIAGNOSIS — D631 Anemia in chronic kidney disease: Secondary | ICD-10-CM | POA: Diagnosis present

## 2013-05-30 DIAGNOSIS — Z87442 Personal history of urinary calculi: Secondary | ICD-10-CM

## 2013-05-30 DIAGNOSIS — Z9849 Cataract extraction status, unspecified eye: Secondary | ICD-10-CM

## 2013-05-30 DIAGNOSIS — I2510 Atherosclerotic heart disease of native coronary artery without angina pectoris: Secondary | ICD-10-CM | POA: Diagnosis present

## 2013-05-30 DIAGNOSIS — Y92009 Unspecified place in unspecified non-institutional (private) residence as the place of occurrence of the external cause: Secondary | ICD-10-CM

## 2013-05-30 DIAGNOSIS — T8789 Other complications of amputation stump: Principal | ICD-10-CM | POA: Diagnosis present

## 2013-05-30 DIAGNOSIS — N186 End stage renal disease: Secondary | ICD-10-CM | POA: Diagnosis present

## 2013-05-30 DIAGNOSIS — T879 Unspecified complications of amputation stump: Secondary | ICD-10-CM | POA: Diagnosis present

## 2013-05-30 DIAGNOSIS — J4489 Other specified chronic obstructive pulmonary disease: Secondary | ICD-10-CM | POA: Diagnosis present

## 2013-05-30 DIAGNOSIS — Z794 Long term (current) use of insulin: Secondary | ICD-10-CM

## 2013-05-30 DIAGNOSIS — Z9852 Vasectomy status: Secondary | ICD-10-CM

## 2013-05-30 DIAGNOSIS — Y835 Amputation of limb(s) as the cause of abnormal reaction of the patient, or of later complication, without mention of misadventure at the time of the procedure: Secondary | ICD-10-CM | POA: Diagnosis present

## 2013-05-30 DIAGNOSIS — I509 Heart failure, unspecified: Secondary | ICD-10-CM | POA: Diagnosis present

## 2013-05-30 DIAGNOSIS — Z833 Family history of diabetes mellitus: Secondary | ICD-10-CM

## 2013-05-30 DIAGNOSIS — M109 Gout, unspecified: Secondary | ICD-10-CM | POA: Diagnosis present

## 2013-05-30 DIAGNOSIS — E1149 Type 2 diabetes mellitus with other diabetic neurological complication: Secondary | ICD-10-CM | POA: Diagnosis present

## 2013-05-30 DIAGNOSIS — S98139A Complete traumatic amputation of one unspecified lesser toe, initial encounter: Secondary | ICD-10-CM

## 2013-05-30 DIAGNOSIS — F17200 Nicotine dependence, unspecified, uncomplicated: Secondary | ICD-10-CM | POA: Diagnosis present

## 2013-05-30 DIAGNOSIS — E1142 Type 2 diabetes mellitus with diabetic polyneuropathy: Secondary | ICD-10-CM | POA: Diagnosis present

## 2013-05-30 DIAGNOSIS — Z992 Dependence on renal dialysis: Secondary | ICD-10-CM

## 2013-05-30 DIAGNOSIS — I4891 Unspecified atrial fibrillation: Secondary | ICD-10-CM | POA: Diagnosis present

## 2013-05-30 DIAGNOSIS — E785 Hyperlipidemia, unspecified: Secondary | ICD-10-CM | POA: Diagnosis present

## 2013-05-30 DIAGNOSIS — Z7901 Long term (current) use of anticoagulants: Secondary | ICD-10-CM

## 2013-05-30 DIAGNOSIS — I739 Peripheral vascular disease, unspecified: Secondary | ICD-10-CM | POA: Diagnosis present

## 2013-05-30 HISTORY — PX: AMPUTATION: SHX166

## 2013-05-30 LAB — POCT I-STAT 4, (NA,K, GLUC, HGB,HCT)
Glucose, Bld: 246 mg/dL — ABNORMAL HIGH (ref 70–99)
HCT: 36 % — ABNORMAL LOW (ref 39.0–52.0)
Hemoglobin: 12.2 g/dL — ABNORMAL LOW (ref 13.0–17.0)

## 2013-05-30 LAB — GLUCOSE, CAPILLARY
Glucose-Capillary: 239 mg/dL — ABNORMAL HIGH (ref 70–99)
Glucose-Capillary: 149 mg/dL — ABNORMAL HIGH (ref 70–99)

## 2013-05-30 LAB — PROTIME-INR: Prothrombin Time: 18.2 seconds — ABNORMAL HIGH (ref 11.6–15.2)

## 2013-05-30 SURGERY — AMPUTATION BELOW KNEE
Anesthesia: General | Site: Leg Lower | Laterality: Left

## 2013-05-30 MED ORDER — SODIUM CHLORIDE 0.9 % IV SOLN
125.0000 mg | INTRAVENOUS | Status: DC
  Administered 2013-05-31: 125 mg via INTRAVENOUS
  Filled 2013-05-30 (×2): qty 10

## 2013-05-30 MED ORDER — WARFARIN - PHARMACIST DOSING INPATIENT: Freq: Every day | Status: DC

## 2013-05-30 MED ORDER — INSULIN ASPART 100 UNIT/ML ~~LOC~~ SOLN
0.0000 [IU] | Freq: Three times a day (TID) | SUBCUTANEOUS | Status: DC
  Administered 2013-05-30 (×2): 3 [IU] via SUBCUTANEOUS
  Administered 2013-05-31 (×2): 1 [IU] via SUBCUTANEOUS
  Administered 2013-05-31: 2 [IU] via SUBCUTANEOUS
  Administered 2013-06-01: 3 [IU] via SUBCUTANEOUS

## 2013-05-30 MED ORDER — HYDROMORPHONE HCL PF 1 MG/ML IJ SOLN
0.2500 mg | INTRAMUSCULAR | Status: DC | PRN
  Administered 2013-05-30 (×3): 0.5 mg via INTRAVENOUS

## 2013-05-30 MED ORDER — CALCIUM ACETATE 667 MG PO CAPS
667.0000 mg | ORAL_CAPSULE | Freq: Three times a day (TID) | ORAL | Status: DC
  Filled 2013-05-30 (×2): qty 1

## 2013-05-30 MED ORDER — WARFARIN SODIUM 3 MG PO TABS: 3.0000 mg | ORAL_TABLET | Freq: Every day | ORAL | Status: DC

## 2013-05-30 MED ORDER — INSULIN GLARGINE 100 UNIT/ML ~~LOC~~ SOLN
25.0000 [IU] | Freq: Every day | SUBCUTANEOUS | Status: DC
  Administered 2013-05-30 – 2013-05-31 (×2): 25 [IU] via SUBCUTANEOUS
  Filled 2013-05-30 (×3): qty 0.25

## 2013-05-30 MED ORDER — SODIUM CHLORIDE 0.9 % IV SOLN
INTRAVENOUS | Status: DC
Start: 2013-05-30 — End: 2013-06-01

## 2013-05-30 MED ORDER — METOCLOPRAMIDE HCL 5 MG/ML IJ SOLN: 5.0000 mg | Freq: Three times a day (TID) | INTRAMUSCULAR | Status: DC | PRN

## 2013-05-30 MED ORDER — HYDROMORPHONE HCL PF 1 MG/ML IJ SOLN
INTRAMUSCULAR | Status: AC
  Filled 2013-05-30: qty 1

## 2013-05-30 MED ORDER — WARFARIN SODIUM 5 MG PO TABS
5.0000 mg | ORAL_TABLET | Freq: Once | ORAL | Status: AC
  Administered 2013-05-30: 5 mg via ORAL
  Filled 2013-05-30: qty 1

## 2013-05-30 MED ORDER — ONDANSETRON HCL 4 MG/2ML IJ SOLN: 4.0000 mg | Freq: Once | INTRAMUSCULAR | Status: DC | PRN

## 2013-05-30 MED ORDER — DOXERCALCIFEROL 4 MCG/2ML IV SOLN: 2.0000 ug | INTRAVENOUS | Status: DC

## 2013-05-30 MED ORDER — FENTANYL CITRATE 0.05 MG/ML IJ SOLN
INTRAMUSCULAR | Status: DC | PRN
  Administered 2013-05-30: 150 ug via INTRAVENOUS
  Administered 2013-05-30 (×2): 50 ug via INTRAVENOUS

## 2013-05-30 MED ORDER — PROPOFOL 10 MG/ML IV BOLUS
INTRAVENOUS | Status: DC | PRN
  Administered 2013-05-30: 200 mg via INTRAVENOUS

## 2013-05-30 MED ORDER — ALLOPURINOL 100 MG PO TABS
100.0000 mg | ORAL_TABLET | Freq: Every day | ORAL | Status: DC
  Administered 2013-05-31 – 2013-06-01 (×2): 100 mg via ORAL
  Filled 2013-05-30 (×2): qty 1

## 2013-05-30 MED ORDER — OXYCODONE-ACETAMINOPHEN 5-325 MG PO TABS
1.0000 | ORAL_TABLET | ORAL | Status: DC | PRN
  Administered 2013-05-30 – 2013-06-01 (×9): 2 via ORAL
  Filled 2013-05-30 (×9): qty 2

## 2013-05-30 MED ORDER — PANTOPRAZOLE SODIUM 40 MG PO TBEC
40.0000 mg | DELAYED_RELEASE_TABLET | Freq: Every day | ORAL | Status: DC
  Administered 2013-05-31 – 2013-06-01 (×2): 40 mg via ORAL
  Filled 2013-05-30 (×2): qty 1

## 2013-05-30 MED ORDER — TEMAZEPAM 15 MG PO CAPS
30.0000 mg | ORAL_CAPSULE | Freq: Every day | ORAL | Status: DC
  Administered 2013-05-30 – 2013-05-31 (×2): 30 mg via ORAL
  Filled 2013-05-30 (×2): qty 2

## 2013-05-30 MED ORDER — SODIUM CHLORIDE 0.9 % IV SOLN
INTRAVENOUS | Status: DC
  Administered 2013-05-30: 09:00:00 via INTRAVENOUS

## 2013-05-30 MED ORDER — CALCIUM ACETATE 667 MG PO CAPS
1334.0000 mg | ORAL_CAPSULE | Freq: Three times a day (TID) | ORAL | Status: DC
  Administered 2013-05-30 – 2013-05-31 (×3): 1334 mg via ORAL
  Filled 2013-05-30 (×8): qty 2

## 2013-05-30 MED ORDER — METOCLOPRAMIDE HCL 10 MG PO TABS: 5.0000 mg | ORAL_TABLET | Freq: Three times a day (TID) | ORAL | Status: DC | PRN

## 2013-05-30 MED ORDER — ONDANSETRON HCL 4 MG/2ML IJ SOLN
INTRAMUSCULAR | Status: DC | PRN
  Administered 2013-05-30: 4 mg via INTRAVENOUS

## 2013-05-30 MED ORDER — GABAPENTIN 300 MG PO CAPS
300.0000 mg | ORAL_CAPSULE | Freq: Three times a day (TID) | ORAL | Status: DC
  Administered 2013-05-30 – 2013-06-01 (×5): 300 mg via ORAL
  Filled 2013-05-30 (×9): qty 1

## 2013-05-30 MED ORDER — ONDANSETRON HCL 4 MG PO TABS: 4.0000 mg | ORAL_TABLET | Freq: Four times a day (QID) | ORAL | Status: DC | PRN

## 2013-05-30 MED ORDER — ARTIFICIAL TEARS OP OINT
TOPICAL_OINTMENT | OPHTHALMIC | Status: DC | PRN
  Administered 2013-05-30: 1 via OPHTHALMIC

## 2013-05-30 MED ORDER — HYDROCODONE-ACETAMINOPHEN 5-325 MG PO TABS
1.0000 | ORAL_TABLET | ORAL | Status: DC | PRN
Start: 2013-05-30 — End: 2013-06-01
  Administered 2013-05-30: 1 via ORAL
  Filled 2013-05-30 (×2): qty 1

## 2013-05-30 MED ORDER — INSULIN ASPART 100 UNIT/ML ~~LOC~~ SOLN
3.0000 [IU] | Freq: Three times a day (TID) | SUBCUTANEOUS | Status: DC
  Administered 2013-05-30 – 2013-06-01 (×6): 3 [IU] via SUBCUTANEOUS

## 2013-05-30 MED ORDER — HYDROMORPHONE HCL PF 1 MG/ML IJ SOLN
0.5000 mg | INTRAMUSCULAR | Status: DC | PRN
  Administered 2013-05-30 (×2): 1 mg via INTRAVENOUS
  Filled 2013-05-30 (×2): qty 1

## 2013-05-30 MED ORDER — LANTHANUM CARBONATE 500 MG PO CHEW
2000.0000 mg | CHEWABLE_TABLET | Freq: Three times a day (TID) | ORAL | Status: DC
  Administered 2013-05-30 – 2013-05-31 (×2): 2000 mg via ORAL
  Filled 2013-05-30 (×8): qty 4

## 2013-05-30 MED ORDER — METHOCARBAMOL 100 MG/ML IJ SOLN
500.0000 mg | Freq: Four times a day (QID) | INTRAVENOUS | Status: DC | PRN
  Filled 2013-05-30: qty 5

## 2013-05-30 MED ORDER — LIDOCAINE HCL (CARDIAC) 20 MG/ML IV SOLN
INTRAVENOUS | Status: DC | PRN
  Administered 2013-05-30: 80 mg via INTRAVENOUS

## 2013-05-30 MED ORDER — METHOCARBAMOL 500 MG PO TABS
500.0000 mg | ORAL_TABLET | Freq: Four times a day (QID) | ORAL | Status: DC | PRN
  Administered 2013-05-30: 500 mg via ORAL
  Filled 2013-05-30: qty 1

## 2013-05-30 MED ORDER — DARBEPOETIN ALFA-POLYSORBATE 150 MCG/0.3ML IJ SOLN: 150.0000 ug | INTRAMUSCULAR | Status: DC

## 2013-05-30 MED ORDER — ONDANSETRON HCL 4 MG/2ML IJ SOLN: 4.0000 mg | Freq: Four times a day (QID) | INTRAMUSCULAR | Status: DC | PRN

## 2013-05-30 SURGICAL SUPPLY — 46 items
BANDAGE ESMARK 6X9 LF (GAUZE/BANDAGES/DRESSINGS) ×1 IMPLANT
BANDAGE GAUZE ELAST BULKY 4 IN (GAUZE/BANDAGES/DRESSINGS) ×3 IMPLANT
BLADE SAW RECIP 87.9 MT (BLADE) ×2 IMPLANT
BLADE SURG 21 STRL SS (BLADE) ×2 IMPLANT
BNDG CMPR 9X6 STRL LF SNTH (GAUZE/BANDAGES/DRESSINGS) ×1
BNDG COHESIVE 6X5 TAN STRL LF (GAUZE/BANDAGES/DRESSINGS) ×3 IMPLANT
BNDG ESMARK 6X9 LF (GAUZE/BANDAGES/DRESSINGS) ×2
CLOTH BEACON ORANGE TIMEOUT ST (SAFETY) ×2 IMPLANT
COVER SURGICAL LIGHT HANDLE (MISCELLANEOUS) ×2 IMPLANT
CUFF TOURNIQUET SINGLE 34IN LL (TOURNIQUET CUFF) IMPLANT
CUFF TOURNIQUET SINGLE 44IN (TOURNIQUET CUFF) IMPLANT
DRAIN PENROSE 1/2X12 LTX STRL (WOUND CARE) IMPLANT
DRAPE EXTREMITY T 121X128X90 (DRAPE) ×2 IMPLANT
DRAPE PROXIMA HALF (DRAPES) ×4 IMPLANT
DRAPE U-SHAPE 47X51 STRL (DRAPES) ×4 IMPLANT
DRSG ADAPTIC 3X8 NADH LF (GAUZE/BANDAGES/DRESSINGS) ×2 IMPLANT
DRSG EMULSION OIL 3X3 NADH (GAUZE/BANDAGES/DRESSINGS) ×1 IMPLANT
DRSG PAD ABDOMINAL 8X10 ST (GAUZE/BANDAGES/DRESSINGS) ×2 IMPLANT
DURAPREP 26ML APPLICATOR (WOUND CARE) ×2 IMPLANT
ELECT REM PT RETURN 9FT ADLT (ELECTROSURGICAL) ×2
ELECTRODE REM PT RTRN 9FT ADLT (ELECTROSURGICAL) ×1 IMPLANT
GLOVE BIOGEL PI IND STRL 9 (GLOVE) ×1 IMPLANT
GLOVE BIOGEL PI INDICATOR 9 (GLOVE) ×1
GLOVE SURG ORTHO 9.0 STRL STRW (GLOVE) ×2 IMPLANT
GOWN PREVENTION PLUS XLARGE (GOWN DISPOSABLE) ×2 IMPLANT
GOWN SRG XL XLNG 56XLVL 4 (GOWN DISPOSABLE) ×1 IMPLANT
GOWN STRL NON-REIN XL XLG LVL4 (GOWN DISPOSABLE) ×2
KIT BASIN OR (CUSTOM PROCEDURE TRAY) ×2 IMPLANT
KIT ROOM TURNOVER OR (KITS) ×2 IMPLANT
MANIFOLD NEPTUNE II (INSTRUMENTS) ×2 IMPLANT
NS IRRIG 1000ML POUR BTL (IV SOLUTION) ×2 IMPLANT
PACK GENERAL/GYN (CUSTOM PROCEDURE TRAY) ×2 IMPLANT
PAD ARMBOARD 7.5X6 YLW CONV (MISCELLANEOUS) ×4 IMPLANT
SPONGE GAUZE 4X4 12PLY (GAUZE/BANDAGES/DRESSINGS) ×2 IMPLANT
SPONGE GAUZE 4X4 12PLY STER LF (GAUZE/BANDAGES/DRESSINGS) ×1 IMPLANT
SPONGE LAP 18X18 X RAY DECT (DISPOSABLE) IMPLANT
STAPLER VISISTAT 35W (STAPLE) IMPLANT
STOCKINETTE IMPERVIOUS LG (DRAPES) ×2 IMPLANT
SUT PDS AB 1 CT  36 (SUTURE)
SUT PDS AB 1 CT 36 (SUTURE) IMPLANT
SUT SILK 2 0 (SUTURE) ×2
SUT SILK 2-0 18XBRD TIE 12 (SUTURE) ×1 IMPLANT
TOWEL OR 17X24 6PK STRL BLUE (TOWEL DISPOSABLE) ×2 IMPLANT
TOWEL OR 17X26 10 PK STRL BLUE (TOWEL DISPOSABLE) ×2 IMPLANT
TUBE ANAEROBIC SPECIMEN COL (MISCELLANEOUS) IMPLANT
WATER STERILE IRR 1000ML POUR (IV SOLUTION) ×2 IMPLANT

## 2013-05-30 NOTE — Op Note (Signed)
OPERATIVE REPORT  DATE OF SURGERY: 05/30/2013  PATIENT:  [name],  62 y.o. male  PRE-OPERATIVE DIAGNOSIS:  Left BKA wound dehiscence  POST-OPERATIVE DIAGNOSIS:  Left BKA wound dehiscence  PROCEDURE:  Procedure(s): REVISION AMPUTATION BELOW KNEE  SURGEON:  Surgeon(s): Nadara Mustard, MD  ANESTHESIA:   general  EBL:  500 ML  SPECIMEN:  No Specimen  TOURNIQUET:  * No tourniquets in log *  PROCEDURE DETAILS: Patient is a 62 year old gentleman with diabetic insensate neuropathy end-stage renal disease on dialysis who has fallen several times on his left residual limb and this caused ischemic breakdown and dehiscence of the wound. Patient presents at this time for revision amputation versus above-the-knee amputation. Risks and benefits were discussed including nonhealing of the wound. Patient states he understands and wished to proceed at this time. Description of procedure patient was brought to the operating room and underwent a general anesthetic. After adequate levels of anesthesia were obtained patient's left lower extremity was prepped using DuraPrep and draped into a sterile field. The necrotic dehisced wound was ellipsed out in one block of tissue. The tibia and fibular were resected approximately 3 cm deep necrotic tissue was excised. Patient then had good bleeding but the muscles did appear ischemic. The vascular bundles were suture ligated with 2-0 silk. The wound was irrigated with normal saline. The flap was closed using 2-0 nylon. There was no tension on the skin. The wound is covered with Adaptic orthopedic sponges AB dressing Kerlix and Coban. Patient was extubated taken to the PACU in stable condition.  PLAN OF CARE: Admit for overnight observation  PATIENT DISPOSITION:  PACU - hemodynamically stable.   Nadara Mustard, MD 05/30/2013 10:53 AM

## 2013-05-30 NOTE — Anesthesia Postprocedure Evaluation (Signed)
  Anesthesia Post-op Note  Patient: [name]  Procedure(s) Performed: Procedure(s) with comments: REVISION AMPUTATION BELOW KNEE (Left) - Revision Left Below Knee Amputation, Possible Above Knee Amputation  Patient Location: PACU  Anesthesia Type:General  Level of Consciousness: awake, alert , oriented and patient cooperative  Airway and Oxygen Therapy: Patient Spontanous Breathing  Post-op Pain: moderate  Post-op Assessment: Post-op Vital signs reviewed, Patient's Cardiovascular Status Stable, Respiratory Function Stable, Patent Airway, No signs of Nausea or vomiting and Pain level controlled  Post-op Vital Signs: stable  Complications: No apparent anesthesia complications

## 2013-05-30 NOTE — Anesthesia Preprocedure Evaluation (Signed)
Anesthesia Evaluation  Patient identified by MRN, date of birth, ID band Patient awake    Reviewed: Allergy & Precautions, H&P , NPO status , Patient's Chart, lab work & pertinent test results  Airway       Dental   Pulmonary COPDCurrent Smoker,          Cardiovascular hypertension, + CAD and + Peripheral Vascular Disease + dysrhythmias Atrial Fibrillation     Neuro/Psych  Headaches,  Neuromuscular disease    GI/Hepatic GERD-  ,  Endo/Other  diabetes, Type 2, Insulin Dependent  Renal/GU Dialysis and ESRFRenal disease     Musculoskeletal   Abdominal   Peds  Hematology  (+) Blood dyscrasia, ,   Anesthesia Other Findings   Reproductive/Obstetrics                           Anesthesia Physical Anesthesia Plan  ASA: III  Anesthesia Plan: General   Post-op Pain Management:    Induction:   Airway Management Planned: LMA  Additional Equipment:   Intra-op Plan:   Post-operative Plan: Extubation in OR  Informed Consent: I have reviewed the patients History and Physical, chart, labs and discussed the procedure including the risks, benefits and alternatives for the proposed anesthesia with the patient or authorized representative who has indicated his/her understanding and acceptance.     Plan Discussed with:   Anesthesia Plan Comments:         Anesthesia Quick Evaluation

## 2013-05-30 NOTE — Transfer of Care (Signed)
Immediate Anesthesia Transfer of Care Note  Patient: [name]  Procedure(s) Performed: Procedure(s) with comments: REVISION AMPUTATION BELOW KNEE (Left) - Revision Left Below Knee Amputation, Possible Above Knee Amputation  Patient Location: PACU  Anesthesia Type:General  Level of Consciousness: awake, alert  and oriented  Airway & Oxygen Therapy: Patient Spontanous Breathing and Patient connected to face mask oxygen  Post-op Assessment: Report given to PACU RN  Post vital signs: Reviewed and stable  Complications: No apparent anesthesia complications

## 2013-05-30 NOTE — Progress Notes (Signed)
ANTICOAGULATION CONSULT NOTE - Initial Consult  Pharmacy Consult for coumadin Indication: Afib  No Known Allergies  Patient Measurements: Height: 6\' 1"  (185.4 cm) Weight: 229 lb 4.5 oz (104 kg) IBW/kg (Calculated) : 79.9  Vital Signs: Temp: 97.5 F (36.4 C) (12/17 1200) Temp src: Oral (12/17 1200) BP: 147/90 mmHg (12/17 1200) Pulse Rate: 118 (12/17 1200)  Labs:  Recent Labs  05/30/13 0905 05/30/13 0939  HGB 12.2*  --   HCT 36.0*  --   LABPROT  --  18.2*  INR  --  1.55*    Estimated Creatinine Clearance: 17.2 ml/min (by C-G formula based on Cr of 5.65).   Medical History: Past Medical History  Diagnosis Date  . Chronic kidney disease     dialysis t,th,sat  . GERD (gastroesophageal reflux disease)   . Hypertension   . Neuromuscular disorder     neurophathy  . Atrial fibrillation     chronically anticoagulated with coumadin  . Hyperlipidemia   . Atrial flutter 07/07/2009    s/p ablation  . DIABETES MELLITUS, TYPE II 08/01/2008  . GOUT 08/01/2008  . ERECTILE DYSFUNCTION 08/01/2008  . PERIPHERAL NEUROPATHY 08/01/2008  . CORONARY ARTERY DISEASE 08/01/2008    non obstructive per cath 1/10; Dr. Juanda Chance  . 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 Gastroparesis 08/01/2008  . End stage renal failure on dialysis 08/01/2008  . NEPHROLITHIASIS, HX OF 08/01/2008  . Chronic systolic heart failure 06/21/2010  . COPD 07/21/2010  . Diverticulosis of colon (without mention of hemorrhage) 10/28/2010  . Benign neoplasm of colon 10/28/2010  . Hemorrhoids, internal 10/28/2010  . Renal cyst     bilateral  . Second degree Mobitz I AV block     asymptomatic  . Cyst     infected cyst on back   . Muscle spasm of back     back muscle spasms and pt is unable to lay flat   . Abscess of back     lower middle portion of back  . Headache(784.0)     HX; of migraines 25-30 years ago  . Cancer     bBasal cell on nose  . Peripheral vascular disease     poor circulation    Assessment: Patient is a 62 y.o M on coumadin PTA for afib.  Per patient, his home regimen is 5mg  daily except 2.5mg  on Tuesdays and Thursdays with last dose taken on 12/16.  His INR today is 1.55.  He had revision of left BKA this morning.  To resume coumadin today per MD.  Goal of Therapy:  INR 2-3    Plan:  1) coumadin 5mg  PO x1 today  Kimie Pidcock P 05/30/2013,1:12 PM

## 2013-05-30 NOTE — Preoperative (Signed)
Beta Blockers   Reason not to administer Beta Blockers:Not Applicable 

## 2013-05-30 NOTE — Consult Note (Signed)
[facility] Renal Consultation Note    Indication for Consultation:  Management of ESRD/hemodialysis; anemia, hypertension/volume and secondary hyperparathyroidism  HPI: [name] is a 62 y.o. male ESRD patient (TTS HD) with multiple medical problems and recent history of falls precipitating wound dehiscence of his left below knee amputation. The original amputation took place in August 2014 and he has since had a debridement and revision. He was admitted and taken to the OR today for a 2nd revision vs above knee amputation with Dr. [name]. Per notes, procedure went well and the AKA was not required. At the time of this encounter, [name] is resting in bed with his pain well controlled. He denies any emerging complaints and is pleased with the outcome of the surgery.  Chart Review 2008 aflutter ablation procedure, DM, HTN, CKD 2009 heart cath, non obstructive disease Mar 2010  Charcot foot surg Jan 2011  Aflutter exacerbation Mar 2011  Left arm brachiobasilic AVF, dialysis preparation Apr 2011  Charcot foot surg June 2012 Charcot foot surg July 2012  Salmonella enteritis July 2014   I&D traumatic ulceration L leg Aug 2014   I&D left foot and leg abcess/osteo, L 5th ray amp Aug 2014   L BKA Mar 23, 2013 repair dehisced BK stump May 04, 2013 repair dehisced BK stump May 30, 2013 (today) repair dehisced BK stump   Past Medical History  Diagnosis Date  . Chronic kidney disease     dialysis t,th,sat  . GERD (gastroesophageal reflux disease)   . Hypertension   . Neuromuscular disorder     neurophathy  . Atrial fibrillation     chronically anticoagulated with coumadin  . Hyperlipidemia   . Atrial flutter 07/07/2009    s/p ablation  . DIABETES MELLITUS, TYPE II 08/01/2008  . GOUT 08/01/2008  . ERECTILE DYSFUNCTION 08/01/2008  . PERIPHERAL NEUROPATHY 08/01/2008  . CORONARY ARTERY DISEASE 08/01/2008    non obstructive per cath 1/10; Dr. Juanda Chance  . 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 Gastroparesis 08/01/2008  . End stage renal failure on dialysis 08/01/2008  . NEPHROLITHIASIS, HX OF 08/01/2008  . Chronic systolic heart failure 06/21/2010  . COPD 07/21/2010  . Diverticulosis of colon (without mention of hemorrhage) 10/28/2010  . Benign neoplasm of colon 10/28/2010  . Hemorrhoids, internal 10/28/2010  . Renal cyst     bilateral  . Second degree Mobitz I AV block     asymptomatic  . Cyst     infected cyst on back   . Muscle spasm of back     back muscle spasms and pt is unable to lay flat   . Abscess of back     lower middle portion of back  . Headache(784.0)     HX; of migraines 25-30 years ago  . Cancer     bBasal cell on nose  . Peripheral vascular disease     poor circulation   Past Surgical History  Procedure Laterality Date  . Atrial ablation surgery  07-2008, 07/08/09    Dr Johney Frame  . Right foot  09-2009    infection  . Colonoscopy  52yrs ago    Dr.Orr  . Av fistula placement      left upper arm  . S/p right arm surgery after fracture    . Pilonidal cyst excision      s/p  . Cataract extraction      Hx: right eye  . Tonsillectomy    .  I&d extremity Left 12/29/2012    Procedure: IRRIGATION AND DEBRIDEMENT EXTREMITY;  Surgeon: Nadara Mustard, MD;  Location: [facility] OR;  Service: Orthopedics;  Laterality: Left;  Irrigation and Debridement Left Leg, VAC, [place]in  . Toe amputation Right 01/18/2013    Dr [name]  . I&d extremity Bilateral 01/18/2013    Procedure: IRRIGATION AND DEBRIDEMENT EXTREMITYLEFT LEG AND RIGHT FOOT;  Surgeon: Nadara Mustard, MD;  Location: [facility] OR;  Service: Orthopedics;  Laterality: Bilateral;  Left Leg Excisional Debridement  . Skin graft Left     left leg within the past month  . Amputation Left 02/02/2013    Procedure: AMPUTATION BELOW KNEE;  Surgeon: Nadara Mustard, MD;  Location: [facility] OR;  Service: Orthopedics;  Laterality: Left;  Left Below Knee Amputation  . Fracture surgery    . Vasectomy    .  Amputation Left 03/23/2013    Procedure: REVISION OF BELOW THE KNEE  AMPUTATION ;  Surgeon: Nadara Mustard, MD;  Location: [facility] OR;  Service: Orthopedics;  Laterality: Left;  Left Below Knee Amputation Revision  . I&d extremity Left 05/04/2013    Procedure: Irrigation and Debridement Left Below Knee Ampuation;  Surgeon: Nadara Mustard, MD;  Location: [facility] OR;  Service: Orthopedics;  Laterality: Left;  Irrigation and Debridement Left Below Knee Ampuation  . Av fistula placement Left    Family History  Problem Relation Age of Onset  . Diabetes Mother   . Diabetes Maternal Grandmother   . Diabetes Paternal Grandmother   . Cancer Neg Hx     no FH of Colon Cancer   Social History:  reports that he has been smoking Cigarettes.  He has a 10 pack-year smoking history. He has never used smokeless tobacco. He reports that he does not drink alcohol or use illicit drugs. No Known Allergies Prior to Admission medications   Medication Sig Start Date End Date Taking? Authorizing Provider  allopurinol (ZYLOPRIM) 100 MG tablet Take 100 mg by mouth daily.  07/30/10  Yes Historical Provider, MD  B Complex-C-Folic Acid (DIALYVITE PO) Take 100 mg by mouth daily.     Yes Historical Provider, MD  calcium acetate (PHOSLO) 667 MG capsule Take 667 mg by mouth 3 (three) times daily with meals.   Yes Historical Provider, MD  doxycycline (VIBRA-TABS) 100 MG tablet Take 100 mg by mouth daily. For 1 month. Started 03/07/13   Yes Historical Provider, MD  ethyl chloride spray Apply 1 application topically daily as needed. For pain 11/01/11  Yes Historical Provider, MD  FOSRENOL 1000 MG chewable tablet Chew 2 tablets by mouth 3 (three) times daily with meals.  09/24/10  Yes Historical Provider, MD  gabapentin (NEURONTIN) 300 MG capsule Take 300 mg by mouth 3 (three) times daily.   Yes Historical Provider, MD  HYDROcodone-acetaminophen (NORCO/VICODIN) 5-325 MG per tablet Take 1 tablet by mouth every 4 (four) hours as needed for pain.    Yes Historical Provider, MD  insulin aspart (NOVOLOG) 100 UNIT/ML injection Inject 20 Units into the skin 3 (three) times daily before meals.  06/19/12 06/19/13 Yes Romero Belling, MD  insulin glargine (LANTUS) 100 UNIT/ML injection Inject 25 Units into the skin at bedtime.    Yes Historical Provider, MD  loperamide (IMODIUM) 2 MG capsule Take 2 mg by mouth 4 (four) times daily as needed for diarrhea or loose stools.   Yes Historical Provider, MD  omeprazole (PRILOSEC) 20 MG capsule Take 20 mg by mouth daily.   Yes Historical Provider,  MD  temazepam (RESTORIL) 30 MG capsule Take 30 mg by mouth at bedtime.   Yes Historical Provider, MD  warfarin (COUMADIN) 6 MG tablet Take 3-6 mg by mouth daily. Take 3mg  on tuesday and Thursday. Take 6mg  the rest of the week   Yes Historical Provider, MD   Current Facility-Administered Medications  Medication Dose Route Frequency Provider Last Rate Last Dose  . 0.9 %  sodium chloride infusion   Intravenous Continuous Nadara Mustard, MD 10 mL/hr at 05/30/13 1243 10 mL/hr at 05/30/13 1243  . [START ON 05/31/2013] allopurinol (ZYLOPRIM) tablet 100 mg  100 mg Oral Daily Nadara Mustard, MD      . calcium acetate (PHOSLO) capsule 667 mg  667 mg Oral TID WC Nadara Mustard, MD      . gabapentin (NEURONTIN) capsule 300 mg  300 mg Oral TID Nadara Mustard, MD      . HYDROcodone-acetaminophen (NORCO/VICODIN) 5-325 MG per tablet 1-2 tablet  1-2 tablet Oral Q4H PRN Nadara Mustard, MD      . HYDROmorphone (DILAUDID) 1 MG/ML injection           . HYDROmorphone (DILAUDID) 1 MG/ML injection           . HYDROmorphone (DILAUDID) injection 0.5-1 mg  0.5-1 mg Intravenous Q2H PRN Nadara Mustard, MD      . insulin aspart (novoLOG) injection 0-9 Units  0-9 Units Subcutaneous TID WC Nadara Mustard, MD   3 Units at 05/30/13 1253  . insulin aspart (novoLOG) injection 3 Units  3 Units Subcutaneous TID WC Nadara Mustard, MD   3 Units at 05/30/13 1252  . insulin glargine (LANTUS) injection 25 Units  25  Units Subcutaneous QHS Nadara Mustard, MD      . lanthanum [facility]) chewable tablet 2,000 mg  2,000 mg Oral TID WC Nadara Mustard, MD      . methocarbamol (ROBAXIN) tablet 500 mg  500 mg Oral Q6H PRN Nadara Mustard, MD       Or  . methocarbamol (ROBAXIN) 500 mg in dextrose 5 % 50 mL IVPB  500 mg Intravenous Q6H PRN Nadara Mustard, MD      . metoCLOPramide (REGLAN) tablet 5-10 mg  5-10 mg Oral Q8H PRN Nadara Mustard, MD       Or  . metoCLOPramide (REGLAN) injection 5-10 mg  5-10 mg Intravenous Q8H PRN Nadara Mustard, MD      . ondansetron [facility]) tablet 4 mg  4 mg Oral Q6H PRN Nadara Mustard, MD       Or  . ondansetron [facility]) injection 4 mg  4 mg Intravenous Q6H PRN Nadara Mustard, MD      . oxyCODONE-acetaminophen (PERCOCET/ROXICET) 5-325 MG per tablet 1-2 tablet  1-2 tablet Oral Q4H PRN Nadara Mustard, MD   2 tablet at 05/30/13 1242  . [START ON 05/31/2013] pantoprazole (PROTONIX) EC tablet 40 mg  40 mg Oral Daily Nadara Mustard, MD      . temazepam (RESTORIL) capsule 30 mg  30 mg Oral QHS Nadara Mustard, MD      . warfarin (COUMADIN) tablet 3-6 mg  3-6 mg Oral Daily Nadara Mustard, MD       Labs: Basic Metabolic Panel:  Recent Labs Lab 05/30/13 0905  NA 139  K 4.4  GLUCOSE 246*   CBC:  Recent Labs Lab 05/30/13 0905  HGB 12.2*  HCT 36.0*   Studies/Results: No  results found.  ROS: Mild L stump pain. 10-pt ROS asked and answered. All systems negative except as above.   Physical Exam: Filed Vitals:   05/30/13 1135 05/30/13 1143 05/30/13 1145 05/30/13 1200  BP: 141/86   147/90  Pulse: 105 104  118  Temp:   97 F (36.1 C) 97.5 F (36.4 C)  TempSrc:    Oral  Resp: 15 12  16   Height:      Weight:      SpO2: 92% 94%  95%     General: Well developed, well nourished, in no acute distress. Head: Normocephalic, atraumatic, sclera non-icteric, mucus membranes are moist Neck: Supple. JVD not elevated. Lungs: Clear bilaterally to auscultation without wheezes, rales, or rhonchi.  Breathing is unlabored. Heart: Irreg Irreg. No murmurs, rubs, or gallops appreciated. Abdomen: Soft, non-tender, non-distended with normoactive bowel sounds. No rebound/guarding. No obvious abdominal masses. M-S:  Strength and tone appear normal for age. Lower extremities: L BKA wrapped, dressing free of drainage. SCD to RLE, no pretibial edema  Neuro: Alert and oriented X 3. Moves all extremities spontaneously. Psych:  Responds to questions appropriately with a normal affect. Dialysis Access: L AVF + bruit  Dialysis Orders: Center: AF  on TTS. EDW 104 kg HD Bath 2K/2.25Ca  Time 4:15 Heparin 0 Access L AVF BFR 450 DFR A1.5    Hectorol 2 mcg IV/HD Epogen 20K Units IV/HD  Venofer  Load in progress, 100 mg IV/HD with 8 remaining doses then 50 mg weekly Recent labs: Hgb 9.8, Tsat 11%, P 6, PTH 442  Assessment/Plan: 1. Left BKA revision: 12/17 per Dr. [name] 2. ESRD -  TTS, Next HD tomorrow, No heparin,  K+ 4.4 3. Hypertension/volume  - SBPs 140s, No home BP meds. At edw here. UF 1-2L tommorow.  4. Anemia  - Hgb 12.2, on high dose op Epo. Anticipate post op drop. Aranesp 150 for Saturday. Continue op Fe load, 8 doses remaining. CBC in the am 5. Metabolic bone disease -  Ca 9.9. Last P 6 on Fosrenol and Phoslo which was recently increased op to 2 ac. Will order. Continue Vit D 6. Nutrition - Renal diet, multivitamin 7. DM II - on insulin   Claud Kelp, PA-C [facility] Pager [phone] 05/30/2013, 1:23 PM   I have seen and examined patient, discussed with PA and agree with assessment and plan as outlined above. Vinson Moselle MD pager [phone]    cell [phone] 05/30/2013, 4:01 PM

## 2013-05-30 NOTE — H&P (Signed)
[name] is an 62 y.o. male.   Chief Complaint: Patient has fallen on his left transtibial amputation again this had complete dehiscence with breakdown of the wound. HPI: Patient is a 62 year old gentleman diabetic insensate neuropathy end-stage renal disease on dialysis as well as a smoker who has had recurrent breakdown of his residual limb. Patient is followed on the revised below knee amputation and has breakdown and dehiscence.  Past Medical History  Diagnosis Date  . Chronic kidney disease     dialysis t,th,sat  . GERD (gastroesophageal reflux disease)   . Hypertension   . Neuromuscular disorder     neurophathy  . Atrial fibrillation     chronically anticoagulated with coumadin  . Hyperlipidemia   . Atrial flutter 07/07/2009    s/p ablation  . DIABETES MELLITUS, TYPE II 08/01/2008  . GOUT 08/01/2008  . ERECTILE DYSFUNCTION 08/01/2008  . PERIPHERAL NEUROPATHY 08/01/2008  . CORONARY ARTERY DISEASE 08/01/2008    non obstructive per cath 1/10; Dr. Juanda Chance  . 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 Gastroparesis 08/01/2008  . End stage renal failure on dialysis 08/01/2008  . NEPHROLITHIASIS, HX OF 08/01/2008  . Chronic systolic heart failure 06/21/2010  . COPD 07/21/2010  . Diverticulosis of colon (without mention of hemorrhage) 10/28/2010  . Benign neoplasm of colon 10/28/2010  . Hemorrhoids, internal 10/28/2010  . Renal cyst     bilateral  . Second degree Mobitz I AV block     asymptomatic  . Cyst     infected cyst on back   . Muscle spasm of back     back muscle spasms and pt is unable to lay flat   . Abscess of back     lower middle portion of back  . Headache(784.0)     HX; of migraines 25-30 years ago  . Cancer     bBasal cell on nose  . Peripheral vascular disease     poor circulation    Past Surgical History  Procedure Laterality Date  . Atrial ablation surgery  07-2008, 07/08/09    Dr Johney Frame  . Right foot  09-2009    infection  . Colonoscopy   43yrs ago    Dr.Orr  . Av fistula placement      left upper arm  . S/p right arm surgery after fracture    . Pilonidal cyst excision      s/p  . Cataract extraction      Hx: right eye  . Tonsillectomy    . I&d extremity Left 12/29/2012    Procedure: IRRIGATION AND DEBRIDEMENT EXTREMITY;  Surgeon: Nadara Mustard, MD;  Location: [facility] OR;  Service: Orthopedics;  Laterality: Left;  Irrigation and Debridement Left Leg, VAC, [place]in  . Toe amputation Right 01/18/2013    Dr [name]  . I&d extremity Bilateral 01/18/2013    Procedure: IRRIGATION AND DEBRIDEMENT EXTREMITYLEFT LEG AND RIGHT FOOT;  Surgeon: Nadara Mustard, MD;  Location: [facility] OR;  Service: Orthopedics;  Laterality: Bilateral;  Left Leg Excisional Debridement  . Skin graft Left     left leg within the past month  . Amputation Left 02/02/2013    Procedure: AMPUTATION BELOW KNEE;  Surgeon: Nadara Mustard, MD;  Location: [facility] OR;  Service: Orthopedics;  Laterality: Left;  Left Below Knee Amputation  . Fracture surgery    . Vasectomy    . Amputation Left 03/23/2013    Procedure: REVISION OF BELOW THE KNEE  AMPUTATION ;  Surgeon: Nadara Mustard, MD;  Location: [facility] OR;  Service: Orthopedics;  Laterality: Left;  Left Below Knee Amputation Revision  . I&d extremity Left 05/04/2013    Procedure: Irrigation and Debridement Left Below Knee Ampuation;  Surgeon: Nadara Mustard, MD;  Location: [facility] OR;  Service: Orthopedics;  Laterality: Left;  Irrigation and Debridement Left Below Knee Ampuation  . Av fistula placement Left     Family History  Problem Relation Age of Onset  . Diabetes Mother   . Diabetes Maternal Grandmother   . Diabetes Paternal Grandmother   . Cancer Neg Hx     no FH of Colon Cancer   Social History:  reports that he has been smoking Cigarettes.  He has a 10 pack-year smoking history. He has never used smokeless tobacco. He reports that he does not drink alcohol or use illicit drugs.  Allergies: No Known Allergies  No prescriptions  prior to admission    No results found for this or any previous visit (from the past 48 hour(s)). No results found.  Review of Systems  All other systems reviewed and are negative.    There were no vitals taken for this visit. Physical Exam  On examination there is a large gaped wound with complete dehiscence of the transtibial amputation Assessment/Plan Assessment: Dehiscence transtibial amputation status post recent fall with diabetes insensate neuropathy dialysis and smoking.  Plan: Discussed the patient most likely will require an above-the-knee amputation we will attempt revision of the transtibial amputation.  DUDA,MARCUS V 05/30/2013, 7:24 AM

## 2013-05-31 ENCOUNTER — Encounter (HOSPITAL_COMMUNITY): Payer: Self-pay | Admitting: Orthopedic Surgery

## 2013-05-31 LAB — RENAL FUNCTION PANEL
Albumin: 2.9 g/dL — ABNORMAL LOW (ref 3.5–5.2)
BUN: 40 mg/dL — ABNORMAL HIGH (ref 6–23)
Calcium: 9.1 mg/dL (ref 8.4–10.5)
Chloride: 98 mEq/L (ref 96–112)
GFR calc Af Amer: 8 mL/min — ABNORMAL LOW (ref >90)
GFR calc non Af Amer: 7 mL/min — ABNORMAL LOW (ref >90)
Potassium: 4.9 mEq/L (ref 3.5–5.1)

## 2013-05-31 LAB — GLUCOSE, CAPILLARY
Glucose-Capillary: 170 mg/dL — ABNORMAL HIGH (ref 70–99)
Glucose-Capillary: 142 mg/dL — ABNORMAL HIGH (ref 70–99)

## 2013-05-31 LAB — CBC
HCT: 27.1 % — ABNORMAL LOW (ref 39.0–52.0)
Hemoglobin: 8.1 g/dL — ABNORMAL LOW (ref 13.0–17.0)
RDW: 18.3 % — ABNORMAL HIGH (ref 11.5–15.5)
WBC: 9.7 10*3/uL (ref 4.0–10.5)

## 2013-05-31 LAB — PROTIME-INR: INR: 1.96 — ABNORMAL HIGH (ref 0.00–1.49)

## 2013-05-31 MED ORDER — DOXERCALCIFEROL 4 MCG/2ML IV SOLN
INTRAVENOUS | Status: AC
  Administered 2013-05-31: 2 ug
  Filled 2013-05-31: qty 2

## 2013-05-31 MED ORDER — DARBEPOETIN ALFA-POLYSORBATE 150 MCG/0.3ML IJ SOLN
150.0000 ug | INTRAMUSCULAR | Status: DC
  Administered 2013-05-31: 150 ug via INTRAVENOUS

## 2013-05-31 MED ORDER — WARFARIN SODIUM 2.5 MG PO TABS
2.5000 mg | ORAL_TABLET | Freq: Once | ORAL | Status: AC
  Administered 2013-05-31: 2.5 mg via ORAL
  Filled 2013-05-31: qty 1

## 2013-05-31 MED ORDER — DARBEPOETIN ALFA-POLYSORBATE 150 MCG/0.3ML IJ SOLN
INTRAMUSCULAR | Status: AC
  Filled 2013-05-31: qty 0.3

## 2013-05-31 NOTE — Progress Notes (Signed)
Patient ID: [name], male   DOB: 1951/02/10, 62 y.o.   MRN: [id number] Postoperative day one revision left transtibial amputation. Plan for dialysis today. Plan for discharge tomorrow.

## 2013-05-31 NOTE — Progress Notes (Signed)
Physical Therapy Note  Pt currently in HD.  Will try again another time.    [address] Kennie Snedden, [place] [phone]

## 2013-05-31 NOTE — Progress Notes (Signed)
ANTICOAGULATION CONSULT NOTE - Follow Up Consult  Pharmacy Consult for coumadin Indication: atrial fibrillation  No Known Allergies  Patient Measurements: Height: 6\' 1"  (185.4 cm) Weight: 229 lb 4.5 oz (104 kg) IBW/kg (Calculated) : 79.9   Vital Signs: Temp: 98.8 F (37.1 C) (12/18 0920) Temp src: Oral (12/18 0920) BP: 105/53 mmHg (12/18 1200) Pulse Rate: 86 (12/18 1200)  Labs:  Recent Labs  05/30/13 0905 05/30/13 0939 05/31/13 0428  HGB 12.2*  --  8.1*  HCT 36.0*  --  27.1*  PLT  --   --  371  LABPROT  --  18.2* 21.7*  INR  --  1.55* 1.96*  CREATININE  --   --  7.43*    Estimated Creatinine Clearance: 13 ml/min (by C-G formula based on Cr of 7.43).  Assessment: Patient is a 62 y.o m on coumadin for hx afib.  INR is almost at goal with 1.96 today.  No bleeding documented.  Goal of Therapy:  INR 2-3    Plan:  1) Coumadin 2.5mg  PO x1 today  Shaneen Reeser P 05/31/2013,2:07 PM

## 2013-05-31 NOTE — Progress Notes (Signed)
[facility] Progress Note  Subjective:   No complaints. Pain well controlled.   Objective Filed Vitals:   05/31/13 0942 05/31/13 0947 05/31/13 1000 05/31/13 1030  BP: 110/60 117/59 147/62 122/56  Pulse: 83 87 78 89  Temp:      TempSrc:      Resp: 17 16 16 17   Height:      Weight:      SpO2:       Physical Exam General: Alert, cooperative, NAD Heart: RRR Lungs: CTA bilat, no wheezes or rhonchi Abdomen: Soft, NT, non-distended, + BS Extremities: L BKA wrapped/ clean dressings, no LLE edema Dialysis Access: L AVF patent  Dialysis Orders: Center: AF on TTS.  EDW 104 kg HD Bath 2K/2.25Ca Time 4:15 Heparin 0 Access L AVF BFR 450 DFR A1.5  Hectorol 2 mcg IV/HD Epogen 20K Units IV/HD Venofer Load in progress, 100 mg IV/HD with 8 remaining doses then 50 mg weekly  Recent labs: Hgb 9.8, Tsat 11%, P 6, PTH 442  Assessment/Plan: 1. Left BKA revision: 12/17 per Dr. [name]. Home tomorrow per Ortho. 2. ESRD - TTS, HD today, No heparin 3. Hypertension/volume - BP controlled, No home BP meds. At edw here.   4. Anemia - Hgb  8.1 < 12.2. Post op drop anticipated. On high dose Epo as op. Aranesp 150 for today. Continue Fe load on op schedule.  5. Metabolic bone disease - Ca 9.1. P 6.9. Continue Phoslo/Fosrenol and Vit D 6. Nutrition - Renal diet, multivitamin 7. DM II - on insulin 8. Dispo - Home tomorrow per ortho.  Scot Jun. Broadus John, PA-C [facility] Pager [phone] 05/31/2013,11:14 AM  LOS: 1 day   I have seen and examined patient, discussed with PA and agree with assessment and plan as outlined above. Vinson Moselle MD pager [phone]    cell [phone] 05/31/2013, 1:37 PM    Additional Objective Labs: Basic Metabolic Panel:  Recent Labs Lab 05/30/13 0905 05/31/13 0428  NA 139 137  K 4.4 4.9  CL  --  98  CO2  --  25  GLUCOSE 246* 160*  BUN  --  40*  CREATININE  --  7.43*  CALCIUM  --  9.1  PHOS  --  6.9*   Liver Function Tests:  Recent  Labs Lab 05/31/13 0428  ALBUMIN 2.9*   CBC:  Recent Labs Lab 05/30/13 0905 05/31/13 0428  WBC  --  9.7  HGB 12.2* 8.1*  HCT 36.0* 27.1*  MCV  --  86.3  PLT  --  371   Blood Culture    Component Value Date/Time   SDES ABSCESS LEFT LEG 03/23/2013 1453   SPECREQUEST PATIENT ON FOLLOWING ANCEF ABSCES ON RIGHT BELOW THE KNEE AMPUTATION SITE 03/23/2013 1453   CULT  Value: FEW PROTEUS MIRABILIS Performed at [facility] 03/23/2013 1453   REPTSTATUS 03/26/2013 FINAL 03/23/2013 1453    CBG:  Recent Labs Lab 05/30/13 1053 05/30/13 1246 05/30/13 1608 05/30/13 2216 05/31/13 0639  GLUCAP 239* 218* 232* 149* 170*    Studies/Results: No results found. Medications: . sodium chloride 10 mL/hr (05/30/13 1243)   . allopurinol  100 mg Oral Daily  . calcium acetate  1,334 mg Oral TID WC  . darbepoetin (ARANESP) injection - DIALYSIS  150 mcg Intravenous Q Thu-HD  . doxercalciferol  2 mcg Intravenous Q T,Th,Sa-HD  . ferric gluconate (FERRLECIT/NULECIT) IV  125 mg Intravenous Q T,Th,Sa-HD  . gabapentin  300 mg Oral TID  . insulin aspart  0-9 Units Subcutaneous TID WC  . insulin aspart  3 Units Subcutaneous TID WC  . insulin glargine  25 Units Subcutaneous QHS  . lanthanum  2,000 mg Oral TID WC  . pantoprazole  40 mg Oral Daily  . temazepam  30 mg Oral QHS  . Warfarin - Pharmacist Dosing Inpatient   Does not apply [phone]

## 2013-05-31 NOTE — Progress Notes (Signed)
Physical Therapy Note  Pt continues to be off the floor at HD.  Will try back tomorrow.    [address] [name], [place] [phone]

## 2013-05-31 NOTE — Progress Notes (Signed)
UR completed 

## 2013-06-01 LAB — GLUCOSE, CAPILLARY
Glucose-Capillary: 173 mg/dL — ABNORMAL HIGH (ref 70–99)
Glucose-Capillary: 168 mg/dL — ABNORMAL HIGH (ref 70–99)

## 2013-06-01 MED ORDER — OXYCODONE-ACETAMINOPHEN 5-325 MG PO TABS: 1.0000 | ORAL_TABLET | ORAL | Status: DC | PRN

## 2013-06-01 NOTE — Progress Notes (Addendum)
Came to visit patient at beside. However, he had already been discharged. Came to visit on behalf of [facility] Care Management program for [employer] employees/dependents with [facility] insurance. Will follow up with post hospital discharge call.  [name], MSN- Ed, RN,BSN- [facility] [phone]

## 2013-06-01 NOTE — Discharge Summary (Signed)
Physician Discharge Summary  Patient ID: [name] MRN: [id number] DOB/AGE: 62-Dec-1952 62 y.o.  Admit date: 05/30/2013 Discharge date: 06/01/2013  Admission Diagnoses: Dehiscence left transtibial amputation  Discharge Diagnoses:  Active Problems:   BKA stump complication   Discharged Condition: stable  Hospital Course: Patient's hospital course was essentially unremarkable. He underwent revision left transtibial amputation. Postoperatively he underwent dialysis and was then discharged to home in stable condition  Consults: nephrology  Significant Diagnostic Studies: labs: Routine labs  Treatments: dialysis: Hemodialysis and surgery: See operative note  Discharge Exam: Blood pressure 118/57, pulse 96, temperature 99.2 F (37.3 C), temperature source Oral, resp. rate 18, height 6\' 1"  (1.854 m), weight 103 kg (227 lb 1.2 oz), SpO2 95.00%. Incision/Wound: dressing clean dry and intact  Disposition: 01-Home or Self Care  Discharge Orders   Future Orders Complete By Expires   Call MD / Call 911  As directed    Comments:     If you experience chest pain or shortness of breath, CALL 911 and be transported to the hospital emergency room.  If you develope a fever above 101 F, pus (white drainage) or increased drainage or redness at the wound, or calf pain, call your surgeon's office.   Constipation Prevention  As directed    Comments:     Drink plenty of fluids.  Prune juice may be helpful.  You may use a stool softener, such as Colace (over the counter) 100 mg twice a day.  Use MiraLax (over the counter) for constipation as needed.   Diet - low sodium heart healthy  As directed    Increase activity slowly as tolerated  As directed        Medication List         allopurinol 100 MG tablet  Commonly known as:  ZYLOPRIM  Take 100 mg by mouth daily.     calcium acetate 667 MG capsule  Commonly known as:  PHOSLO  Take 667 mg by mouth 3 (three) times daily with meals.     DIALYVITE PO  Take 100 mg by mouth daily.     doxycycline 100 MG tablet  Commonly known as:  VIBRA-TABS  Take 100 mg by mouth daily. For 1 month. Started 03/07/13     ethyl chloride spray  Apply 1 application topically daily as needed. For pain     FOSRENOL 1000 MG chewable tablet  Generic drug:  lanthanum  Chew 2 tablets by mouth 3 (three) times daily with meals.     gabapentin 300 MG capsule  Commonly known as:  NEURONTIN  Take 300 mg by mouth 3 (three) times daily.     HYDROcodone-acetaminophen 5-325 MG per tablet  Commonly known as:  NORCO/VICODIN  Take 1 tablet by mouth every 4 (four) hours as needed for pain.     insulin aspart 100 UNIT/ML injection  Commonly known as:  novoLOG  Inject 20 Units into the skin 3 (three) times daily before meals.     insulin glargine 100 UNIT/ML injection  Commonly known as:  LANTUS  Inject 25 Units into the skin at bedtime.     loperamide 2 MG capsule  Commonly known as:  IMODIUM  Take 2 mg by mouth 4 (four) times daily as needed for diarrhea or loose stools.     omeprazole 20 MG capsule  Commonly known as:  PRILOSEC  Take 20 mg by mouth daily.     oxyCODONE-acetaminophen 5-325 MG per tablet  Commonly known as:  ROXICET  Take 1 tablet by mouth every 4 (four) hours as needed for severe pain.     temazepam 30 MG capsule  Commonly known as:  RESTORIL  Take 30 mg by mouth at bedtime.     warfarin 5 MG tablet  Commonly known as:  COUMADIN  Take 2.5-5 mg by mouth daily. Take 5mg  daily except 2.5mg  on Tuesdays and Thursdays           Follow-up Information   Follow up with Marissa Lowrey V, MD In 2 weeks.   Specialty:  Orthopedic Surgery   Contact information:   [address] [place] [address] [phone]       Signed: Nadara Mustard 06/01/2013, 6:43 AM

## 2013-06-01 NOTE — Progress Notes (Signed)
Subjective:  No complaints, occasional left stump pain, insists he is not going to outpatient dialysis tomorrow.  Objective: Vital signs in last 24 hours: Temp:  [98.2 F (36.8 C)-99.2 F (37.3 C)] 99.2 F (37.3 C) (12/19 0502) Pulse Rate:  [78-96] 96 (12/19 0502) Resp:  [16-18] 18 (12/19 0502) BP: (104-147)/(53-72) 118/57 mmHg (12/19 0502) SpO2:  [93 %-95 %] 95 % (12/19 0502) Weight:  [103 kg (227 lb 1.2 oz)-104 kg (229 lb 4.5 oz)] 103 kg (227 lb 1.2 oz) (12/18 1357) Weight change: 0 kg (0 lb)  Intake/Output from previous day: 12/18 0701 - 12/19 0700 In: 580 [P.O.:480] Out: 1350 [Urine:350]   Lab Results:  Recent Labs  05/30/13 0905 05/31/13 0428  WBC  --  9.7  HGB 12.2* 8.1*  HCT 36.0* 27.1*  PLT  --  371   BMET:  Recent Labs  05/30/13 0905 05/31/13 0428  NA 139 137  K 4.4 4.9  CL  --  98  CO2  --  25  GLUCOSE 246* 160*  BUN  --  40*  CREATININE  --  7.43*  CALCIUM  --  9.1  ALBUMIN  --  2.9*   No results found for this basename: PTH,  in the last 72 hours Iron Studies: No results found for this basename: IRON, TIBC, TRANSFERRIN, FERRITIN,  in the last 72 hours  EXAM: General appearance:  Alert, in no apparent distress Resp:  CTA without rales, rhonchi, or wheezes Cardio:  RRR without murmur or rub GI:  + BS, soft and nontender Extremities:  L BKA wrapped, no edema Access:  AVF @ LUA with + bruit  Dialysis Orders: Center: [facility] on TTS.  EDW 104 kg HD Bath 2K/2.25Ca Time 4:15 Heparin 0 Access L AVF BFR 450 DFR A1.5  Hectorol 2 mcg IV/HD Epogen 20K Units IV/HD Venofer Load in progress, 100 mg IV/HD with 8 remaining doses then 50 mg weekly   Assessment/Plan: 1. Left BKA revision - sec to dehiscence sec to fall; s/p revision 12/17 per Dr. [name]. 2. ESRD - HD on TTS @ [facility]; K 4.9 pre-HD yesterday; pt insists he will not go to outpatient HD until 12/22, aware of consequences. 3. HTN/Volume - BP controlled, no meds; at EDW. 4. Anemia - Hgb 8.1 on Aranesp 150 mcg  on Thurs & IV Fe. 5. Sec HPT - Ca 9.1, P 6.9; Hectorol 2 mcg, Phoslo 2 with meals. 6. Nutrition - Alb 2.9, renal diet & vitamin. 7. DM - on insulin.   LOS: 2 days   [name],[name] 06/01/2013,9:02 AM  I have seen and examined patient, discussed with PA and agree with assessment and plan as outlined above. Vinson Moselle MD pager [phone]    cell [phone] 06/01/2013, 11:34 AM

## 2013-06-01 NOTE — Progress Notes (Signed)
Clinical Child psychotherapist (CSW) received consult for SNF placement. Per RN patient will D/C home. Please reconsult if further social work needs arise. CSW signing off.   Jetta Lout, LCSWA Weekend CSW [phone]

## 2013-06-01 NOTE — Progress Notes (Signed)
Pt is hemodynamically stable,  d/c home with daughter, d/c instructions reviewed with pt. No questions or concerns

## 2013-06-01 NOTE — Evaluation (Signed)
Physical Therapy Evaluation Patient Details Name: [name] MRN: [id number] DOB: 02-07-1951 Today's Date: 06/01/2013 Time: 1478-2956 PT Time Calculation (min): 23 min  PT Assessment / Plan / Recommendation History of Present Illness  Pt. underwent second revision of of Ltranstibial amputation originally done August '14.  Pt. fell and had dehiscence necessitating this revision.  Pt. is a Hd pt. on Tues, Thurs Sat and transports via  transport [place]  Clinical Impression  Pt. Presents to PT at or near his baseline level of function per his report and he is Dcing home this am.  He has all necessary equipment, but would benefit from HHPT safety eval, as he sustained a fall in his bathroom which necessitated this admission and surgical revision.  No further acute PT needs identified, therefore will sign off.    PT Assessment  All further PT needs can be met in the next venue of care    Follow Up Recommendations  Home health PT (safety eval; OPPT when he gets prosthesis)    Does the patient have the potential to tolerate intense rehabilitation      Barriers to Discharge        Equipment Recommendations  None recommended by PT    Recommendations for Other Services     Frequency      Precautions / Restrictions Precautions Precautions: Fall Restrictions Weight Bearing Restrictions: Yes LLE Weight Bearing: Non weight bearing Other Position/Activity Restrictions: L LE BKA   Pertinent Vitals/Pain See vitals tab       Mobility  Bed Mobility Bed Mobility: Supine to Sit;Sitting - Scoot to Edge of Bed Supine to Sit: 4: Min guard;Other (comment) (pt. says this bed id difficult for him to move in) Sitting - Scoot to Edge of Bed: 6: Modified independent (Device/Increase time) Details for Bed Mobility Assistance: cues to avoid using rails for eval purposes; needed slightly increased time but managing without external assist Transfers Transfers: Squat Pivot Transfers Squat Pivot  Transfers: With upper extremity assistance;4: Min guard Details for Transfer Assistance: Pt. needed min guard asssist for safety for transfer from bed to recliner with no dropped arm.  Pt. with good technique and apparent good strength for transfer to recliner with no drop arm Ambulation/Gait Ambulation/Gait Assistance: Not tested (comment);Other (comment) (Pt. declined to attempt) Corporate treasurer: No    Exercises Amputee Exercises Quad Sets: AROM;Left;10 reps;Seated Gluteal Sets: AROM;Left;10 reps;Seated   PT Diagnosis: Difficulty walking  PT Problem List: Decreased strength;Decreased activity tolerance;Decreased mobility;Decreased knowledge of use of DME;Decreased balance PT Treatment Interventions:       PT Goals(Current goals can be found in the care plan section) Acute Rehab PT Goals Patient Stated Goal: home and resume HD next week  Visit Information  Last PT Received On: 06/01/13 Assistance Needed: +1 History of Present Illness: Pt. underwent second revision of of Ltranstibial amputation originally done August '14.  Pt. fell and had dehiscence necessitating this revision.  Pt. is a Hd pt. on Tues, Thurs Sat and transports via  Educational psychologist       Prior Functioning  Home Living Family/patient expects to be discharged to:: Private residence Living Arrangements: Spouse/significant other Available Help at Discharge: Family;Available PRN/intermittently Type of Home: House Home Access: Stairs to enter [employer] of Steps: 6-7 (negotiates steps with electric chair lift) Entrance Stairs-Rails: Right Home Layout: One level Home Equipment: Walker - 2 wheels;Cane - single point;Crutches;Shower seat;Bedside commode;Toilet riser;Wheelchair - IT trainer Prior Function Level of Independence: Independent with assistive device(s) Comments:  reports he uses w/c 90% of the time Communication Communication: No difficulties    Cognition   Cognition Arousal/Alertness: Awake/alert Behavior During Therapy: WFL for tasks assessed/performed Overall Cognitive Status: Within Functional Limits for tasks assessed    Extremity/Trunk Assessment Upper Extremity Assessment Upper Extremity Assessment: Overall WFL for tasks assessed Lower Extremity Assessment Lower Extremity Assessment: LLE deficits/detail LLE Deficits / Details: able to fully extend knee and has good quad set. Cervical / Trunk Assessment Cervical / Trunk Assessment: Normal   Balance Balance Balance Assessed: Yes Static Sitting Balance Static Sitting - Balance Support: No upper extremity supported;Feet supported (foot supported) Static Sitting - Level of Assistance: 6: Modified independent (Device/Increase time) Dynamic Sitting Balance Dynamic Sitting - Balance Support: Bilateral upper extremity supported;During functional activity Dynamic Sitting - Level of Assistance: 5: Stand by assistance  End of Session PT - End of Session Equipment Utilized During Treatment: Gait belt Activity Tolerance: Patient tolerated treatment well Patient left: in chair;with call bell/phone within reach Nurse Communication: Mobility status  GP     Ferman Hamming 06/01/2013, 9:41 AM Weldon Picking PT Acute Rehab Services [phone] [place] [phone]

## 2013-06-04 ENCOUNTER — Telehealth: Payer: Self-pay | Admitting: *Deleted

## 2013-06-04 ENCOUNTER — Other Ambulatory Visit: Payer: Self-pay | Admitting: *Deleted

## 2013-06-04 LAB — PROTIME-INR: INR: 1.8 — AB (ref 0.9–1.1)

## 2013-06-04 MED ORDER — OMEPRAZOLE 20 MG PO CPDR: 20.0000 mg | DELAYED_RELEASE_CAPSULE | Freq: Every day | ORAL | Status: DC

## 2013-06-04 NOTE — Telephone Encounter (Signed)
ok 

## 2013-06-04 NOTE — Telephone Encounter (Signed)
[facility] called, they said patient is declining their services and wanted to make you aware.

## 2013-06-08 ENCOUNTER — Ambulatory Visit (INDEPENDENT_AMBULATORY_CARE_PROVIDER_SITE_OTHER): Payer: 59 | Admitting: Cardiology

## 2013-06-08 DIAGNOSIS — Z7901 Long term (current) use of anticoagulants: Secondary | ICD-10-CM

## 2013-06-08 DIAGNOSIS — I4891 Unspecified atrial fibrillation: Secondary | ICD-10-CM

## 2013-06-13 ENCOUNTER — Emergency Department (HOSPITAL_COMMUNITY): Payer: 59

## 2013-06-13 ENCOUNTER — Encounter (HOSPITAL_COMMUNITY): Payer: Self-pay | Admitting: Emergency Medicine

## 2013-06-13 ENCOUNTER — Inpatient Hospital Stay (HOSPITAL_COMMUNITY)
Admission: EM | Admit: 2013-06-13 | Discharge: 2013-06-17 | DRG: 193 | Disposition: A | Discharge status: Home / Self Care | Payer: 59 | Attending: Internal Medicine | Admitting: Internal Medicine

## 2013-06-13 DIAGNOSIS — Z79899 Other long term (current) drug therapy: Secondary | ICD-10-CM

## 2013-06-13 DIAGNOSIS — E785 Hyperlipidemia, unspecified: Secondary | ICD-10-CM | POA: Diagnosis present

## 2013-06-13 DIAGNOSIS — D638 Anemia in other chronic diseases classified elsewhere: Secondary | ICD-10-CM | POA: Diagnosis present

## 2013-06-13 DIAGNOSIS — N2581 Secondary hyperparathyroidism of renal origin: Secondary | ICD-10-CM | POA: Diagnosis present

## 2013-06-13 DIAGNOSIS — E118 Type 2 diabetes mellitus with unspecified complications: Secondary | ICD-10-CM | POA: Diagnosis present

## 2013-06-13 DIAGNOSIS — S88119A Complete traumatic amputation at level between knee and ankle, unspecified lower leg, initial encounter: Secondary | ICD-10-CM

## 2013-06-13 DIAGNOSIS — G8190 Hemiplegia, unspecified affecting unspecified side: Secondary | ICD-10-CM

## 2013-06-13 DIAGNOSIS — R51 Headache: Secondary | ICD-10-CM

## 2013-06-13 DIAGNOSIS — J441 Chronic obstructive pulmonary disease with (acute) exacerbation: Secondary | ICD-10-CM | POA: Diagnosis present

## 2013-06-13 DIAGNOSIS — N259 Disorder resulting from impaired renal tubular function, unspecified: Secondary | ICD-10-CM

## 2013-06-13 DIAGNOSIS — I4891 Unspecified atrial fibrillation: Secondary | ICD-10-CM | POA: Diagnosis present

## 2013-06-13 DIAGNOSIS — K648 Other hemorrhoids: Secondary | ICD-10-CM

## 2013-06-13 DIAGNOSIS — T8781 Dehiscence of amputation stump: Secondary | ICD-10-CM

## 2013-06-13 DIAGNOSIS — E1169 Type 2 diabetes mellitus with other specified complication: Secondary | ICD-10-CM

## 2013-06-13 DIAGNOSIS — I96 Gangrene, not elsewhere classified: Secondary | ICD-10-CM

## 2013-06-13 DIAGNOSIS — I471 Supraventricular tachycardia: Secondary | ICD-10-CM

## 2013-06-13 DIAGNOSIS — E291 Testicular hypofunction: Secondary | ICD-10-CM

## 2013-06-13 DIAGNOSIS — K3184 Gastroparesis: Secondary | ICD-10-CM

## 2013-06-13 DIAGNOSIS — Z794 Long term (current) use of insulin: Secondary | ICD-10-CM

## 2013-06-13 DIAGNOSIS — R197 Diarrhea, unspecified: Secondary | ICD-10-CM

## 2013-06-13 DIAGNOSIS — F528 Other sexual dysfunction not due to a substance or known physiological condition: Secondary | ICD-10-CM

## 2013-06-13 DIAGNOSIS — Z992 Dependence on renal dialysis: Secondary | ICD-10-CM

## 2013-06-13 DIAGNOSIS — Z01810 Encounter for preprocedural cardiovascular examination: Secondary | ICD-10-CM

## 2013-06-13 DIAGNOSIS — Z125 Encounter for screening for malignant neoplasm of prostate: Secondary | ICD-10-CM

## 2013-06-13 DIAGNOSIS — M109 Gout, unspecified: Secondary | ICD-10-CM | POA: Diagnosis present

## 2013-06-13 DIAGNOSIS — R3129 Other microscopic hematuria: Secondary | ICD-10-CM

## 2013-06-13 DIAGNOSIS — K219 Gastro-esophageal reflux disease without esophagitis: Secondary | ICD-10-CM | POA: Diagnosis present

## 2013-06-13 DIAGNOSIS — I2510 Atherosclerotic heart disease of native coronary artery without angina pectoris: Secondary | ICD-10-CM | POA: Diagnosis present

## 2013-06-13 DIAGNOSIS — I5022 Chronic systolic (congestive) heart failure: Secondary | ICD-10-CM | POA: Diagnosis present

## 2013-06-13 DIAGNOSIS — T879 Unspecified complications of amputation stump: Secondary | ICD-10-CM

## 2013-06-13 DIAGNOSIS — N186 End stage renal disease: Secondary | ICD-10-CM | POA: Diagnosis present

## 2013-06-13 DIAGNOSIS — Z85828 Personal history of other malignant neoplasm of skin: Secondary | ICD-10-CM

## 2013-06-13 DIAGNOSIS — N189 Chronic kidney disease, unspecified: Secondary | ICD-10-CM

## 2013-06-13 DIAGNOSIS — I4892 Unspecified atrial flutter: Secondary | ICD-10-CM

## 2013-06-13 DIAGNOSIS — I120 Hypertensive chronic kidney disease with stage 5 chronic kidney disease or end stage renal disease: Secondary | ICD-10-CM | POA: Diagnosis present

## 2013-06-13 DIAGNOSIS — F17200 Nicotine dependence, unspecified, uncomplicated: Secondary | ICD-10-CM | POA: Diagnosis present

## 2013-06-13 DIAGNOSIS — I10 Essential (primary) hypertension: Secondary | ICD-10-CM

## 2013-06-13 DIAGNOSIS — E1029 Type 1 diabetes mellitus with other diabetic kidney complication: Secondary | ICD-10-CM

## 2013-06-13 DIAGNOSIS — J449 Chronic obstructive pulmonary disease, unspecified: Secondary | ICD-10-CM

## 2013-06-13 DIAGNOSIS — D126 Benign neoplasm of colon, unspecified: Secondary | ICD-10-CM

## 2013-06-13 DIAGNOSIS — I4821 Permanent atrial fibrillation: Secondary | ICD-10-CM | POA: Diagnosis present

## 2013-06-13 DIAGNOSIS — E669 Obesity, unspecified: Secondary | ICD-10-CM | POA: Diagnosis present

## 2013-06-13 DIAGNOSIS — G609 Hereditary and idiopathic neuropathy, unspecified: Secondary | ICD-10-CM | POA: Diagnosis present

## 2013-06-13 DIAGNOSIS — L97909 Non-pressure chronic ulcer of unspecified part of unspecified lower leg with unspecified severity: Secondary | ICD-10-CM

## 2013-06-13 DIAGNOSIS — Z87442 Personal history of urinary calculi: Secondary | ICD-10-CM

## 2013-06-13 DIAGNOSIS — E1142 Type 2 diabetes mellitus with diabetic polyneuropathy: Secondary | ICD-10-CM | POA: Diagnosis present

## 2013-06-13 DIAGNOSIS — I441 Atrioventricular block, second degree: Secondary | ICD-10-CM | POA: Diagnosis present

## 2013-06-13 DIAGNOSIS — G4700 Insomnia, unspecified: Secondary | ICD-10-CM

## 2013-06-13 DIAGNOSIS — S98139A Complete traumatic amputation of one unspecified lesser toe, initial encounter: Secondary | ICD-10-CM

## 2013-06-13 DIAGNOSIS — Z7901 Long term (current) use of anticoagulants: Secondary | ICD-10-CM

## 2013-06-13 DIAGNOSIS — M899 Disorder of bone, unspecified: Secondary | ICD-10-CM | POA: Diagnosis present

## 2013-06-13 DIAGNOSIS — E1059 Type 1 diabetes mellitus with other circulatory complications: Secondary | ICD-10-CM

## 2013-06-13 DIAGNOSIS — J189 Pneumonia, unspecified organism: Principal | ICD-10-CM | POA: Diagnosis present

## 2013-06-13 DIAGNOSIS — K5730 Diverticulosis of large intestine without perforation or abscess without bleeding: Secondary | ICD-10-CM

## 2013-06-13 DIAGNOSIS — E1149 Type 2 diabetes mellitus with other diabetic neurological complication: Secondary | ICD-10-CM | POA: Diagnosis present

## 2013-06-13 DIAGNOSIS — E119 Type 2 diabetes mellitus without complications: Secondary | ICD-10-CM

## 2013-06-13 HISTORY — DX: Pneumonia, unspecified organism: J18.9

## 2013-06-13 HISTORY — DX: Type 2 diabetes mellitus without complications: E11.9

## 2013-06-13 HISTORY — DX: Migraine, unspecified, not intractable, without status migrainosus: G43.909

## 2013-06-13 HISTORY — DX: Basal cell carcinoma of skin, unspecified: C44.91

## 2013-06-13 HISTORY — DX: Shortness of breath: R06.02

## 2013-06-13 LAB — CBC WITH DIFFERENTIAL/PLATELET
Basophils Absolute: 0.1 10*3/uL (ref 0.0–0.1)
Eosinophils Relative: 0 % (ref 0–5)
HCT: 35 % — ABNORMAL LOW (ref 39.0–52.0)
Hemoglobin: 10.2 g/dL — ABNORMAL LOW (ref 13.0–17.0)
Lymphocytes Relative: 21 % (ref 12–46)
Lymphs Abs: 1.9 10*3/uL (ref 0.7–4.0)
MCV: 87.7 fL (ref 78.0–100.0)
Monocytes Absolute: 0.5 10*3/uL (ref 0.1–1.0)
Monocytes Relative: 6 % (ref 3–12)
Platelets: 384 10*3/uL (ref 150–400)
RBC: 3.99 MIL/uL — ABNORMAL LOW (ref 4.22–5.81)
RDW: 18.7 % — ABNORMAL HIGH (ref 11.5–15.5)
WBC: 9.1 10*3/uL (ref 4.0–10.5)

## 2013-06-13 LAB — CREATININE, SERUM
GFR calc Af Amer: 8 mL/min — ABNORMAL LOW (ref >90)
GFR calc non Af Amer: 7 mL/min — ABNORMAL LOW (ref >90)

## 2013-06-13 LAB — INFLUENZA PANEL BY PCR (TYPE A & B)
H1N1 flu by pcr: NOT DETECTED
Influenza A By PCR: NEGATIVE
Influenza B By PCR: NEGATIVE

## 2013-06-13 LAB — POCT I-STAT, CHEM 8
BUN: 43 mg/dL — ABNORMAL HIGH (ref 6–23)
Calcium, Ion: 1.12 mmol/L — ABNORMAL LOW (ref 1.13–1.30)
Chloride: 99 mEq/L (ref 96–112)
Creatinine, Ser: 7.2 mg/dL — ABNORMAL HIGH (ref 0.50–1.35)
Glucose, Bld: 210 mg/dL — ABNORMAL HIGH (ref 70–99)
Sodium: 138 mEq/L (ref 137–147)
TCO2: 25 mmol/L (ref 0–100)

## 2013-06-13 MED ORDER — HYDROCODONE-ACETAMINOPHEN 5-325 MG PO TABS
1.0000 | ORAL_TABLET | ORAL | Status: DC | PRN
  Administered 2013-06-13: 1 via ORAL
  Administered 2013-06-14: 2 via ORAL
  Administered 2013-06-15: 1 via ORAL
  Administered 2013-06-16: 2 via ORAL
  Filled 2013-06-13: qty 2
  Filled 2013-06-13: qty 1
  Filled 2013-06-13 (×2): qty 2

## 2013-06-13 MED ORDER — MORPHINE SULFATE 2 MG/ML IJ SOLN: 2.0000 mg | INTRAMUSCULAR | Status: DC | PRN

## 2013-06-13 MED ORDER — HEPARIN SODIUM (PORCINE) 5000 UNIT/ML IJ SOLN
5000.0000 [IU] | Freq: Three times a day (TID) | INTRAMUSCULAR | Status: DC
  Filled 2013-06-13 (×5): qty 1

## 2013-06-13 MED ORDER — GABAPENTIN 300 MG PO CAPS
300.0000 mg | ORAL_CAPSULE | Freq: Every day | ORAL | Status: DC
  Administered 2013-06-13 – 2013-06-16 (×4): 300 mg via ORAL
  Filled 2013-06-13 (×6): qty 1

## 2013-06-13 MED ORDER — INSULIN GLARGINE 100 UNIT/ML ~~LOC~~ SOLN
25.0000 [IU] | Freq: Every day | SUBCUTANEOUS | Status: DC
  Administered 2013-06-14 – 2013-06-16 (×4): 25 [IU] via SUBCUTANEOUS
  Filled 2013-06-13 (×6): qty 0.25

## 2013-06-13 MED ORDER — INSULIN ASPART 100 UNIT/ML ~~LOC~~ SOLN
20.0000 [IU] | Freq: Three times a day (TID) | SUBCUTANEOUS | Status: DC
  Administered 2013-06-14 – 2013-06-17 (×6): 20 [IU] via SUBCUTANEOUS

## 2013-06-13 MED ORDER — PANTOPRAZOLE SODIUM 40 MG PO TBEC
40.0000 mg | DELAYED_RELEASE_TABLET | Freq: Every day | ORAL | Status: DC
  Administered 2013-06-13 – 2013-06-17 (×5): 40 mg via ORAL
  Filled 2013-06-13 (×5): qty 1

## 2013-06-13 MED ORDER — VANCOMYCIN HCL IN DEXTROSE 1-5 GM/200ML-% IV SOLN
1000.0000 mg | INTRAVENOUS | Status: DC
  Filled 2013-06-13: qty 200

## 2013-06-13 MED ORDER — DEXTROSE 5 % IV SOLN
2.0000 g | Freq: Once | INTRAVENOUS | Status: AC
  Administered 2013-06-13: 2 g via INTRAVENOUS
  Filled 2013-06-13: qty 2

## 2013-06-13 MED ORDER — DOXERCALCIFEROL 4 MCG/2ML IV SOLN
2.0000 ug | INTRAVENOUS | Status: DC
  Administered 2013-06-16: 2 ug via INTRAVENOUS
  Filled 2013-06-13 (×3): qty 2

## 2013-06-13 MED ORDER — ONDANSETRON HCL 4 MG PO TABS: 4.0000 mg | ORAL_TABLET | Freq: Four times a day (QID) | ORAL | Status: DC | PRN

## 2013-06-13 MED ORDER — LANTHANUM CARBONATE 500 MG PO CHEW
2000.0000 mg | CHEWABLE_TABLET | Freq: Three times a day (TID) | ORAL | Status: DC
  Administered 2013-06-14 – 2013-06-17 (×8): 2000 mg via ORAL
  Filled 2013-06-13 (×16): qty 4

## 2013-06-13 MED ORDER — DEXTROSE 5 % IV SOLN: 1.0000 g | Freq: Three times a day (TID) | INTRAVENOUS | Status: DC

## 2013-06-13 MED ORDER — TEMAZEPAM 15 MG PO CAPS
30.0000 mg | ORAL_CAPSULE | Freq: Every day | ORAL | Status: DC
  Administered 2013-06-13 – 2013-06-16 (×4): 30 mg via ORAL
  Filled 2013-06-13 (×4): qty 2

## 2013-06-13 MED ORDER — DEXTROSE 5 % IV SOLN
2.0000 g | INTRAVENOUS | Status: DC
  Filled 2013-06-13: qty 2

## 2013-06-13 MED ORDER — VANCOMYCIN HCL 1000 MG IV SOLR
2300.0000 mg | Freq: Once | INTRAVENOUS | Status: DC
  Filled 2013-06-13: qty 2300

## 2013-06-13 MED ORDER — RENA-VITE PO TABS
1.0000 | ORAL_TABLET | Freq: Every day | ORAL | Status: DC
  Administered 2013-06-13 – 2013-06-15 (×3): 1 via ORAL
  Administered 2013-06-16: 22:00:00 via ORAL
  Filled 2013-06-13 (×5): qty 1

## 2013-06-13 MED ORDER — ONDANSETRON HCL 4 MG/2ML IJ SOLN: 4.0000 mg | Freq: Four times a day (QID) | INTRAMUSCULAR | Status: DC | PRN

## 2013-06-13 MED ORDER — INSULIN ASPART 100 UNIT/ML ~~LOC~~ SOLN
0.0000 [IU] | Freq: Three times a day (TID) | SUBCUTANEOUS | Status: DC
  Administered 2013-06-14: 3 [IU] via SUBCUTANEOUS
  Administered 2013-06-14: 15 [IU] via SUBCUTANEOUS
  Administered 2013-06-15 – 2013-06-16 (×2): 3 [IU] via SUBCUTANEOUS
  Administered 2013-06-17: 15 [IU] via SUBCUTANEOUS

## 2013-06-13 MED ORDER — WARFARIN SODIUM 7.5 MG PO TABS
7.5000 mg | ORAL_TABLET | Freq: Once | ORAL | Status: AC
  Administered 2013-06-13: 7.5 mg via ORAL
  Filled 2013-06-13: qty 1

## 2013-06-13 MED ORDER — DARBEPOETIN ALFA-POLYSORBATE 150 MCG/0.3ML IJ SOLN: 150.0000 ug | INTRAMUSCULAR | Status: DC

## 2013-06-13 MED ORDER — OXYCODONE-ACETAMINOPHEN 5-325 MG PO TABS: 1.0000 | ORAL_TABLET | ORAL | Status: DC | PRN

## 2013-06-13 MED ORDER — WARFARIN - PHARMACIST DOSING INPATIENT
Freq: Every day | Status: DC
  Administered 2013-06-15: 18:00:00

## 2013-06-13 MED ORDER — ALLOPURINOL 100 MG PO TABS
100.0000 mg | ORAL_TABLET | Freq: Every day | ORAL | Status: DC
  Administered 2013-06-13 – 2013-06-16 (×4): 100 mg via ORAL
  Filled 2013-06-13 (×5): qty 1

## 2013-06-13 MED ORDER — OSELTAMIVIR PHOSPHATE 30 MG PO CAPS
30.0000 mg | ORAL_CAPSULE | Freq: Once | ORAL | Status: AC
  Administered 2013-06-13: 30 mg via ORAL
  Filled 2013-06-13: qty 1

## 2013-06-13 MED ORDER — ACETAMINOPHEN 325 MG PO TABS: 650.0000 mg | ORAL_TABLET | Freq: Four times a day (QID) | ORAL | Status: DC | PRN

## 2013-06-13 MED ORDER — VANCOMYCIN HCL 10 G IV SOLR
2000.0000 mg | Freq: Once | INTRAVENOUS | Status: DC
  Filled 2013-06-13: qty 2000

## 2013-06-13 MED ORDER — ACETAMINOPHEN 650 MG RE SUPP: 650.0000 mg | Freq: Four times a day (QID) | RECTAL | Status: DC | PRN

## 2013-06-13 MED ORDER — VANCOMYCIN HCL 1000 MG IV SOLR
2000.0000 mg | Freq: Once | INTRAVENOUS | Status: DC
  Filled 2013-06-13: qty 2000

## 2013-06-13 MED ORDER — LOPERAMIDE HCL 2 MG PO CAPS
2.0000 mg | ORAL_CAPSULE | Freq: Every day | ORAL | Status: DC | PRN
  Filled 2013-06-13: qty 1

## 2013-06-13 MED ORDER — INSULIN ASPART 100 UNIT/ML ~~LOC~~ SOLN
0.0000 [IU] | Freq: Every day | SUBCUTANEOUS | Status: DC
  Administered 2013-06-14: 2 [IU] via SUBCUTANEOUS
  Administered 2013-06-14: 4 [IU] via SUBCUTANEOUS
  Administered 2013-06-15: 3 [IU] via SUBCUTANEOUS
  Administered 2013-06-16: 5 [IU] via SUBCUTANEOUS

## 2013-06-13 MED ORDER — IPRATROPIUM-ALBUTEROL 0.5-2.5 (3) MG/3ML IN SOLN
3.0000 mL | RESPIRATORY_TRACT | Status: DC
  Administered 2013-06-13 – 2013-06-14 (×2): 3 mL via RESPIRATORY_TRACT
  Filled 2013-06-13 (×44): qty 3

## 2013-06-13 MED ORDER — LANTHANUM CARBONATE 500 MG PO CHEW: 2000.0000 mg | CHEWABLE_TABLET | Freq: Three times a day (TID) | ORAL | Status: DC

## 2013-06-13 MED ORDER — OSELTAMIVIR PHOSPHATE 75 MG PO CAPS: 75.0000 mg | ORAL_CAPSULE | Freq: Once | ORAL | Status: DC

## 2013-06-13 MED ORDER — WARFARIN SODIUM 5 MG PO TABS: 5.0000 mg | ORAL_TABLET | Freq: Every day | ORAL | Status: DC

## 2013-06-13 MED ORDER — CALCIUM ACETATE 667 MG PO CAPS
667.0000 mg | ORAL_CAPSULE | Freq: Three times a day (TID) | ORAL | Status: DC
  Administered 2013-06-14 – 2013-06-17 (×8): 667 mg via ORAL
  Filled 2013-06-13 (×15): qty 1

## 2013-06-13 MED ORDER — SODIUM CHLORIDE 0.9 % IV SOLN
62.5000 mg | INTRAVENOUS | Status: DC
  Administered 2013-06-14 – 2013-06-16 (×2): 62.5 mg via INTRAVENOUS
  Filled 2013-06-13 (×4): qty 5

## 2013-06-13 NOTE — H&P (Signed)
[facility] History and Physical  [name] ZOX:[id number] DOB: 08/26/1950 DOA: 06/13/2013  Referring physician: Emergency department PCP: Romero Belling, MD  Specialists:   Chief Complaint: SOB  HPI: [name] is a 62 y.o. male  With a hx of dm, htn, ESRD on MWF HD, active tobacco abuse, and afib with coumadin who presents to the ED with 2-3 days hx of worsening cough and SOB. Pt reported marked improvement with neb tx and steroids en route to the ED. In the ED, the patient was noted to have radiographic evidence suggesting a RUL pneumonia. Hosptialist was consulted for admission.  Review of Systems:  Per above, the remainder of the 10pt ros reviewed and are neg  Past Medical History  Diagnosis Date  . Chronic kidney disease     dialysis t,th,sat  . GERD (gastroesophageal reflux disease)   . Hypertension   . Neuromuscular disorder     neurophathy  . Atrial fibrillation     chronically anticoagulated with coumadin  . Hyperlipidemia   . Atrial flutter 07/07/2009    s/p ablation  . DIABETES MELLITUS, TYPE II 08/01/2008  . GOUT 08/01/2008  . ERECTILE DYSFUNCTION 08/01/2008  . PERIPHERAL NEUROPATHY 08/01/2008  . CORONARY ARTERY DISEASE 08/01/2008    non obstructive per cath 1/10; Dr. Juanda Chance  . 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 Gastroparesis 08/01/2008  . End stage renal failure on dialysis 08/01/2008  . NEPHROLITHIASIS, HX OF 08/01/2008  . Chronic systolic heart failure 06/21/2010  . COPD 07/21/2010  . Diverticulosis of colon (without mention of hemorrhage) 10/28/2010  . Benign neoplasm of colon 10/28/2010  . Hemorrhoids, internal 10/28/2010  . Renal cyst     bilateral  . Second degree Mobitz I AV block     asymptomatic  . Cyst     infected cyst on back   . Muscle spasm of back     back muscle spasms and pt is unable to lay flat   . Abscess of back     lower middle portion of back  . Headache(784.0)     HX; of migraines 25-30 years ago  .  Cancer     bBasal cell on nose  . Peripheral vascular disease     poor circulation   Past Surgical History  Procedure Laterality Date  . Atrial ablation surgery  07-2008, 07/08/09    Dr Johney Frame  . Right foot  09-2009    infection  . Colonoscopy  53yrs ago    Dr.Orr  . Av fistula placement      left upper arm  . S/p right arm surgery after fracture    . Pilonidal cyst excision      s/p  . Cataract extraction      Hx: right eye  . Tonsillectomy    . I&d extremity Left 12/29/2012    Procedure: IRRIGATION AND DEBRIDEMENT EXTREMITY;  Surgeon: Nadara Mustard, MD;  Location: [facility] OR;  Service: Orthopedics;  Laterality: Left;  Irrigation and Debridement Left Leg, VAC, [place]  . Toe amputation Right 01/18/2013    Dr [name]  . I&d extremity Bilateral 01/18/2013    Procedure: IRRIGATION AND DEBRIDEMENT EXTREMITYLEFT LEG AND RIGHT FOOT;  Surgeon: Nadara Mustard, MD;  Location: [facility] OR;  Service: Orthopedics;  Laterality: Bilateral;  Left Leg Excisional Debridement  . Skin graft Left     left leg within the past month  . Amputation Left 02/02/2013    Procedure: AMPUTATION  BELOW KNEE;  Surgeon: Nadara Mustard, MD;  Location: [facility];  Service: Orthopedics;  Laterality: Left;  Left Below Knee Amputation  . Fracture surgery    . Vasectomy    . Amputation Left 03/23/2013    Procedure: REVISION OF BELOW THE KNEE  AMPUTATION ;  Surgeon: Nadara Mustard, MD;  Location: [facility] OR;  Service: Orthopedics;  Laterality: Left;  Left Below Knee Amputation Revision  . I&d extremity Left 05/04/2013    Procedure: Irrigation and Debridement Left Below Knee Ampuation;  Surgeon: Nadara Mustard, MD;  Location: [facility] OR;  Service: Orthopedics;  Laterality: Left;  Irrigation and Debridement Left Below Knee Ampuation  . Av fistula placement Left   . Below knee leg amputation Left 05/30/2013    DR DUDA  . Amputation Left 05/30/2013    Procedure: REVISION AMPUTATION BELOW KNEE;  Surgeon: Nadara Mustard, MD;  Location: [facility] OR;  Service:  Orthopedics;  Laterality: Left;  Revision Left Below Knee Amputation, Possible Above Knee Amputation   Social History:  reports that he has been smoking Cigarettes.  He has a 10 pack-year smoking history. He has never used smokeless tobacco. He reports that he does not drink alcohol or use illicit drugs.  where does patient live--home, ALF, SNF? and with whom if at home?  Can patient participate in ADLs?  No Known Allergies  Family History  Problem Relation Age of Onset  . Diabetes Mother   . Diabetes Maternal Grandmother   . Diabetes Paternal Grandmother   . Cancer Neg Hx     no FH of Colon Cancer    (be sure to complete)  Prior to Admission medications   Medication Sig Start Date End Date Taking? Authorizing Provider  allopurinol (ZYLOPRIM) 100 MG tablet Take 100 mg by mouth at bedtime.  07/30/10  Yes Historical Provider, MD  B Complex-C-Folic Acid (DIALYVITE PO) Take 100 mg by mouth daily.     Yes Historical Provider, MD  calcium acetate (PHOSLO) 667 MG capsule Take 667 mg by mouth 3 (three) times daily with meals.   Yes Historical Provider, MD  doxycycline (VIBRA-TABS) 100 MG tablet Take 100 mg by mouth daily. For 1 month. Started 03/07/13   Yes Historical Provider, MD  ethyl chloride spray Apply 1 application topically daily as needed. For pain 11/01/11  Yes Historical Provider, MD  FOSRENOL 1000 MG chewable tablet Chew 2 tablets by mouth 3 (three) times daily with meals.  09/24/10  Yes Historical Provider, MD  gabapentin (NEURONTIN) 300 MG capsule Take 300 mg by mouth at bedtime.    Yes Historical Provider, MD  HYDROcodone-acetaminophen (NORCO/VICODIN) 5-325 MG per tablet Take 1 tablet by mouth every 4 (four) hours as needed for pain.   Yes Historical Provider, MD  insulin aspart (NOVOLOG) 100 UNIT/ML injection Inject 20 Units into the skin 3 (three) times daily with meals.  06/19/12 06/19/13 Yes Romero Belling, MD  insulin glargine (LANTUS) 100 UNIT/ML injection Inject 25 Units into the skin  at bedtime.    Yes Historical Provider, MD  loperamide (IMODIUM) 2 MG capsule Take 2 mg by mouth daily as needed for diarrhea or loose stools.    Yes Historical Provider, MD  omeprazole (PRILOSEC) 20 MG capsule Take 1 capsule (20 mg total) by mouth daily. 06/04/13  Yes Romero Belling, MD  oxyCODONE-acetaminophen (ROXICET) 5-325 MG per tablet Take 1 tablet by mouth every 4 (four) hours as needed for severe pain. 06/01/13  Yes Nadara Mustard, MD  temazepam (  RESTORIL) 30 MG capsule Take 30 mg by mouth at bedtime.   Yes Historical Provider, MD  warfarin (COUMADIN) 5 MG tablet Take 5 mg by mouth at bedtime.    Yes Historical Provider, MD   Physical Exam: Filed Vitals:   06/13/13 1200 06/13/13 1215 06/13/13 1230 06/13/13 1312  BP: 141/57 141/60 132/76   Pulse: 96 93 95   Temp:    98.1 F (36.7 C)  TempSrc:    Rectal  Resp: 17 24 23    SpO2: 94% 92% 91%      General:  Awake, in nad  Eyes: PERRL B  ENT: membranes moist, dentition fair  Neck: trachea midline, neck supple  Cardiovascular: regular, s1, s2  Respiratory: normal resp effort, no wheezing  Abdomen: soft, nondistended   Skin: normal skin turgor, no abnormal skin lesions seen  Musculoskeletal: perfused, no clubbing  Psychiatric: mood/affect normal // no auditory/visual hallucinations  Neurologic: cn2-12 grossly intact, strength/sensation intact  Labs on Admission:  Basic Metabolic Panel:  Recent Labs Lab 06/13/13 1243  NA 138  K 4.0  CL 99  GLUCOSE 210*  BUN 43*  CREATININE 7.20*   Liver Function Tests: No results found for this basename: AST, ALT, ALKPHOS, BILITOT, PROT, ALBUMIN,  in the last 168 hours No results found for this basename: LIPASE, AMYLASE,  in the last 168 hours No results found for this basename: AMMONIA,  in the last 168 hours CBC:  Recent Labs Lab 06/13/13 1219 06/13/13 1243  WBC 9.1  --   NEUTROABS 6.6  --   HGB 10.2* 12.9*  HCT 35.0* 38.0*  MCV 87.7  --   PLT 384  --    Cardiac  Enzymes: No results found for this basename: CKTOTAL, CKMB, CKMBINDEX, TROPONINI,  in the last 168 hours  BNP (last 3 results) No results found for this basename: PROBNP,  in the last 8760 hours CBG: No results found for this basename: GLUCAP,  in the last 168 hours  Radiological Exams on Admission: Dg Chest 2 View  06/13/2013   CLINICAL DATA:  Cough, shortness of Breath  EXAM: CHEST  2 VIEW  COMPARISON:  12/29/2012  FINDINGS: Cardiomediastinal silhouette is stable. Central mild bronchitic changes with worsening from prior exam. No pulmonary edema. Mild increased interstitial markings in right upper lobe. Atypical pneumonitis cannot be excluded.  IMPRESSION: Central mild bronchitic changes with worsening from prior exam. No pulmonary edema. Mild increased interstitial markings in right upper lobe. Atypical pneumonitis cannot be excluded.   Electronically Signed   By: Natasha Mead M.D.   On: 06/13/2013 13:03     Assessment/Plan Principal Problem:   COPD Active Problems:   DIABETES MELLITUS, TYPE II   Atrial fibrillation   Chronic systolic heart failure   ESRD on dialysis   1. HCAP 1. Recently hospitalized earlier this past month by the surgical service for dehiscence of L transtibial amputation 2. Will cont on vanc and cefepime 3. Afebrile 4. No leukocytosis 5. Admit to med-surg 2. DM 1. Cont on SSI coverage 3. Afib 1. Rate controlled 2. Cont coumadin per pharmacy 4. ESRD 1. Normally TTS, but is MWS this week for the holidays 2. Due for HD today 3. Will consult Nephrology 5. DVT prophylaxis 1. On therapeutic coumadin 6. Tobacco abuse 1. tobacco cessation done face to face   Code Status: Full (must indicate code status--if unknown or must be presumed, indicate so) Family Communication: Pt and his wife and daughter in room (indicate person spoken  with, if applicable, with phone number if by telephone) Disposition Plan: Pending (indicate anticipated LOS)  Time spent:   Willisha Sligar K [facility] Pager [phone]  If 7PM-7AM, please contact night-coverage [email or web address] Password [email or web address] 06/13/2013, 2:45 PM

## 2013-06-13 NOTE — ED Notes (Signed)
Patient transported to X-ray 

## 2013-06-13 NOTE — ED Notes (Signed)
MD at bedside.Adm

## 2013-06-13 NOTE — ED Notes (Signed)
Pt from home c/o sob. Pt recieved 2 nebs and 125 solumedrol pta by ems.. Pt is dialysis pt, m/w/f. Last dialysis on mon.

## 2013-06-13 NOTE — Progress Notes (Signed)
Late Entry  Admission note:   Arrival Method: Via stretcher from ED. Mental Status: A&Ox4 Telemetry: N/A  Skin: Not assessed at this time.  Tubes: N/A IV: LAC 12/31 some bruising noted. Pain: Denies.  Family: Spouse at bedside.  Living Situation: Home with wife. Safety Measures: Call bell within reach. 6E Orientation: Oriented to unit and surroundings.    Patient arrived to unit at approx 1630. Patient placed on droplet precautions for r/o flu. Patient ordered supper tray. Waiting to be picked up to go to dialysis.  Kathlene November, Tameron Lama Salineno

## 2013-06-13 NOTE — ED Provider Notes (Signed)
CSN: [id number]     Arrival date & time 06/13/13  1144 History   First MD Initiated Contact with Patient 06/13/13 1154     Chief Complaint  Patient presents with  . Shortness of Breath   (Consider location/radiation/quality/duration/timing/severity/associated sxs/prior Treatment) Patient is a 62 y.o. male presenting with shortness of breath.  Shortness of Breath Associated symptoms: cough    Place of cough and shortness of breath cough productive of white sputum onset 2 days ago. Symptoms by chills, subjective fever. No other associated symptoms.. Nothing makes symptoms better or worse. Patient last hemodialysis session was 2 days ago, states her entire session. He was treated by EMS with 2 albuterol nebulized treatments and Solu-Medrol intravenously with improvement of breathing and cough. Past Medical History  Diagnosis Date  . Chronic kidney disease     dialysis t,th,sat  . GERD (gastroesophageal reflux disease)   . Hypertension   . Neuromuscular disorder     neurophathy  . Atrial fibrillation     chronically anticoagulated with coumadin  . Hyperlipidemia   . Atrial flutter 07/07/2009    s/p ablation  . DIABETES MELLITUS, TYPE II 08/01/2008  . GOUT 08/01/2008  . ERECTILE DYSFUNCTION 08/01/2008  . PERIPHERAL NEUROPATHY 08/01/2008  . CORONARY ARTERY DISEASE 08/01/2008    non obstructive per cath 1/10; Dr. Juanda Chance  . 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 Gastroparesis 08/01/2008  . End stage renal failure on dialysis 08/01/2008  . NEPHROLITHIASIS, HX OF 08/01/2008  . Chronic systolic heart failure 06/21/2010  . COPD 07/21/2010  . Diverticulosis of colon (without mention of hemorrhage) 10/28/2010  . Benign neoplasm of colon 10/28/2010  . Hemorrhoids, internal 10/28/2010  . Renal cyst     bilateral  . Second degree Mobitz I AV block     asymptomatic  . Cyst     infected cyst on back   . Muscle spasm of back     back muscle spasms and pt is unable to lay flat   .  Abscess of back     lower middle portion of back  . Headache(784.0)     HX; of migraines 25-30 years ago  . Cancer     bBasal cell on nose  . Peripheral vascular disease     poor circulation   Past Surgical History  Procedure Laterality Date  . Atrial ablation surgery  07-2008, 07/08/09    Dr Johney Frame  . Right foot  09-2009    infection  . Colonoscopy  38yrs ago    Dr.Orr  . Av fistula placement      left upper arm  . S/p right arm surgery after fracture    . Pilonidal cyst excision      s/p  . Cataract extraction      Hx: right eye  . Tonsillectomy    . I&d extremity Left 12/29/2012    Procedure: IRRIGATION AND DEBRIDEMENT EXTREMITY;  Surgeon: Nadara Mustard, MD;  Location: [facility] OR;  Service: Orthopedics;  Laterality: Left;  Irrigation and Debridement Left Leg, VAC, [place]  . Toe amputation Right 01/18/2013    Dr [name]  . I&d extremity Bilateral 01/18/2013    Procedure: IRRIGATION AND DEBRIDEMENT EXTREMITYLEFT LEG AND RIGHT FOOT;  Surgeon: Nadara Mustard, MD;  Location: [facility] OR;  Service: Orthopedics;  Laterality: Bilateral;  Left Leg Excisional Debridement  . Skin graft Left     left leg within the past month  . Amputation Left 02/02/2013  Procedure: AMPUTATION BELOW KNEE;  Surgeon: Nadara Mustard, MD;  Location: [facility] OR;  Service: Orthopedics;  Laterality: Left;  Left Below Knee Amputation  . Fracture surgery    . Vasectomy    . Amputation Left 03/23/2013    Procedure: REVISION OF BELOW THE KNEE  AMPUTATION ;  Surgeon: Nadara Mustard, MD;  Location: [facility];  Service: Orthopedics;  Laterality: Left;  Left Below Knee Amputation Revision  . I&d extremity Left 05/04/2013    Procedure: Irrigation and Debridement Left Below Knee Ampuation;  Surgeon: Nadara Mustard, MD;  Location: [facility];  Service: Orthopedics;  Laterality: Left;  Irrigation and Debridement Left Below Knee Ampuation  . Av fistula placement Left   . Below knee leg amputation Left 05/30/2013    DR DUDA  . Amputation Left  05/30/2013    Procedure: REVISION AMPUTATION BELOW KNEE;  Surgeon: Nadara Mustard, MD;  Location: [facility];  Service: Orthopedics;  Laterality: Left;  Revision Left Below Knee Amputation, Possible Above Knee Amputation   Family History  Problem Relation Age of Onset  . Diabetes Mother   . Diabetes Maternal Grandmother   . Diabetes Paternal Grandmother   . Cancer Neg Hx     no FH of Colon Cancer   History  Substance Use Topics  . Smoking status: Current Every Day Smoker -- 0.25 packs/day for 40 years    Types: Cigarettes  . Smokeless tobacco: Never Used  . Alcohol Use: No    Review of Systems  Respiratory: Positive for cough and shortness of breath.   Musculoskeletal:       Healing BKA stump left side.  All other systems reviewed and are negative.    Allergies  Review of patient's allergies indicates no known allergies.  Home Medications   Current Outpatient Rx  Name  Route  Sig  Dispense  Refill  . allopurinol (ZYLOPRIM) 100 MG tablet   Oral   Take 100 mg by mouth daily.          . B Complex-C-Folic Acid (DIALYVITE PO)   Oral   Take 100 mg by mouth daily.           . calcium acetate (PHOSLO) 667 MG capsule   Oral   Take 667 mg by mouth 3 (three) times daily with meals.         Marland Kitchen doxycycline (VIBRA-TABS) 100 MG tablet   Oral   Take 100 mg by mouth daily. For 1 month. Started 03/07/13         . ethyl chloride spray   Topical   Apply 1 application topically daily as needed. For pain         . FOSRENOL 1000 MG chewable tablet   Oral   Chew 2 tablets by mouth 3 (three) times daily with meals.          . gabapentin (NEURONTIN) 300 MG capsule   Oral   Take 300 mg by mouth 3 (three) times daily.         Marland Kitchen HYDROcodone-acetaminophen (NORCO/VICODIN) 5-325 MG per tablet   Oral   Take 1 tablet by mouth every 4 (four) hours as needed for pain.         Marland Kitchen insulin aspart (NOVOLOG) 100 UNIT/ML injection   Subcutaneous   Inject 20 Units into the skin 3  (three) times daily before meals.          . insulin glargine (LANTUS) 100 UNIT/ML injection   Subcutaneous  Inject 25 Units into the skin at bedtime.          Marland Kitchen loperamide (IMODIUM) 2 MG capsule   Oral   Take 2 mg by mouth 4 (four) times daily as needed for diarrhea or loose stools.         Marland Kitchen omeprazole (PRILOSEC) 20 MG capsule   Oral   Take 1 capsule (20 mg total) by mouth daily.   90 capsule   1   . oxyCODONE-acetaminophen (ROXICET) 5-325 MG per tablet   Oral   Take 1 tablet by mouth every 4 (four) hours as needed for severe pain.   60 tablet   0   . oxyCODONE-acetaminophen (ROXICET) 5-325 MG per tablet   Oral   Take 1 tablet by mouth every 4 (four) hours as needed for severe pain.   60 tablet   0   . temazepam (RESTORIL) 30 MG capsule   Oral   Take 30 mg by mouth at bedtime.         Marland Kitchen warfarin (COUMADIN) 5 MG tablet   Oral   Take 2.5-5 mg by mouth daily. Take 5mg  daily except 2.5mg  on Tuesdays and Thursdays          BP 145/72  Temp(Src) 98 F (36.7 C) (Oral)  Resp 20  SpO2 95% Physical Exam  Nursing note and vitals reviewed. Constitutional: He appears well-developed and well-nourished. No distress.  HENT:  Head: Normocephalic and atraumatic.  Eyes: Conjunctivae are normal. Pupils are equal, round, and reactive to light.  Neck: Neck supple. JVD present. No tracheal deviation present. No thyromegaly present.  Cardiovascular: Normal rate and regular rhythm.   No murmur heard. Pulmonary/Chest: Effort normal and breath sounds normal.  No respiratory distress. Scant diffuse crackles diffusely.  Abdominal: Soft. Bowel sounds are normal. He exhibits no distension. There is no tenderness.  Musculoskeletal: Normal range of motion. He exhibits no edema and no tenderness.  Left BKA   Neurological: He is alert. Coordination normal.  Skin: Skin is warm and dry. No rash noted.  Psychiatric: He has a normal mood and affect.    ED Course  Procedures  (including critical care time) Labs Review Labs Reviewed - No data to display Imaging Review No results found.  EKG Interpretation   None      Results for orders placed during the hospital encounter of 06/13/13  CBC WITH DIFFERENTIAL      Result Value Range   WBC 9.1  4.0 - 10.5 K/uL   RBC 3.99 (*) 4.22 - 5.81 MIL/uL   Hemoglobin 10.2 (*) 13.0 - 17.0 g/dL   HCT 16.1 (*) 09.6 - 04.5 %   MCV 87.7  78.0 - 100.0 fL   MCH 25.6 (*) 26.0 - 34.0 pg   MCHC 29.1 (*) 30.0 - 36.0 g/dL   RDW 40.9 (*) 81.1 - 91.4 %   Platelets 384  150 - 400 K/uL   Neutrophils Relative % 72  43 - 77 %   Neutro Abs 6.6  1.7 - 7.7 K/uL   Lymphocytes Relative 21  12 - 46 %   Lymphs Abs 1.9  0.7 - 4.0 K/uL   Monocytes Relative 6  3 - 12 %   Monocytes Absolute 0.5  0.1 - 1.0 K/uL   Eosinophils Relative 0  0 - 5 %   Eosinophils Absolute 0.0  0.0 - 0.7 K/uL   Basophils Relative 1  0 - 1 %   Basophils Absolute 0.1  0.0 -  0.1 K/uL  POCT I-STAT, CHEM 8      Result Value Range   Sodium 138  137 - 147 mEq/L   Potassium 4.0  3.7 - 5.3 mEq/L   Chloride 99  96 - 112 mEq/L   BUN 43 (*) 6 - 23 mg/dL   Creatinine, Ser 4.54 (*) 0.50 - 1.35 mg/dL   Glucose, Bld 098 (*) 70 - 99 mg/dL   Calcium, Ion 1.19 (*) 1.13 - 1.30 mmol/L   TCO2 25  0 - 100 mmol/L   Hemoglobin 12.9 (*) 13.0 - 17.0 g/dL   HCT 14.7 (*) 82.9 - 56.2 %   Dg Chest 2 View  06/13/2013   CLINICAL DATA:  Cough, shortness of Breath  EXAM: CHEST  2 VIEW  COMPARISON:  12/29/2012  FINDINGS: Cardiomediastinal silhouette is stable. Central mild bronchitic changes with worsening from prior exam. No pulmonary edema. Mild increased interstitial markings in right upper lobe. Atypical pneumonitis cannot be excluded.  IMPRESSION: Central mild bronchitic changes with worsening from prior exam. No pulmonary edema. Mild increased interstitial markings in right upper lobe. Atypical pneumonitis cannot be excluded.   Electronically Signed   By: Natasha Mead M.D.   On: 06/13/2013  13:03    Patient feels significantly improved after treatment with 2 nebulized treatments and Solu-Medrol administered by EMS. Chest x-ray viewed by me  MDM  No diagnosis found. In light of productive cough and chest x-ray findings we'll treat for healthcare associated pneumonia. Treat empirically for flu pending PCR result I don't feel that patient needs emergent hemodialysis. He is able and flat. Normal potassium. Spoke with DrChiu plan admit medical surgical floor. Intravenous antibiotics Diagnosis healthcare associated pneumonia    Doug Sou, MD 06/13/13 1444

## 2013-06-13 NOTE — Consult Note (Signed)
[facility] Renal Consultation Note  Indication for Consultation:  Management of ESRD/hemodialysis; anemia, hypertension/volume and secondary hyperparathyroidism  HPI: [name] is a 62 y.o. male presents to the ED with 2-3 days hx of worsening cough and SOB.   Admitted with RUL pneumonia.Pt reports he continues to smoke cigarettes and  had improvement of  symptoms with neb tx and steroids en route to the ED. He had his complete Hemodialysis tx Monday On holiday schedule Mon/wed/Sat for tts schedule at [facility].  Recently admitted 05/30/13 after fall and wound dehiscence  of L bka  With Dr. [name] repair. In room now without sob/ "feels like I have a touch of the FLU"'.   Past Medical History  Diagnosis Date  . Chronic kidney disease     dialysis t,th,sat  . GERD (gastroesophageal reflux disease)   . Hypertension   . Neuromuscular disorder     neurophathy  . Atrial fibrillation     chronically anticoagulated with coumadin  . Hyperlipidemia   . Atrial flutter 07/07/2009    s/p ablation  . DIABETES MELLITUS, TYPE II 08/01/2008  . GOUT 08/01/2008  . ERECTILE DYSFUNCTION 08/01/2008  . PERIPHERAL NEUROPATHY 08/01/2008  . CORONARY ARTERY DISEASE 08/01/2008    non obstructive per cath 1/10; Dr. Juanda Chance  . 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 Gastroparesis 08/01/2008  . End stage renal failure on dialysis 08/01/2008  . NEPHROLITHIASIS, HX OF 08/01/2008  . Chronic systolic heart failure 06/21/2010  . COPD 07/21/2010  . Diverticulosis of colon (without mention of hemorrhage) 10/28/2010  . Benign neoplasm of colon 10/28/2010  . Hemorrhoids, internal 10/28/2010  . Renal cyst     bilateral  . Second degree Mobitz I AV block     asymptomatic  . Cyst     infected cyst on back   . Muscle spasm of back     back muscle spasms and pt is unable to lay flat   . Abscess of back     lower middle portion of back  . Headache(784.0)     HX; of migraines  25-30 years ago  . Cancer     bBasal cell on nose  . Peripheral vascular disease     poor circulation    Past Surgical History  Procedure Laterality Date  . Atrial ablation surgery  07-2008, 07/08/09    Dr Johney Frame  . Right foot  09-2009    infection  . Colonoscopy  33yrs ago    Dr.Orr  . Av fistula placement      left upper arm  . S/p right arm surgery after fracture    . Pilonidal cyst excision      s/p  . Cataract extraction      Hx: right eye  . Tonsillectomy    . I&d extremity Left 12/29/2012    Procedure: IRRIGATION AND DEBRIDEMENT EXTREMITY;  Surgeon: Nadara Mustard, MD;  Location: [facility] OR;  Service: Orthopedics;  Laterality: Left;  Irrigation and Debridement Left Leg, VAC, [place]  . Toe amputation Right 01/18/2013    Dr [name]  . I&d extremity Bilateral 01/18/2013    Procedure: IRRIGATION AND DEBRIDEMENT EXTREMITYLEFT LEG AND RIGHT FOOT;  Surgeon: Nadara Mustard, MD;  Location: [facility] OR;  Service: Orthopedics;  Laterality: Bilateral;  Left Leg Excisional Debridement  . Skin graft Left     left leg within the past month  . Amputation Left 02/02/2013    Procedure:  AMPUTATION BELOW KNEE;  Surgeon: Nadara Mustard, MD;  Location: [facility] OR;  Service: Orthopedics;  Laterality: Left;  Left Below Knee Amputation  . Fracture surgery    . Vasectomy    . Amputation Left 03/23/2013    Procedure: REVISION OF BELOW THE KNEE  AMPUTATION ;  Surgeon: Nadara Mustard, MD;  Location: [facility] OR;  Service: Orthopedics;  Laterality: Left;  Left Below Knee Amputation Revision  . I&d extremity Left 05/04/2013    Procedure: Irrigation and Debridement Left Below Knee Ampuation;  Surgeon: Nadara Mustard, MD;  Location: [facility] OR;  Service: Orthopedics;  Laterality: Left;  Irrigation and Debridement Left Below Knee Ampuation  . Av fistula placement Left   . Below knee leg amputation Left 05/30/2013    DR DUDA  . Amputation Left 05/30/2013    Procedure: REVISION AMPUTATION BELOW KNEE;  Surgeon: Nadara Mustard, MD;  Location:  [facility] OR;  Service: Orthopedics;  Laterality: Left;  Revision Left Below Knee Amputation, Possible Above Knee Amputation      Family History  Problem Relation Age of Onset  . Diabetes Mother   . Diabetes Maternal Grandmother   . Diabetes Paternal Grandmother   . Cancer Neg Hx     no FH of Colon Cancer      reports that he has been smoking Cigarettes.  He has a 10 pack-year smoking history. He has never used smokeless tobacco. He reports that he does not drink alcohol or use illicit drugs.  No Known Allergies  Prior to Admission medications   Medication Sig Start Date End Date Taking? Authorizing Provider  allopurinol (ZYLOPRIM) 100 MG tablet Take 100 mg by mouth at bedtime.  07/30/10  Yes Historical Provider, MD  B Complex-C-Folic Acid (DIALYVITE PO) Take 100 mg by mouth daily.     Yes Historical Provider, MD  calcium acetate (PHOSLO) 667 MG capsule Take 667 mg by mouth 3 (three) times daily with meals.   Yes Historical Provider, MD  doxycycline (VIBRA-TABS) 100 MG tablet Take 100 mg by mouth daily. For 1 month. Started 03/07/13   Yes Historical Provider, MD  ethyl chloride spray Apply 1 application topically daily as needed. For pain 11/01/11  Yes Historical Provider, MD  FOSRENOL 1000 MG chewable tablet Chew 2 tablets by mouth 3 (three) times daily with meals.  09/24/10  Yes Historical Provider, MD  gabapentin (NEURONTIN) 300 MG capsule Take 300 mg by mouth at bedtime.    Yes Historical Provider, MD  HYDROcodone-acetaminophen (NORCO/VICODIN) 5-325 MG per tablet Take 1 tablet by mouth every 4 (four) hours as needed for pain.   Yes Historical Provider, MD  insulin aspart (NOVOLOG) 100 UNIT/ML injection Inject 20 Units into the skin 3 (three) times daily with meals.  06/19/12 06/19/13 Yes Romero Belling, MD  insulin glargine (LANTUS) 100 UNIT/ML injection Inject 25 Units into the skin at bedtime.    Yes Historical Provider, MD  loperamide (IMODIUM) 2 MG capsule Take 2 mg by mouth daily as needed for  diarrhea or loose stools.    Yes Historical Provider, MD  omeprazole (PRILOSEC) 20 MG capsule Take 1 capsule (20 mg total) by mouth daily. 06/04/13  Yes Romero Belling, MD  oxyCODONE-acetaminophen (ROXICET) 5-325 MG per tablet Take 1 tablet by mouth every 4 (four) hours as needed for severe pain. 06/01/13  Yes Nadara Mustard, MD  temazepam (RESTORIL) 30 MG capsule Take 30 mg by mouth at bedtime.   Yes Historical Provider, MD  warfarin (COUMADIN) 5  MG tablet Take 5 mg by mouth at bedtime.    Yes Historical Provider, MD     Anti-infectives   Start     Dose/Rate Route Frequency Ordered Stop   06/15/13 1200  vancomycin (VANCOCIN) IVPB 1000 mg/200 mL premix     1,000 mg 200 mL/hr over 60 Minutes Intravenous Every M-W-F (Hemodialysis) 06/13/13 1459     06/15/13 1200  ceFEPIme (MAXIPIME) 2 g in dextrose 5 % 50 mL IVPB     2 g 100 mL/hr over 30 Minutes Intravenous Every M-W-F (Hemodialysis) 06/13/13 1459     06/13/13 1615  vancomycin (VANCOCIN) 2,000 mg in sodium chloride 0.9 % 500 mL IVPB     2,000 mg 250 mL/hr over 120 Minutes Intravenous  Once 06/13/13 1547     06/13/13 1600  vancomycin (VANCOCIN) 2,000 mg in sodium chloride 0.9 % 250 mL IVPB  Status:  Discontinued     2,000 mg 250 mL/hr over 60 Minutes Intravenous  Once 06/13/13 1538 06/13/13 1546   06/13/13 1530  ceFEPIme (MAXIPIME) 1 g in dextrose 5 % 50 mL IVPB  Status:  Discontinued     1 g 100 mL/hr over 30 Minutes Intravenous 3 times per day 06/13/13 1526 06/13/13 1530   06/13/13 1530  oseltamivir (TAMIFLU) capsule 30 mg     30 mg Oral  Once 06/13/13 1515 06/13/13 1525   06/13/13 1500  oseltamivir (TAMIFLU) capsule 75 mg  Status:  Discontinued     75 mg Oral  Once 06/13/13 1445 06/13/13 1515   06/13/13 1500  vancomycin (VANCOCIN) 2,300 mg in sodium chloride 0.9 % 250 mL IVPB  Status:  Discontinued     2,300 mg 250 mL/hr over 60 Minutes Intravenous  Once 06/13/13 1458 06/13/13 1538   06/13/13 1500  ceFEPIme (MAXIPIME) 2 g in dextrose 5 %  50 mL IVPB     2 g 100 mL/hr over 30 Minutes Intravenous  Once 06/13/13 1458     06/13/13 1430  ceFEPIme (MAXIPIME) 1 g in dextrose 5 % 50 mL IVPB  Status:  Discontinued     1 g 100 mL/hr over 30 Minutes Intravenous 3 times per day 06/13/13 1424 06/13/13 1450      Results for orders placed during the hospital encounter of 06/13/13 (from the past 48 hour(s))  CBC WITH DIFFERENTIAL     Status: Abnormal   Collection Time    06/13/13 12:19 PM      Result Value Range   WBC 9.1  4.0 - 10.5 K/uL   RBC 3.99 (*) 4.22 - 5.81 MIL/uL   Hemoglobin 10.2 (*) 13.0 - 17.0 g/dL   HCT 78.2 (*) 95.6 - 21.3 %   MCV 87.7  78.0 - 100.0 fL   MCH 25.6 (*) 26.0 - 34.0 pg   MCHC 29.1 (*) 30.0 - 36.0 g/dL   RDW 08.6 (*) 57.8 - 46.9 %   Platelets 384  150 - 400 K/uL   Neutrophils Relative % 72  43 - 77 %   Neutro Abs 6.6  1.7 - 7.7 K/uL   Lymphocytes Relative 21  12 - 46 %   Lymphs Abs 1.9  0.7 - 4.0 K/uL   Monocytes Relative 6  3 - 12 %   Monocytes Absolute 0.5  0.1 - 1.0 K/uL   Eosinophils Relative 0  0 - 5 %   Eosinophils Absolute 0.0  0.0 - 0.7 K/uL   Basophils Relative 1  0 - 1 %   Basophils  Absolute 0.1  0.0 - 0.1 K/uL  POCT I-STAT, CHEM 8     Status: Abnormal   Collection Time    06/13/13 12:43 PM      Result Value Range   Sodium 138  137 - 147 mEq/L   Potassium 4.0  3.7 - 5.3 mEq/L   Chloride 99  96 - 112 mEq/L   BUN 43 (*) 6 - 23 mg/dL   Creatinine, Ser 5.28 (*) 0.50 - 1.35 mg/dL   Glucose, Bld 413 (*) 70 - 99 mg/dL   Calcium, Ion 2.44 (*) 1.13 - 1.30 mmol/L   TCO2 25  0 - 100 mmol/L   Hemoglobin 12.9 (*) 13.0 - 17.0 g/dL   HCT 01.0 (*) 27.2 - 53.6 %    ROS: Recent Constipation with incr Oxycodene use for BKA procedure usually has Diarrhea with diabetic Gi system requiring paregoric. Normal bm now .  Physical Exam: Filed Vitals:   06/13/13 1600  BP: 140/73  Pulse: 111  Temp:   Resp: 23     General: Alert wm obese, NAD, talkative , appropriate  HEENT: [place] mmm Eyes: eomi Neck:  Supple no jvd Heart: RRR, no rub or mur Lungs: Crackle RLL, faint wheezing and decr BS throughout Abdomen:  Obese, bs pos. , soft , nontender Extremities: L BKA stump wrapped clean and dry/ trace r pedal edema/ R  5th toe amp Skin: No overt rash or ulcer/ dry scaly skin  Neuro: OX3,  Dialysis Access: pos. Bruit L UA AVF  Dialysis Orders: Center: [facility] on TTS [employer] schedule [facility]) . EDW 104kg leaving below  Recently 102.9, 103.8 HD Bath 2.0K, 1.25 Ca  Time 4 hrs Heparin 0. Access L U A AVF BFR 400 DFR 800    Hectorol 2 mcg IV/HD Epogen 20000   Units IV/HD  Venofer  50mg    Other op lab hgb 8.6, < 9.8 <8.4  tfs 12% venofer loaded and weekly 50mg  now wkly  Assessment/Plan 1. PNA- nebs, abx, O2 per primary 2. ESRD- HD today for holiday schedule 3. Hypertension/volume  - bp stable pre hd / leaving below edw past 2 tx , uf 2 l today/ lower edw at dc 4. Anemia  - HGB= 12.9  Fu hgb hold ESA for now /weekly venofer 5. Metabolic bone disease -  hectorol and binder with meal 6. HO A. Fib- NSR on my exam- on coumadin [facility]  7. HO PVD-sp bka revision sec to fall at home, stable at home 8. Copd/ tobacco abuse  Lenny Pastel, PA-C [facility] Beeper [phone] 06/13/2013, 4:20 PM   I have seen and examined patient, discussed with PA and agree with assessment and plan as outlined above. Vinson Moselle MD pager [phone]    cell [phone] 06/13/2013, 5:09 PM

## 2013-06-13 NOTE — Progress Notes (Addendum)
ANTIBIOTIC CONSULT NOTE - INITIAL  Pharmacy Consult for Vancomycin, Cefepime Indication: HCAP  No Known Allergies  Patient Measurements:   Adjusted Body Weight:    Vital Signs: Temp: 98.1 F (36.7 C) (12/31 1312) Temp src: Rectal (12/31 1312) BP: 132/76 mmHg (12/31 1230) Pulse Rate: 95 (12/31 1230) Intake/Output from previous day:   Intake/Output from this shift:    Labs:  Recent Labs  06/13/13 1219 06/13/13 1243  WBC 9.1  --   HGB 10.2* 12.9*  PLT 384  --   CREATININE  --  7.20*   The CrCl is unknown because both a height and weight (above a minimum accepted value) are required for this calculation. No results found for this basename: VANCOTROUGH, VANCOPEAK, VANCORANDOM, GENTTROUGH, GENTPEAK, GENTRANDOM, TOBRATROUGH, TOBRAPEAK, TOBRARND, AMIKACINPEAK, AMIKACINTROU, AMIKACIN,  in the last 72 hours   Microbiology: No results found for this or any previous visit (from the past 720 hour(s)).  Medical History: Past Medical History  Diagnosis Date  . Chronic kidney disease     dialysis t,th,sat  . GERD (gastroesophageal reflux disease)   . Hypertension   . Neuromuscular disorder     neurophathy  . Atrial fibrillation     chronically anticoagulated with coumadin  . Hyperlipidemia   . Atrial flutter 07/07/2009    s/p ablation  . DIABETES MELLITUS, TYPE II 08/01/2008  . GOUT 08/01/2008  . ERECTILE DYSFUNCTION 08/01/2008  . PERIPHERAL NEUROPATHY 08/01/2008  . CORONARY ARTERY DISEASE 08/01/2008    non obstructive per cath 1/10; Dr. Juanda Chance  . 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 Gastroparesis 08/01/2008  . End stage renal failure on dialysis 08/01/2008  . NEPHROLITHIASIS, HX OF 08/01/2008  . Chronic systolic heart failure 06/21/2010  . COPD 07/21/2010  . Diverticulosis of colon (without mention of hemorrhage) 10/28/2010  . Benign neoplasm of colon 10/28/2010  . Hemorrhoids, internal 10/28/2010  . Renal cyst     bilateral  . Second degree Mobitz I AV  block     asymptomatic  . Cyst     infected cyst on back   . Muscle spasm of back     back muscle spasms and pt is unable to lay flat   . Abscess of back     lower middle portion of back  . Headache(784.0)     HX; of migraines 25-30 years ago  . Cancer     bBasal cell on nose  . Peripheral vascular disease     poor circulation    Medications:  See med rec  Assessment: SOB, cough 62 y/o M presents with SOB and cough x 2d. Patient is ESRD with last HD 2 days ago. Symptoms improved with albuterol and SoluMedrol.  Goal of Therapy:  Vanco level <20 prior to re-dosing post-HD.  Plan:  Vancomycin 2300mg  IV x 1 load, then 1000g post-HD Cefepime 2g IV qHD  Crystal S. Merilynn Finland, PharmD, BCPS Clinical Staff Pharmacist Pager [phone]  Misty Stanley Stillinger 06/13/2013,2:43 PM   Adden @1800 : Patient is  On coumadin PTA for Afib.  Home dose is 5mg  daily with last dose taken on 12/30 (verified with patient).  Today's INR is subtherapeutic at 1.69.  Will give coumadin 7.5mg  PO x1 today (1.5x home dose) Dorna Leitz, PharmD, BCPS

## 2013-06-14 DIAGNOSIS — Z7901 Long term (current) use of anticoagulants: Secondary | ICD-10-CM

## 2013-06-14 DIAGNOSIS — N186 End stage renal disease: Secondary | ICD-10-CM

## 2013-06-14 DIAGNOSIS — M109 Gout, unspecified: Secondary | ICD-10-CM

## 2013-06-14 DIAGNOSIS — I10 Essential (primary) hypertension: Secondary | ICD-10-CM

## 2013-06-14 DIAGNOSIS — E785 Hyperlipidemia, unspecified: Secondary | ICD-10-CM

## 2013-06-14 LAB — COMPREHENSIVE METABOLIC PANEL
ALT: 9 U/L (ref 0–53)
AST: 16 U/L (ref 0–37)
Albumin: 3.3 g/dL — ABNORMAL LOW (ref 3.5–5.2)
Alkaline Phosphatase: 76 U/L (ref 39–117)
BUN: 32 mg/dL — ABNORMAL HIGH (ref 6–23)
CO2: 26 mEq/L (ref 19–32)
Calcium: 9 mg/dL (ref 8.4–10.5)
Chloride: 95 mEq/L — ABNORMAL LOW (ref 96–112)
Creatinine, Ser: 5.24 mg/dL — ABNORMAL HIGH (ref 0.50–1.35)
GFR calc Af Amer: 12 mL/min — ABNORMAL LOW (ref >90)
GFR calc non Af Amer: 11 mL/min — ABNORMAL LOW (ref >90)
Glucose, Bld: 399 mg/dL — ABNORMAL HIGH (ref 70–99)
Potassium: 4.5 mEq/L (ref 3.7–5.3)
Sodium: 139 mEq/L (ref 137–147)
Total Bilirubin: 0.4 mg/dL (ref 0.3–1.2)
Total Protein: 6.9 g/dL (ref 6.0–8.3)

## 2013-06-14 LAB — GLUCOSE, CAPILLARY
GLUCOSE-CAPILLARY: 350 mg/dL — AB (ref 70–99)
GLUCOSE-CAPILLARY: 181 mg/dL — AB (ref 70–99)
GLUCOSE-CAPILLARY: 41 mg/dL — AB (ref 70–99)
Glucose-Capillary: 342 mg/dL — ABNORMAL HIGH (ref 70–99)
Glucose-Capillary: 351 mg/dL — ABNORMAL HIGH (ref 70–99)
Glucose-Capillary: 96 mg/dL (ref 70–99)
Glucose-Capillary: 230 mg/dL — ABNORMAL HIGH (ref 70–99)

## 2013-06-14 LAB — CBC
HCT: 31.8 % — ABNORMAL LOW (ref 39.0–52.0)
Hemoglobin: 9.2 g/dL — ABNORMAL LOW (ref 13.0–17.0)
MCH: 25 pg — ABNORMAL LOW (ref 26.0–34.0)
MCHC: 28.9 g/dL — ABNORMAL LOW (ref 30.0–36.0)
MCV: 86.4 fL (ref 78.0–100.0)
Platelets: 356 10*3/uL (ref 150–400)
RBC: 3.68 MIL/uL — ABNORMAL LOW (ref 4.22–5.81)
RDW: 18.5 % — ABNORMAL HIGH (ref 11.5–15.5)
WBC: 5.5 10*3/uL (ref 4.0–10.5)

## 2013-06-14 LAB — PROTIME-INR
INR: 2.06 — ABNORMAL HIGH (ref 0.00–1.49)
Prothrombin Time: 22.6 seconds — ABNORMAL HIGH (ref 11.6–15.2)

## 2013-06-14 LAB — STREP PNEUMONIAE URINARY ANTIGEN: Strep Pneumo Urinary Antigen: NEGATIVE

## 2013-06-14 MED ORDER — IPRATROPIUM-ALBUTEROL 0.5-2.5 (3) MG/3ML IN SOLN
3.0000 mL | Freq: Four times a day (QID) | RESPIRATORY_TRACT | Status: DC
  Administered 2013-06-14 – 2013-06-17 (×13): 3 mL via RESPIRATORY_TRACT
  Filled 2013-06-14 (×12): qty 3

## 2013-06-14 MED ORDER — WARFARIN SODIUM 5 MG PO TABS
5.0000 mg | ORAL_TABLET | Freq: Once | ORAL | Status: AC
  Administered 2013-06-14: 5 mg via ORAL
  Filled 2013-06-14: qty 1

## 2013-06-14 MED ORDER — IPRATROPIUM-ALBUTEROL 0.5-2.5 (3) MG/3ML IN SOLN
3.0000 mL | RESPIRATORY_TRACT | Status: DC | PRN
  Filled 2013-06-14: qty 3

## 2013-06-14 NOTE — Progress Notes (Signed)
[facility]                                                                              Patient Demographics  [name], is a 63 y.o. male, DOB - November 14, 1950, EZM:[id number]  Admit date - 06/13/2013   Admitting Physician Donne Hazel, MD  Outpatient Primary MD for the patient is Renato Shin, MD  LOS - 1   Chief Complaint  Patient presents with  . Shortness of Breath        Assessment & Plan    Principal Problem:   COPD Active Problems:   DIABETES MELLITUS, TYPE II   Atrial fibrillation   Chronic systolic heart failure   ESRD on dialysis   Healthcare-associated pneumonia   HCAP (healthcare-associated pneumonia)  COPD exacerbation secondary to healthcare acquired pneumonia -Patient recently hospitalized for left transtibial amputation and dehiscence -Will continue vancomycin and cefepime -Patient currently afebrile no leukocytosis -Continue DuoNeb treatments  End-stage disease requiring hemodialysis -Nephrology consulted and following -Patient normally dialyzes on Tuesday Thursday Saturday -Continue renavit, Hectorol, phoslo  Atrial fibrillation -Currently rate controlled -Continue Coumadin with pharmacy to follow and dose -INR is 2.06  Anemia likely secondary to chronic disease -Hemoglobin 9.2, currently stable -Will continue to monitor CBC -Continue ferrous gluconate  Tobacco abuse -Smoking cessation counseling ordered  Diabetes mellitus with neuropathy -Continue Lantus and insulin sliding scale with CBG monitoring -Continue gabapentin for neuropathy  Gout -Continue allopurinol  GERD -Continue Protonix  Code Status: Full  Family Communication: None at bedside  Disposition Plan: Admitted   Procedures  None  Consults   Nephrology  DVT Prophylaxis Warfarin  Lab Results  Component Value Date   PLT 356 06/14/2013    Medications  Scheduled Meds: . allopurinol  100 mg Oral QHS  . calcium acetate  667 mg Oral TID WC  .  [START ON 06/15/2013] ceFEPime (MAXIPIME) IV  2 g Intravenous Q M,W,F-HD  . doxercalciferol  2 mcg Intravenous Q T,Th,Sa-HD  . ferric gluconate (FERRLECIT/NULECIT) IV  62.5 mg Intravenous Q T,Th,Sa-HD  . gabapentin  300 mg Oral QHS  . insulin aspart  0-15 Units Subcutaneous TID WC  . insulin aspart  0-5 Units Subcutaneous QHS  . insulin aspart  20 Units Subcutaneous TID WC  . insulin glargine  25 Units Subcutaneous QHS  . ipratropium-albuterol  3 mL Nebulization QID  . lanthanum  2,000 mg Oral TID WC  . multivitamin  1 tablet Oral QHS  . pantoprazole  40 mg Oral Daily  . temazepam  30 mg Oral QHS  . vancomycin  2,000 mg Intravenous Once  . [START ON 06/15/2013] vancomycin  1,000 mg Intravenous Q M,W,F-HD  . warfarin  5 mg Oral ONCE-1800  . Warfarin - Pharmacist Dosing Inpatient   Does not apply q1800   Continuous Infusions:  PRN Meds:.acetaminophen, acetaminophen, HYDROcodone-acetaminophen, ipratropium-albuterol, loperamide, morphine injection, ondansetron (ZOFRAN) IV, ondansetron  Antibiotics    Anti-infectives   Start     Dose/Rate Route Frequency Ordered Stop   06/15/13 1200  vancomycin (VANCOCIN) IVPB 1000 mg/200 mL premix     1,000 mg 200 mL/hr over 60 Minutes Intravenous Every M-W-F (Hemodialysis) 06/13/13 1459  06/15/13 1200  ceFEPIme (MAXIPIME) 2 g in dextrose 5 % 50 mL IVPB     2 g 100 mL/hr over 30 Minutes Intravenous Every M-W-F (Hemodialysis) 06/13/13 1459     06/13/13 1615  vancomycin (VANCOCIN) 2,000 mg in sodium chloride 0.9 % 500 mL IVPB     2,000 mg 250 mL/hr over 120 Minutes Intravenous  Once 06/13/13 1547     06/13/13 1600  vancomycin (VANCOCIN) 2,000 mg in sodium chloride 0.9 % 250 mL IVPB  Status:  Discontinued     2,000 mg 250 mL/hr over 60 Minutes Intravenous  Once 06/13/13 1538 06/13/13 1546   06/13/13 1530  ceFEPIme (MAXIPIME) 1 g in dextrose 5 % 50 mL IVPB  Status:  Discontinued     1 g 100 mL/hr over 30 Minutes Intravenous 3 times per day 06/13/13 1526  06/13/13 1530   06/13/13 1530  oseltamivir (TAMIFLU) capsule 30 mg     30 mg Oral  Once 06/13/13 1515 06/13/13 1525   06/13/13 1500  oseltamivir (TAMIFLU) capsule 75 mg  Status:  Discontinued     75 mg Oral  Once 06/13/13 1445 06/13/13 1515   06/13/13 1500  vancomycin (VANCOCIN) 2,300 mg in sodium chloride 0.9 % 250 mL IVPB  Status:  Discontinued     2,300 mg 250 mL/hr over 60 Minutes Intravenous  Once 06/13/13 1458 06/13/13 1538   06/13/13 1500  ceFEPIme (MAXIPIME) 2 g in dextrose 5 % 50 mL IVPB     2 g 100 mL/hr over 30 Minutes Intravenous  Once 06/13/13 1458 06/14/13 0008   06/13/13 1430  ceFEPIme (MAXIPIME) 1 g in dextrose 5 % 50 mL IVPB  Status:  Discontinued     1 g 100 mL/hr over 30 Minutes Intravenous 3 times per day 06/13/13 1424 06/13/13 1450       Time Spent in minutes   30 minutes   Jamillia Closson D.O. on 06/14/2013 at 1:03 PM  Between 7am to 7pm - Pager - [phone]  After 7pm go to [email or web address] - password [email or web address]  And look for the night coverage person covering for me after hours  [facility] Office  [phone]    Subjective:   [name] seen and examined today.  Patient states her shortness of breath has improved since being admitted. Patient continues to complain of cough.  Objective:   Filed Vitals:   06/14/13 0009 06/14/13 0425 06/14/13 0809 06/14/13 1214  BP:  121/74 154/76   Pulse:  86 89   Temp:  97.6 F (36.4 C) 97.3 F (36.3 C)   TempSrc:  Oral    Resp:  18 18   Height:      Weight:      SpO2: 96% 95% 97% 97%    Wt Readings from Last 3 Encounters:  06/13/13 98.1 kg (216 lb 4.3 oz)  05/31/13 103 kg (227 lb 1.2 oz)  05/31/13 103 kg (227 lb 1.2 oz)     Intake/Output Summary (Last 24 hours) at 06/14/13 1303 Last data filed at 06/14/13 1001  Gross per 24 hour  Intake    700 ml  Output    300 ml  Net    400 ml    Exam  General: Well developed, well nourished, NAD, appears stated age  HEENT: NCAT, PERRLA, EOMI,  Anicteic Sclera, mucous membranes moist.   Neck: Supple, no JVD, no masses  Cardiovascular: S1 S2 auscultated, no rubs, murmurs or gallops. Regular rate and rhythm.  Respiratory: Crackle  noted in RLL.  Decreased breath sounds throughout  Abdomen: Soft, obese, nontender, nondistended, + bowel sounds  Extremities: RLE: warm dry without cyanosis clubbing, trace edema, 5th toe amputation.  L BKA, currently wrapped.  Left upper extremity AV fistula with good bruit and thrill  Neuro: AAOx3, cranial nerves grossly intact.   Skin: Without rashes exudates or nodules  Psych: Normal affect and demeanor with intact judgement and insight  Data Review   Micro Results No results found for this or any previous visit (from the past 240 hour(s)).  Radiology Reports Dg Chest 2 View  06/13/2013   CLINICAL DATA:  Cough, shortness of Breath  EXAM: CHEST  2 VIEW  COMPARISON:  12/29/2012  FINDINGS: Cardiomediastinal silhouette is stable. Central mild bronchitic changes with worsening from prior exam. No pulmonary edema. Mild increased interstitial markings in right upper lobe. Atypical pneumonitis cannot be excluded.  IMPRESSION: Central mild bronchitic changes with worsening from prior exam. No pulmonary edema. Mild increased interstitial markings in right upper lobe. Atypical pneumonitis cannot be excluded.   Electronically Signed   By: Lahoma Crocker M.D.   On: 06/13/2013 13:03    CBC  Recent Labs Lab 06/13/13 1219 06/13/13 1243 06/14/13 0516  WBC 9.1  --  5.5  HGB 10.2* 12.9* 9.2*  HCT 35.0* 38.0* 31.8*  PLT 384  --  356  MCV 87.7  --  86.4  MCH 25.6*  --  25.0*  MCHC 29.1*  --  28.9*  RDW 18.7*  --  18.5*  LYMPHSABS 1.9  --   --   MONOABS 0.5  --   --   EOSABS 0.0  --   --   BASOSABS 0.1  --   --     Chemistries   Recent Labs Lab 06/13/13 1243 06/13/13 1525 06/14/13 0516  NA 138  --  139  K 4.0  --  4.5  CL 99  --  95*  CO2  --   --  26  GLUCOSE 210*  --  399*  BUN 43*  --  32*   CREATININE 7.20* 7.84* 5.24*  CALCIUM  --   --  9.0  AST  --   --  16  ALT  --   --  9  ALKPHOS  --   --  76  BILITOT  --   --  0.4   ------------------------------------------------------------------------------------------------------------------ estimated creatinine clearance is 18 ml/min (by C-G formula based on Cr of 5.24). ------------------------------------------------------------------------------------------------------------------ No results found for this basename: HGBA1C,  in the last 72 hours ------------------------------------------------------------------------------------------------------------------ No results found for this basename: CHOL, HDL, LDLCALC, TRIG, CHOLHDL, LDLDIRECT,  in the last 72 hours ------------------------------------------------------------------------------------------------------------------ No results found for this basename: TSH, T4TOTAL, FREET3, T3FREE, THYROIDAB,  in the last 72 hours ------------------------------------------------------------------------------------------------------------------ No results found for this basename: VITAMINB12, FOLATE, FERRITIN, TIBC, IRON, RETICCTPCT,  in the last 72 hours  Coagulation profile  Recent Labs Lab 06/13/13 1720 06/14/13 0516  INR 1.69* 2.06*    No results found for this basename: DDIMER,  in the last 72 hours  Cardiac Enzymes No results found for this basename: CK, CKMB, TROPONINI, MYOGLOBIN,  in the last 168 hours ------------------------------------------------------------------------------------------------------------------ No components found with this basename: POCBNP,

## 2013-06-14 NOTE — Progress Notes (Signed)
[facility] Progress Note    Subjective: no complaints   Exam  Blood pressure 153/77, pulse 70, temperature 97.3 F (36.3 C), temperature source Oral, resp. rate 17, height 6\' 1"  (1.854 m), weight 98.1 kg (216 lb 4.3 oz), SpO2 100.00%. Alert wm obese, NAD, talkative , appropriate  Neck: Supple no jvd  Heart: RRR, no rub or mur  Lungs: Crackle RLL, faint wheezing and decr BS throughout  Abdomen: Obese, bs pos. , soft , nontender  Extremities: L BKA stump wrapped clean and dry/ trace r pedal edema/ R 5th toe amp  Skin: No overt rash or ulcer/ dry scaly skin  Neuro: OX3, nonfocal Access: pos. Bruit L UA AVF   Dialysis Orders: Center: [facility] on TTS Radiographer, therapeutic schedule [facility]) .  EDW 104kg leaving below Recently 102.9, 103.8 HD Bath 2.0K, 1.25 Ca Time 4 hrs 56min Heparin 0. Access L U A AVF BFR 400 DFR 800  Hectorol 2 mcg IV/HD Epogen 20000 Units IV/HD Venofer 50mg   Other op lab hgb 8.6, < 9.8 <8.4 tfs 12% venofer loaded and weekly 50mg  now wkly   Assessment/Plan  1. PNA- nebs, abx, O2 per primary 2. ESRD- next HD Sat 3. Hypertension/volume - BP normal to high, 6kg below prior dry wt, cont to challenge as BP toleraes 4. Anemia - HGB= 12.9 Fu hgb hold ESA for now /weekly venofer 5. Metabolic bone disease - hectorol and binder with meal 6. HO A. Fib- NSR on my exam- on coumadin [facility] pharmacy  7. HO PVD-sp bka revision 05/30/13 sec to fall at home 8. Copd/ tobacco abuse    Kelly Splinter MD  pager [phone]    cell [phone]  06/14/2013, 4:36 PM   Recent Labs Lab 06/13/13 1243 06/13/13 1525 06/14/13 0516  NA 138  --  139  K 4.0  --  4.5  CL 99  --  95*  CO2  --   --  26  GLUCOSE 210*  --  399*  BUN 43*  --  32*  CREATININE 7.20* 7.84* 5.24*  CALCIUM  --   --  9.0    Recent Labs Lab 06/14/13 0516  AST 16  ALT 9  ALKPHOS 76  BILITOT 0.4  PROT 6.9  ALBUMIN 3.3*    Recent Labs Lab 06/13/13 1219 06/13/13 1243 06/14/13 0516  WBC 9.1  --  5.5   NEUTROABS 6.6  --   --   HGB 10.2* 12.9* 9.2*  HCT 35.0* 38.0* 31.8*  MCV 87.7  --  86.4  PLT 384  --  356   . allopurinol  100 mg Oral QHS  . calcium acetate  667 mg Oral TID WC  . [START ON 06/15/2013] ceFEPime (MAXIPIME) IV  2 g Intravenous Q M,W,F-HD  . doxercalciferol  2 mcg Intravenous Q T,Th,Sa-HD  . ferric gluconate (FERRLECIT/NULECIT) IV  62.5 mg Intravenous Q T,Th,Sa-HD  . gabapentin  300 mg Oral QHS  . insulin aspart  0-15 Units Subcutaneous TID WC  . insulin aspart  0-5 Units Subcutaneous QHS  . insulin aspart  20 Units Subcutaneous TID WC  . insulin glargine  25 Units Subcutaneous QHS  . ipratropium-albuterol  3 mL Nebulization QID  . lanthanum  2,000 mg Oral TID WC  . multivitamin  1 tablet Oral QHS  . pantoprazole  40 mg Oral Daily  . temazepam  30 mg Oral QHS  . vancomycin  2,000 mg Intravenous Once  . [START ON 06/15/2013] vancomycin  1,000 mg  Intravenous Q M,W,F-HD  . warfarin  5 mg Oral ONCE-1800  . Warfarin - Pharmacist Dosing Inpatient   Does not apply q1800     acetaminophen, acetaminophen, HYDROcodone-acetaminophen, ipratropium-albuterol, loperamide, morphine injection, ondansetron (ZOFRAN) IV, ondansetron

## 2013-06-14 NOTE — Progress Notes (Signed)
Patient CBG 41, asymptomatic.  Patient given 15g carbs (64mL cranberry juice).  CBG resolved to 96.  Sliding scale insulin and meal coverage insulin held.  Will continue to monitor.

## 2013-06-14 NOTE — Progress Notes (Signed)
ANTICOAGULATION CONSULT NOTE - Follow Up Consult  Pharmacy Consult for Coumadin Indication: atrial fibrillation  No Known Allergies  Patient Measurements: Height: 6\' 1"  (185.4 cm) Weight: 216 lb 4.3 oz (98.1 kg) IBW/kg (Calculated) : 79.9  Vital Signs: Temp: 97.3 F (36.3 C) (01/01 0809) Temp src: Oral (01/01 0425) BP: 154/76 mmHg (01/01 0809) Pulse Rate: 89 (01/01 0809)  Labs:  Recent Labs  06/13/13 1219 06/13/13 1243 06/13/13 1525 06/13/13 1720 06/14/13 0516  HGB 10.2* 12.9*  --   --  9.2*  HCT 35.0* 38.0*  --   --  31.8*  PLT 384  --   --   --  356  LABPROT  --   --   --  19.4* 22.6*  INR  --   --   --  1.69* 2.06*  CREATININE  --  7.20* 7.84*  --  5.24*    Estimated Creatinine Clearance: 18 ml/min (by C-G formula based on Cr of 5.24).   Medications:  Scheduled:  . allopurinol  100 mg Oral QHS  . calcium acetate  667 mg Oral TID WC  . [START ON 06/15/2013] ceFEPime (MAXIPIME) IV  2 g Intravenous Q M,W,F-HD  . doxercalciferol  2 mcg Intravenous Q T,Th,Sa-HD  . ferric gluconate (FERRLECIT/NULECIT) IV  62.5 mg Intravenous Q T,Th,Sa-HD  . gabapentin  300 mg Oral QHS  . insulin aspart  0-15 Units Subcutaneous TID WC  . insulin aspart  0-5 Units Subcutaneous QHS  . insulin aspart  20 Units Subcutaneous TID WC  . insulin glargine  25 Units Subcutaneous QHS  . ipratropium-albuterol  3 mL Nebulization QID  . lanthanum  2,000 mg Oral TID WC  . multivitamin  1 tablet Oral QHS  . pantoprazole  40 mg Oral Daily  . temazepam  30 mg Oral QHS  . vancomycin  2,000 mg Intravenous Once  . [START ON 06/15/2013] vancomycin  1,000 mg Intravenous Q M,W,F-HD  . warfarin  5 mg Oral ONCE-1800  . Warfarin - Pharmacist Dosing Inpatient   Does not apply q1800    Assessment: 63 y/o M presents with SOB and cough x 2d recently hospitalized this past month for dehiscence of L transtibial amputation. Admitted witih RUL pneumoniaPatient is ESRD (Holiday schedule [facility] holiday schedule for  TTS at [place]) with last HD 12/29. Symptoms improved with albuterol and SoluMedrol; on chronic coumadin for atrial fibrillation  Coumadin PTA for afib 5mg  daily INR on admit 12/31 1.69 given 7.5 mg  01/01 INR 2.06  Goal of Therapy:  INR 2-3 Monitor platelets by anticoagulation protocol: Yes   Plan:  D/c heparin sq Coumadin 5mg  x 1 tonight Daily PT/INR  Delorean Knutzen B. Leitha Schuller, PharmD Clinical Pharmacist - Resident Pager: [phone] Phone: [phone] 06/14/2013 12:13 PM

## 2013-06-15 DIAGNOSIS — J449 Chronic obstructive pulmonary disease, unspecified: Secondary | ICD-10-CM

## 2013-06-15 DIAGNOSIS — E1029 Type 1 diabetes mellitus with other diabetic kidney complication: Secondary | ICD-10-CM

## 2013-06-15 DIAGNOSIS — I5022 Chronic systolic (congestive) heart failure: Secondary | ICD-10-CM

## 2013-06-15 DIAGNOSIS — E1065 Type 1 diabetes mellitus with hyperglycemia: Secondary | ICD-10-CM

## 2013-06-15 DIAGNOSIS — E1059 Type 1 diabetes mellitus with other circulatory complications: Secondary | ICD-10-CM

## 2013-06-15 LAB — LEGIONELLA ANTIGEN, URINE: Legionella Antigen, Urine: NEGATIVE

## 2013-06-15 LAB — VANCOMYCIN, RANDOM: VANCOMYCIN RM: 16.7 ug/mL

## 2013-06-15 LAB — BASIC METABOLIC PANEL
BUN: 49 mg/dL — AB (ref 6–23)
CALCIUM: 9.2 mg/dL (ref 8.4–10.5)
CO2: 24 mEq/L (ref 19–32)
Chloride: 93 mEq/L — ABNORMAL LOW (ref 96–112)
Creatinine, Ser: 6.46 mg/dL — ABNORMAL HIGH (ref 0.50–1.35)
GFR calc Af Amer: 10 mL/min — ABNORMAL LOW (ref >90)
GFR, EST NON AFRICAN AMERICAN: 8 mL/min — AB (ref >90)
GLUCOSE: 241 mg/dL — AB (ref 70–99)
POTASSIUM: 4.1 meq/L (ref 3.7–5.3)
Sodium: 139 mEq/L (ref 137–147)

## 2013-06-15 LAB — GLUCOSE, CAPILLARY
GLUCOSE-CAPILLARY: 127 mg/dL — AB (ref 70–99)
GLUCOSE-CAPILLARY: 154 mg/dL — AB (ref 70–99)
Glucose-Capillary: 194 mg/dL — ABNORMAL HIGH (ref 70–99)
Glucose-Capillary: 86 mg/dL (ref 70–99)

## 2013-06-15 LAB — CBC
HCT: 31.8 % — ABNORMAL LOW (ref 39.0–52.0)
HEMOGLOBIN: 9.5 g/dL — AB (ref 13.0–17.0)
MCH: 25.7 pg — AB (ref 26.0–34.0)
MCHC: 29.9 g/dL — AB (ref 30.0–36.0)
MCV: 86.2 fL (ref 78.0–100.0)
Platelets: 353 10*3/uL (ref 150–400)
RBC: 3.69 MIL/uL — ABNORMAL LOW (ref 4.22–5.81)
RDW: 18.6 % — AB (ref 11.5–15.5)
WBC: 11.3 10*3/uL — ABNORMAL HIGH (ref 4.0–10.5)

## 2013-06-15 LAB — PROTIME-INR
INR: 2.77 — AB (ref 0.00–1.49)
Prothrombin Time: 28.3 seconds — ABNORMAL HIGH (ref 11.6–15.2)

## 2013-06-15 MED ORDER — DARBEPOETIN ALFA-POLYSORBATE 100 MCG/0.5ML IJ SOLN: 100.0000 ug | INTRAMUSCULAR | Status: DC

## 2013-06-15 MED ORDER — DEXTROSE 5 % IV SOLN
2.0000 g | Freq: Once | INTRAVENOUS | Status: AC
  Administered 2013-06-16: 2 g via INTRAVENOUS
  Filled 2013-06-15: qty 2

## 2013-06-15 MED ORDER — CEFEPIME HCL 2 G IJ SOLR: 2.0000 g | INTRAMUSCULAR | Status: DC

## 2013-06-15 MED ORDER — VANCOMYCIN HCL IN DEXTROSE 1-5 GM/200ML-% IV SOLN
1000.0000 mg | Freq: Once | INTRAVENOUS | Status: AC
  Administered 2013-06-16: 1000 mg via INTRAVENOUS
  Filled 2013-06-15 (×2): qty 200

## 2013-06-15 MED ORDER — VANCOMYCIN HCL IN DEXTROSE 1-5 GM/200ML-% IV SOLN: 1000.0000 mg | INTRAVENOUS | Status: DC

## 2013-06-15 MED ORDER — WARFARIN SODIUM 2.5 MG PO TABS
2.5000 mg | ORAL_TABLET | Freq: Once | ORAL | Status: AC
Start: 2013-06-15 — End: 2013-06-15
  Administered 2013-06-15: 2.5 mg via ORAL
  Filled 2013-06-15: qty 1

## 2013-06-15 MED ORDER — DEXTROSE 5 % IV SOLN: 2.0000 g | INTRAVENOUS | Status: DC

## 2013-06-15 MED ORDER — DARBEPOETIN ALFA-POLYSORBATE 100 MCG/0.5ML IJ SOLN
100.0000 ug | INTRAMUSCULAR | Status: DC
  Administered 2013-06-16: 100 ug via INTRAVENOUS
  Filled 2013-06-15: qty 0.5

## 2013-06-15 MED ORDER — VANCOMYCIN HCL IN DEXTROSE 750-5 MG/150ML-% IV SOLN
750.0000 mg | Freq: Once | INTRAVENOUS | Status: AC
  Administered 2013-06-15: 750 mg via INTRAVENOUS
  Filled 2013-06-15: qty 150

## 2013-06-15 NOTE — Progress Notes (Signed)
INITIAL NUTRITION ASSESSMENT  DOCUMENTATION CODES Per approved criteria  -Obesity Unspecified   INTERVENTION: RD to add HS snack of [place] sandwich. RD to continue to follow nutrition care plan.  NUTRITION DIAGNOSIS: Increased nutrient needs related to HD and acute illness as evidenced by estimated needs.   Goal: Intake to meet >90% of estimated nutrition needs.  Monitor:  weight trends, lab trends, I/O's, PO intake, supplement tolerance  Reason for Assessment: Malnutrition Screening Tool  63 y.o. male  Admitting Dx: Chronic airway obstruction, not elsewhere classified  ASSESSMENT: PMHx significant for ESRD on HD, active tobacco abuse, afib. Admitted with worsening cought. Work-up reveals RUL PNA.  Pt with L leg BKA, initially performed in August, underwent revisions in October and December. Pt confirms weight loss associated with hospitalizations and limb loss. Now has a great appetite, and is eating 100%. Would like more food to eat. Will add HS snack. Pt states that he has never had Nepro but will drink it if he has too; prefers to eat rather than get supplement shakes.  Height: Ht Readings from Last 1 Encounters:  06/13/13 6\' 1"  (1.854 m)    Weight: Wt Readings from Last 1 Encounters:  06/13/13 216 lb 4.3 oz (98.1 kg)    Ideal Body Weight: 169 lb  % Ideal Body Weight: 128%  Wt Readings from Last 10 Encounters:  06/13/13 216 lb 4.3 oz (98.1 kg)  05/31/13 227 lb 1.2 oz (103 kg)  05/31/13 227 lb 1.2 oz (103 kg)  05/16/13 234 lb (106.142 kg)  05/04/13 235 lb (106.595 kg)  05/04/13 235 lb (106.595 kg)  03/16/13 234 lb (106.142 kg)  03/02/13 232 lb 9.6 oz (105.507 kg)  02/03/13 232 lb 2.3 oz (105.3 kg)  02/03/13 232 lb 2.3 oz (105.3 kg)    Usual Body Weight: 230 lb (pre-amputation)  % Usual Body Weight: 94%  BMI:  31 - Obese Class I (adjusted for BKA)  Estimated Nutritional Needs: Kcal: 1800 - 2100 Protein: 95 - 110 g Fluid: 1.2 liters  Skin: leg  incision  Diet Order: Renal  EDUCATION NEEDS: -No education needs identified at this time   Intake/Output Summary (Last 24 hours) at 06/15/13 1302 Last data filed at 06/15/13 0844  Gross per 24 hour  Intake    250 ml  Output    325 ml  Net    -75 ml    Last BM: 12/31  Labs:   Recent Labs Lab 06/13/13 1243 06/13/13 1525 06/14/13 0516 06/15/13 0550  NA 138  --  139 139  K 4.0  --  4.5 4.1  CL 99  --  95* 93*  CO2  --   --  26 24  BUN 43*  --  32* 49*  CREATININE 7.20* 7.84* 5.24* 6.46*  CALCIUM  --   --  9.0 9.2  GLUCOSE 210*  --  399* 241*    CBG (last 3)   Recent Labs  06/14/13 2054 06/15/13 0716 06/15/13 1131  GLUCAP 230* 194* 86    Scheduled Meds: . allopurinol  100 mg Oral QHS  . calcium acetate  667 mg Oral TID WC  . [START ON 06/16/2013] ceFEPime (MAXIPIME) IV  2 g Intravenous Once  . [START ON 06/19/2013] ceFEPime (MAXIPIME) IV  2 g Intravenous Q T,Th,Sa-HD  . [START ON 06/16/2013] darbepoetin (ARANESP) injection - DIALYSIS  100 mcg Intravenous Q Sat-HD  . doxercalciferol  2 mcg Intravenous Q T,Th,Sa-HD  . ferric gluconate (FERRLECIT/NULECIT) IV  62.5 mg  Intravenous Q T,Th,Sa-HD  . gabapentin  300 mg Oral QHS  . insulin aspart  0-15 Units Subcutaneous TID WC  . insulin aspart  0-5 Units Subcutaneous QHS  . insulin aspart  20 Units Subcutaneous TID WC  . insulin glargine  25 Units Subcutaneous QHS  . ipratropium-albuterol  3 mL Nebulization QID  . lanthanum  2,000 mg Oral TID WC  . multivitamin  1 tablet Oral QHS  . pantoprazole  40 mg Oral Daily  . temazepam  30 mg Oral QHS  . vancomycin  2,000 mg Intravenous Once  . [START ON 06/16/2013] vancomycin  1,000 mg Intravenous Once  . [START ON 06/19/2013] vancomycin  1,000 mg Intravenous Q T,Th,Sa-HD  . vancomycin  750 mg Intravenous Once  . warfarin  2.5 mg Oral ONCE-1800  . Warfarin - Pharmacist Dosing Inpatient   Does not apply q1800    Continuous Infusions:   Past Medical History  Diagnosis Date   . GERD (gastroesophageal reflux disease)   . Hypertension   . Neuromuscular disorder     neurophathy  . Atrial fibrillation     chronically anticoagulated with coumadin  . Hyperlipidemia   . Atrial flutter 07/07/2009    s/p ablation  . GOUT 08/01/2008  . ERECTILE DYSFUNCTION 08/01/2008  . PERIPHERAL NEUROPATHY 08/01/2008  . CORONARY ARTERY DISEASE 08/01/2008    non obstructive per cath 1/10; Dr. Olevia Perches  . 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 Gastroparesis 08/01/2008  . NEPHROLITHIASIS, HX OF 08/01/2008  . Chronic systolic heart failure 09/19/5460  . COPD 07/21/2010  . Diverticulosis of colon (without mention of hemorrhage) 10/28/2010  . Benign neoplasm of colon 10/28/2010  . Hemorrhoids, internal 10/28/2010  . Second degree Mobitz I AV block     asymptomatic  . Cyst     "I've had 4-5; pretty much all of them have got infected" (06/13/2013)  . Muscle spasm of back     back muscle spasms and pt is unable to lay flat   . Abscess of back     lower middle portion of back  . Peripheral vascular disease     poor circulation  . Pneumonia     "first time I've been dx'd was today" (06/13/2013)  . Shortness of breath     "just related to this pneumonia" (06/13/2013)  . Type II diabetes mellitus dx'd 1980's  . Migraines     "none since the 1980's" (06/13/2013)  . End stage renal failure on dialysis 08/01/2008    "[place]; TTS" (06/13/2013)  . Renal cyst     bilateral  . Basal cell carcinoma     nose    Past Surgical History  Procedure Laterality Date  . Atrial ablation surgery  07-2008, 07/08/09    Dr Rayann Heman  . Foot surgery Right 09-2009    infection got into the bone; had to put rod in to hold foot together" (06/13/2013)  . Colonoscopy  2011?    Dr.Orr  . Av fistula placement Left 2010    upper arm  . Elbow fracture surgery Right 1974  . Pilonidal cyst excision  1971  . Cataract extraction Right ~ 2012  . I&d extremity Left 12/29/2012    Procedure:  IRRIGATION AND DEBRIDEMENT EXTREMITY;  Surgeon: Newt Minion, MD;  Location: [facility];  Service: Orthopedics;  Laterality: Left;  Irrigation and Debridement Left Leg, VAC, [place]  . Toe amputation Right 01/18/2013    "little toe" Dr Sharol Given  .  I&d extremity Bilateral 01/18/2013    Procedure: IRRIGATION AND DEBRIDEMENT EXTREMITYLEFT LEG AND RIGHT FOOT;  Surgeon: Newt Minion, MD;  Location: [facility];  Service: Orthopedics;  Laterality: Bilateral;  Left Leg Excisional Debridement  . Skin graft Left     left leg within the past month  . Amputation Left 02/02/2013    Procedure: AMPUTATION BELOW KNEE;  Surgeon: Newt Minion, MD;  Location: [place] [facility];  Service: Orthopedics;  Laterality: Left;  Left Below Knee Amputation  . Fracture surgery    . Vasectomy    . Amputation Left 03/23/2013    Procedure: REVISION OF BELOW THE KNEE  AMPUTATION ;  Surgeon: Newt Minion, MD;  Location: [facility];  Service: Orthopedics;  Laterality: Left;  Left Below Knee Amputation Revision  . I&d extremity Left 05/04/2013    Procedure: Irrigation and Debridement Left Below Knee Ampuation;  Surgeon: Newt Minion, MD;  Location: [facility];  Service: Orthopedics;  Laterality: Left;  Irrigation and Debridement Left Below Knee Ampuation  . Amputation Left 05/30/2013    Procedure: REVISION AMPUTATION BELOW KNEE;  Surgeon: Newt Minion, MD;  Location: [facility];  Service: Orthopedics;  Laterality: Left;  Revision Left Below Knee Amputation, Possible Above Knee Amputation  . Tonsillectomy      Inda Coke MS, RD, LDN Pager: [phone] After-hours pager: [phone]

## 2013-06-15 NOTE — Progress Notes (Addendum)
Subjective:  Feeling stronger Objective Vital signs in last 24 hours: Filed Vitals:   06/14/13 1304 06/14/13 2013 06/14/13 2145 06/15/13 0436  BP: 153/77 135/80  135/93  Pulse: 70 86 99 107  Temp: 97.3 F (36.3 C) 97.3 F (36.3 C)  97.7 F (36.5 C)  TempSrc:  Oral  Oral  Resp: 17 28 20 18   Height:      Weight:      SpO2: 100% 97% 99% 96%   Weight change:   Intake/Output Summary (Last 24 hours) at 06/15/13 0843 Last data filed at 06/15/13 0555  Gross per 24 hour  Intake    370 ml  Output    150 ml  Net    220 ml   Labs: Basic Metabolic Panel:  Recent Labs Lab 06/13/13 1243 06/13/13 1525 06/14/13 0516 06/15/13 0550  NA 138  --  139 139  K 4.0  --  4.5 4.1  CL 99  --  95* 93*  CO2  --   --  26 24  GLUCOSE 210*  --  399* 241*  BUN 43*  --  32* 49*  CREATININE 7.20* 7.84* 5.24* 6.46*  CALCIUM  --   --  9.0 9.2   Liver Function Tests:  Recent Labs Lab 06/14/13 0516  AST 16  ALT 9  ALKPHOS 76  BILITOT 0.4  PROT 6.9  ALBUMIN 3.3*   No results found for this basename: LIPASE, AMYLASE,  in the last 168 hours No results found for this basename: AMMONIA,  in the last 168 hours CBC:  Recent Labs Lab 06/13/13 1219 06/13/13 1243 06/14/13 0516 06/15/13 0550  WBC 9.1  --  5.5 11.3*  NEUTROABS 6.6  --   --   --   HGB 10.2* 12.9* 9.2* 9.5*  HCT 35.0* 38.0* 31.8* 31.8*  MCV 87.7  --  86.4 86.2  PLT 384  --  356 353   Cardiac Enzymes: No results found for this basename: CKTOTAL, CKMB, CKMBINDEX, TROPONINI,  in the last 168 hours CBG:  Recent Labs Lab 06/14/13 1130 06/14/13 1604 06/14/13 1649 06/14/13 2054 06/15/13 0716  GLUCAP 181* 41* 96 230* 194*    Iron Studies: No results found for this basename: IRON, TIBC, TRANSFERRIN, FERRITIN,  in the last 72 hours Studies/Results: Dg Chest 2 View  06/13/2013   CLINICAL DATA:  Cough, shortness of Breath  EXAM: CHEST  2 VIEW  COMPARISON:  12/29/2012  FINDINGS: Cardiomediastinal silhouette is stable. Central  mild bronchitic changes with worsening from prior exam. No pulmonary edema. Mild increased interstitial markings in right upper lobe. Atypical pneumonitis cannot be excluded.  IMPRESSION: Central mild bronchitic changes with worsening from prior exam. No pulmonary edema. Mild increased interstitial markings in right upper lobe. Atypical pneumonitis cannot be excluded.   Electronically Signed   By: Lahoma Crocker M.D.   On: 06/13/2013 13:03   Medications:   . allopurinol  100 mg Oral QHS  . calcium acetate  667 mg Oral TID WC  . [START ON 06/16/2013] ceFEPime (MAXIPIME) IV  2 g Intravenous Once  . [START ON 06/19/2013] ceFEPime (MAXIPIME) IV  2 g Intravenous Q T,Th,Sa-HD  . doxercalciferol  2 mcg Intravenous Q T,Th,Sa-HD  . ferric gluconate (FERRLECIT/NULECIT) IV  62.5 mg Intravenous Q T,Th,Sa-HD  . gabapentin  300 mg Oral QHS  . insulin aspart  0-15 Units Subcutaneous TID WC  . insulin aspart  0-5 Units Subcutaneous QHS  . insulin aspart  20 Units Subcutaneous TID WC  .  insulin glargine  25 Units Subcutaneous QHS  . ipratropium-albuterol  3 mL Nebulization QID  . lanthanum  2,000 mg Oral TID WC  . multivitamin  1 tablet Oral QHS  . pantoprazole  40 mg Oral Daily  . temazepam  30 mg Oral QHS  . vancomycin  2,000 mg Intravenous Once  . [START ON 06/16/2013] vancomycin  1,000 mg Intravenous Once  . [START ON 06/19/2013] vancomycin  1,000 mg Intravenous Q T,Th,Sa-HD  . warfarin  2.5 mg Oral ONCE-1800  . Warfarin - Pharmacist Dosing Inpatient   Does not apply q1800   Alert wm obese, NAD, appropriate Heart: RRR, no rub or mur  Lungs: Crackle RLL, bilat wheezing and decr BS throughout but improving air movemment Bilat diffuse exp wheezing, no distress Abdomen: Obese, bs pos. , soft , nontender  Extremities: L BKA stump wrapped clean and dry/ trace r pedal edema/ R 5th toe amp   Access: pos. Bruit L UA AVF   Dialysis: [facility]  4:15h   104kg    2K/1.25Ca  Heparin none   LUA AVF  BFR 400 Hectorol  2    Epogen 20K    Venofer 50mg /wk Labs: Hgb 8.6, < 9.8 <8.4 tfs 12% venofer loaded and weekly 50mg  now wkly   Assessment/Plan  1. PNA- nebs, abx, O2 per primary 2. ESRD- next HD Sat 3. Hypertension/volume - BP 135/93/ wt 98.1 post hd wed =6kg below prior dry wt, will  cont to challenge as BP allows 4. Anemia - HGB= 12.9> 9.2>9.5 Fu hgb/ restart ESA  /weekly venofer 5. Metabolic bone disease - hectorol and binder with meal 6. HO A. Fib- NSR on my exam- on coumadin per pharmacy  7. HO PVD-sp bka revision 05/30/13 sec to fall at home 8. Copd/ tobacco abuse   Ernest Haber, PA-C [place] [phone] 06/15/2013,8:43 AM  LOS: 2 days   I have seen and examined patient, discussed with PA and agree with assessment and plan as outlined above. Kelly Splinter MD pager [phone]    cell [phone] 06/15/2013, 11:29 AM

## 2013-06-15 NOTE — Progress Notes (Signed)
ANTIBIOTIC CONSULT NOTE - Follow-Up  Pharmacy Consult for Vancomycin, Cefepime Indication: HCAP  No Known Allergies  Patient Measurements: Height: 6\' 1"  (185.4 cm) Weight: 216 lb 4.3 oz (98.1 kg) IBW/kg (Calculated) : 79.9 Adjusted Body Weight:    Vital Signs: Temp: 98.2 F (36.8 C) (01/02 0843) Temp src: Oral (01/02 0436) BP: 136/71 mmHg (01/02 0843) Pulse Rate: 79 (01/02 0843) Intake/Output from previous day: 01/01 0701 - 01/02 0700 In: 370 [P.O.:370] Out: 150 [Urine:150] Intake/Output from this shift: Total I/O In: 100 [P.O.:100] Out: 175 [Urine:175]  Labs:  Recent Labs  06/13/13 1219  06/13/13 1243 06/13/13 1525 06/14/13 0516 06/15/13 0550  WBC 9.1  --   --   --  5.5 11.3*  HGB 10.2*  --  12.9*  --  9.2* 9.5*  PLT 384  --   --   --  356 353  CREATININE  --   < > 7.20* 7.84* 5.24* 6.46*  < > = values in this interval not displayed. Estimated Creatinine Clearance: 14.6 ml/min (by C-G formula based on Cr of 6.46).  Recent Labs  06/15/13 0733  VANCORANDOM 16.7     Microbiology: Recent Results (from the past 720 hour(s))  CULTURE, BLOOD (ROUTINE X 2)     Status: None   Collection Time    06/13/13  3:09 PM      Result Value Range Status   Specimen Description BLOOD HAND RIGHT   Final   Special Requests BOTTLES DRAWN AEROBIC AND ANAEROBIC 10CC   Final   Culture  Setup Time     Final   Value: 06/13/2013 23:34     Performed at [facility]   Culture     Final   Value:        BLOOD CULTURE RECEIVED NO GROWTH TO DATE CULTURE WILL BE HELD FOR 5 DAYS BEFORE ISSUING A FINAL NEGATIVE REPORT     Performed at [facility]   Report Status PENDING   Incomplete  CULTURE, BLOOD (ROUTINE X 2)     Status: None   Collection Time    06/13/13  3:16 PM      Result Value Range Status   Specimen Description BLOOD HAND RIGHT   Final   Special Requests BOTTLES DRAWN AEROBIC AND ANAEROBIC 10CC   Final   Culture  Setup Time     Final   Value: 06/13/2013  23:35     Performed at [facility]   Culture     Final   Value:        BLOOD CULTURE RECEIVED NO GROWTH TO DATE CULTURE WILL BE HELD FOR 5 DAYS BEFORE ISSUING A FINAL NEGATIVE REPORT     Performed at [facility]   Report Status PENDING   Incomplete    Medical History: Past Medical History  Diagnosis Date  . GERD (gastroesophageal reflux disease)   . Hypertension   . Neuromuscular disorder     neurophathy  . Atrial fibrillation     chronically anticoagulated with coumadin  . Hyperlipidemia   . Atrial flutter 07/07/2009    s/p ablation  . GOUT 08/01/2008  . ERECTILE DYSFUNCTION 08/01/2008  . PERIPHERAL NEUROPATHY 08/01/2008  . CORONARY ARTERY DISEASE 08/01/2008    non obstructive per cath 1/10; Dr. Olevia Perches  . 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 Gastroparesis 08/01/2008  . NEPHROLITHIASIS, HX OF 08/01/2008  . Chronic systolic heart failure 08/18/6281  . COPD 07/21/2010  .  Diverticulosis of colon (without mention of hemorrhage) 10/28/2010  . Benign neoplasm of colon 10/28/2010  . Hemorrhoids, internal 10/28/2010  . Second degree Mobitz I AV block     asymptomatic  . Cyst     "I've had 4-5; pretty much all of them have got infected" (06/13/2013)  . Muscle spasm of back     back muscle spasms and pt is unable to lay flat   . Abscess of back     lower middle portion of back  . Peripheral vascular disease     poor circulation  . Pneumonia     "first time I've been dx'd was today" (06/13/2013)  . Shortness of breath     "just related to this pneumonia" (06/13/2013)  . Type II diabetes mellitus dx'd 1980's  . Migraines     "none since the 1980's" (06/13/2013)  . End stage renal failure on dialysis 08/01/2008    "[place]; TTS" (06/13/2013)  . Renal cyst     bilateral  . Basal cell carcinoma     nose    Medications:  See med rec  Assessment: SOB, cough 63 y/o M presents with SOB and cough x 2d. Patient is ESRD with last HD 2 days ago.  Symptoms improved with albuterol and SoluMedrol.  Initial loading dose of vancomycin 2g was not charted, although reportedly given.  Checked random vancomycin level today to ensure it was given.  Random level = 16.7 (confirms dose was given).  Would have expected level to be closer to 22 based on population kinetics.  He does still have a little urine output documented.  Goal of Therapy:  Vanco level 15-25 prior to re-dosing post-HD.  Plan:  1. Will give vancomycin 750 mg IV x1 now to ensure he remains therapeutic until HD tomorrow. 2. Then continue vancomycin 1g IV q HD. 3. Continue cefepime 2g IV q HD. 4. Will continue to monitor cultures and clinical course.  Uvaldo Rising, BCPS  Clinical Pharmacist Pager [phone]  06/15/2013 12:52 PM

## 2013-06-15 NOTE — Progress Notes (Signed)
Utilization review completed.  

## 2013-06-15 NOTE — Progress Notes (Signed)
   CARE MANAGEMENT NOTE 06/15/2013  Patient:  [name], [name]   Account Number:  [id number]  Date Initiated:  06/15/2013  Documentation initiated by:  Lizabeth Leyden  Subjective/Objective Assessment:   admitted from home with increasing SOB and cough.  Recent INPT 05/30/2013-06/01/2013 dehisence of LBKA wound (BKA 8/14)  medical hx: ESRD/HD TTS     Action/Plan:   progression of care and discharge planning   Anticipated DC Date:  06/19/2013   Anticipated DC Plan:  HOME/SELF CARE         Choice offered to / List presented to:             Status of service:  In process, will continue to follow Medicare Important Message given?   (If response is "NO", the following Medicare IM given date fields will be blank) Date Medicare IM given:   Date Additional Medicare IM given:    Discharge Disposition:    Per UR Regulation:  Reviewed for med. necessity/level of care/duration of stay  If discussed at [place] of Stay Meetings, dates discussed:    Comments:

## 2013-06-15 NOTE — Progress Notes (Signed)
ANTICOAGULATION CONSULT NOTE - Follow Up Consult  Pharmacy Consult for Coumadin Indication: atrial fibrillation  No Known Allergies  Patient Measurements: Height: 6\' 1"  (185.4 cm) Weight: 216 lb 4.3 oz (98.1 kg) IBW/kg (Calculated) : 79.9  Vital Signs: Temp: 97.7 F (36.5 C) (01/02 0436) Temp src: Oral (01/02 0436) BP: 135/93 mmHg (01/02 0436) Pulse Rate: 107 (01/02 0436)  Labs:  Recent Labs  06/13/13 1219 06/13/13 1243 06/13/13 1525 06/13/13 1720 06/14/13 0516 06/15/13 0550  HGB 10.2* 12.9*  --   --  9.2* 9.5*  HCT 35.0* 38.0*  --   --  31.8* 31.8*  PLT 384  --   --   --  356 353  LABPROT  --   --   --  19.4* 22.6* 28.3*  INR  --   --   --  1.69* 2.06* 2.77*  CREATININE  --  7.20* 7.84*  --  5.24*  --     Estimated Creatinine Clearance: 18 ml/min (by C-G formula based on Cr of 5.24).   Medications:  Scheduled:  . allopurinol  100 mg Oral QHS  . calcium acetate  667 mg Oral TID WC  . [START ON 06/16/2013] ceFEPime (MAXIPIME) IV  2 g Intravenous Once  . [START ON 06/19/2013] ceFEPime (MAXIPIME) IV  2 g Intravenous Q T,Th,Sa-HD  . doxercalciferol  2 mcg Intravenous Q T,Th,Sa-HD  . ferric gluconate (FERRLECIT/NULECIT) IV  62.5 mg Intravenous Q T,Th,Sa-HD  . gabapentin  300 mg Oral QHS  . insulin aspart  0-15 Units Subcutaneous TID WC  . insulin aspart  0-5 Units Subcutaneous QHS  . insulin aspart  20 Units Subcutaneous TID WC  . insulin glargine  25 Units Subcutaneous QHS  . ipratropium-albuterol  3 mL Nebulization QID  . lanthanum  2,000 mg Oral TID WC  . multivitamin  1 tablet Oral QHS  . pantoprazole  40 mg Oral Daily  . temazepam  30 mg Oral QHS  . vancomycin  2,000 mg Intravenous Once  . [START ON 06/16/2013] vancomycin  1,000 mg Intravenous Once  . [START ON 06/19/2013] vancomycin  1,000 mg Intravenous Q T,Th,Sa-HD  . warfarin  2.5 mg Oral ONCE-1800  . Warfarin - Pharmacist Dosing Inpatient   Does not apply q1800    Assessment: 63 y/o M presents with SOB  and cough x 2d recently hospitalized this past month for dehiscence of L transtibial amputation. Admitted witih RUL pneumonia. Patient is ESRD (Holiday schedule MWSat holiday schedule for TTS at [place]) with last HD 12/29. Symptoms improved with albuterol and SoluMedrol; on chronic coumadin for atrial fibrillation  Coumadin PTA for afib 5mg  daily INR on admit 12/31 1.69 given 7.5 mg 01/01 INR 2.06 - given 5 mg 01/02 INR 2.77 - unsure of reason for large increase - maybe effect of antibiotic  Goal of Therapy:  INR 2-3 Monitor platelets by anticoagulation protocol: Yes   Plan:  Coumadin 2.5 mg x 1 tonight Daily PT/INR  Uvaldo Rising, BCPS  Clinical Pharmacist Pager [phone]  06/15/2013 8:27 AM

## 2013-06-15 NOTE — Progress Notes (Signed)
[facility]                                                                              Patient Demographics  [name], is a 63 y.o. male, DOB - [birth year], JME:[id number]  Admit date - 06/13/2013   Admitting Physician Donne Hazel, MD  Outpatient Primary MD for the patient is Renato Shin, MD  LOS - 2   Chief Complaint  Patient presents with  . Shortness of Breath        Assessment & Plan    Principal Problem:   COPD Active Problems:   DIABETES MELLITUS, TYPE II   GOUT   PERIPHERAL NEUROPATHY   Atrial fibrillation   Chronic systolic heart failure   ESRD on dialysis   Healthcare-associated pneumonia   HCAP (healthcare-associated pneumonia)  COPD exacerbation secondary to healthcare acquired pneumonia -Patient recently hospitalized for left transtibial amputation and dehiscence -Will continue vancomycin and cefepime and neb treatments, and supplemental O2 -Patient currently afebrile -WBC 11.3, will continue to monitor -Urine strep pneumonia antigen negative -Blood cultures negative  End-stage disease requiring hemodialysis -Nephrology consulted and following -Patient normally dialyzes on Tuesday Thursday Saturday -Continue renavit, Hectorol, phoslo  Atrial fibrillation -Currently rate controlled -Continue Coumadin with pharmacy to follow and dose -INR is 2.77  Anemia likely secondary to chronic disease -Hemoglobin 9.5, currently stable -Will continue to monitor CBC -Continue ferrous gluconate  Tobacco abuse -Smoking cessation counseling ordered  Diabetes mellitus with neuropathy -Continue Lantus and insulin sliding scale with CBG monitoring -Continue gabapentin for neuropathy  Gout -Continue allopurinol  GERD -Continue Protonix  Code Status: Full  Family Communication: None at bedside  Disposition Plan: Admitted, will likely discharge 06/16/2013   Procedures  None  Consults   Nephrology  DVT Prophylaxis  Warfarin  Lab Results  Component Value Date   PLT 353 06/15/2013    Medications  Scheduled Meds: . allopurinol  100 mg Oral QHS  . calcium acetate  667 mg Oral TID WC  . [START ON 06/16/2013] ceFEPime (MAXIPIME) IV  2 g Intravenous Once  . [START ON 06/19/2013] ceFEPime (MAXIPIME) IV  2 g Intravenous Q T,Th,Sa-HD  . [START ON 06/16/2013] darbepoetin (ARANESP) injection - DIALYSIS  100 mcg Intravenous Q Sat-HD  . doxercalciferol  2 mcg Intravenous Q T,Th,Sa-HD  . ferric gluconate (FERRLECIT/NULECIT) IV  62.5 mg Intravenous Q T,Th,Sa-HD  . gabapentin  300 mg Oral QHS  . insulin aspart  0-15 Units Subcutaneous TID WC  . insulin aspart  0-5 Units Subcutaneous QHS  . insulin aspart  20 Units Subcutaneous TID WC  . insulin glargine  25 Units Subcutaneous QHS  . ipratropium-albuterol  3 mL Nebulization QID  . lanthanum  2,000 mg Oral TID WC  . multivitamin  1 tablet Oral QHS  . pantoprazole  40 mg Oral Daily  . temazepam  30 mg Oral QHS  . vancomycin  2,000 mg Intravenous Once  . [START ON 06/16/2013] vancomycin  1,000 mg Intravenous Once  . [START ON 06/19/2013] vancomycin  1,000 mg Intravenous Q T,Th,Sa-HD  . warfarin  2.5 mg Oral ONCE-1800  . Warfarin - Pharmacist Dosing Inpatient   Does not apply [phone]  Continuous Infusions:  PRN Meds:.acetaminophen, acetaminophen, HYDROcodone-acetaminophen, ipratropium-albuterol, loperamide, morphine injection, ondansetron (ZOFRAN) IV, ondansetron  Antibiotics    Anti-infectives   Start     Dose/Rate Route Frequency Ordered Stop   06/19/13 1200  ceFEPIme (MAXIPIME) 2 g in dextrose 5 % 50 mL IVPB     2 g 100 mL/hr over 30 Minutes Intravenous Every T-Th-Sa (Hemodialysis) 06/15/13 0825     06/19/13 1200  vancomycin (VANCOCIN) IVPB 1000 mg/200 mL premix     1,000 mg 200 mL/hr over 60 Minutes Intravenous Every T-Th-Sa (Hemodialysis) 06/15/13 0825     06/18/13 1200  vancomycin (VANCOCIN) IVPB 1000 mg/200 mL premix  Status:  Discontinued     1,000 mg 200  mL/hr over 60 Minutes Intravenous Every M-W-F (Hemodialysis) 06/15/13 0821 06/15/13 0825   06/18/13 1200  ceFEPIme (MAXIPIME) 2 g in dextrose 5 % 50 mL IVPB  Status:  Discontinued     2 g 100 mL/hr over 30 Minutes Intravenous Every M-W-F (Hemodialysis) 06/15/13 0823 06/15/13 0825   06/16/13 1200  vancomycin (VANCOCIN) IVPB 1000 mg/200 mL premix     1,000 mg 200 mL/hr over 60 Minutes Intravenous  Once 06/15/13 0821     06/16/13 1200  ceFEPIme (MAXIPIME) 2 g in dextrose 5 % 50 mL IVPB     2 g 100 mL/hr over 30 Minutes Intravenous  Once 06/15/13 0823     06/15/13 1300  vancomycin (VANCOCIN) IVPB 750 mg/150 ml premix     750 mg 150 mL/hr over 60 Minutes Intravenous  Once 06/15/13 1254 06/15/13 1400   06/15/13 1200  vancomycin (VANCOCIN) IVPB 1000 mg/200 mL premix  Status:  Discontinued     1,000 mg 200 mL/hr over 60 Minutes Intravenous Every M-W-F (Hemodialysis) 06/13/13 1459 06/15/13 0821   06/15/13 1200  ceFEPIme (MAXIPIME) 2 g in dextrose 5 % 50 mL IVPB  Status:  Discontinued     2 g 100 mL/hr over 30 Minutes Intravenous Every M-W-F (Hemodialysis) 06/13/13 1459 06/15/13 0823   06/13/13 1615  vancomycin (VANCOCIN) 2,000 mg in sodium chloride 0.9 % 500 mL IVPB     2,000 mg 250 mL/hr over 120 Minutes Intravenous  Once 06/13/13 1547     06/13/13 1600  vancomycin (VANCOCIN) 2,000 mg in sodium chloride 0.9 % 250 mL IVPB  Status:  Discontinued     2,000 mg 250 mL/hr over 60 Minutes Intravenous  Once 06/13/13 1538 06/13/13 1546   06/13/13 1530  ceFEPIme (MAXIPIME) 1 g in dextrose 5 % 50 mL IVPB  Status:  Discontinued     1 g 100 mL/hr over 30 Minutes Intravenous 3 times per day 06/13/13 1526 06/13/13 1530   06/13/13 1530  oseltamivir (TAMIFLU) capsule 30 mg     30 mg Oral  Once 06/13/13 1515 06/13/13 1525   06/13/13 1500  oseltamivir (TAMIFLU) capsule 75 mg  Status:  Discontinued     75 mg Oral  Once 06/13/13 1445 06/13/13 1515   06/13/13 1500  vancomycin (VANCOCIN) 2,300 mg in sodium chloride  0.9 % 250 mL IVPB  Status:  Discontinued     2,300 mg 250 mL/hr over 60 Minutes Intravenous  Once 06/13/13 1458 06/13/13 1538   06/13/13 1500  ceFEPIme (MAXIPIME) 2 g in dextrose 5 % 50 mL IVPB     2 g 100 mL/hr over 30 Minutes Intravenous  Once 06/13/13 1458 06/14/13 0008   06/13/13 1430  ceFEPIme (MAXIPIME) 1 g in dextrose 5 % 50 mL IVPB  Status:  Discontinued  1 g 100 mL/hr over 30 Minutes Intravenous 3 times per day 06/13/13 1424 06/13/13 1450       Time Spent in minutes   30 minutes   Orest Dygert D.O. on 06/15/2013 at 2:22 PM  Between 7am to 7pm - Pager - [phone]  After 7pm go to [email or web address] - password [email or web address]  And look for the night coverage person covering for me after hours  [facility] Office  [phone]    Subjective:   [name] seen and examined today.  Patient continues to have shortness of breath and cough however improving.  Objective:   Filed Vitals:   06/15/13 0843 06/15/13 0918 06/15/13 1239 06/15/13 1334  BP: 136/71   138/89  Pulse: 79   90  Temp: 98.2 F (36.8 C)   97.4 F (36.3 C)  TempSrc:      Resp: 17   18  Height:      Weight:      SpO2: 97% 96% 95% 96%    Wt Readings from Last 3 Encounters:  06/13/13 98.1 kg (216 lb 4.3 oz)  05/31/13 103 kg (227 lb 1.2 oz)  05/31/13 103 kg (227 lb 1.2 oz)     Intake/Output Summary (Last 24 hours) at 06/15/13 1422 Last data filed at 06/15/13 1334  Gross per 24 hour  Intake    340 ml  Output    325 ml  Net     15 ml    Exam  General: Well developed, well nourished, NAD, appears stated age  HEENT: NCAT, PERRLA, EOMI, Anicteic Sclera, mucous membranes moist.   Neck: Supple, no JVD, no masses  Cardiovascular: S1 S2 auscultated, no rubs, murmurs or gallops. Regular rate and rhythm.  Respiratory: Crackle noted in RLL.  Decreased breath sounds throughout  Abdomen: Soft, obese, nontender, nondistended, + bowel sounds  Extremities: RLE: warm dry without cyanosis  clubbing, trace edema, 5th toe amputation.  L BKA, currently wrapped.  Left upper extremity AV fistula with good bruit and thrill  Neuro: AAOx3, cranial nerves grossly intact.   Skin: Without rashes exudates or nodules  Psych: Normal affect and demeanor with intact judgement and insight  Data Review   Micro Results Recent Results (from the past 240 hour(s))  CULTURE, BLOOD (ROUTINE X 2)     Status: None   Collection Time    06/13/13  3:09 PM      Result Value Range Status   Specimen Description BLOOD HAND RIGHT   Final   Special Requests BOTTLES DRAWN AEROBIC AND ANAEROBIC 10CC   Final   Culture  Setup Time     Final   Value: 06/13/2013 23:34     Performed at [facility]   Culture     Final   Value:        BLOOD CULTURE RECEIVED NO GROWTH TO DATE CULTURE WILL BE HELD FOR 5 DAYS BEFORE ISSUING A FINAL NEGATIVE REPORT     Performed at [facility]   Report Status PENDING   Incomplete  CULTURE, BLOOD (ROUTINE X 2)     Status: None   Collection Time    06/13/13  3:16 PM      Result Value Range Status   Specimen Description BLOOD HAND RIGHT   Final   Special Requests BOTTLES DRAWN AEROBIC AND ANAEROBIC 10CC   Final   Culture  Setup Time     Final   Value: 06/13/2013 23:35     Performed at [facility]   Culture     Final   Value:        BLOOD CULTURE RECEIVED NO GROWTH TO DATE CULTURE WILL BE HELD FOR 5 DAYS BEFORE ISSUING A FINAL NEGATIVE REPORT     Performed at [facility]   Report Status PENDING   Incomplete    Radiology Reports Dg Chest 2 View  06/13/2013   CLINICAL DATA:  Cough, shortness of Breath  EXAM: CHEST  2 VIEW  COMPARISON:  12/29/2012  FINDINGS: Cardiomediastinal silhouette is stable. Central mild bronchitic changes with worsening from prior exam. No pulmonary edema. Mild increased interstitial markings in right upper lobe. Atypical pneumonitis cannot be excluded.  IMPRESSION: Central mild bronchitic changes with worsening from  prior exam. No pulmonary edema. Mild increased interstitial markings in right upper lobe. Atypical pneumonitis cannot be excluded.   Electronically Signed   By: Lahoma Crocker M.D.   On: 06/13/2013 13:03    CBC  Recent Labs Lab 06/13/13 1219 06/13/13 1243 06/14/13 0516 06/15/13 0550  WBC 9.1  --  5.5 11.3*  HGB 10.2* 12.9* 9.2* 9.5*  HCT 35.0* 38.0* 31.8* 31.8*  PLT 384  --  356 353  MCV 87.7  --  86.4 86.2  MCH 25.6*  --  25.0* 25.7*  MCHC 29.1*  --  28.9* 29.9*  RDW 18.7*  --  18.5* 18.6*  LYMPHSABS 1.9  --   --   --   MONOABS 0.5  --   --   --   EOSABS 0.0  --   --   --   BASOSABS 0.1  --   --   --     Chemistries   Recent Labs Lab 06/13/13 1243 06/13/13 1525 06/14/13 0516 06/15/13 0550  NA 138  --  139 139  K 4.0  --  4.5 4.1  CL 99  --  95* 93*  CO2  --   --  26 24  GLUCOSE 210*  --  399* 241*  BUN 43*  --  32* 49*  CREATININE 7.20* 7.84* 5.24* 6.46*  CALCIUM  --   --  9.0 9.2  AST  --   --  16  --   ALT  --   --  9  --   ALKPHOS  --   --  76  --   BILITOT  --   --  0.4  --    ------------------------------------------------------------------------------------------------------------------ estimated creatinine clearance is 14.6 ml/min (by C-G formula based on Cr of 6.46). ------------------------------------------------------------------------------------------------------------------ No results found for this basename: HGBA1C,  in the last 72 hours ------------------------------------------------------------------------------------------------------------------ No results found for this basename: CHOL, HDL, LDLCALC, TRIG, CHOLHDL, LDLDIRECT,  in the last 72 hours ------------------------------------------------------------------------------------------------------------------ No results found for this basename: TSH, T4TOTAL, FREET3, T3FREE, THYROIDAB,  in the last 72  hours ------------------------------------------------------------------------------------------------------------------ No results found for this basename: VITAMINB12, FOLATE, FERRITIN, TIBC, IRON, RETICCTPCT,  in the last 72 hours  Coagulation profile  Recent Labs Lab 06/13/13 1720 06/14/13 0516 06/15/13 0550  INR 1.69* 2.06* 2.77*    No results found for this basename: DDIMER,  in the last 72 hours  Cardiac Enzymes No results found for this basename: CK, CKMB, TROPONINI, MYOGLOBIN,  in the last 168 hours ------------------------------------------------------------------------------------------------------------------ No components found with this basename: POCBNP,

## 2013-06-16 DIAGNOSIS — K219 Gastro-esophageal reflux disease without esophagitis: Secondary | ICD-10-CM

## 2013-06-16 LAB — BASIC METABOLIC PANEL
BUN: 62 mg/dL — ABNORMAL HIGH (ref 6–23)
CO2: 23 mEq/L (ref 19–32)
Calcium: 8.8 mg/dL (ref 8.4–10.5)
Chloride: 92 mEq/L — ABNORMAL LOW (ref 96–112)
Creatinine, Ser: 7.1 mg/dL — ABNORMAL HIGH (ref 0.50–1.35)
GFR calc Af Amer: 9 mL/min — ABNORMAL LOW (ref >90)
GFR calc non Af Amer: 7 mL/min — ABNORMAL LOW (ref >90)
Glucose, Bld: 160 mg/dL — ABNORMAL HIGH (ref 70–99)
Potassium: 4.6 mEq/L (ref 3.7–5.3)
Sodium: 135 mEq/L — ABNORMAL LOW (ref 137–147)

## 2013-06-16 LAB — GLUCOSE, CAPILLARY
GLUCOSE-CAPILLARY: 280 mg/dL — AB (ref 70–99)
Glucose-Capillary: 151 mg/dL — ABNORMAL HIGH (ref 70–99)
Glucose-Capillary: 243 mg/dL — ABNORMAL HIGH (ref 70–99)
Glucose-Capillary: 376 mg/dL — ABNORMAL HIGH (ref 70–99)

## 2013-06-16 LAB — PHOSPHORUS: Phosphorus: 6.6 mg/dL — ABNORMAL HIGH (ref 2.3–4.6)

## 2013-06-16 LAB — CBC
HCT: 32.6 % — ABNORMAL LOW (ref 39.0–52.0)
Hemoglobin: 9.5 g/dL — ABNORMAL LOW (ref 13.0–17.0)
MCH: 25.1 pg — ABNORMAL LOW (ref 26.0–34.0)
MCHC: 29.1 g/dL — ABNORMAL LOW (ref 30.0–36.0)
MCV: 86 fL (ref 78.0–100.0)
Platelets: 370 10*3/uL (ref 150–400)
RBC: 3.79 MIL/uL — ABNORMAL LOW (ref 4.22–5.81)
RDW: 18.3 % — ABNORMAL HIGH (ref 11.5–15.5)
WBC: 11.7 10*3/uL — ABNORMAL HIGH (ref 4.0–10.5)

## 2013-06-16 LAB — OCCULT BLOOD X 1 CARD TO LAB, STOOL: Fecal Occult Bld: NEGATIVE

## 2013-06-16 LAB — PROTIME-INR
INR: 2.64 — AB (ref 0.00–1.49)
PROTHROMBIN TIME: 27.3 s — AB (ref 11.6–15.2)

## 2013-06-16 MED ORDER — WARFARIN SODIUM 2.5 MG PO TABS
2.5000 mg | ORAL_TABLET | Freq: Once | ORAL | Status: AC
  Administered 2013-06-16: 2.5 mg via ORAL
  Filled 2013-06-16: qty 1

## 2013-06-16 MED ORDER — GUAIFENESIN ER 600 MG PO TB12
600.0000 mg | ORAL_TABLET | Freq: Two times a day (BID) | ORAL | Status: DC | PRN
  Administered 2013-06-16: 600 mg via ORAL
  Filled 2013-06-16: qty 1

## 2013-06-16 MED ORDER — DARBEPOETIN ALFA-POLYSORBATE 100 MCG/0.5ML IJ SOLN
INTRAMUSCULAR | Status: AC
  Filled 2013-06-16: qty 0.5

## 2013-06-16 MED ORDER — TIOTROPIUM BROMIDE MONOHYDRATE 18 MCG IN CAPS
18.0000 ug | ORAL_CAPSULE | Freq: Every day | RESPIRATORY_TRACT | Status: DC
  Administered 2013-06-17: 18 ug via RESPIRATORY_TRACT
  Filled 2013-06-16: qty 5

## 2013-06-16 MED ORDER — METHYLPREDNISOLONE SODIUM SUCC 125 MG IJ SOLR
125.0000 mg | Freq: Once | INTRAMUSCULAR | Status: AC
  Administered 2013-06-16: 125 mg via INTRAVENOUS
  Filled 2013-06-16: qty 2

## 2013-06-16 MED ORDER — DOXERCALCIFEROL 4 MCG/2ML IV SOLN
INTRAVENOUS | Status: AC
  Filled 2013-06-16: qty 2

## 2013-06-16 NOTE — Procedures (Signed)
I was present at this dialysis session, have reviewed the session itself and made  appropriate changes  Kelly Splinter MD (pgr) [phone]    ([phone] 06/16/2013, 9:57 AM

## 2013-06-16 NOTE — Progress Notes (Signed)
ANTICOAGULATION CONSULT NOTE - Follow Up Consult  Pharmacy Consult for Coumadin Indication: atrial fibrillation  No Known Allergies  Patient Measurements: Height: 6\' 1"  (185.4 cm) Weight: 227 lb 4.7 oz (103.1 kg) IBW/kg (Calculated) : 79.9  Vital Signs: Temp: 97.6 F (36.4 C) (01/03 1226) Temp src: Axillary (01/03 1226) BP: 153/85 mmHg (01/03 1226) Pulse Rate: 92 (01/03 1226)  Labs:  Recent Labs  06/14/13 0516 06/15/13 0550 06/16/13 0421  HGB 9.2* 9.5* 9.5*  HCT 31.8* 31.8* 32.6*  PLT 356 353 370  LABPROT 22.6* 28.3* 27.3*  INR 2.06* 2.77* 2.64*  CREATININE 5.24* 6.46* 7.10*    Estimated Creatinine Clearance: 13.6 ml/min (by C-G formula based on Cr of 7.1).   Medications:  Scheduled:  . allopurinol  100 mg Oral QHS  . calcium acetate  667 mg Oral TID WC  . ceFEPime (MAXIPIME) IV  2 g Intravenous Once  . [START ON 06/19/2013] ceFEPime (MAXIPIME) IV  2 g Intravenous Q T,Th,Sa-HD  . darbepoetin (ARANESP) injection - DIALYSIS  100 mcg Intravenous Q Sat-HD  . doxercalciferol  2 mcg Intravenous Q T,Th,Sa-HD  . ferric gluconate (FERRLECIT/NULECIT) IV  62.5 mg Intravenous Q T,Th,Sa-HD  . gabapentin  300 mg Oral QHS  . insulin aspart  0-15 Units Subcutaneous TID WC  . insulin aspart  0-5 Units Subcutaneous QHS  . insulin aspart  20 Units Subcutaneous TID WC  . insulin glargine  25 Units Subcutaneous QHS  . ipratropium-albuterol  3 mL Nebulization QID  . lanthanum  2,000 mg Oral TID WC  . multivitamin  1 tablet Oral QHS  . pantoprazole  40 mg Oral Daily  . temazepam  30 mg Oral QHS  . vancomycin  2,000 mg Intravenous Once  . [START ON 06/19/2013] vancomycin  1,000 mg Intravenous Q T,Th,Sa-HD  . Warfarin - Pharmacist Dosing Inpatient   Does not apply q1800    Assessment: 63 y/o M presents with SOB and cough x 2d recently hospitalized this past month for dehiscence of L transtibial amputation. Admitted witih RUL pneumonia. Patient is ESRD (Holiday schedule MWSat holiday  schedule for TTS at [place]) with last HD 1/3. Symptoms improved with albuterol and SoluMedrol; on chronic coumadin for atrial fibrillation  Coumadin PTA for afib 5mg  daily INR on admit 12/31 1.69 given 7.5 mg 01/01 INR 2.06 - given 5 mg 01/02 INR 2.77 - unsure of reason for large increase - maybe effect of antibiotic -- given 2.5 mg 01/03 INR 2.64   Goal of Therapy:  INR 2-3 Monitor platelets by anticoagulation protocol: Yes   Plan:  - Coumadin 2.5 mg x 1 tonight - Daily PT/INR  Harolyn Rutherford, PharmD Clinical Pharmacist - Resident Pager: [phone] Pharmacy: [phone] 06/16/2013 12:29 PM

## 2013-06-16 NOTE — Progress Notes (Addendum)
[facility]                                                                              Patient Demographics  [name], is a 63 y.o. male, DOB - 09-Oct-1950, [id number]  Admit date - 06/13/2013   Admitting Physician Donne Hazel, MD  Outpatient Primary MD for the patient is Renato Shin, MD  LOS - 3   Chief Complaint  Patient presents with  . Shortness of Breath      Interim history 63 year old male history of diabetes, hypertension, end-stage renal disease, active tobacco use and atrial fibrillation presents emergency department with worsening shortness of breath and cough for approximately 2-3 days. Patient does not take any medications for COPD home. Was given steroids as well as nebulizer treatments and has improved slightly. Patient will likely need a prolonged dose of steroids at home.  Assessment & Plan    Principal Problem:   COPD Active Problems:   DIABETES MELLITUS, TYPE II   GOUT   PERIPHERAL NEUROPATHY   Atrial fibrillation   Chronic systolic heart failure   ESRD on dialysis   Healthcare-associated pneumonia   HCAP (healthcare-associated pneumonia)  COPD exacerbation secondary to healthcare acquired pneumonia -Patient recently hospitalized for left transtibial amputation and dehiscence -Will continue vancomycin and cefepime and neb treatments, and supplemental O2 -Patient currently afebrile -WBC 11.7, will continue to monitor -Urine strep pneumonia antigen negative -Blood cultures negative -patient is not taking any medications at home for COPD and continues to smoke -will start on spiriva as well as give solumedrol 125mg  now, will likely need a prolonged course of steroids at home  End-stage disease requiring hemodialysis -Nephrology consulted and following -Patient normally dialyzes on Tuesday Thursday Saturday -Continue renavit, Hectorol, phoslo  Atrial fibrillation -Currently rate controlled -Continue Coumadin with pharmacy  to follow and dose -INR is 2.64  Anemia likely secondary to chronic disease -Hemoglobin 9.5, currently stable -Will continue to monitor CBC -Continue ferrous gluconate  Tobacco abuse -Smoking cessation counseling ordered  Diabetes mellitus with neuropathy -Continue Lantus and insulin sliding scale with CBG monitoring -Continue gabapentin for neuropathy  Gout -Continue allopurinol  GERD -Continue Protonix  Code Status: Full  Family Communication: None at bedside  Disposition Plan: Admitted, will likely discharge 06/16/2013   Procedures  None  Consults   Nephrology  DVT Prophylaxis Warfarin  Lab Results  Component Value Date   PLT 370 06/16/2013    Medications  Scheduled Meds: . allopurinol  100 mg Oral QHS  . calcium acetate  667 mg Oral TID WC  . ceFEPime (MAXIPIME) IV  2 g Intravenous Once  . [START ON 06/19/2013] ceFEPime (MAXIPIME) IV  2 g Intravenous Q T,Th,Sa-HD  . darbepoetin (ARANESP) injection - DIALYSIS  100 mcg Intravenous Q Sat-HD  . doxercalciferol  2 mcg Intravenous Q T,Th,Sa-HD  . ferric gluconate (FERRLECIT/NULECIT) IV  62.5 mg Intravenous Q T,Th,Sa-HD  . gabapentin  300 mg Oral QHS  . insulin aspart  0-15 Units Subcutaneous TID WC  . insulin aspart  0-5 Units Subcutaneous QHS  . insulin aspart  20 Units Subcutaneous TID WC  . insulin glargine  25 Units Subcutaneous QHS  .  ipratropium-albuterol  3 mL Nebulization QID  . lanthanum  2,000 mg Oral TID WC  . multivitamin  1 tablet Oral QHS  . pantoprazole  40 mg Oral Daily  . temazepam  30 mg Oral QHS  . vancomycin  2,000 mg Intravenous Once  . [START ON 06/19/2013] vancomycin  1,000 mg Intravenous Q T,Th,Sa-HD  . warfarin  2.5 mg Oral ONCE-1800  . Warfarin - Pharmacist Dosing Inpatient   Does not apply q1800   Continuous Infusions:  PRN Meds:.acetaminophen, acetaminophen, HYDROcodone-acetaminophen, ipratropium-albuterol, loperamide, morphine injection, ondansetron (ZOFRAN) IV,  ondansetron  Antibiotics    Anti-infectives   Start     Dose/Rate Route Frequency Ordered Stop   06/19/13 1200  ceFEPIme (MAXIPIME) 2 g in dextrose 5 % 50 mL IVPB     2 g 100 mL/hr over 30 Minutes Intravenous Every T-Th-Sa (Hemodialysis) 06/15/13 0825     06/19/13 1200  vancomycin (VANCOCIN) IVPB 1000 mg/200 mL premix     1,000 mg 200 mL/hr over 60 Minutes Intravenous Every T-Th-Sa (Hemodialysis) 06/15/13 0825     06/18/13 1200  vancomycin (VANCOCIN) IVPB 1000 mg/200 mL premix  Status:  Discontinued     1,000 mg 200 mL/hr over 60 Minutes Intravenous Every M-W-F (Hemodialysis) 06/15/13 0821 06/15/13 0825   06/18/13 1200  ceFEPIme (MAXIPIME) 2 g in dextrose 5 % 50 mL IVPB  Status:  Discontinued     2 g 100 mL/hr over 30 Minutes Intravenous Every M-W-F (Hemodialysis) 06/15/13 0823 06/15/13 0825   06/16/13 1200  vancomycin (VANCOCIN) IVPB 1000 mg/200 mL premix     1,000 mg 200 mL/hr over 60 Minutes Intravenous  Once 06/15/13 0821 06/16/13 1152   06/16/13 1200  ceFEPIme (MAXIPIME) 2 g in dextrose 5 % 50 mL IVPB     2 g 100 mL/hr over 30 Minutes Intravenous  Once 06/15/13 0823     06/15/13 1300  vancomycin (VANCOCIN) IVPB 750 mg/150 ml premix     750 mg 150 mL/hr over 60 Minutes Intravenous  Once 06/15/13 1254 06/15/13 1400   06/15/13 1200  vancomycin (VANCOCIN) IVPB 1000 mg/200 mL premix  Status:  Discontinued     1,000 mg 200 mL/hr over 60 Minutes Intravenous Every M-W-F (Hemodialysis) 06/13/13 1459 06/15/13 0821   06/15/13 1200  ceFEPIme (MAXIPIME) 2 g in dextrose 5 % 50 mL IVPB  Status:  Discontinued     2 g 100 mL/hr over 30 Minutes Intravenous Every M-W-F (Hemodialysis) 06/13/13 1459 06/15/13 0823   06/13/13 1615  vancomycin (VANCOCIN) 2,000 mg in sodium chloride 0.9 % 500 mL IVPB     2,000 mg 250 mL/hr over 120 Minutes Intravenous  Once 06/13/13 1547     06/13/13 1600  vancomycin (VANCOCIN) 2,000 mg in sodium chloride 0.9 % 250 mL IVPB  Status:  Discontinued     2,000 mg 250  mL/hr over 60 Minutes Intravenous  Once 06/13/13 1538 06/13/13 1546   06/13/13 1530  ceFEPIme (MAXIPIME) 1 g in dextrose 5 % 50 mL IVPB  Status:  Discontinued     1 g 100 mL/hr over 30 Minutes Intravenous 3 times per day 06/13/13 1526 06/13/13 1530   06/13/13 1530  oseltamivir (TAMIFLU) capsule 30 mg     30 mg Oral  Once 06/13/13 1515 06/13/13 1525   06/13/13 1500  oseltamivir (TAMIFLU) capsule 75 mg  Status:  Discontinued     75 mg Oral  Once 06/13/13 1445 06/13/13 1515   06/13/13 1500  vancomycin (VANCOCIN) 2,300 mg in sodium  chloride 0.9 % 250 mL IVPB  Status:  Discontinued     2,300 mg 250 mL/hr over 60 Minutes Intravenous  Once 06/13/13 1458 06/13/13 1538   06/13/13 1500  ceFEPIme (MAXIPIME) 2 g in dextrose 5 % 50 mL IVPB     2 g 100 mL/hr over 30 Minutes Intravenous  Once 06/13/13 1458 06/14/13 0008   06/13/13 1430  ceFEPIme (MAXIPIME) 1 g in dextrose 5 % 50 mL IVPB  Status:  Discontinued     1 g 100 mL/hr over 30 Minutes Intravenous 3 times per day 06/13/13 1424 06/13/13 1450       Time Spent in minutes   30 minutes   Noelene Gang D.O. on 06/16/2013 at 1:03 PM  Between 7am to 7pm - Pager - [phone]  After 7pm go to [email or web address] - password [email or web address]  And look for the night coverage person covering for me after hours  [facility] Office  [phone]    Subjective:   [name] seen and examined today.  Patient continues to have shortness of breath and cough however improving.  Objective:   Filed Vitals:   06/16/13 1030 06/16/13 1100 06/16/13 1117 06/16/13 1226  BP: 143/77 148/67 126/78 153/85  Pulse: 74 75 77 92  Temp:   98.1 F (36.7 C) 97.6 F (36.4 C)  TempSrc:   Oral Axillary  Resp:   18 19  Height:      Weight:      SpO2:   98% 96%    Wt Readings from Last 3 Encounters:  06/16/13 103.1 kg (227 lb 4.7 oz)  05/31/13 103 kg (227 lb 1.2 oz)  05/31/13 103 kg (227 lb 1.2 oz)     Intake/Output Summary (Last 24 hours) at 06/16/13  1303 Last data filed at 06/16/13 1227  Gross per 24 hour  Intake   1320 ml  Output   3764 ml  Net  -2444 ml    Exam  General: Well developed, well nourished, NAD, appears stated age  HEENT: NCAT, PERRLA, EOMI, Anicteic Sclera, mucous membranes moist.   Neck: Supple, no JVD, no masses  Cardiovascular: S1 S2 auscultated,  Regular rate and rhythm.  Respiratory: Crackle noted in RLL.  Decreased breath sounds throughout  Abdomen: Soft, obese, nontender, nondistended, + bowel sounds  Extremities: RLE: warm dry without cyanosis clubbing, trace edema, 5th toe amputation.  L BKA, currently wrapped.  Left upper extremity AV fistula with good bruit and thrill  Neuro: AAOx3, cranial nerves grossly intact.   Skin: Without rashes exudates or nodules  Psych: Normal affect and demeanor with intact judgement and insight  Data Review   Micro Results Recent Results (from the past 240 hour(s))  CULTURE, BLOOD (ROUTINE X 2)     Status: None   Collection Time    06/13/13  3:09 PM      Result Value Range Status   Specimen Description BLOOD HAND RIGHT   Final   Special Requests BOTTLES DRAWN AEROBIC AND ANAEROBIC 10CC   Final   Culture  Setup Time     Final   Value: 06/13/2013 23:34     Performed at [facility]   Culture     Final   Value:        BLOOD CULTURE RECEIVED NO GROWTH TO DATE CULTURE WILL BE HELD FOR 5 DAYS BEFORE ISSUING A FINAL NEGATIVE REPORT     Performed at [facility]   Report Status PENDING   Incomplete  CULTURE,  BLOOD (ROUTINE X 2)     Status: None   Collection Time    06/13/13  3:16 PM      Result Value Range Status   Specimen Description BLOOD HAND RIGHT   Final   Special Requests BOTTLES DRAWN AEROBIC AND ANAEROBIC 10CC   Final   Culture  Setup Time     Final   Value: 06/13/2013 23:35     Performed at [facility]   Culture     Final   Value:        BLOOD CULTURE RECEIVED NO GROWTH TO DATE CULTURE WILL BE HELD FOR 5 DAYS BEFORE ISSUING  A FINAL NEGATIVE REPORT     Performed at [facility]   Report Status PENDING   Incomplete    Radiology Reports Dg Chest 2 View  06/13/2013   CLINICAL DATA:  Cough, shortness of Breath  EXAM: CHEST  2 VIEW  COMPARISON:  12/29/2012  FINDINGS: Cardiomediastinal silhouette is stable. Central mild bronchitic changes with worsening from prior exam. No pulmonary edema. Mild increased interstitial markings in right upper lobe. Atypical pneumonitis cannot be excluded.  IMPRESSION: Central mild bronchitic changes with worsening from prior exam. No pulmonary edema. Mild increased interstitial markings in right upper lobe. Atypical pneumonitis cannot be excluded.   Electronically Signed   By: Lahoma Crocker M.D.   On: 06/13/2013 13:03    CBC  Recent Labs Lab 06/13/13 1219 06/13/13 1243 06/14/13 0516 06/15/13 0550 06/16/13 0421  WBC 9.1  --  5.5 11.3* 11.7*  HGB 10.2* 12.9* 9.2* 9.5* 9.5*  HCT 35.0* 38.0* 31.8* 31.8* 32.6*  PLT 384  --  356 353 370  MCV 87.7  --  86.4 86.2 86.0  MCH 25.6*  --  25.0* 25.7* 25.1*  MCHC 29.1*  --  28.9* 29.9* 29.1*  RDW 18.7*  --  18.5* 18.6* 18.3*  LYMPHSABS 1.9  --   --   --   --   MONOABS 0.5  --   --   --   --   EOSABS 0.0  --   --   --   --   BASOSABS 0.1  --   --   --   --     Chemistries   Recent Labs Lab 06/13/13 1243 06/13/13 1525 06/14/13 0516 06/15/13 0550 06/16/13 0421  NA 138  --  139 139 135*  K 4.0  --  4.5 4.1 4.6  CL 99  --  95* 93* 92*  CO2  --   --  26 24 23   GLUCOSE 210*  --  399* 241* 160*  BUN 43*  --  32* 49* 62*  CREATININE 7.20* 7.84* 5.24* 6.46* 7.10*  CALCIUM  --   --  9.0 9.2 8.8  AST  --   --  16  --   --   ALT  --   --  9  --   --   ALKPHOS  --   --  76  --   --   BILITOT  --   --  0.4  --   --    ------------------------------------------------------------------------------------------------------------------ estimated creatinine clearance is 13.6 ml/min (by C-G formula based on Cr of  7.1). ------------------------------------------------------------------------------------------------------------------ No results found for this basename: HGBA1C,  in the last 72 hours ------------------------------------------------------------------------------------------------------------------ No results found for this basename: CHOL, HDL, LDLCALC, TRIG, CHOLHDL, LDLDIRECT,  in the last 72 hours ------------------------------------------------------------------------------------------------------------------ No results found for this basename: TSH, T4TOTAL, FREET3, T3FREE, THYROIDAB,  in the last 72 hours ------------------------------------------------------------------------------------------------------------------ No results found for this basename: VITAMINB12, FOLATE, FERRITIN, TIBC, IRON, RETICCTPCT,  in the last 72 hours  Coagulation profile  Recent Labs Lab 06/13/13 1720 06/14/13 0516 06/15/13 0550 06/16/13 0421  INR 1.69* 2.06* 2.77* 2.64*    No results found for this basename: DDIMER,  in the last 72 hours  Cardiac Enzymes No results found for this basename: CK, CKMB, TROPONINI, MYOGLOBIN,  in the last 168 hours ------------------------------------------------------------------------------------------------------------------ No components found with this basename: POCBNP,

## 2013-06-16 NOTE — Progress Notes (Signed)
[facility] Progress Note  Subjective:   Breathing ok - still wheezing though  Objective Filed Vitals:   06/16/13 0705 06/16/13 0711 06/16/13 0730 06/16/13 0800  BP: 173/75 124/76 152/89 128/82  Pulse: 84 81 95 92  Temp: 97.8 F (36.6 C)     TempSrc: Oral     Resp: 18     Height:      Weight: 103.1 kg (227 lb 4.7 oz)     SpO2: 99%      Physical Exam goal 4 L General: supine on HD NAD but breathing still somewhat labored inspite on room air O2 sats of 99% Heart: RRR though somewhat difficult exam due to wheezing Lungs: diffuse bilateral exp wheezing Abdomen: obese soft NT Extremities: no LE edema; left BKA wrapped Dialysis Access: left AVF Qb 400  Dialysis: [facility]  4:15h 104kg 2K/1.25Ca Heparin none LUA AVF BFR 400  Hectorol 2 Epogen 20K Venofer 50mg /wk  Labs: Hgb 8.6, < 9.8 <8.4 tfs 12% venofer loaded and weekly 50mg  now wkly   Assessment/Plan: 1. PNA- nebs, Vanc and Maxepime O2 per primary 2. ESRD- TTS - on HD 3. Hypertension/volume -  wt 98.1 post hd wed =6kg below prior dry wt, will cont to challenge as BP allows - get standing weights post HD; PRE HD weight today 103.1 4. Anemia - HGB= 12.9> 9.2>9.5 Fu hgb/ Araneso at 100  Low dose IV Fe q HD; isolated H/H higher than on CBCs - given low tsat, would be prudent to check for hemocult 5. Metabolic bone disease - hectorol and binder with meal- added P to labs today 6. HO A. Fib- NSR - on coumadin per pharmacy -therapeutic 7. HO PVD-sp bka revision 05/30/13 sec to fall at home 8. Copd/ tobacco abuse 9. DM - per primary  Myriam Jacobson, PA-C [place] [phone] 06/16/2013,8:47 AM  LOS: 3 days   I have seen and examined patient, discussed with PA and agree with assessment and plan as outlined above. Kelly Splinter MD pager [phone]    cell [phone] 06/16/2013, 9:58 AM    Additional Objective Labs: Basic Metabolic Panel:  Recent Labs Lab 06/14/13 0516  06/15/13 0550 06/16/13 0421  NA 139 139 135*  K 4.5 4.1 4.6  CL 95* 93* 92*  CO2 26 24 23   GLUCOSE 399* 241* 160*  BUN 32* 49* 62*  CREATININE 5.24* 6.46* 7.10*  CALCIUM 9.0 9.2 8.8  . Lab Results  Component Value Date   INR 2.64* 06/16/2013   INR 2.77* 06/15/2013   INR 2.06* 06/14/2013    Liver Function Tests:  Recent Labs Lab 06/14/13 0516  AST 16  ALT 9  ALKPHOS 76  BILITOT 0.4  PROT 6.9  ALBUMIN 3.3*   CBC:  Recent Labs Lab 06/13/13 1219  06/14/13 0516 06/15/13 0550 06/16/13 0421  WBC 9.1  --  5.5 11.3* 11.7*  NEUTROABS 6.6  --   --   --   --   HGB 10.2*  < > 9.2* 9.5* 9.5*  HCT 35.0*  < > 31.8* 31.8* 32.6*  MCV 87.7  --  86.4 86.2 86.0  PLT 384  --  356 353 370  < > = values in this interval not displayed. Medications:   . allopurinol  100 mg Oral QHS  . calcium acetate  667 mg Oral TID WC  . ceFEPime (MAXIPIME) IV  2 g Intravenous Once  . [START ON 06/19/2013] ceFEPime (MAXIPIME) IV  2 g Intravenous  Q T,Th,Sa-HD  . darbepoetin (ARANESP) injection - DIALYSIS  100 mcg Intravenous Q Sat-HD  . doxercalciferol  2 mcg Intravenous Q T,Th,Sa-HD  . ferric gluconate (FERRLECIT/NULECIT) IV  62.5 mg Intravenous Q T,Th,Sa-HD  . gabapentin  300 mg Oral QHS  . insulin aspart  0-15 Units Subcutaneous TID WC  . insulin aspart  0-5 Units Subcutaneous QHS  . insulin aspart  20 Units Subcutaneous TID WC  . insulin glargine  25 Units Subcutaneous QHS  . ipratropium-albuterol  3 mL Nebulization QID  . lanthanum  2,000 mg Oral TID WC  . multivitamin  1 tablet Oral QHS  . pantoprazole  40 mg Oral Daily  . temazepam  30 mg Oral QHS  . vancomycin  2,000 mg Intravenous Once  . vancomycin  1,000 mg Intravenous Once  . [START ON 06/19/2013] vancomycin  1,000 mg Intravenous Q T,Th,Sa-HD  . Warfarin - Pharmacist Dosing Inpatient   Does not apply [phone]

## 2013-06-17 DIAGNOSIS — J441 Chronic obstructive pulmonary disease with (acute) exacerbation: Secondary | ICD-10-CM

## 2013-06-17 LAB — GLUCOSE, CAPILLARY
GLUCOSE-CAPILLARY: 336 mg/dL — AB (ref 70–99)
GLUCOSE-CAPILLARY: 369 mg/dL — AB (ref 70–99)

## 2013-06-17 LAB — BASIC METABOLIC PANEL
BUN: 45 mg/dL — AB (ref 6–23)
CHLORIDE: 93 meq/L — AB (ref 96–112)
CO2: 22 meq/L (ref 19–32)
Calcium: 9 mg/dL (ref 8.4–10.5)
Creatinine, Ser: 5.03 mg/dL — ABNORMAL HIGH (ref 0.50–1.35)
GFR calc Af Amer: 13 mL/min — ABNORMAL LOW (ref >90)
GFR calc non Af Amer: 11 mL/min — ABNORMAL LOW (ref >90)
Glucose, Bld: 330 mg/dL — ABNORMAL HIGH (ref 70–99)
POTASSIUM: 4.8 meq/L (ref 3.7–5.3)
Sodium: 133 mEq/L — ABNORMAL LOW (ref 137–147)

## 2013-06-17 LAB — CBC
HCT: 34.2 % — ABNORMAL LOW (ref 39.0–52.0)
Hemoglobin: 10.1 g/dL — ABNORMAL LOW (ref 13.0–17.0)
MCH: 25.2 pg — ABNORMAL LOW (ref 26.0–34.0)
MCHC: 29.5 g/dL — ABNORMAL LOW (ref 30.0–36.0)
MCV: 85.3 fL (ref 78.0–100.0)
Platelets: 379 10*3/uL (ref 150–400)
RBC: 4.01 MIL/uL — ABNORMAL LOW (ref 4.22–5.81)
RDW: 17.8 % — ABNORMAL HIGH (ref 11.5–15.5)
WBC: 8.1 10*3/uL (ref 4.0–10.5)

## 2013-06-17 LAB — PROTIME-INR
INR: 2.22 — AB (ref 0.00–1.49)
Prothrombin Time: 23.9 seconds — ABNORMAL HIGH (ref 11.6–15.2)

## 2013-06-17 MED ORDER — ALBUTEROL SULFATE HFA 108 (90 BASE) MCG/ACT IN AERS: 2.0000 | INHALATION_SPRAY | RESPIRATORY_TRACT | Status: DC | PRN

## 2013-06-17 MED ORDER — CALCIUM ACETATE 667 MG PO CAPS
1334.0000 mg | ORAL_CAPSULE | Freq: Three times a day (TID) | ORAL | Status: DC
  Administered 2013-06-17: 1334 mg via ORAL
  Filled 2013-06-17 (×3): qty 2

## 2013-06-17 MED ORDER — PREDNISONE 20 MG PO TABS
40.0000 mg | ORAL_TABLET | Freq: Every day | ORAL | Status: DC
  Filled 2013-06-17: qty 2

## 2013-06-17 MED ORDER — ALBUTEROL SULFATE HFA 108 (90 BASE) MCG/ACT IN AERS
2.0000 | INHALATION_SPRAY | RESPIRATORY_TRACT | Status: DC | PRN
  Filled 2013-06-17 (×2): qty 6.7

## 2013-06-17 MED ORDER — ALBUTEROL SULFATE HFA 108 (90 BASE) MCG/ACT IN AERS: 2.0000 | INHALATION_SPRAY | RESPIRATORY_TRACT | Status: AC | PRN

## 2013-06-17 MED ORDER — WARFARIN SODIUM 5 MG PO TABS
5.0000 mg | ORAL_TABLET | Freq: Once | ORAL | Status: DC
  Filled 2013-06-17: qty 1

## 2013-06-17 MED ORDER — PREDNISONE 10 MG PO TABS: ORAL_TABLET | ORAL | Status: DC

## 2013-06-17 MED ORDER — TIOTROPIUM BROMIDE MONOHYDRATE 18 MCG IN CAPS: 18.0000 ug | ORAL_CAPSULE | Freq: Every day | RESPIRATORY_TRACT | Status: AC

## 2013-06-17 MED ORDER — LEVOFLOXACIN 750 MG PO TABS: 750.0000 mg | ORAL_TABLET | ORAL | Status: DC

## 2013-06-17 NOTE — Progress Notes (Signed)
ANTICOAGULATION CONSULT NOTE - Follow Up Consult  Pharmacy Consult for Coumadin Indication: atrial fibrillation  No Known Allergies  Patient Measurements: Height: 6\' 1"  (185.4 cm) Weight: 227 lb 4.7 oz (103.1 kg) IBW/kg (Calculated) : 79.9  Vital Signs: Temp: 98 F (36.7 C) (01/04 0756) Temp src: Oral (01/04 0756) BP: 177/96 mmHg (01/04 0756) Pulse Rate: 96 (01/04 0756)  Labs:  Recent Labs  06/15/13 0550 06/16/13 0421 06/17/13 0515  HGB 9.5* 9.5* 10.1*  HCT 31.8* 32.6* 34.2*  PLT 353 370 379  LABPROT 28.3* 27.3* 23.9*  INR 2.77* 2.64* 2.22*  CREATININE 6.46* 7.10* 5.03*    Estimated Creatinine Clearance: 19.2 ml/min (by C-G formula based on Cr of 5.03).   Medications:  Scheduled:  . allopurinol  100 mg Oral QHS  . calcium acetate  1,334 mg Oral TID WC  . [START ON 06/19/2013] ceFEPime (MAXIPIME) IV  2 g Intravenous Q T,Th,Sa-HD  . darbepoetin (ARANESP) injection - DIALYSIS  100 mcg Intravenous Q Sat-HD  . doxercalciferol  2 mcg Intravenous Q T,Th,Sa-HD  . ferric gluconate (FERRLECIT/NULECIT) IV  62.5 mg Intravenous Q T,Th,Sa-HD  . gabapentin  300 mg Oral QHS  . insulin aspart  0-15 Units Subcutaneous TID WC  . insulin aspart  0-5 Units Subcutaneous QHS  . insulin aspart  20 Units Subcutaneous TID WC  . insulin glargine  25 Units Subcutaneous QHS  . ipratropium-albuterol  3 mL Nebulization QID  . lanthanum  2,000 mg Oral TID WC  . multivitamin  1 tablet Oral QHS  . pantoprazole  40 mg Oral Daily  . temazepam  30 mg Oral QHS  . tiotropium  18 mcg Inhalation Daily  . vancomycin  2,000 mg Intravenous Once  . [START ON 06/19/2013] vancomycin  1,000 mg Intravenous Q T,Th,Sa-HD  . Warfarin - Pharmacist Dosing Inpatient   Does not apply q1800    Assessment: 63 y/o M presents with SOB and cough x 2d recently hospitalized this past month for dehiscence of L transtibial amputation. Admitted witih RUL pneumonia. Patient is ESRD (Holiday schedule MWSat holiday schedule  for TTS at [place]) with last HD 1/3. Symptoms improved with albuterol and SoluMedrol; on chronic coumadin for atrial fibrillation  Coumadin PTA for afib 5mg  daily INR on admit 12/31 1.69 given 7.5 mg 01/01 INR 2.06 - given 5 mg 01/02 INR 2.77 - unsure of reason for large increase - maybe effect of antibiotic -- given 2.5 mg 01/03 INR 2.64 - given 2.5 mg 01/04 INR 2.22  Goal of Therapy:  INR 2-3 Monitor platelets by anticoagulation protocol: Yes   Plan:  - Coumadin 5 mg x 1 tonight - Daily PT/INR, CBC, and monitor for s/s bleeding  Harolyn Rutherford, PharmD Clinical Pharmacist - Resident Pager: [phone] Pharmacy: [phone] 06/17/2013 11:54 AM

## 2013-06-17 NOTE — Progress Notes (Signed)
Patient discharged.  Patient educated on discharge instructions, follow-up appointments, and discharge medications.  Patient given hard copy prescriptions for new medications.  Patient educated on use of albuterol inhaler and spiriva inhaler, demonstrated understanding.  Patient educated on pneumonia, signs and symptoms, and when to call a doctor.  Patient verbalized understanding.  Belongings gathered.  IV removed.  Patient escorted out via wheelchair with nurse.

## 2013-06-17 NOTE — Progress Notes (Signed)
[facility] Progress Note  Subjective:   Feels great. Wants to go home. Eating well. Not coughing up "stuff" anymore  Objective Filed Vitals:   06/16/13 1720 06/16/13 1924 06/16/13 2100 06/17/13 0756  BP: 165/83  148/84 177/96  Pulse: 107  100 96  Temp: 97.1 F (36.2 C)  98.1 F (36.7 C) 98 F (36.7 C)  TempSrc: Oral  Oral Oral  Resp: 20  20 20   Height:      Weight:      SpO2: 94% 97% 97% 96%   Physical Exam General: looks great Heart: RRR Lungs: still with some wheezes but greatly improved from yesterday Abdomen:soft Extremities: no LE edema Dialysis Access: left upper AVF + bruit  Dialysis: [facility]  4:15h 104kg 2K/1.25Ca Heparin none LUA AVF BFR 400  Hectorol 2 Epogen 20K Venofer 50mg /wk  Labs: Hgb 8.6, < 9.8 <8.4 tfs 12% venofer loaded and weekly 50mg  now wkly   Assessment/Plan: 1. PNA- nebs, Vanc and Maxepime O2 per primary 2. ESRD- TTS - next HD Tuesday - will have a lower EDW at d/c-98-99 3. Hypertension/volume - wt 98.1 post hd wed =6kg below prior dry wt, PRE HD weight 1/03 103.1 with net UF 3.5 and pt not weighed post HD. BP elevated -  4. Anemia - HGB= 12.9> 9.2>9.5 Fu hgb/ Araneso at 100 Low dose IV Fe q HD; isolated H/H higher than on CBCs - given low tsat, Heme neg 1/03 5. Metabolic bone disease - hectorol and binder with meal- P 6.6 - will increase phoslo to 2 ac; already on 2 gm fosrenol 6. HO A. Fib- NSR - on coumadin per pharmacy -therapeutic 7. HO PVD-sp bka revision 05/30/13 sec to fall at home 8. Copd/ tobacco abuse 9. DM - per primary 10. Disp - ok from renal for d/c  Myriam Jacobson, PA-C [place] [phone] 06/17/2013,9:45 AM  LOS: 4 days   I have seen and examined patient, discussed with PA and agree with assessment and plan as outlined above. Kelly Splinter MD pager [phone]    cell [phone] 06/17/2013, 1:37 PM    Additional Objective Labs: Lab Results  Component Value Date   INR 2.22*  06/17/2013   INR 2.64* 06/16/2013   INR 2.77* 09/13/6832    Basic Metabolic Panel:  Recent Labs Lab 06/15/13 0550 06/16/13 0421 06/16/13 0714 06/17/13 0515  NA 139 135*  --  133*  K 4.1 4.6  --  4.8  CL 93* 92*  --  93*  CO2 24 23  --  22  GLUCOSE 241* 160*  --  330*  BUN 49* 62*  --  45*  CREATININE 6.46* 7.10*  --  5.03*  CALCIUM 9.2 8.8  --  9.0  PHOS  --   --  6.6*  --    Liver Function Tests:  Recent Labs Lab 06/14/13 0516  AST 16  ALT 9  ALKPHOS 76  BILITOT 0.4  PROT 6.9  ALBUMIN 3.3*   CBC:  Recent Labs Lab 06/13/13 1219  06/14/13 0516 06/15/13 0550 06/16/13 0421 06/17/13 0515  WBC 9.1  --  5.5 11.3* 11.7* 8.1  NEUTROABS 6.6  --   --   --   --   --   HGB 10.2*  < > 9.2* 9.5* 9.5* 10.1*  HCT 35.0*  < > 31.8* 31.8* 32.6* 34.2*  MCV 87.7  --  86.4 86.2 86.0 85.3  PLT 384  --  356 353 370 379  < > =  values in this interval not displayed. BCBG:  Recent Labs Lab 06/15/13 2116 06/16/13 1225 06/16/13 1719 06/16/13 2137 06/17/13 0754  GLUCAP 280* 151* 243* 376* 336*  Medications:   . allopurinol  100 mg Oral QHS  . calcium acetate  667 mg Oral TID WC  . [START ON 06/19/2013] ceFEPime (MAXIPIME) IV  2 g Intravenous Q T,Th,Sa-HD  . darbepoetin (ARANESP) injection - DIALYSIS  100 mcg Intravenous Q Sat-HD  . doxercalciferol  2 mcg Intravenous Q T,Th,Sa-HD  . ferric gluconate (FERRLECIT/NULECIT) IV  62.5 mg Intravenous Q T,Th,Sa-HD  . gabapentin  300 mg Oral QHS  . insulin aspart  0-15 Units Subcutaneous TID WC  . insulin aspart  0-5 Units Subcutaneous QHS  . insulin aspart  20 Units Subcutaneous TID WC  . insulin glargine  25 Units Subcutaneous QHS  . ipratropium-albuterol  3 mL Nebulization QID  . lanthanum  2,000 mg Oral TID WC  . multivitamin  1 tablet Oral QHS  . pantoprazole  40 mg Oral Daily  . temazepam  30 mg Oral QHS  . tiotropium  18 mcg Inhalation Daily  . vancomycin  2,000 mg Intravenous Once  . [START ON 06/19/2013] vancomycin  1,000 mg  Intravenous Q T,Th,Sa-HD  . Warfarin - Pharmacist Dosing Inpatient   Does not apply [phone]

## 2013-06-17 NOTE — Discharge Summary (Signed)
PATIENT DETAILS Name: [name] Age: 63 y.o. Sex: male Date of Birth: [birth year] MRN: [id number]. Admit Date: 06/13/2013 Admitting Physician: Donne Hazel, MD [id number], [name], MD  Recommendations for Outpatient Follow-up:  1. Please repeat CXR in 6-8 weeks 2. Monitor INR while on Levaquin 3. Needs Pulmonary evaluation for PFT's  PRIMARY DISCHARGE DIAGNOSIS:  Principal Problem:   COPD Active Problems:   DIABETES MELLITUS, TYPE II   GOUT   PERIPHERAL NEUROPATHY   Atrial fibrillation   Chronic systolic heart failure   ESRD on dialysis   Healthcare-associated pneumonia   HCAP (healthcare-associated pneumonia)      PAST MEDICAL HISTORY: Past Medical History  Diagnosis Date  . GERD (gastroesophageal reflux disease)   . Hypertension   . Neuromuscular disorder     neurophathy  . Atrial fibrillation     chronically anticoagulated with coumadin  . Hyperlipidemia   . Atrial flutter 07/07/2009    s/p ablation  . GOUT 08/01/2008  . ERECTILE DYSFUNCTION 08/01/2008  . PERIPHERAL NEUROPATHY 08/01/2008  . CORONARY ARTERY DISEASE 08/01/2008    non obstructive per cath 1/10; Dr. Olevia Perches  . 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 Gastroparesis 08/01/2008  . NEPHROLITHIASIS, HX OF 08/01/2008  . Chronic systolic heart failure [id number]  . COPD 07/21/2010  . Diverticulosis of colon (without mention of hemorrhage) 10/28/2010  . Benign neoplasm of colon 10/28/2010  . Hemorrhoids, internal 10/28/2010  . Second degree Mobitz I AV block     asymptomatic  . Cyst     "I've had 4-5; pretty much all of them have got infected" (06/13/2013)  . Muscle spasm of back     back muscle spasms and pt is unable to lay flat   . Abscess of back     lower middle portion of back  . Peripheral vascular disease     poor circulation  . Pneumonia     "first time I've been dx'd was today" (06/13/2013)  . Shortness of breath     "just related to this pneumonia" (06/13/2013)  . Type II  diabetes mellitus dx'd 1980's  . Migraines     "none since the 1980's" (06/13/2013)  . End stage renal failure on dialysis 08/01/2008    "[place]; TTS" (06/13/2013)  . Renal cyst     bilateral  . Basal cell carcinoma     nose    DISCHARGE MEDICATIONS:   Medication List    STOP taking these medications       doxycycline 100 MG tablet  Commonly known as:  VIBRA-TABS     oxyCODONE-acetaminophen 5-325 MG per tablet  Commonly known as:  ROXICET      TAKE these medications       albuterol 108 (90 BASE) MCG/ACT inhaler  Commonly known as:  PROVENTIL HFA;VENTOLIN HFA  Inhale 2 puffs into the lungs every 4 (four) hours as needed for wheezing or shortness of breath.     allopurinol 100 MG tablet  Commonly known as:  ZYLOPRIM  Take 100 mg by mouth at bedtime.     calcium acetate 667 MG capsule  Commonly known as:  PHOSLO  Take 667 mg by mouth 3 (three) times daily with meals.     DIALYVITE PO  Take 100 mg by mouth daily.     ethyl chloride spray  Apply 1 application topically daily as needed. For pain     FOSRENOL 1000 MG chewable tablet  Generic drug:  lanthanum  Chew 2 tablets by mouth 3 (three) times daily with meals.     gabapentin 300 MG capsule  Commonly known as:  NEURONTIN  Take 300 mg by mouth at bedtime.     HYDROcodone-acetaminophen 5-325 MG per tablet  Commonly known as:  NORCO/VICODIN  Take 1 tablet by mouth every 4 (four) hours as needed for pain.     insulin aspart 100 UNIT/ML injection  Commonly known as:  novoLOG  Inject 20 Units into the skin 3 (three) times daily with meals.     insulin glargine 100 UNIT/ML injection  Commonly known as:  LANTUS  Inject 25 Units into the skin at bedtime.     levofloxacin 750 MG tablet  Commonly known as:  LEVAQUIN  Take 1 tablet (750 mg total) by mouth every other day.     loperamide 2 MG capsule  Commonly known as:  IMODIUM  Take 2 mg by mouth daily as needed for diarrhea or loose stools.      omeprazole 20 MG capsule  Commonly known as:  PRILOSEC  Take 1 capsule (20 mg total) by mouth daily.     predniSONE 10 MG tablet  Commonly known as:  DELTASONE  - Take 4 tablets (40 mg) daily for 2 days, then,  - Take 3 tablets (30 mg) daily for 2 days, then,  - Take 2 tablets (20 mg) daily for 2 days, then,  - Take 1 tablets (10 mg) daily for 2 days, then stop     temazepam 30 MG capsule  Commonly known as:  RESTORIL  Take 30 mg by mouth at bedtime.     tiotropium 18 MCG inhalation capsule  Commonly known as:  SPIRIVA  Place 1 capsule (18 mcg total) into inhaler and inhale daily.     warfarin 5 MG tablet  Commonly known as:  COUMADIN  Take 5 mg by mouth at bedtime.        ALLERGIES:  No Known Allergies  BRIEF HPI:  See H&P, Labs, Consult and Test reports for all details in brief, 63 year old male history of diabetes, hypertension, end-stage renal disease, active tobacco use and atrial fibrillation presents emergency department with worsening shortness of breath and cough for approximately 2-3 days. Patient does not take any medications for COPD home  CONSULTATIONS:   nephrology  PERTINENT RADIOLOGIC STUDIES: Dg Chest 2 View  06/13/2013   CLINICAL DATA:  Cough, shortness of Breath  EXAM: CHEST  2 VIEW  COMPARISON:  12/29/2012  FINDINGS: Cardiomediastinal silhouette is stable. Central mild bronchitic changes with worsening from prior exam. No pulmonary edema. Mild increased interstitial markings in right upper lobe. Atypical pneumonitis cannot be excluded.  IMPRESSION: Central mild bronchitic changes with worsening from prior exam. No pulmonary edema. Mild increased interstitial markings in right upper lobe. Atypical pneumonitis cannot be excluded.   Electronically Signed   By: Lahoma Crocker M.D.   On: 06/13/2013 13:03     PERTINENT LAB RESULTS: CBC:  Recent Labs  06/16/13 0421 06/17/13 0515  WBC 11.7* 8.1  HGB 9.5* 10.1*  HCT 32.6* 34.2*  PLT 370 379    CMET CMP     Component Value Date/Time   NA 133* 06/17/2013 0515   K 4.8 06/17/2013 0515   CL 93* 06/17/2013 0515   CO2 22 06/17/2013 0515   GLUCOSE 330* 06/17/2013 0515   BUN 45* 06/17/2013 0515   CREATININE 5.03* 06/17/2013 0515   CALCIUM 9.0 06/17/2013 0515   CALCIUM 9.4 01/05/2010 1310  PROT 6.9 06/14/2013 0516   ALBUMIN 3.3* 06/14/2013 0516   AST 16 06/14/2013 0516   ALT 9 06/14/2013 0516   ALKPHOS 76 06/14/2013 0516   BILITOT 0.4 06/14/2013 0516   GFRNONAA 11* 06/17/2013 0515   GFRAA 13* 06/17/2013 0515    GFR Estimated Creatinine Clearance: 19.2 ml/min (by C-G formula based on Cr of 5.03). No results found for this basename: LIPASE, AMYLASE,  in the last 72 hours No results found for this basename: CKTOTAL, CKMB, CKMBINDEX, TROPONINI,  in the last 72 hours No components found with this basename: POCBNP,  No results found for this basename: DDIMER,  in the last 72 hours No results found for this basename: HGBA1C,  in the last 72 hours No results found for this basename: CHOL, HDL, LDLCALC, TRIG, CHOLHDL, LDLDIRECT,  in the last 72 hours No results found for this basename: TSH, T4TOTAL, FREET3, T3FREE, THYROIDAB,  in the last 72 hours No results found for this basename: VITAMINB12, FOLATE, FERRITIN, TIBC, IRON, RETICCTPCT,  in the last 72 hours Coags:  Recent Labs  06/16/13 0421 06/17/13 0515  INR 2.64* 2.22*   Microbiology: Recent Results (from the past 240 hour(s))  CULTURE, BLOOD (ROUTINE X 2)     Status: None   Collection Time    06/13/13  3:09 PM      Result Value Range Status   Specimen Description BLOOD HAND RIGHT   Final   Special Requests BOTTLES DRAWN AEROBIC AND ANAEROBIC 10CC   Final   Culture  Setup Time     Final   Value: 06/13/2013 23:34     Performed at [facility]   Culture     Final   Value:        BLOOD CULTURE RECEIVED NO GROWTH TO DATE CULTURE WILL BE HELD FOR 5 DAYS BEFORE ISSUING A FINAL NEGATIVE REPORT     Performed at [facility]   Report  Status PENDING   Incomplete  CULTURE, BLOOD (ROUTINE X 2)     Status: None   Collection Time    06/13/13  3:16 PM      Result Value Range Status   Specimen Description BLOOD HAND RIGHT   Final   Special Requests BOTTLES DRAWN AEROBIC AND ANAEROBIC 10CC   Final   Culture  Setup Time     Final   Value: 06/13/2013 23:35     Performed at [facility]   Culture     Final   Value:        BLOOD CULTURE RECEIVED NO GROWTH TO DATE CULTURE WILL BE HELD FOR 5 DAYS BEFORE ISSUING A FINAL NEGATIVE REPORT     Performed at [facility]   Report Status PENDING   Incomplete     BRIEF HOSPITAL COURSE:   Principal Problem:   COPD with acute exacerbation -admitted and started on IV Solumedrol, IV Antibiotics and scheduled nebulized bronchodilators. -Made significant clinical improvement by day of discharge. On day of discharge, no longer requiring O2, lungs clear on exam.  -Stable for discharge on Spiriva, Albuterol inhaler and tapering steroids. -Not interested in quittting tobacco use! -Needs Pulmonary evaluation in the outpatient setting for PFT's  HCAP -admitted and started on IV Vanco/Cefepime. -Blood cultures neg -Urine strep pneumonia antigen negative -since afebrile, clinically improved, will transition to Levaquin on discharge-for 3 more doses.  End-stage disease requiring hemodialysis  -Nephrology consulted and following  -Patient normally dialyzes on Tuesday Thursday Saturday  Atrial fibrillation  -  Currently rate controlled -INR therapeutic on discharge -monitor INR closely-patient is on Levaquin for 3 doses.  Anemia likely secondary to chronic disease  -Hemoglobin stable  -monitor periodically as outpatient  Tobacco abuse  -Smoking cessation counseling done-not interested in quitting.   Diabetes mellitus with neuropathy  -Continue Lantus and Novolog on discharge -Continue gabapentin for neuropathy   Gout  -Continue allopurinol   GERD  -Continue Protonix    TODAY-DAY OF DISCHARGE:  Subjective:   Benno Brensinger today has no headache,no chest abdominal pain,no new weakness tingling or numbness, feels much better wants to go home today.   Objective:   Blood pressure 177/96, pulse 96, temperature 98 F (36.7 C), temperature source Oral, resp. rate 20, height 6\' 1"  (1.854 m), weight 103.1 kg (227 lb 4.7 oz), SpO2 96.00%.  Intake/Output Summary (Last 24 hours) at 06/17/13 1245 Last data filed at 06/17/13 0900  Gross per 24 hour  Intake    960 ml  Output    476 ml  Net    484 ml   Filed Weights   06/13/13 1720 06/13/13 2123 06/16/13 0705  Weight: 101 kg (222 lb 10.6 oz) 98.1 kg (216 lb 4.3 oz) 103.1 kg (227 lb 4.7 oz)    Exam Awake Alert, Oriented *3, No new F.N deficits, Normal affect .AT,PERRAL Supple Neck,No JVD, No cervical lymphadenopathy appriciated.  Symmetrical Chest wall movement, Good air movement bilaterally, CTAB RRR,No Gallops,Rubs or new Murmurs, No Parasternal Heave +ve B.Sounds, Abd Soft, Non tender, No organomegaly appriciated, No rebound -guarding or rigidity. No Cyanosis, Clubbing or edema, No new Rash or bruise  DISCHARGE CONDITION: Stable  DISPOSITION: Home  DISCHARGE INSTRUCTIONS:    Activity:  As tolerated with Full fall precautions use walker/cane & assistance as needed  Diet recommendation: Diabetic Diet Heart Healthy diet      Discharge Orders   Future Orders Complete By Expires   Call MD for:  difficulty breathing, headache or visual disturbances  As directed    Diet - low sodium heart healthy  As directed    Diet Carb Modified  As directed    Increase activity slowly  As directed       Follow-up Information   Follow up with Renato Shin, MD. Schedule an appointment as soon as possible for a visit in 1 week.   Specialty:  Endocrinology   Contact information:   [address] [place] [address] [phone]       Please follow up.   Contact information:    [facility]-at your prior usual schedule        Total Time spent on discharge equals 45 minutes.  SignedOren Binet 06/17/2013 12:45 PM

## 2013-06-19 LAB — PROTIME-INR: INR: 2.4 — AB (ref 0.9–1.1)

## 2013-06-19 LAB — CULTURE, BLOOD (ROUTINE X 2)
Culture: NO GROWTH
Culture: NO GROWTH

## 2013-06-21 ENCOUNTER — Ambulatory Visit (INDEPENDENT_AMBULATORY_CARE_PROVIDER_SITE_OTHER): Payer: 59 | Admitting: Cardiology

## 2013-06-21 DIAGNOSIS — I4891 Unspecified atrial fibrillation: Secondary | ICD-10-CM

## 2013-06-21 DIAGNOSIS — Z7901 Long term (current) use of anticoagulants: Secondary | ICD-10-CM

## 2013-06-25 ENCOUNTER — Ambulatory Visit: Payer: 59 | Admitting: Endocrinology

## 2013-06-27 ENCOUNTER — Ambulatory Visit (INDEPENDENT_AMBULATORY_CARE_PROVIDER_SITE_OTHER): Payer: Medicare Other | Admitting: Endocrinology

## 2013-06-27 ENCOUNTER — Encounter: Payer: Self-pay | Admitting: Endocrinology

## 2013-06-27 VITALS — BP 118/82 | HR 72 | Temp 98.4°F | Ht 73.0 in

## 2013-06-27 DIAGNOSIS — J438 Other emphysema: Secondary | ICD-10-CM

## 2013-06-27 DIAGNOSIS — E1059 Type 1 diabetes mellitus with other circulatory complications: Secondary | ICD-10-CM

## 2013-06-27 DIAGNOSIS — J189 Pneumonia, unspecified organism: Secondary | ICD-10-CM

## 2013-06-27 LAB — HEMOGLOBIN A1C: HEMOGLOBIN A1C: 7.9 % — AB (ref 4.6–6.5)

## 2013-06-27 MED ORDER — FLUCONAZOLE 150 MG PO TABS: 150.0000 mg | ORAL_TABLET | Freq: Once | ORAL | Status: DC

## 2013-06-27 NOTE — Patient Instructions (Addendum)
blood tests, and a chest-x-ray, are being requested for you today.  We'll contact you with results. Refer to a lung specialist.  you will receive a phone call, about a day and time for an appointment. Please come back for a follow-up appointment in 1 month. i have sent a prescription to your pharmacy, for your mouth (possible thrush).

## 2013-06-27 NOTE — Progress Notes (Signed)
Subjective:    Patient ID: [name], male    DOB: 1950/06/28, 63 y.o.   MRN: [id number]  HPI Pt returns for f/u of insulin-requiring DM (dx'ed 1983, when he presented with polyuria; he has moderate painful neuropathy of the lower extremities; he has associated foot ulcer, retinopathy, gastroparesis, peripheral sensory neuropathy, CAD, and renal failure; he has never had severe hypoglycemia or DKA).  He lives back home now.  He estimates a few more weeks there.  no cbg record, but states cbg's are approx 200 (says this is exac by prednisone).   Sob has resolved.   Pt says his mouth is burning since on the prednisone.  Past Medical History  Diagnosis Date  . GERD (gastroesophageal reflux disease)   . Hypertension   . Neuromuscular disorder     neurophathy  . Atrial fibrillation     chronically anticoagulated with coumadin  . Hyperlipidemia   . Atrial flutter 07/07/2009    s/p ablation  . GOUT 08/01/2008  . ERECTILE DYSFUNCTION 08/01/2008  . PERIPHERAL NEUROPATHY 08/01/2008  . CORONARY ARTERY DISEASE 08/01/2008    non obstructive per cath 1/10; Dr. Olevia Perches  . 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 Gastroparesis 08/01/2008  . NEPHROLITHIASIS, HX OF 08/01/2008  . Chronic systolic heart failure 07/22/3149  . COPD 07/21/2010  . Diverticulosis of colon (without mention of hemorrhage) 10/28/2010  . Benign neoplasm of colon 10/28/2010  . Hemorrhoids, internal 10/28/2010  . Second degree Mobitz I AV block     asymptomatic  . Cyst     "I've had 4-5; pretty much all of them have got infected" (06/13/2013)  . Muscle spasm of back     back muscle spasms and pt is unable to lay flat   . Abscess of back     lower middle portion of back  . Peripheral vascular disease     poor circulation  . Pneumonia     "first time I've been dx'd was today" (06/13/2013)  . Shortness of breath     "just related to this pneumonia" (06/13/2013)  . Type II diabetes mellitus dx'd 1980's  .  Migraines     "none since the 1980's" (06/13/2013)  . End stage renal failure on dialysis 08/01/2008    "[place]; TTS" (06/13/2013)  . Renal cyst     bilateral  . Basal cell carcinoma     nose    Past Surgical History  Procedure Laterality Date  . Atrial ablation surgery  07-2008, 07/08/09    Dr Rayann Heman  . Foot surgery Right 09-2009    infection got into the bone; had to put rod in to hold foot together" (06/13/2013)  . Colonoscopy  2011?    Dr.Orr  . Av fistula placement Left 2010    upper arm  . Elbow fracture surgery Right 1974  . Pilonidal cyst excision  1971  . Cataract extraction Right ~ 2012  . I&d extremity Left 12/29/2012    Procedure: IRRIGATION AND DEBRIDEMENT EXTREMITY;  Surgeon: Newt Minion, MD;  Location: [facility];  Service: Orthopedics;  Laterality: Left;  Irrigation and Debridement Left Leg, VAC, [place]  . Toe amputation Right 01/18/2013    "little toe" Dr Sharol Given  . I&d extremity Bilateral 01/18/2013    Procedure: IRRIGATION AND DEBRIDEMENT EXTREMITYLEFT LEG AND RIGHT FOOT;  Surgeon: Newt Minion, MD;  Location: [facility];  Service: Orthopedics;  Laterality: Bilateral;  Left Leg Excisional Debridement  .  Skin graft Left     left leg within the past month  . Amputation Left 02/02/2013    Procedure: AMPUTATION BELOW KNEE;  Surgeon: Newt Minion, MD;  Location: [facility];  Service: Orthopedics;  Laterality: Left;  Left Below Knee Amputation  . Fracture surgery    . Vasectomy    . Amputation Left 03/23/2013    Procedure: REVISION OF BELOW THE KNEE  AMPUTATION ;  Surgeon: Newt Minion, MD;  Location: [facility];  Service: Orthopedics;  Laterality: Left;  Left Below Knee Amputation Revision  . I&d extremity Left 05/04/2013    Procedure: Irrigation and Debridement Left Below Knee Ampuation;  Surgeon: Newt Minion, MD;  Location: [facility];  Service: Orthopedics;  Laterality: Left;  Irrigation and Debridement Left Below Knee Ampuation  . Amputation Left 05/30/2013    Procedure: REVISION  AMPUTATION BELOW KNEE;  Surgeon: Newt Minion, MD;  Location: [facility];  Service: Orthopedics;  Laterality: Left;  Revision Left Below Knee Amputation, Possible Above Knee Amputation  . Tonsillectomy      History   Social History  . Marital Status: Married    Spouse Name: N/A    Number of Children: 3  . Years of Education: N/A   Occupational History  . Sales Rep    Social History Main Topics  . Smoking status: Current Every Day Smoker -- 0.50 packs/day for 44 years    Types: Cigarettes  . Smokeless tobacco: Never Used  . Alcohol Use: Yes     Comment: 06/13/2013 "no alcohol in 40 yrs"  . Drug Use: Yes    Special: Marijuana     Comment: 06/13/2013 "used marijuana years ago"  . Sexual Activity: Not Currently   Other Topics Concern  . 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[employer]          Current Outpatient Prescriptions on File Prior to Visit  Medication Sig Dispense Refill  . albuterol (PROVENTIL HFA;VENTOLIN HFA) 108 (90 BASE) MCG/ACT inhaler Inhale 2 puffs into the lungs every 4 (four) hours as needed for wheezing or shortness of breath.  1 Inhaler  2  . allopurinol (ZYLOPRIM) 100 MG tablet Take 100 mg by mouth at bedtime.       . B Complex-C-Folic Acid (DIALYVITE PO) Take 100 mg by mouth daily.        . calcium acetate (PHOSLO) 667 MG capsule Take 667 mg by mouth 3 (three) times daily with meals.      Marland Kitchen ethyl chloride spray Apply 1 application topically daily as needed. For pain      . FOSRENOL 1000 MG chewable tablet Chew 2 tablets by mouth 3 (three) times daily with meals.       . gabapentin (NEURONTIN) 300 MG capsule Take 300 mg by mouth at bedtime.       Marland Kitchen HYDROcodone-acetaminophen (NORCO/VICODIN) 5-325 MG per tablet Take 1 tablet by mouth every 4 (four) hours as needed for pain.      Marland Kitchen insulin glargine (LANTUS) 100 UNIT/ML injection Inject 25 Units into the skin at bedtime.       Marland Kitchen loperamide (IMODIUM) 2 MG capsule Take 2 mg by mouth daily  as needed for diarrhea or loose stools.       Marland Kitchen omeprazole (PRILOSEC) 20 MG capsule Take 1 capsule (20 mg total) by mouth daily.  90 capsule  1  . temazepam (RESTORIL) 30 MG capsule Take 30 mg by mouth at  bedtime.      Marland Kitchen tiotropium (SPIRIVA) 18 MCG inhalation capsule Place 1 capsule (18 mcg total) into inhaler and inhale daily.  30 capsule  12  . warfarin (COUMADIN) 5 MG tablet Take 5 mg by mouth at bedtime.       . insulin aspart (NOVOLOG) 100 UNIT/ML injection Inject 20 Units into the skin 3 (three) times daily with meals.        No current facility-administered medications on file prior to visit.    No Known Allergies  Family History  Problem Relation Age of Onset  . Diabetes Mother   . Diabetes Maternal Grandmother   . Diabetes Paternal Grandmother   . Cancer Neg Hx     no FH of Colon Cancer    BP 118/82  Pulse 72  Temp(Src) 98.4 F (36.9 C) (Oral)  Ht 6\' 1"  (1.854 m)  SpO2 %  Review of Systems Denies fever and hypoglycemia (except in the hospital).      Objective:   Physical Exam VITAL SIGNS:  See vs page GENERAL: no distress Mouth: mild erythema LUNGS:  Clear to auscultation   Lab Results  Component Value Date   HGBA1C 7.9* 06/27/2013      Assessment & Plan:  DM: This insulin regimen was chosen from multiple options, as it best matches his insulin to his changing requirements throughout the day.  The benefits of glycemic control must be weighed against the risks of hypoglycemia.  Considering he has been on prednisone, control is pretty good.   Renal failure: in this setting, he may not need basal insulin. BKA: this limits exercise rx of DM. Oral sxs: possibly due to thrush Pneumonia: clinically better.

## 2013-07-03 ENCOUNTER — Institutional Professional Consult (permissible substitution): Payer: 59 | Admitting: Internal Medicine

## 2013-07-04 ENCOUNTER — Other Ambulatory Visit: Payer: Self-pay | Admitting: Internal Medicine

## 2013-07-04 ENCOUNTER — Ambulatory Visit (INDEPENDENT_AMBULATORY_CARE_PROVIDER_SITE_OTHER)
Admission: RE | Admit: 2013-07-04 | Discharge: 2013-07-04 | Disposition: A | Discharge status: Home / Self Care | Payer: Medicare Other | Source: Ambulatory Visit | Attending: Internal Medicine | Admitting: Internal Medicine

## 2013-07-04 ENCOUNTER — Encounter: Payer: Self-pay | Admitting: Internal Medicine

## 2013-07-04 ENCOUNTER — Ambulatory Visit (INDEPENDENT_AMBULATORY_CARE_PROVIDER_SITE_OTHER): Payer: Medicare Other | Admitting: Internal Medicine

## 2013-07-04 VITALS — BP 128/80 | HR 107 | Temp 97.6°F

## 2013-07-04 DIAGNOSIS — IMO0001 Reserved for inherently not codable concepts without codable children: Secondary | ICD-10-CM | POA: Insufficient documentation

## 2013-07-04 DIAGNOSIS — J449 Chronic obstructive pulmonary disease, unspecified: Secondary | ICD-10-CM

## 2013-07-04 DIAGNOSIS — F17200 Nicotine dependence, unspecified, uncomplicated: Secondary | ICD-10-CM

## 2013-07-04 NOTE — Assessment & Plan Note (Signed)
>   3m   I took an extended  opportunity with this patient to outline the consequences of continued cigarette use  in airway disorders based on all the data we have from the multiple national lung health studies (perfomed over decades at millions of dollars in cost)  indicating that smoking cessation, not choice of inhalers or physicians, is the most important aspect of his care.   

## 2013-07-04 NOTE — Progress Notes (Signed)
Quick Note:  Spoke with pt and notified of results per Dr. Wert. Pt verbalized understanding and denied any questions.  ______ 

## 2013-07-04 NOTE — Assessment & Plan Note (Signed)
DDX of  difficult airways managment all start with A and  include Adherence, Ace Inhibitors, Acid Reflux, Active Sinus Disease, Alpha 1 Antitripsin deficiency, Anxiety masquerading as Airways dz,  ABPA,  allergy(esp in young), Aspiration (esp in elderly), Adverse effects of DPI,  Active smokers, plus two Bs  = Bronchiectasis and Beta blocker use..and one C= CHF   Adherence is always the initial "prime suspect" and is a multilayered concern that requires a "trust but verify" approach in every patient - starting with knowing how to use medications, especially inhalers, correctly, keeping up with refills and understanding the fundamental difference between maintenance and prns vs those medications only taken for a very short course and then stopped and not refilled.  - The proper method of use, as well as anticipated side effects, of a metered-dose inhaler are discussed and demonstrated to the patient. Improved effectiveness after extensive coaching during this visit to a level of approximately  75%  Active smoking > discussed   Needs f/u with pfts

## 2013-07-04 NOTE — Patient Instructions (Signed)
The key is to stop smoking completely before smoking completely stops you!  spiriva one capsule each am  Only use your albuterol (ventolin)as a rescue medication to be used if you can't catch your breath by resting or doing a relaxed purse lip breathing pattern.  - The less you use it, the better it will work when you need it. - Ok to use up to 2 puffs  every 4 hours if you must but call for immediate appointment if use goes up over your usual need - Don't leave home without it !!  (think of it like the spare tire for your car)   Please remember to go to the x-ray department downstairs for your tests - we will call you with the results when they are available.  Please schedule a follow up office visit in 6 weeks, call sooner if needed with pfts

## 2013-07-04 NOTE — Progress Notes (Signed)
   Subjective:    Patient ID: [name], male    DOB: [birth year], 63 y.o.   MRN: [id number]  HPI  79 yowm smoker referred 07/04/2013 to pulmonary clinic by Dr [name] for copd eval.  Date of Birth: [name]  MRN: [id number].  Admit Date: 06/13/2013  Admitting Physician: Perry Hazel, MD  FFM:[id number], Perry Clark, MD  Recommendations for Outpatient Follow-up:  1. Please repeat CXR in 6-8 weeks 2. Monitor INR while on Levaquin 3. Needs Pulmonary evaluation for PFT's PRIMARY DISCHARGE DIAGNOSIS:  Principal Problem:  COPD  Active Problems:  DIABETES MELLITUS, TYPE II  GOUT  PERIPHERAL NEUROPATHY  Atrial fibrillation  Chronic systolic heart failure  ESRD on dialysis  Healthcare-associated pneumonia  HCAP (healthcare-associated pneumonia)   07/04/2013 1st [facility] office visit/ [name] on spiriva maint cc back to baseline = no sob at rest or with the minimal activities he does restricted by L leg amputation for pvd.  Taking daytime saba though not clear he feels anything from it   No obvious day to day or daytime variabilty or assoc chronic cough or cp or chest tightness, subjective wheeze overt sinus or hb symptoms. No unusual exp hx or h/o childhood pna/ asthma or knowledge of premature birth.  Sleeping ok without nocturnal  or early am exacerbation  of respiratory  c/o's or need for noct saba. Also denies any obvious fluctuation of symptoms with weather or environmental changes or other aggravating or alleviating factors except as outlined above   Current Medications, Allergies, Complete Past Medical History, Past Surgical History, Family History, and Social History were reviewed in [employer] record.            Review of Systems  Constitutional: Negative for fever, chills, activity change, appetite change and unexpected weight change.  HENT: Negative for congestion, dental problem, postnasal drip, rhinorrhea, sneezing, sore throat, trouble  swallowing and voice change.   Eyes: Negative for visual disturbance.  Respiratory: Positive for cough and shortness of breath. Negative for choking.   Cardiovascular: Negative for chest pain and leg swelling.  Gastrointestinal: Negative for nausea, vomiting and abdominal pain.  Genitourinary: Negative for difficulty urinating.  Musculoskeletal: Negative for arthralgias.  Skin: Negative for rash.  Psychiatric/Behavioral: Negative for behavioral problems and confusion.       Objective:   Physical Exam  W/c bound pleasant wm in motorized w/c  HEENT mild turbinate edema.  Oropharynx no thrush or excess pnd or cobblestoning.  No JVD or cervical adenopathy. Mild accessory muscle hypertrophy. Trachea midline, nl thryroid. Chest was hyperinflated by percussion with diminished breath sounds and moderate increased exp time without wheeze. Hoover sign positive at mid inspiration. Regular rate and rhythm without murmur gallop or rub or increase P2 or edema.  Abd: no hsm, nl excursion. Ext warm without cyanosis or clubbing.      CXR  07/04/2013 :  There is no evidence of pneumonia. There is chronic enlargement of the cardiac silhouette and chronic mildly increased interstitial markings. This could reflect low-grade CHF in the appropriate clinical setting. The patient has history of tobacco use may also be contributing to the mild prominence of the interstitial markings diffusely.       Assessment & Plan:

## 2013-07-12 LAB — PROTIME-INR: INR: 2.7 — AB (ref 0.9–1.1)

## 2013-07-16 ENCOUNTER — Ambulatory Visit (INDEPENDENT_AMBULATORY_CARE_PROVIDER_SITE_OTHER): Payer: 59 | Admitting: Pharmacist

## 2013-07-16 DIAGNOSIS — I4891 Unspecified atrial fibrillation: Secondary | ICD-10-CM

## 2013-07-16 DIAGNOSIS — Z7901 Long term (current) use of anticoagulants: Secondary | ICD-10-CM

## 2013-07-27 ENCOUNTER — Ambulatory Visit: Payer: 59 | Admitting: Endocrinology

## 2013-08-11 LAB — PROTIME-INR: INR: 2.3 — AB (ref 0.9–1.1)

## 2013-08-14 ENCOUNTER — Ambulatory Visit (INDEPENDENT_AMBULATORY_CARE_PROVIDER_SITE_OTHER): Payer: 59 | Admitting: Cardiology

## 2013-08-14 DIAGNOSIS — I4891 Unspecified atrial fibrillation: Secondary | ICD-10-CM

## 2013-08-14 DIAGNOSIS — Z7901 Long term (current) use of anticoagulants: Secondary | ICD-10-CM

## 2013-08-15 ENCOUNTER — Ambulatory Visit: Payer: 59 | Admitting: Internal Medicine

## 2013-08-27 ENCOUNTER — Other Ambulatory Visit: Payer: Self-pay | Admitting: Internal Medicine

## 2013-09-10 ENCOUNTER — Ambulatory Visit (INDEPENDENT_AMBULATORY_CARE_PROVIDER_SITE_OTHER): Payer: 59 | Admitting: Pharmacist

## 2013-09-10 DIAGNOSIS — I4891 Unspecified atrial fibrillation: Secondary | ICD-10-CM

## 2013-09-10 DIAGNOSIS — Z7901 Long term (current) use of anticoagulants: Secondary | ICD-10-CM

## 2013-09-10 LAB — POCT INR: INR: 1.99

## 2013-09-17 ENCOUNTER — Ambulatory Visit: Payer: Medicare Other | Attending: Orthopedic Surgery | Admitting: Physical Therapy

## 2013-09-17 DIAGNOSIS — Z4789 Encounter for other orthopedic aftercare: Secondary | ICD-10-CM | POA: Insufficient documentation

## 2013-09-17 DIAGNOSIS — R269 Unspecified abnormalities of gait and mobility: Secondary | ICD-10-CM | POA: Diagnosis not present

## 2013-09-17 DIAGNOSIS — R5381 Other malaise: Secondary | ICD-10-CM | POA: Diagnosis not present

## 2013-09-17 DIAGNOSIS — M6281 Muscle weakness (generalized): Secondary | ICD-10-CM | POA: Insufficient documentation

## 2013-09-21 ENCOUNTER — Ambulatory Visit: Payer: Medicare Other | Admitting: Physical Therapy

## 2013-09-21 DIAGNOSIS — Z4789 Encounter for other orthopedic aftercare: Secondary | ICD-10-CM | POA: Diagnosis not present

## 2013-09-26 ENCOUNTER — Ambulatory Visit: Payer: Medicare Other | Admitting: Physical Therapy

## 2013-09-26 DIAGNOSIS — Z4789 Encounter for other orthopedic aftercare: Secondary | ICD-10-CM | POA: Diagnosis not present

## 2013-09-28 ENCOUNTER — Ambulatory Visit: Payer: Medicare Other | Admitting: Physical Therapy

## 2013-09-28 DIAGNOSIS — Z4789 Encounter for other orthopedic aftercare: Secondary | ICD-10-CM | POA: Diagnosis not present

## 2013-10-03 ENCOUNTER — Ambulatory Visit: Payer: Medicare Other | Admitting: Physical Therapy

## 2013-10-03 DIAGNOSIS — Z4789 Encounter for other orthopedic aftercare: Secondary | ICD-10-CM | POA: Diagnosis not present

## 2013-10-04 LAB — PROTIME-INR: INR: 1.9 — AB (ref 0.9–1.1)

## 2013-10-05 ENCOUNTER — Ambulatory Visit: Payer: Medicare Other | Admitting: Physical Therapy

## 2013-10-08 ENCOUNTER — Encounter: Payer: Self-pay | Admitting: Internal Medicine

## 2013-10-10 ENCOUNTER — Ambulatory Visit: Payer: Medicare Other | Admitting: Physical Therapy

## 2013-10-10 DIAGNOSIS — Z4789 Encounter for other orthopedic aftercare: Secondary | ICD-10-CM | POA: Diagnosis not present

## 2013-10-11 ENCOUNTER — Ambulatory Visit (INDEPENDENT_AMBULATORY_CARE_PROVIDER_SITE_OTHER): Payer: Medicare Other | Admitting: Internal Medicine

## 2013-10-11 DIAGNOSIS — Z7901 Long term (current) use of anticoagulants: Secondary | ICD-10-CM

## 2013-10-11 DIAGNOSIS — I4891 Unspecified atrial fibrillation: Secondary | ICD-10-CM

## 2013-10-12 ENCOUNTER — Ambulatory Visit: Payer: Medicare Other | Attending: Orthopedic Surgery | Admitting: Physical Therapy

## 2013-10-12 DIAGNOSIS — R5381 Other malaise: Secondary | ICD-10-CM | POA: Insufficient documentation

## 2013-10-12 DIAGNOSIS — Z4789 Encounter for other orthopedic aftercare: Secondary | ICD-10-CM | POA: Insufficient documentation

## 2013-10-12 DIAGNOSIS — M6281 Muscle weakness (generalized): Secondary | ICD-10-CM | POA: Insufficient documentation

## 2013-10-12 DIAGNOSIS — R269 Unspecified abnormalities of gait and mobility: Secondary | ICD-10-CM | POA: Insufficient documentation

## 2013-10-17 ENCOUNTER — Encounter: Payer: Medicare Other | Admitting: Physical Therapy

## 2013-10-19 ENCOUNTER — Encounter: Payer: Medicare Other | Admitting: Physical Therapy

## 2013-10-24 ENCOUNTER — Encounter: Payer: Medicare Other | Admitting: Physical Therapy

## 2013-10-26 ENCOUNTER — Ambulatory Visit: Payer: Medicare Other | Admitting: Physical Therapy

## 2013-10-26 DIAGNOSIS — Z4789 Encounter for other orthopedic aftercare: Secondary | ICD-10-CM | POA: Diagnosis not present

## 2013-10-31 ENCOUNTER — Ambulatory Visit: Payer: Medicare Other | Admitting: Physical Therapy

## 2013-11-02 ENCOUNTER — Encounter: Payer: Medicare Other | Admitting: Physical Therapy

## 2013-11-07 ENCOUNTER — Ambulatory Visit: Payer: Medicare Other | Admitting: Physical Therapy

## 2013-11-07 DIAGNOSIS — Z4789 Encounter for other orthopedic aftercare: Secondary | ICD-10-CM | POA: Diagnosis not present

## 2013-11-08 LAB — PROTIME-INR: INR: 2.3 — AB (ref <=1.1)

## 2013-11-09 ENCOUNTER — Encounter: Payer: Medicare Other | Admitting: Physical Therapy

## 2013-11-14 ENCOUNTER — Encounter: Payer: Medicare Other | Admitting: Physical Therapy

## 2013-11-16 ENCOUNTER — Ambulatory Visit: Payer: Medicare Other | Attending: Orthopedic Surgery | Admitting: Physical Therapy

## 2013-11-16 DIAGNOSIS — Z4789 Encounter for other orthopedic aftercare: Secondary | ICD-10-CM | POA: Diagnosis present

## 2013-11-16 DIAGNOSIS — R5381 Other malaise: Secondary | ICD-10-CM | POA: Insufficient documentation

## 2013-11-16 DIAGNOSIS — R269 Unspecified abnormalities of gait and mobility: Secondary | ICD-10-CM | POA: Insufficient documentation

## 2013-11-16 DIAGNOSIS — M6281 Muscle weakness (generalized): Secondary | ICD-10-CM | POA: Diagnosis not present

## 2013-11-19 ENCOUNTER — Ambulatory Visit (INDEPENDENT_AMBULATORY_CARE_PROVIDER_SITE_OTHER): Payer: Medicare Other | Admitting: Cardiology

## 2013-11-19 DIAGNOSIS — Z7901 Long term (current) use of anticoagulants: Secondary | ICD-10-CM

## 2013-11-19 DIAGNOSIS — I4891 Unspecified atrial fibrillation: Secondary | ICD-10-CM

## 2013-12-06 LAB — PROTIME-INR: INR: 2.4 — AB (ref 0.9–1.1)

## 2013-12-11 ENCOUNTER — Ambulatory Visit (INDEPENDENT_AMBULATORY_CARE_PROVIDER_SITE_OTHER): Payer: Medicare Other | Admitting: Cardiovascular Disease

## 2013-12-11 DIAGNOSIS — Z7901 Long term (current) use of anticoagulants: Secondary | ICD-10-CM

## 2013-12-13 ENCOUNTER — Other Ambulatory Visit: Payer: Self-pay | Admitting: Endocrinology

## 2014-01-03 LAB — PROTIME-INR: INR: 2.5 — AB (ref 0.9–1.1)

## 2014-01-07 ENCOUNTER — Ambulatory Visit (INDEPENDENT_AMBULATORY_CARE_PROVIDER_SITE_OTHER): Payer: Medicare Other | Admitting: Pharmacist

## 2014-01-07 DIAGNOSIS — I4891 Unspecified atrial fibrillation: Secondary | ICD-10-CM

## 2014-01-07 DIAGNOSIS — Z7901 Long term (current) use of anticoagulants: Secondary | ICD-10-CM

## 2014-02-01 ENCOUNTER — Ambulatory Visit (INDEPENDENT_AMBULATORY_CARE_PROVIDER_SITE_OTHER): Payer: Medicare Other | Admitting: Cardiovascular Disease

## 2014-02-01 DIAGNOSIS — Z7901 Long term (current) use of anticoagulants: Secondary | ICD-10-CM

## 2014-02-01 LAB — PROTIME-INR: INR: 3.6 — AB (ref 0.9–1.1)

## 2014-02-07 LAB — PROTIME-INR: INR: 2.3 — AB (ref 0.9–1.1)

## 2014-02-08 ENCOUNTER — Other Ambulatory Visit: Payer: Self-pay | Admitting: Internal Medicine

## 2014-02-11 ENCOUNTER — Ambulatory Visit (INDEPENDENT_AMBULATORY_CARE_PROVIDER_SITE_OTHER): Payer: Medicare Other | Admitting: Internal Medicine

## 2014-02-11 DIAGNOSIS — Z7901 Long term (current) use of anticoagulants: Secondary | ICD-10-CM

## 2014-02-11 DIAGNOSIS — I4891 Unspecified atrial fibrillation: Secondary | ICD-10-CM

## 2014-02-12 ENCOUNTER — Encounter (HOSPITAL_COMMUNITY): Payer: Self-pay | Admitting: *Deleted

## 2014-02-12 ENCOUNTER — Other Ambulatory Visit (HOSPITAL_COMMUNITY): Payer: Self-pay | Admitting: Orthopedic Surgery

## 2014-02-12 ENCOUNTER — Encounter (HOSPITAL_COMMUNITY): Payer: Self-pay | Admitting: Pharmacy Technician

## 2014-02-12 NOTE — Progress Notes (Signed)
Spoke with Dr. Sharol Given regarding pt Coumadin instructions pre-operatively. MD confirmed that it was okay for pt to take PM dose of Coumadin as ordered tonight. Pt stated that he had an EKG at [facility] recently; records requested.

## 2014-02-13 ENCOUNTER — Encounter (HOSPITAL_COMMUNITY): Payer: Medicare Other | Admitting: Anesthesiology

## 2014-02-13 ENCOUNTER — Ambulatory Visit (HOSPITAL_COMMUNITY): Payer: Medicare Other | Admitting: Anesthesiology

## 2014-02-13 ENCOUNTER — Ambulatory Visit (HOSPITAL_COMMUNITY)
Admission: RE | Admit: 2014-02-13 | Discharge: 2014-02-13 | Disposition: A | Discharge status: Home / Self Care | Payer: Medicare Other | Source: Ambulatory Visit | Attending: Orthopedic Surgery | Admitting: Orthopedic Surgery

## 2014-02-13 ENCOUNTER — Encounter (HOSPITAL_COMMUNITY): Payer: Self-pay | Admitting: Surgery

## 2014-02-13 ENCOUNTER — Encounter (HOSPITAL_COMMUNITY)
Admission: RE | Disposition: A | Discharge status: Home / Self Care | Payer: Self-pay | Source: Ambulatory Visit | Attending: Orthopedic Surgery

## 2014-02-13 DIAGNOSIS — E785 Hyperlipidemia, unspecified: Secondary | ICD-10-CM | POA: Diagnosis not present

## 2014-02-13 DIAGNOSIS — M109 Gout, unspecified: Secondary | ICD-10-CM | POA: Diagnosis not present

## 2014-02-13 DIAGNOSIS — Z7901 Long term (current) use of anticoagulants: Secondary | ICD-10-CM | POA: Insufficient documentation

## 2014-02-13 DIAGNOSIS — M86142 Other acute osteomyelitis, left hand: Secondary | ICD-10-CM

## 2014-02-13 DIAGNOSIS — N529 Male erectile dysfunction, unspecified: Secondary | ICD-10-CM | POA: Insufficient documentation

## 2014-02-13 DIAGNOSIS — Z87891 Personal history of nicotine dependence: Secondary | ICD-10-CM | POA: Insufficient documentation

## 2014-02-13 DIAGNOSIS — I2510 Atherosclerotic heart disease of native coronary artery without angina pectoris: Secondary | ICD-10-CM | POA: Diagnosis not present

## 2014-02-13 DIAGNOSIS — L02519 Cutaneous abscess of unspecified hand: Secondary | ICD-10-CM | POA: Diagnosis not present

## 2014-02-13 DIAGNOSIS — M869 Osteomyelitis, unspecified: Secondary | ICD-10-CM | POA: Insufficient documentation

## 2014-02-13 DIAGNOSIS — Z794 Long term (current) use of insulin: Secondary | ICD-10-CM | POA: Insufficient documentation

## 2014-02-13 DIAGNOSIS — L03019 Cellulitis of unspecified finger: Secondary | ICD-10-CM | POA: Diagnosis not present

## 2014-02-13 DIAGNOSIS — N186 End stage renal disease: Secondary | ICD-10-CM | POA: Diagnosis not present

## 2014-02-13 DIAGNOSIS — E1142 Type 2 diabetes mellitus with diabetic polyneuropathy: Secondary | ICD-10-CM | POA: Diagnosis not present

## 2014-02-13 DIAGNOSIS — K219 Gastro-esophageal reflux disease without esophagitis: Secondary | ICD-10-CM | POA: Insufficient documentation

## 2014-02-13 DIAGNOSIS — I4891 Unspecified atrial fibrillation: Secondary | ICD-10-CM | POA: Insufficient documentation

## 2014-02-13 DIAGNOSIS — E1149 Type 2 diabetes mellitus with other diabetic neurological complication: Secondary | ICD-10-CM | POA: Diagnosis not present

## 2014-02-13 DIAGNOSIS — I739 Peripheral vascular disease, unspecified: Secondary | ICD-10-CM | POA: Insufficient documentation

## 2014-02-13 DIAGNOSIS — I120 Hypertensive chronic kidney disease with stage 5 chronic kidney disease or end stage renal disease: Secondary | ICD-10-CM | POA: Insufficient documentation

## 2014-02-13 HISTORY — PX: AMPUTATION: SHX166

## 2014-02-13 HISTORY — DX: Depression, unspecified: F32.A

## 2014-02-13 HISTORY — DX: Major depressive disorder, single episode, unspecified: F32.9

## 2014-02-13 LAB — COMPREHENSIVE METABOLIC PANEL
ALT: 9 U/L (ref 0–53)
AST: 10 U/L (ref 0–37)
Albumin: 3.2 g/dL — ABNORMAL LOW (ref 3.5–5.2)
Alkaline Phosphatase: 118 U/L — ABNORMAL HIGH (ref 39–117)
Anion gap: 22 — ABNORMAL HIGH (ref 5–15)
BILIRUBIN TOTAL: 0.7 mg/dL (ref 0.3–1.2)
BUN: 46 mg/dL — ABNORMAL HIGH (ref 6–23)
CO2: 21 meq/L (ref 19–32)
CREATININE: 5.68 mg/dL — AB (ref 0.50–1.35)
Calcium: 9.5 mg/dL (ref 8.4–10.5)
Chloride: 90 mEq/L — ABNORMAL LOW (ref 96–112)
GFR calc Af Amer: 11 mL/min — ABNORMAL LOW (ref >90)
GFR, EST NON AFRICAN AMERICAN: 10 mL/min — AB (ref >90)
GLUCOSE: 443 mg/dL — AB (ref 70–99)
Potassium: 4.4 mEq/L (ref 3.7–5.3)
Sodium: 133 mEq/L — ABNORMAL LOW (ref 137–147)
Total Protein: 7.4 g/dL (ref 6.0–8.3)

## 2014-02-13 LAB — CBC
HCT: 32.8 % — ABNORMAL LOW (ref 39.0–52.0)
HEMOGLOBIN: 10.6 g/dL — AB (ref 13.0–17.0)
MCH: 29 pg (ref 26.0–34.0)
MCHC: 32.3 g/dL (ref 30.0–36.0)
MCV: 89.9 fL (ref 78.0–100.0)
PLATELETS: 331 10*3/uL (ref 150–400)
RBC: 3.65 MIL/uL — AB (ref 4.22–5.81)
RDW: 14.6 % (ref 11.5–15.5)
WBC: 12.7 10*3/uL — ABNORMAL HIGH (ref 4.0–10.5)

## 2014-02-13 LAB — GLUCOSE, CAPILLARY
GLUCOSE-CAPILLARY: 434 mg/dL — AB (ref 70–99)
GLUCOSE-CAPILLARY: 397 mg/dL — AB (ref 70–99)
GLUCOSE-CAPILLARY: 148 mg/dL — AB (ref 70–99)
GLUCOSE-CAPILLARY: 134 mg/dL — AB (ref 70–99)
Glucose-Capillary: 114 mg/dL — ABNORMAL HIGH (ref 70–99)
Glucose-Capillary: 253 mg/dL — ABNORMAL HIGH (ref 70–99)
Glucose-Capillary: 117 mg/dL — ABNORMAL HIGH (ref 70–99)

## 2014-02-13 LAB — PROTIME-INR
INR: 3.29 — ABNORMAL HIGH (ref 0.00–1.49)
PROTHROMBIN TIME: 33.5 s — AB (ref 11.6–15.2)

## 2014-02-13 LAB — APTT: aPTT: 60 seconds — ABNORMAL HIGH (ref 24–37)

## 2014-02-13 SURGERY — AMPUTATION DIGIT
Anesthesia: General | Site: Hand | Laterality: Left

## 2014-02-13 MED ORDER — FENTANYL CITRATE 0.05 MG/ML IJ SOLN
INTRAMUSCULAR | Status: AC
  Filled 2014-02-13: qty 5

## 2014-02-13 MED ORDER — PROPOFOL 10 MG/ML IV BOLUS
INTRAVENOUS | Status: AC
  Filled 2014-02-13: qty 20

## 2014-02-13 MED ORDER — 0.9 % SODIUM CHLORIDE (POUR BTL) OPTIME
TOPICAL | Status: DC | PRN
  Administered 2014-02-13: 1000 mL

## 2014-02-13 MED ORDER — INSULIN ASPART 100 UNIT/ML ~~LOC~~ SOLN
SUBCUTANEOUS | Status: AC
  Administered 2014-02-13: 10 [IU] via INTRAVENOUS
  Filled 2014-02-13: qty 1

## 2014-02-13 MED ORDER — SODIUM CHLORIDE 0.9 % IV SOLN
INTRAVENOUS | Status: DC
  Administered 2014-02-13 (×2): via INTRAVENOUS

## 2014-02-13 MED ORDER — ROCURONIUM BROMIDE 50 MG/5ML IV SOLN
INTRAVENOUS | Status: AC
  Filled 2014-02-13: qty 1

## 2014-02-13 MED ORDER — FENTANYL CITRATE 0.05 MG/ML IJ SOLN
INTRAMUSCULAR | Status: DC | PRN
  Administered 2014-02-13 (×2): 50 ug via INTRAVENOUS

## 2014-02-13 MED ORDER — INSULIN ASPART 100 UNIT/ML ~~LOC~~ SOLN
SUBCUTANEOUS | Status: AC
  Administered 2014-02-13: 15 [IU] via INTRAVENOUS
  Filled 2014-02-13: qty 1

## 2014-02-13 MED ORDER — NEOSTIGMINE METHYLSULFATE 10 MG/10ML IV SOLN
INTRAVENOUS | Status: AC
  Filled 2014-02-13: qty 1

## 2014-02-13 MED ORDER — INSULIN ASPART 100 UNIT/ML ~~LOC~~ SOLN
15.0000 [IU] | Freq: Once | SUBCUTANEOUS | Status: AC
  Administered 2014-02-13: 15 [IU] via INTRAVENOUS

## 2014-02-13 MED ORDER — INSULIN ASPART 100 UNIT/ML ~~LOC~~ SOLN
10.0000 [IU] | Freq: Once | SUBCUTANEOUS | Status: AC
  Administered 2014-02-13: 10 [IU] via INTRAVENOUS

## 2014-02-13 MED ORDER — INSULIN ASPART 100 UNIT/ML ~~LOC~~ SOLN: 15.0000 [IU] | Freq: Once | SUBCUTANEOUS | Status: DC

## 2014-02-13 MED ORDER — HYDROCODONE-ACETAMINOPHEN 5-325 MG PO TABS: 1.0000 | ORAL_TABLET | Freq: Four times a day (QID) | ORAL | Status: DC | PRN

## 2014-02-13 MED ORDER — ARTIFICIAL TEARS OP OINT
TOPICAL_OINTMENT | OPHTHALMIC | Status: AC
  Filled 2014-02-13: qty 3.5

## 2014-02-13 MED ORDER — ONDANSETRON HCL 4 MG/2ML IJ SOLN
INTRAMUSCULAR | Status: DC | PRN
Start: 2014-02-13 — End: 2014-02-13
  Administered 2014-02-13: 4 mg via INTRAVENOUS

## 2014-02-13 MED ORDER — MIDAZOLAM HCL 5 MG/5ML IJ SOLN
INTRAMUSCULAR | Status: DC | PRN
  Administered 2014-02-13: 2 mg via INTRAVENOUS

## 2014-02-13 MED ORDER — PROPOFOL 10 MG/ML IV BOLUS
INTRAVENOUS | Status: DC | PRN
  Administered 2014-02-13: 100 mg via INTRAVENOUS

## 2014-02-13 MED ORDER — ONDANSETRON HCL 4 MG/2ML IJ SOLN
INTRAMUSCULAR | Status: AC
  Filled 2014-02-13: qty 2

## 2014-02-13 MED ORDER — CEFAZOLIN SODIUM-DEXTROSE 2-3 GM-% IV SOLR
2.0000 g | INTRAVENOUS | Status: AC
  Administered 2014-02-13: 2 g via INTRAVENOUS
  Filled 2014-02-13: qty 50

## 2014-02-13 MED ORDER — GLYCOPYRROLATE 0.2 MG/ML IJ SOLN
INTRAMUSCULAR | Status: AC
  Filled 2014-02-13: qty 2

## 2014-02-13 MED ORDER — KETAMINE HCL 10 MG/ML IJ SOLN
INTRAMUSCULAR | Status: DC | PRN
  Administered 2014-02-13: 20 mg via INTRAVENOUS

## 2014-02-13 MED ORDER — ACETAMINOPHEN 10 MG/ML IV SOLN
INTRAVENOUS | Status: AC
  Filled 2014-02-13: qty 100

## 2014-02-13 MED ORDER — MIDAZOLAM HCL 2 MG/2ML IJ SOLN
INTRAMUSCULAR | Status: AC
  Filled 2014-02-13: qty 2

## 2014-02-13 MED ORDER — ACETAMINOPHEN 10 MG/ML IV SOLN
INTRAVENOUS | Status: DC | PRN
  Administered 2014-02-13: 1000 mg via INTRAVENOUS

## 2014-02-13 MED ORDER — LIDOCAINE HCL (CARDIAC) 20 MG/ML IV SOLN
INTRAVENOUS | Status: DC | PRN
  Administered 2014-02-13: 100 mg via INTRAVENOUS

## 2014-02-13 SURGICAL SUPPLY — 34 items
BNDG CMPR 9X4 STRL LF SNTH (GAUZE/BANDAGES/DRESSINGS)
BNDG COHESIVE 4X5 TAN STRL (GAUZE/BANDAGES/DRESSINGS) ×3 IMPLANT
BNDG ESMARK 4X9 LF (GAUZE/BANDAGES/DRESSINGS) IMPLANT
BNDG GAUZE ELAST 4 BULKY (GAUZE/BANDAGES/DRESSINGS) ×3 IMPLANT
COVER SURGICAL LIGHT HANDLE (MISCELLANEOUS) ×3 IMPLANT
DRAPE U-SHAPE 47X51 STRL (DRAPES) ×3 IMPLANT
DRSG ADAPTIC 3X8 NADH LF (GAUZE/BANDAGES/DRESSINGS) ×2 IMPLANT
DRSG PAD ABDOMINAL 8X10 ST (GAUZE/BANDAGES/DRESSINGS) ×3 IMPLANT
DURAPREP 26ML APPLICATOR (WOUND CARE) ×3 IMPLANT
ELECT REM PT RETURN 9FT ADLT (ELECTROSURGICAL) ×3
ELECTRODE REM PT RTRN 9FT ADLT (ELECTROSURGICAL) ×1 IMPLANT
GAUZE SPONGE 4X4 12PLY STRL (GAUZE/BANDAGES/DRESSINGS) ×2 IMPLANT
GLOVE BIOGEL PI IND STRL 7.5 (GLOVE) IMPLANT
GLOVE BIOGEL PI IND STRL 9 (GLOVE) ×1 IMPLANT
GLOVE BIOGEL PI INDICATOR 7.5 (GLOVE) ×2
GLOVE BIOGEL PI INDICATOR 9 (GLOVE) ×2
GLOVE ECLIPSE 7.0 STRL STRAW (GLOVE) ×2 IMPLANT
GLOVE SURG ORTHO 9.0 STRL STRW (GLOVE) ×3 IMPLANT
GOWN STRL REUS W/ TWL LRG LVL3 (GOWN DISPOSABLE) IMPLANT
GOWN STRL REUS W/ TWL XL LVL3 (GOWN DISPOSABLE) ×2 IMPLANT
GOWN STRL REUS W/TWL LRG LVL3 (GOWN DISPOSABLE) ×3
GOWN STRL REUS W/TWL XL LVL3 (GOWN DISPOSABLE) ×6
KIT BASIN OR (CUSTOM PROCEDURE TRAY) ×3 IMPLANT
KIT ROOM TURNOVER OR (KITS) ×3 IMPLANT
MANIFOLD NEPTUNE II (INSTRUMENTS) ×1 IMPLANT
NEEDLE 22X1 1/2 (OR ONLY) (NEEDLE) IMPLANT
NS IRRIG 1000ML POUR BTL (IV SOLUTION) ×3 IMPLANT
PACK ORTHO EXTREMITY (CUSTOM PROCEDURE TRAY) ×3 IMPLANT
PAD ARMBOARD 7.5X6 YLW CONV (MISCELLANEOUS) ×6 IMPLANT
SUCTION FRAZIER TIP 10 FR DISP (SUCTIONS) IMPLANT
SUT ETHILON 2 0 PSLX (SUTURE) ×5 IMPLANT
SYR CONTROL 10ML LL (SYRINGE) IMPLANT
TOWEL OR 17X24 6PK STRL BLUE (TOWEL DISPOSABLE) ×3 IMPLANT
TOWEL OR 17X26 10 PK STRL BLUE (TOWEL DISPOSABLE) ×1 IMPLANT

## 2014-02-13 NOTE — Progress Notes (Signed)
Nurse called Dr. Tresa Moore and informed him that patients blood sugar was 434 and that patient did not take his Novolog insulin yesterday. Dr. Tresa Moore ordered for patient to have 15 units of Novolog IV and to recheck blood sugar in 15 minutes. Will enter orders and administer medication.

## 2014-02-13 NOTE — Op Note (Signed)
02/13/2014  12:01 PM  PATIENT:  [name]    PRE-OPERATIVE DIAGNOSIS:  Left Long Finger Osteomyelitis and abscess  POST-OPERATIVE DIAGNOSIS:  Same  PROCEDURE:  Left Long Finger Ray Amputation   SURGEON:  Newt Minion, MD  PHYSICIAN ASSISTANT:None ANESTHESIA:   General  PREOPERATIVE INDICATIONS:  [name] is a  63 y.o. male with a diagnosis of Left Long Finger Osteomyelitis who failed conservative measures and elected for surgical management.    The risks benefits and alternatives were discussed with the patient preoperatively including but not limited to the risks of infection, bleeding, nerve injury, cardiopulmonary complications, the need for revision surgery, among others, and the patient was willing to proceed.  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 Patient has developed progressive abscess and cellulitis involving the entire finger and presents at this time for ray amputation. Risks and benefits were discussed patient states he understands and wishes to proceed at this time.  Description of procedure patient was brought to the operating room and underwent a general anesthetic. After adequate levels of anesthesia were obtained patient's left upper extremity was prepped using DuraPrep draped into a sterile field. A timeout was called. A V. incision was made just proximal to the MTP joint of the long finger. The flexor and extensor tendons were pulled cut and allowed to retract there is no signs of any tenosynovitis early abscess up radiating proximally up the flexor tendon sheath. The metacarpal was amputated through the mid shaft. Hemostasis was obtained. There was minimal petechial bleeding. Skin was closed using 2-0  nylon. The web space was closed down. The wound is covered with a sterile compressive dressing. Patient still had a hematoma over the lateral aspect of the hyper thenar eminence and this was stable with no cellulitis this was left intact. Patient was extubated taken to the PACU in stable condition plan for discharge to home.

## 2014-02-13 NOTE — Transfer of Care (Signed)
Immediate Anesthesia Transfer of Care Note  Patient: [name]  Procedure(s) Performed: Procedure(s): Left Long Finger Amputation PIP Joint (Left)  Patient Location: PACU  Anesthesia Type:General  Level of Consciousness: awake, alert  and oriented  Airway & Oxygen Therapy: Patient Spontanous Breathing and Patient connected to face mask oxygen  Post-op Assessment: Report given to PACU RN  Post vital signs: Reviewed and stable  Complications: No apparent anesthesia complications

## 2014-02-13 NOTE — Anesthesia Preprocedure Evaluation (Addendum)
Anesthesia Evaluation  Patient identified by MRN, date of birth, ID band Patient awake    Reviewed: Allergy & Precautions, H&P , NPO status , Patient's Chart, lab work & pertinent test results  Airway Mallampati: II TM Distance: >3 FB     Dental  (+) Teeth Intact, Dental Advisory Given   Pulmonary former smoker (quit 07/2013),  breath sounds clear to auscultation        Cardiovascular hypertension, + CAD + dysrhythmias (AF today  has had ablations.  on coumadin  skiped last pm dose.) Ventricular Fibrillation Rhythm:Irregular     Neuro/Psych Depression    GI/Hepatic GERD-  Medicated,  Endo/Other  diabetes, Poorly Controlled, Type 2, Insulin DependentGlu 400 this am.  No insulin this am.  Will give 15 units now and repeat glucose soon.  Want to see a down trend before going to OR.  Renal/GU DialysisRenal disease: dialysis x 4 years, hemo, last yesterday, DM etiology most likely.     Musculoskeletal   Abdominal (+)  Abdomen: soft.    Peds  Hematology   Anesthesia Other Findings   Reproductive/Obstetrics                         Anesthesia Physical Anesthesia Plan  ASA: IV  Anesthesia Plan: General   Post-op Pain Management:    Induction: Intravenous  Airway Management Planned: Oral ETT and LMA  Additional Equipment:   Intra-op Plan:   Post-operative Plan:   Informed Consent: I have reviewed the patients History and Physical, chart, labs and discussed the procedure including the risks, benefits and alternatives for the proposed anesthesia with the patient or authorized representative who has indicated his/her understanding and acceptance.     Plan Discussed with:   Anesthesia Plan Comments:         Anesthesia Quick Evaluation

## 2014-02-13 NOTE — H&P (Signed)
[name] is an 63 y.o. male.   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 He is undergone conservative therapy with nail excision and antibiotic dressing changes as well as oral antibiotics without relief.  Past Medical History  Diagnosis Date  . GERD (gastroesophageal reflux disease)   . Hypertension   . Neuromuscular disorder     neurophathy  . Atrial fibrillation     chronically anticoagulated with coumadin  . Hyperlipidemia   . Atrial flutter 07/07/2009    s/p ablation  . GOUT 08/01/2008  . ERECTILE DYSFUNCTION 08/01/2008  . PERIPHERAL NEUROPATHY 08/01/2008  . CORONARY ARTERY DISEASE 08/01/2008    non obstructive per cath 1/10; Dr. Olevia Perches  . 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 Gastroparesis 08/01/2008  . NEPHROLITHIASIS, HX OF 08/01/2008  . Chronic systolic heart failure 07/19/3662  . COPD 07/21/2010  . Diverticulosis of colon (without mention of hemorrhage) 10/28/2010  . Benign neoplasm of colon 10/28/2010  . Hemorrhoids, internal 10/28/2010  . Second degree Mobitz I AV block     asymptomatic  . Cyst     "I've had 4-5; pretty much all of them have got infected" (06/13/2013)  . Muscle spasm of back     back muscle spasms and pt is unable to lay flat   . Abscess of back     lower middle portion of back  . Peripheral vascular disease     poor circulation  . Pneumonia     "first time I've been dx'd was today" (06/13/2013)  . Shortness of breath     "just related to this pneumonia" (06/13/2013)  . Type II diabetes mellitus dx'd 1980's  . Migraines     "none since the 1980's" (06/13/2013)  . End stage renal failure on dialysis 08/01/2008    "[facility]; TTS" (06/13/2013)  . Renal cyst     bilateral  . Basal cell carcinoma     nose  . Depression     situaltional     Past  Surgical History  Procedure Laterality Date  . Atrial ablation surgery  07-2008, 07/08/09    Dr Rayann Heman  . Foot surgery Right 09-2009    infection got into the bone; had to put rod in to hold foot together" (06/13/2013)  . Colonoscopy  2011?    Dr.Orr  . Av fistula placement Left 2010    upper arm  . Elbow fracture surgery Right 1974  . Pilonidal cyst excision  1971  . Cataract extraction Right ~ 2012  . I&d extremity Left 12/29/2012    Procedure: IRRIGATION AND DEBRIDEMENT EXTREMITY;  Surgeon: Newt Minion, MD;  Location: [facility];  Service: Orthopedics;  Laterality: Left;  Irrigation and Debridement Left Leg, VAC, [place]  . Toe amputation Right 01/18/2013    "little toe" Dr Sharol Given  . I&d extremity Bilateral 01/18/2013    Procedure: IRRIGATION AND DEBRIDEMENT EXTREMITYLEFT LEG AND RIGHT FOOT;  Surgeon: Newt Minion, MD;  Location: [facility];  Service: Orthopedics;  Laterality: Bilateral;  Left Leg Excisional Debridement  . Skin graft Left     left leg within the past month  . Amputation Left 02/02/2013    Procedure: AMPUTATION BELOW KNEE;  Surgeon: Newt Minion, MD;  Location: [facility];  Service: Orthopedics;  Laterality: Left;  Left Below Knee Amputation  .  Fracture surgery    . Vasectomy    . Amputation Left 03/23/2013    Procedure: REVISION OF BELOW THE KNEE  AMPUTATION ;  Surgeon: Newt Minion, MD;  Location: [facility];  Service: Orthopedics;  Laterality: Left;  Left Below Knee Amputation Revision  . I&d extremity Left 05/04/2013    Procedure: Irrigation and Debridement Left Below Knee Ampuation;  Surgeon: Newt Minion, MD;  Location: [facility];  Service: Orthopedics;  Laterality: Left;  Irrigation and Debridement Left Below Knee Ampuation  . Amputation Left 05/30/2013    Procedure: REVISION AMPUTATION BELOW KNEE;  Surgeon: Newt Minion, MD;  Location: [facility];  Service: Orthopedics;  Laterality: Left;  Revision Left Below Knee Amputation, Possible Above Knee Amputation  . Tonsillectomy      Family  History  Problem Relation Age of Onset  . Diabetes Mother   . Diabetes Maternal Grandmother   . Diabetes Paternal Grandmother   . Cancer Neg Hx     no FH of Colon Cancer   Social History:  reports that he quit smoking about 6 months ago. His smoking use included Cigarettes. He has a 22 pack-year smoking history. He has never used smokeless tobacco. He reports that he uses illicit drugs (Marijuana). He reports that he does not drink alcohol.  Allergies: No Known Allergies  No prescriptions prior to admission    No results found for this or any previous visit (from the past 48 hour(s)). No results found.  Review of Systems  All other systems reviewed and are negative.   There were no vitals taken for this visit. Physical Exam  On examination patient has a palpable pulse. Radiograph shows complete destruction of the distal phalanx of the left long finger. Patient has cellulitis which extends to the PIP joint. Assessment/Plan Assessment: Cellulitis and chronic osteomyelitis left long finger.  Plan: Will plan for an amputation through the PIP joint left long finger. Risks and benefits were discussed including risk of the incision not healing. Risk of high level amputation. Patient states he understands and wished to proceed at this time.  DUDA,MARCUS V 02/13/2014, 6:42 AM

## 2014-02-13 NOTE — Progress Notes (Signed)
Nurse in to recheck blood glucose and it was 397. Nurse then called Dr. Tresa Moore and informed him of this. Dr. Tresa Moore ordered for Nurse to give patient a additional 10 units of Novolog IV. Will enter orders and administer.

## 2014-02-13 NOTE — Discharge Instructions (Signed)
Change dressing in 5 days wash with soap and water and apply a new sterile dressing.  What to eat:  For your first meals, you should eat lightly; only small meals initially.  If you do not have nausea, you may eat larger meals.  Avoid spicy, greasy and heavy food.    General Anesthesia, Adult, Care After  Refer to this sheet in the next few weeks. These instructions provide you with information on caring for yourself after your procedure. Your health care provider may also give you more specific instructions. Your treatment has been planned according to current medical practices, but problems sometimes occur. Call your health care provider if you have any problems or questions after your procedure.  WHAT TO EXPECT AFTER THE PROCEDURE  After the procedure, it is typical to experience:  Sleepiness.  Nausea and vomiting. HOME CARE INSTRUCTIONS  For the first 24 hours after general anesthesia:  Have a responsible person with you.  Do not drive a car. If you are alone, do not take public transportation.  Do not drink alcohol.  Do not take medicine that has not been prescribed by your health care provider.  Do not sign important papers or make important decisions.  You may resume a normal diet and activities as directed by your health care provider.  Change bandages (dressings) as directed.  If you have questions or problems that seem related to general anesthesia, call the hospital and ask for the anesthetist or anesthesiologist on call. SEEK MEDICAL CARE IF:  You have nausea and vomiting that continue the day after anesthesia.  You develop a rash. SEEK IMMEDIATE MEDICAL CARE IF:  You have difficulty breathing.  You have chest pain.  You have any allergic problems. Document Released: 09/06/2000 Document Revised: 01/31/2013 Document Reviewed: 12/14/2012  [facility] Patient Information 2014 [place], [place].

## 2014-02-13 NOTE — Anesthesia Postprocedure Evaluation (Signed)
   Anesthesia Post-op Note  Patient: [name]  Procedure(s) Performed: Procedure(s): Left Long Finger Amputation PIP Joint (Left)  Patient Location: PACU  Anesthesia Type:General  Level of Consciousness: awake, alert  and oriented  Airway and Oxygen Therapy: Patient Spontanous Breathing and Patient connected to nasal cannula oxygen  Post-op Pain: mild  Post-op Assessment: Post-op Vital signs reviewed, Patient's Cardiovascular Status Stable and Respiratory Function Stable  Post-op Vital Signs: Reviewed and stable  Last Vitals:  Filed Vitals:   02/13/14 1300  BP: 114/68  Pulse: 95  Temp:   Resp: 15    Complications: No apparent anesthesia complications

## 2014-02-14 ENCOUNTER — Encounter (HOSPITAL_COMMUNITY): Payer: Self-pay | Admitting: Orthopedic Surgery

## 2014-02-19 LAB — PROTIME-INR: INR: 4.1 — AB (ref 0.9–1.1)

## 2014-02-20 ENCOUNTER — Ambulatory Visit (INDEPENDENT_AMBULATORY_CARE_PROVIDER_SITE_OTHER): Payer: Medicare Other | Admitting: Pharmacist

## 2014-02-20 DIAGNOSIS — Z7901 Long term (current) use of anticoagulants: Secondary | ICD-10-CM

## 2014-02-20 DIAGNOSIS — I4891 Unspecified atrial fibrillation: Secondary | ICD-10-CM

## 2014-02-27 ENCOUNTER — Ambulatory Visit: Payer: Medicare Other | Admitting: Endocrinology

## 2014-02-28 LAB — PROTIME-INR: INR: 1.6 — AB (ref 0.9–1.1)

## 2014-03-01 ENCOUNTER — Ambulatory Visit (INDEPENDENT_AMBULATORY_CARE_PROVIDER_SITE_OTHER): Payer: Medicare Other | Admitting: Internal Medicine

## 2014-03-01 DIAGNOSIS — Z7901 Long term (current) use of anticoagulants: Secondary | ICD-10-CM

## 2014-03-06 ENCOUNTER — Other Ambulatory Visit: Payer: Self-pay

## 2014-03-06 MED ORDER — INSULIN ASPART 100 UNIT/ML ~~LOC~~ SOLN: 30.0000 [IU] | Freq: Three times a day (TID) | SUBCUTANEOUS | Status: DC

## 2014-03-08 ENCOUNTER — Ambulatory Visit (INDEPENDENT_AMBULATORY_CARE_PROVIDER_SITE_OTHER): Payer: Medicare Other | Admitting: Endocrinology

## 2014-03-08 ENCOUNTER — Encounter: Payer: Self-pay | Admitting: Endocrinology

## 2014-03-08 ENCOUNTER — Ambulatory Visit (INDEPENDENT_AMBULATORY_CARE_PROVIDER_SITE_OTHER): Payer: 59 | Admitting: Cardiovascular Disease

## 2014-03-08 VITALS — BP 118/72 | HR 97 | Temp 98.3°F | Ht 73.0 in | Wt 230.0 lb

## 2014-03-08 DIAGNOSIS — D649 Anemia, unspecified: Secondary | ICD-10-CM | POA: Diagnosis not present

## 2014-03-08 DIAGNOSIS — E119 Type 2 diabetes mellitus without complications: Secondary | ICD-10-CM | POA: Diagnosis not present

## 2014-03-08 DIAGNOSIS — E785 Hyperlipidemia, unspecified: Secondary | ICD-10-CM | POA: Diagnosis not present

## 2014-03-08 DIAGNOSIS — Z79899 Other long term (current) drug therapy: Secondary | ICD-10-CM

## 2014-03-08 DIAGNOSIS — I10 Essential (primary) hypertension: Secondary | ICD-10-CM

## 2014-03-08 DIAGNOSIS — N259 Disorder resulting from impaired renal tubular function, unspecified: Secondary | ICD-10-CM

## 2014-03-08 DIAGNOSIS — M109 Gout, unspecified: Secondary | ICD-10-CM

## 2014-03-08 DIAGNOSIS — I4891 Unspecified atrial fibrillation: Secondary | ICD-10-CM

## 2014-03-08 DIAGNOSIS — E1059 Type 1 diabetes mellitus with other circulatory complications: Secondary | ICD-10-CM | POA: Diagnosis not present

## 2014-03-08 DIAGNOSIS — Z0000 Encounter for general adult medical examination without abnormal findings: Secondary | ICD-10-CM

## 2014-03-08 DIAGNOSIS — Z7901 Long term (current) use of anticoagulants: Secondary | ICD-10-CM

## 2014-03-08 DIAGNOSIS — Z125 Encounter for screening for malignant neoplasm of prostate: Secondary | ICD-10-CM

## 2014-03-08 LAB — LIPID PANEL
CHOLESTEROL: 143 mg/dL (ref 0–200)
HDL: 38.4 mg/dL — AB (ref >39.00)
LDL CALC: 82 mg/dL (ref 0–99)
NonHDL: 104.6
TRIGLYCERIDES: 115 mg/dL (ref 0.0–149.0)
Total CHOL/HDL Ratio: 4
VLDL: 23 mg/dL (ref 0.0–40.0)

## 2014-03-08 LAB — HEPATIC FUNCTION PANEL
ALK PHOS: 98 U/L (ref 39–117)
ALT: 11 U/L (ref 0–53)
AST: 15 U/L (ref 0–37)
Albumin: 3.9 g/dL (ref 3.5–5.2)
BILIRUBIN DIRECT: 0.2 mg/dL (ref 0.0–0.3)
BILIRUBIN TOTAL: 0.8 mg/dL (ref 0.2–1.2)
TOTAL PROTEIN: 7.2 g/dL (ref 6.0–8.3)

## 2014-03-08 LAB — PSA: PSA: 0.65 ng/mL (ref 0.10–4.00)

## 2014-03-08 LAB — BASIC METABOLIC PANEL
BUN: 26 mg/dL — AB (ref 6–23)
CO2: 26 mEq/L (ref 19–32)
CREATININE: 4.9 mg/dL — AB (ref 0.4–1.5)
Calcium: 10 mg/dL (ref 8.4–10.5)
Chloride: 96 mEq/L (ref 96–112)
GFR: 12.67 mL/min — AB (ref >60.00)
GLUCOSE: 319 mg/dL — AB (ref 70–99)
Potassium: 4.6 mEq/L (ref 3.5–5.1)
Sodium: 136 mEq/L (ref 135–145)

## 2014-03-08 LAB — TSH: TSH: 0.81 u[IU]/mL (ref 0.35–4.50)

## 2014-03-08 LAB — PROTIME-INR: INR: 1.6 — AB (ref 0.9–1.1)

## 2014-03-08 LAB — HEMOGLOBIN A1C: Hgb A1c MFr Bld: 12.6 % — ABNORMAL HIGH (ref 4.6–6.5)

## 2014-03-08 NOTE — Progress Notes (Signed)
Subjective:    Patient ID: [name], male    DOB: Jan 06, 1951, 63 y.o.   MRN: [id number]  HPI Pt returns for f/u of diabetes mellitus:  DM type: insulin-requiring type 2. Dx'ed: 5956 Complications: painful neuropathy, foot ulcer, retinopathy, gastroparesis, CAD, and renal failure Therapy: Multiple daily insulin injections DKA: never Severe hypoglycemia: never Pancreatitis: never Other info: he has not needed basal insulin, prob due to renal failure.   Interval history:  no cbg record, but states cbg's are "usually about 200."  There is no trend throughout the day.  He says he misses the novolog approx 3 times a week.   Pt says the left hand swelling and redness is improving.   Past Medical History  Diagnosis Date  . GERD (gastroesophageal reflux disease)   . Hypertension   . Neuromuscular disorder     neurophathy  . Atrial fibrillation     chronically anticoagulated with coumadin  . Hyperlipidemia   . Atrial flutter 07/07/2009    s/p ablation  . GOUT 08/01/2008  . ERECTILE DYSFUNCTION 08/01/2008  . PERIPHERAL NEUROPATHY 08/01/2008  . CORONARY ARTERY DISEASE 08/01/2008    non obstructive per cath 1/10; Dr. Olevia Perches  . 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 Gastroparesis 08/01/2008  . NEPHROLITHIASIS, HX OF 08/01/2008  . Chronic systolic heart failure 08/20/7562  . COPD 07/21/2010  . Diverticulosis of colon (without mention of hemorrhage) 10/28/2010  . Benign neoplasm of colon 10/28/2010  . Hemorrhoids, internal 10/28/2010  . Second degree Mobitz I AV block     asymptomatic  . Cyst     "I've had 4-5; pretty much all of them have got infected" (06/13/2013)  . Muscle spasm of back     back muscle spasms and pt is unable to lay flat   . Abscess of back     lower middle portion of back  . Peripheral vascular disease     poor circulation  . Pneumonia     "first time I've been dx'd was today" (06/13/2013)  . Shortness of breath     "just related to this  pneumonia" (06/13/2013)  . Type II diabetes mellitus dx'd 1980's  . Migraines     "none since the 1980's" (06/13/2013)  . End stage renal failure on dialysis 08/01/2008    "[place]; TTS" (06/13/2013)  . Renal cyst     bilateral  . Basal cell carcinoma     nose  . Depression     situaltional     Past Surgical History  Procedure Laterality Date  . Atrial ablation surgery  07-2008, 07/08/09    Dr Rayann Heman  . Foot surgery Right 09-2009    infection got into the bone; had to put rod in to hold foot together" (06/13/2013)  . Colonoscopy  2011?    Dr.Orr  . Av fistula placement Left 2010    upper arm  . Elbow fracture surgery Right 1974  . Pilonidal cyst excision  1971  . Cataract extraction Right ~ 2012  . I&d extremity Left 12/29/2012    Procedure: IRRIGATION AND DEBRIDEMENT EXTREMITY;  Surgeon: Newt Minion, MD;  Location: [place];  Service: Orthopedics;  Laterality: Left;  Irrigation and Debridement Left Leg, VAC, [place]  . Toe amputation Right 01/18/2013    "little toe" Dr Sharol Given  . I&d extremity Bilateral 01/18/2013    Procedure: IRRIGATION AND DEBRIDEMENT EXTREMITYLEFT LEG AND RIGHT FOOT;  Surgeon: Newt Minion, MD;  Location: [place];  Service: Orthopedics;  Laterality: Bilateral;  Left Leg Excisional Debridement  . Skin graft Left     left leg within the past month  . Amputation Left 02/02/2013    Procedure: AMPUTATION BELOW KNEE;  Surgeon: Newt Minion, MD;  Location: [place];  Service: Orthopedics;  Laterality: Left;  Left Below Knee Amputation  . Fracture surgery    . Vasectomy    . Amputation Left 03/23/2013    Procedure: REVISION OF BELOW THE KNEE  AMPUTATION ;  Surgeon: Newt Minion, MD;  Location: [place];  Service: Orthopedics;  Laterality: Left;  Left Below Knee Amputation Revision  . I&d extremity Left 05/04/2013    Procedure: Irrigation and Debridement Left Below Knee Ampuation;  Surgeon: Newt Minion, MD;  Location: [place];  Service: Orthopedics;  Laterality: Left;   Irrigation and Debridement Left Below Knee Ampuation  . Amputation Left 05/30/2013    Procedure: REVISION AMPUTATION BELOW KNEE;  Surgeon: Newt Minion, MD;  Location: [place];  Service: Orthopedics;  Laterality: Left;  Revision Left Below Knee Amputation, Possible Above Knee Amputation  . Tonsillectomy    . Amputation Left 02/13/2014    Procedure: Left Long Finger Amputation PIP Joint;  Surgeon: Newt Minion, MD;  Location: [place];  Service: Orthopedics;  Laterality: Left;    History   Social History  . Marital Status: Married    Spouse Name: N/A    Number of Children: 3  . Years of Education: N/A   Occupational History  . Sales Rep    Social History Main Topics  . Smoking status: Former Smoker -- 0.50 packs/day for 44 years    Types: Cigarettes    Quit date: 07/17/2013  . Smokeless tobacco: Never Used  . Alcohol Use: No     Comment: 06/13/2013 "no alcohol in 40 yrs"  . Drug Use: Yes    Special: Marijuana     Comment: 06/13/2013 "used marijuana years ago"  . Sexual Activity: Not Currently   Other Topics Concern  . 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[employer]          Current Outpatient Prescriptions on File Prior to Visit  Medication Sig Dispense Refill  . albuterol (PROVENTIL HFA;VENTOLIN HFA) 108 (90 BASE) MCG/ACT inhaler Inhale 2 puffs into the lungs every 4 (four) hours as needed for wheezing or shortness of breath.  1 Inhaler  2  . calcium acetate (PHOSLO) 667 MG capsule Take 667 mg by mouth 3 (three) times daily with meals.      Marland Kitchen doxycycline (VIBRA-TABS) 100 MG tablet Take 100 mg by mouth daily.      Marland Kitchen ethyl chloride spray Apply 1 application topically daily as needed. For pain      . folic acid-vitamin b complex-vitamin c-selenium-zinc (DIALYVITE) 3 MG TABS tablet Take 1 tablet by mouth daily.      Marland Kitchen FOSRENOL 1000 MG chewable tablet Chew 2 tablets by mouth 3 (three) times daily with meals.       . gabapentin (NEURONTIN) 300 MG capsule  Take 300 mg by mouth at bedtime.       Marland Kitchen HYDROcodone-acetaminophen (NORCO) 5-325 MG per tablet Take 1 tablet by mouth every 6 (six) hours as needed.  30 tablet  0  . HYDROcodone-acetaminophen (NORCO/VICODIN) 5-325 MG per tablet Take 1 tablet by mouth every 4 (four) hours as needed for pain.      Marland Kitchen insulin aspart (  NOVOLOG) 100 UNIT/ML injection Inject 30 Units into the skin 3 (three) times daily before meals.  10 mL  0  . loperamide (IMODIUM) 2 MG capsule Take 2 mg by mouth daily as needed for diarrhea or loose stools.       Marland Kitchen omeprazole (PRILOSEC) 20 MG capsule Take 20 mg by mouth daily.      . temazepam (RESTORIL) 30 MG capsule Take 30 mg by mouth at bedtime.      Marland Kitchen tiotropium (SPIRIVA) 18 MCG inhalation capsule Place 1 capsule (18 mcg total) into inhaler and inhale daily.  30 capsule  12  . warfarin (COUMADIN) 5 MG tablet Take 5 mg by mouth daily.       No current facility-administered medications on file prior to visit.    No Known Allergies  Family History  Problem Relation Age of Onset  . Diabetes Mother   . Diabetes Maternal Grandmother   . Diabetes Paternal Grandmother   . Cancer Neg Hx     no FH of Colon Cancer    BP 118/72  Pulse 97  Temp(Src) 98.3 F (36.8 C) (Oral)  Ht 6\' 1"  (1.854 m)  Wt 230 lb (104.327 kg)  BMI 30.35 kg/m2  SpO2 98%   Review of Systems He denies hypoglycemia.  He has lost a few lbs.     Objective:   Physical Exam VITAL SIGNS:  See vs page GENERAL: no distress Ext: Left BKA; Right foot: 5th toe is surgically absent Skin: right foot is cool to touch.  Dry skin.  no ulcer.  Right leg: mild erythema. Neuro: sensation is intact to touch on the right foot, but decreased from normal. Pulses: right DP is absent  Left hand surgical site is red and swollen (but pt says this is steadily improving)  Lab Results  Component Value Date   HGBA1C 12.6* 03/08/2014      Assessment & Plan:  DM: severe exacerbation: he wants to continue multiple daily  injections S/p amputation of a finger, good healing Weight-loss, prob due to severe hyperglycemia. Renal failure, slightly worse. Dyslipidemia: well-controlled   Patient is advised the following: Patient Instructions  blood tests are being requested for you today.  We'll contact you with results.  Please come back soon for a regular physical appointment.  Well recheck your hand then also.

## 2014-03-08 NOTE — Patient Instructions (Addendum)
blood tests are being requested for you today.  We'll contact you with results.  Please come back soon for a regular physical appointment.  Well recheck your hand then also.

## 2014-03-19 LAB — POCT INR: INR: 1.74

## 2014-03-20 ENCOUNTER — Ambulatory Visit (INDEPENDENT_AMBULATORY_CARE_PROVIDER_SITE_OTHER): Payer: Medicare Other | Admitting: Internal Medicine

## 2014-03-20 DIAGNOSIS — I4891 Unspecified atrial fibrillation: Secondary | ICD-10-CM

## 2014-03-20 DIAGNOSIS — Z7901 Long term (current) use of anticoagulants: Secondary | ICD-10-CM

## 2014-03-27 ENCOUNTER — Ambulatory Visit (INDEPENDENT_AMBULATORY_CARE_PROVIDER_SITE_OTHER): Payer: Medicare Other | Admitting: Pharmacist

## 2014-03-27 DIAGNOSIS — Z7901 Long term (current) use of anticoagulants: Secondary | ICD-10-CM

## 2014-03-27 DIAGNOSIS — I4891 Unspecified atrial fibrillation: Secondary | ICD-10-CM

## 2014-03-27 LAB — PROTIME-INR: INR: 1.7 — AB (ref 0.9–1.1)

## 2014-03-29 ENCOUNTER — Other Ambulatory Visit: Payer: Self-pay | Admitting: Endocrinology

## 2014-03-29 ENCOUNTER — Telehealth: Payer: Self-pay

## 2014-03-29 MED ORDER — OMEPRAZOLE 20 MG PO CPDR: 20.0000 mg | DELAYED_RELEASE_CAPSULE | Freq: Every day | ORAL | Status: AC

## 2014-03-29 NOTE — Telephone Encounter (Signed)
Received a refill request for omeprazole . Medication is listed under historical provider thanks!

## 2014-03-29 NOTE — Telephone Encounter (Signed)
Please refill x 1 cpx is due 

## 2014-03-29 NOTE — Telephone Encounter (Signed)
Rx refilled.

## 2014-04-04 ENCOUNTER — Other Ambulatory Visit: Payer: Self-pay

## 2014-04-04 DIAGNOSIS — T82898D Other specified complication of vascular prosthetic devices, implants and grafts, subsequent encounter: Secondary | ICD-10-CM

## 2014-04-05 ENCOUNTER — Encounter: Payer: Self-pay | Admitting: Surgery

## 2014-04-08 ENCOUNTER — Other Ambulatory Visit (HOSPITAL_COMMUNITY): Payer: Medicare Other

## 2014-04-08 ENCOUNTER — Ambulatory Visit: Payer: Medicare Other | Admitting: Surgery

## 2014-04-09 LAB — PROTIME-INR: INR: 2.2 — AB (ref 0.9–1.1)

## 2014-04-10 ENCOUNTER — Other Ambulatory Visit: Payer: Self-pay | Admitting: Surgery

## 2014-04-10 ENCOUNTER — Ambulatory Visit (HOSPITAL_COMMUNITY)
Admission: RE | Admit: 2014-04-10 | Discharge: 2014-04-10 | Disposition: A | Discharge status: Home / Self Care | Payer: Medicare Other | Source: Ambulatory Visit | Attending: Surgery | Admitting: Surgery

## 2014-04-10 DIAGNOSIS — X58XXXA Exposure to other specified factors, initial encounter: Secondary | ICD-10-CM | POA: Insufficient documentation

## 2014-04-10 DIAGNOSIS — T82898D Other specified complication of vascular prosthetic devices, implants and grafts, subsequent encounter: Secondary | ICD-10-CM | POA: Diagnosis present

## 2014-04-11 ENCOUNTER — Ambulatory Visit (INDEPENDENT_AMBULATORY_CARE_PROVIDER_SITE_OTHER): Payer: Medicare Other | Admitting: Pharmacist

## 2014-04-11 DIAGNOSIS — I4891 Unspecified atrial fibrillation: Secondary | ICD-10-CM

## 2014-04-11 DIAGNOSIS — Z7901 Long term (current) use of anticoagulants: Secondary | ICD-10-CM

## 2014-04-12 ENCOUNTER — Encounter: Payer: Self-pay | Admitting: Surgery

## 2014-04-15 ENCOUNTER — Encounter: Payer: Self-pay | Admitting: Surgery

## 2014-04-15 ENCOUNTER — Ambulatory Visit (INDEPENDENT_AMBULATORY_CARE_PROVIDER_SITE_OTHER): Payer: Medicare Other | Admitting: Surgery

## 2014-04-15 VITALS — BP 162/79 | HR 100 | Ht 73.0 in | Wt 231.0 lb

## 2014-04-15 DIAGNOSIS — T829XXA Unspecified complication of cardiac and vascular prosthetic device, implant and graft, initial encounter: Secondary | ICD-10-CM | POA: Insufficient documentation

## 2014-04-15 DIAGNOSIS — N186 End stage renal disease: Secondary | ICD-10-CM | POA: Diagnosis not present

## 2014-04-15 NOTE — Progress Notes (Signed)
HISTORY AND PHYSICAL     CC:  Steal symptoms of left hand Referring Provider:  Renato Shin, MD  HPI: This is a 63 y.o. male who presents today with c/o numbness of his left hand that has been present for about 5 months.  He states that for the past month and a half, he get a stabbing pain in his left hand after about 3 hours of HD and has to have them stop his treatment.  This has worsened over the past month.  His left basilic vein transposition was placed in 2011 by Dr. Kellie Simmering.  He does not have any trouble dialyzing via his fistula.    The pt states that he had an infection under the nail of the left long finger and had antibiotics and this did not heal and progressively worsened.  Dr. Sharol Given performed a left long finger ray amputation on 02/13/14.  This has healed well.   He does have neuropathy from his diabetes.  He is not on a statin for his cholesterol.  He is on insulin for his DM.    He is on HD and dialyzes T/T/S. He is right handed.   Past Medical History  Diagnosis Date  . GERD (gastroesophageal reflux disease)   . Hypertension   . Neuromuscular disorder     neurophathy  . Atrial fibrillation     chronically anticoagulated with coumadin  . Hyperlipidemia   . Atrial flutter 07/07/2009    s/p ablation  . GOUT 08/01/2008  . ERECTILE DYSFUNCTION 08/01/2008  . PERIPHERAL NEUROPATHY 08/01/2008  . CORONARY ARTERY DISEASE 08/01/2008    non obstructive per cath 1/10; Dr. Olevia Perches  . 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 Gastroparesis 08/01/2008  . NEPHROLITHIASIS, HX OF 08/01/2008  . Chronic systolic heart failure 09/15/3152  . COPD 07/21/2010  . Diverticulosis of colon (without mention of hemorrhage) 10/28/2010  . Benign neoplasm of colon 10/28/2010  . Hemorrhoids, internal 10/28/2010  . Second degree Mobitz I AV block     asymptomatic  . Cyst     "I've had 4-5; pretty much all of them have got infected" (06/13/2013)  . Muscle spasm of back     back muscle  spasms and pt is unable to lay flat   . Abscess of back     lower middle portion of back  . Peripheral vascular disease     poor circulation  . Pneumonia     "first time I've been dx'd was today" (06/13/2013)  . Shortness of breath     "just related to this pneumonia" (06/13/2013)  . Type II diabetes mellitus dx'd 1980's  . Migraines     "none since the 1980's" (06/13/2013)  . End stage renal failure on dialysis 08/01/2008    "[place]; TTS" (06/13/2013)  . Renal cyst     bilateral  . Basal cell carcinoma     nose  . Depression     situaltional     Past Surgical History  Procedure Laterality Date  . Atrial ablation surgery  07-2008, 07/08/09    Dr Rayann Heman  . Foot surgery Right 09-2009    infection got into the bone; had to put rod in to hold foot together" (06/13/2013)  . Colonoscopy  2011?    Dr.Orr  . Av fistula placement Left 2010    upper arm  . Elbow fracture surgery Right 1974  . Pilonidal cyst excision  1971  . Cataract extraction Right ~  2012  . I&d extremity Left 12/29/2012    Procedure: IRRIGATION AND DEBRIDEMENT EXTREMITY;  Surgeon: Newt Minion, MD;  Location: [facility];  Service: Orthopedics;  Laterality: Left;  Irrigation and Debridement Left Leg, VAC, [place]in  . Toe amputation Right 01/18/2013    "little toe" Dr Sharol Given  . I&d extremity Bilateral 01/18/2013    Procedure: IRRIGATION AND DEBRIDEMENT EXTREMITYLEFT LEG AND RIGHT FOOT;  Surgeon: Newt Minion, MD;  Location: [facility];  Service: Orthopedics;  Laterality: Bilateral;  Left Leg Excisional Debridement  . Skin graft Left     left leg within the past month  . Amputation Left 02/02/2013    Procedure: AMPUTATION BELOW KNEE;  Surgeon: Newt Minion, MD;  Location: [facility];  Service: Orthopedics;  Laterality: Left;  Left Below Knee Amputation  . Fracture surgery    . Vasectomy    . Amputation Left 03/23/2013    Procedure: REVISION OF BELOW THE KNEE  AMPUTATION ;  Surgeon: Newt Minion, MD;  Location: [facility] [place];  Service:  Orthopedics;  Laterality: Left;  Left Below Knee Amputation Revision  . I&d extremity Left 05/04/2013    Procedure: Irrigation and Debridement Left Below Knee Ampuation;  Surgeon: Newt Minion, MD;  Location: [place];  Service: Orthopedics;  Laterality: Left;  Irrigation and Debridement Left Below Knee Ampuation  . Amputation Left 05/30/2013    Procedure: REVISION AMPUTATION BELOW KNEE;  Surgeon: Newt Minion, MD;  Location: [facility];  Service: Orthopedics;  Laterality: Left;  Revision Left Below Knee Amputation, Possible Above Knee Amputation  . Tonsillectomy    . Amputation Left 02/13/2014    Procedure: Left Long Finger Amputation PIP Joint;  Surgeon: Newt Minion, MD;  Location: [place];  Service: Orthopedics;  Laterality: Left;    No Known Allergies  Current Outpatient Prescriptions  Medication Sig Dispense Refill  . albuterol (PROVENTIL HFA;VENTOLIN HFA) 108 (90 BASE) MCG/ACT inhaler Inhale 2 puffs into the lungs every 4 (four) hours as needed for wheezing or shortness of breath. 1 Inhaler 2  . calcium acetate (PHOSLO) 667 MG capsule Take 667 mg by mouth 3 (three) times daily with meals.    Marland Kitchen doxycycline (VIBRA-TABS) 100 MG tablet Take 100 mg by mouth daily.    Marland Kitchen ethyl chloride spray Apply 1 application topically daily as needed. For pain    . folic acid-vitamin b complex-vitamin c-selenium-zinc (DIALYVITE) 3 MG TABS tablet Take 1 tablet by mouth daily.    Marland Kitchen FOSRENOL 1000 MG chewable tablet Chew 2 tablets by mouth 3 (three) times daily with meals.     . gabapentin (NEURONTIN) 300 MG capsule Take 300 mg by mouth at bedtime.     Marland Kitchen HYDROcodone-acetaminophen (NORCO) 5-325 MG per tablet Take 1 tablet by mouth every 6 (six) hours as needed. 30 tablet 0  . HYDROcodone-acetaminophen (NORCO/VICODIN) 5-325 MG per tablet Take 1 tablet by mouth every 4 (four) hours as needed for pain.    Marland Kitchen insulin aspart (NOVOLOG) 100 UNIT/ML injection Inject 30 Units into the skin 3 (three) times daily before meals. 10 mL  0  . loperamide (IMODIUM) 2 MG capsule Take 2 mg by mouth daily as needed for diarrhea or loose stools.     Marland Kitchen omeprazole (PRILOSEC) 20 MG capsule TAKE 1 CAPSULE BY MOUTH ONCE DAILY 90 capsule 0  . omeprazole (PRILOSEC) 20 MG capsule Take 1 capsule (20 mg total) by mouth daily. 30 capsule 0  . temazepam (RESTORIL) 30 MG capsule Take 30  mg by mouth at bedtime.    Marland Kitchen tiotropium (SPIRIVA) 18 MCG inhalation capsule Place 1 capsule (18 mcg total) into inhaler and inhale daily. 30 capsule 12  . warfarin (COUMADIN) 5 MG tablet Take 5 mg by mouth daily.     No current facility-administered medications for this visit.    Family History  Problem Relation Age of Onset  . Diabetes Mother   . Diabetes Maternal Grandmother   . Diabetes Paternal Grandmother   . Cancer Neg Hx     no FH of Colon Cancer    History   Social History  . Marital Status: Married    Spouse Name: N/A    Number of Children: 3  . Years of Education: N/A   Occupational History  . Sales Rep    Social History Main Topics  . Smoking status: Former Smoker -- 0.50 packs/day for 44 years    Types: Cigarettes    Quit date: 07/17/2013  . Smokeless tobacco: Never Used  . Alcohol Use: No     Comment: 06/13/2013 "no alcohol in 40 yrs"  . Drug Use: Yes    Special: Marijuana     Comment: 06/13/2013 "used marijuana years ago"  . Sexual Activity: Not Currently   Other Topics Concern  . 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[employer]           ROS: [x]  Positive   [ ]  Negative   [ ]  All sytems reviewed and are negative  Cardiovascular: []  chest pain/pressure []  palpitations [x]  hx PAT since age 51 [x]  hx of Afib on coumadin  [x]  pain and numbness in left hand (see HPI) []  SOB lying flat []  DOE []  pain in legs while walking []  pain in feet when lying flat []  hx of DVT []  hx of phlebitis []  swelling in legs []  varicose veins  Pulmonary: []  productive cough []  asthma []   wheezing  Neurologic: []  weakness in []  arms []  legs []  numbness in []  arms []  legs [] difficulty speaking or slurred speech []  temporary loss of vision in one eye []  dizziness  Hematologic: []  bleeding problems []  problems with blood clotting easily  GI []  vomiting blood []  blood in stool [x]  GERD  GU: []  burning with urination []  blood in urine [x]  continues to make urine [x]  ESRD on HD T/T/S  Psychiatric: []  hx of major depression  Integumentary: []  rashes []  ulcers  Constitutional: []  fever []  chills   PHYSICAL EXAMINATION:  Filed Vitals:   04/15/14 1212  BP: 162/79  Pulse: 100   Body mass index is 30.48 kg/(m^2).  General:  WDWN in NAD Gait: walks with walker HENT: WNL, normocephalic Pulmonary: normal non-labored breathing , without Rales, rhonchi,  wheezing Cardiac: RRR, without  Murmurs, rubs or gallops; Abdomen: obese Skin: without rashes, with ulcers on left hand. Vascular Exam/Pulses:  Right Left  Radial 1+ (weak) + biphasic doppler signal; pulse is palpable when fistula is compressed.  Ulnar Unable to palpate  +biphasic doppler signal  + doppler signal in the palmer arch.  Extremities: without ischemic changes, without Gangrene , without cellulitis; withseveral open wounds to left hand.  Grip is strong and equal bilaterally  Musculoskeletal: no muscle wasting or atrophy  Neurologic: A&O X 3; Appropriate Affect ; SENSATION: normal; MOTOR FUNCTION:  moving all extremities equally. Speech is fluent/normal   Non-Invasive Vascular Imaging:   none  Pt meds includes: Statin:  No. Beta Blocker:  No. Aspirin:  No. ACEI:  No. ARB:  No. Other Antiplatelet/Anticoagulant:  Yes.   coumadin    ASSESSMENT/PLAN:: 63 y.o. male s/p left basilic vein transposition March 2011 now with steal symptoms.   -pt describes classic steal symptoms that started ~ 5 months ago and has progressively worsened over the past 1.5 months.  He recently had his left  long finger amputated by Dr. Sharol Given and this has healed.  He has a significantly stronger radial pulse with compression of the fistula.  -Dr. Trula Slade discussed options with pt to address his steal sx.  He opted for banding of his fistula, which we will proceed with on November 20th.  The pt understands that this is not always effective and can sometimes clot off the fistula and is willing to proceed.  He understands that if this does not work, he will possibly need new access as well as a diatek catheter. -all his questions are answered.   Leontine Locket, PA-C [facility] [phone]  Clinic MD:  Pt seen and examined in conjunction with Dr. Trula Slade  I agree with the above.  I have seen and examined the patient.  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 Kellie Simmering.  He has undergone amputation of one of his fingers because of infection that he was not able to heal.  Clinically he has steal syndrome as his radial pulse became much more prominent with compression of his fistula.  I offered the patient 3 options.  #1 was a DRIL procedure which I told him was probably not the best option given the fact that his fistula has been in for 5 years and has become somewhat aneurysmal.  Option #2 was ligation of his fistula.  Option #3 is banding of the anastomosis.  The patient would like to proceed with banding.  I discussed the risks and benefits which include potential thrombosis of the fistula.  He could also not receive any benefit.  He understands this and wishes to proceed.  He is scheduled for Friday, November 20.  He is on Coumadin for atrial fibrillation.  I'll discuss this with Dr. Rayann Heman regarding bridging versus just stopping.

## 2014-04-17 ENCOUNTER — Other Ambulatory Visit: Payer: Self-pay

## 2014-04-17 ENCOUNTER — Telehealth: Payer: Self-pay | Admitting: Internal Medicine

## 2014-04-17 NOTE — Telephone Encounter (Signed)
New message    Pt is having a procedure on 05-03-14.  He is on coumadin.  Can he stop coumadin 5 days prior to surgery or be bridged on lovenox?

## 2014-04-17 NOTE — Telephone Encounter (Signed)
Stephanie at Dr. Vito Berger office  Called to see if pt can be off Coumadin for 5 days prior banding of left arm fistula. Pt is still in A-Fib. Dr. Desmond Dike would like to know if pt needs to be bridge with Lovenox. Colletta Maryland is aware that this message will be send to Dr. Rayann Heman for recommendations.

## 2014-04-19 ENCOUNTER — Telehealth: Payer: Self-pay | Admitting: *Deleted

## 2014-04-19 NOTE — Telephone Encounter (Signed)
Follow up    Checking on the status of coumadin.  Does patient need to be bridge with Lovenox. Procedure on  11/20.

## 2014-04-19 NOTE — Telephone Encounter (Signed)
INR on 04/19/2014 is 2.35 Pt instructed to continue on same dose and that we will check INR on November 10th as scheduled  and at that time will discuss cardiac clearance for procedure on the 20th and he states understadning

## 2014-04-19 NOTE — Telephone Encounter (Signed)
Left message for Colletta Maryland that Dr Rayann Heman out until next week

## 2014-04-19 NOTE — Telephone Encounter (Signed)
Gay Filler,  I am out this week,  Please use our new bridging protocol to advise.  Thanks! Jeneen Rinks

## 2014-04-22 NOTE — Telephone Encounter (Signed)
Pt has a CHADS score of 3 (CHF, HTN, DM).  Based on protocol, pt will not need Lovenox bridge.  Will send message back to Dr. Stephens Shire office.

## 2014-04-23 LAB — PROTIME-INR: INR: 2.3 — AB (ref 0.9–1.1)

## 2014-04-24 ENCOUNTER — Ambulatory Visit (INDEPENDENT_AMBULATORY_CARE_PROVIDER_SITE_OTHER): Payer: Medicare Other | Admitting: Cardiovascular Disease

## 2014-04-24 DIAGNOSIS — Z7901 Long term (current) use of anticoagulants: Secondary | ICD-10-CM

## 2014-04-24 DIAGNOSIS — I4891 Unspecified atrial fibrillation: Secondary | ICD-10-CM

## 2014-04-30 ENCOUNTER — Other Ambulatory Visit: Payer: Self-pay | Admitting: Endocrinology

## 2014-05-02 ENCOUNTER — Encounter (HOSPITAL_COMMUNITY): Payer: Self-pay | Admitting: *Deleted

## 2014-05-02 MED ORDER — CEFUROXIME SODIUM 1.5 G IJ SOLR
1.5000 g | INTRAMUSCULAR | Status: AC
  Administered 2014-05-03: 1.5 g via INTRAVENOUS
  Filled 2014-05-02: qty 1.5

## 2014-05-02 MED ORDER — CHLORHEXIDINE GLUCONATE CLOTH 2 % EX PADS: 6.0000 | MEDICATED_PAD | Freq: Once | CUTANEOUS | Status: DC

## 2014-05-02 MED ORDER — SODIUM CHLORIDE 0.9 % IV SOLN
INTRAVENOUS | Status: DC
  Administered 2014-05-03: 35 mL/h via INTRAVENOUS
  Administered 2014-05-03: 15:00:00 via INTRAVENOUS

## 2014-05-02 NOTE — Progress Notes (Signed)
   05/02/14 1621  OBSTRUCTIVE SLEEP APNEA  Have you ever been diagnosed with sleep apnea through a sleep study? No  Do you snore loudly (loud enough to be heard through closed doors)?  0  Do you often feel tired, fatigued, or sleepy during the daytime? 1  Has anyone observed you stop breathing during your sleep? 0  Do you have, or are you being treated for high blood pressure? 1  BMI more than 35 kg/m2? 0  Age over 63 years old? 1  Gender: 1  Obstructive Sleep Apnea Score 4

## 2014-05-02 NOTE — Progress Notes (Signed)
Pt stated that he took his last dose of Coumadin on Saturday as instructed by medical staff.

## 2014-05-03 ENCOUNTER — Ambulatory Visit (HOSPITAL_COMMUNITY)
Admission: RE | Admit: 2014-05-03 | Discharge: 2014-05-03 | Disposition: A | Discharge status: Home / Self Care | Payer: Medicare Other | Source: Ambulatory Visit | Attending: Surgery | Admitting: Surgery

## 2014-05-03 ENCOUNTER — Ambulatory Visit (HOSPITAL_COMMUNITY): Payer: Medicare Other | Admitting: Certified Registered"

## 2014-05-03 ENCOUNTER — Encounter (HOSPITAL_COMMUNITY): Payer: Self-pay | Admitting: *Deleted

## 2014-05-03 ENCOUNTER — Encounter (HOSPITAL_COMMUNITY)
Admission: RE | Disposition: A | Discharge status: Home / Self Care | Payer: Self-pay | Source: Ambulatory Visit | Attending: Surgery

## 2014-05-03 ENCOUNTER — Telehealth: Payer: Self-pay | Admitting: Surgery

## 2014-05-03 DIAGNOSIS — I4891 Unspecified atrial fibrillation: Secondary | ICD-10-CM | POA: Insufficient documentation

## 2014-05-03 DIAGNOSIS — I739 Peripheral vascular disease, unspecified: Secondary | ICD-10-CM | POA: Diagnosis not present

## 2014-05-03 DIAGNOSIS — T829XXA Unspecified complication of cardiac and vascular prosthetic device, implant and graft, initial encounter: Secondary | ICD-10-CM | POA: Diagnosis present

## 2014-05-03 DIAGNOSIS — Z7901 Long term (current) use of anticoagulants: Secondary | ICD-10-CM | POA: Diagnosis not present

## 2014-05-03 DIAGNOSIS — I120 Hypertensive chronic kidney disease with stage 5 chronic kidney disease or end stage renal disease: Secondary | ICD-10-CM | POA: Diagnosis not present

## 2014-05-03 DIAGNOSIS — Z794 Long term (current) use of insulin: Secondary | ICD-10-CM | POA: Insufficient documentation

## 2014-05-03 DIAGNOSIS — N186 End stage renal disease: Secondary | ICD-10-CM | POA: Diagnosis not present

## 2014-05-03 DIAGNOSIS — I2510 Atherosclerotic heart disease of native coronary artery without angina pectoris: Secondary | ICD-10-CM | POA: Insufficient documentation

## 2014-05-03 DIAGNOSIS — M109 Gout, unspecified: Secondary | ICD-10-CM | POA: Insufficient documentation

## 2014-05-03 DIAGNOSIS — F329 Major depressive disorder, single episode, unspecified: Secondary | ICD-10-CM | POA: Diagnosis not present

## 2014-05-03 DIAGNOSIS — Z87891 Personal history of nicotine dependence: Secondary | ICD-10-CM | POA: Diagnosis not present

## 2014-05-03 DIAGNOSIS — I10 Essential (primary) hypertension: Secondary | ICD-10-CM | POA: Diagnosis not present

## 2014-05-03 DIAGNOSIS — Z992 Dependence on renal dialysis: Secondary | ICD-10-CM | POA: Diagnosis not present

## 2014-05-03 DIAGNOSIS — I5022 Chronic systolic (congestive) heart failure: Secondary | ICD-10-CM | POA: Insufficient documentation

## 2014-05-03 DIAGNOSIS — T82898A Other specified complication of vascular prosthetic devices, implants and grafts, initial encounter: Secondary | ICD-10-CM

## 2014-05-03 DIAGNOSIS — Z79899 Other long term (current) drug therapy: Secondary | ICD-10-CM | POA: Insufficient documentation

## 2014-05-03 DIAGNOSIS — E1140 Type 2 diabetes mellitus with diabetic neuropathy, unspecified: Secondary | ICD-10-CM | POA: Insufficient documentation

## 2014-05-03 DIAGNOSIS — N529 Male erectile dysfunction, unspecified: Secondary | ICD-10-CM | POA: Diagnosis not present

## 2014-05-03 DIAGNOSIS — K219 Gastro-esophageal reflux disease without esophagitis: Secondary | ICD-10-CM | POA: Diagnosis not present

## 2014-05-03 DIAGNOSIS — D649 Anemia, unspecified: Secondary | ICD-10-CM | POA: Insufficient documentation

## 2014-05-03 DIAGNOSIS — J449 Chronic obstructive pulmonary disease, unspecified: Secondary | ICD-10-CM | POA: Diagnosis not present

## 2014-05-03 DIAGNOSIS — E785 Hyperlipidemia, unspecified: Secondary | ICD-10-CM | POA: Diagnosis not present

## 2014-05-03 HISTORY — PX: REVISON OF ARTERIOVENOUS FISTULA: SHX6074

## 2014-05-03 LAB — PROTIME-INR
INR: 1.26 (ref 0.00–1.49)
Prothrombin Time: 15.9 seconds — ABNORMAL HIGH (ref 11.6–15.2)

## 2014-05-03 LAB — POCT I-STAT 4, (NA,K, GLUC, HGB,HCT)
GLUCOSE: 212 mg/dL — AB (ref 70–99)
HEMATOCRIT: 39 % (ref 39.0–52.0)
Hemoglobin: 13.3 g/dL (ref 13.0–17.0)
POTASSIUM: 4.6 meq/L (ref 3.7–5.3)
SODIUM: 136 meq/L — AB (ref 137–147)

## 2014-05-03 LAB — GLUCOSE, CAPILLARY
GLUCOSE-CAPILLARY: 209 mg/dL — AB (ref 70–99)
Glucose-Capillary: 170 mg/dL — ABNORMAL HIGH (ref 70–99)

## 2014-05-03 LAB — APTT: aPTT: 34 seconds (ref 24–37)

## 2014-05-03 SURGERY — REVISON OF ARTERIOVENOUS FISTULA
Anesthesia: General | Site: Arm Upper | Laterality: Left

## 2014-05-03 MED ORDER — MIDAZOLAM HCL 5 MG/5ML IJ SOLN
INTRAMUSCULAR | Status: DC | PRN
  Administered 2014-05-03: 2 mg via INTRAVENOUS

## 2014-05-03 MED ORDER — SUCCINYLCHOLINE CHLORIDE 20 MG/ML IJ SOLN
INTRAMUSCULAR | Status: DC | PRN
  Administered 2014-05-03: 100 mg via INTRAVENOUS

## 2014-05-03 MED ORDER — HYDROMORPHONE HCL 1 MG/ML IJ SOLN
INTRAMUSCULAR | Status: AC
  Filled 2014-05-03: qty 1

## 2014-05-03 MED ORDER — ONDANSETRON HCL 4 MG/2ML IJ SOLN
INTRAMUSCULAR | Status: AC
  Filled 2014-05-03: qty 2

## 2014-05-03 MED ORDER — MEPERIDINE HCL 25 MG/ML IJ SOLN: 6.2500 mg | INTRAMUSCULAR | Status: DC | PRN

## 2014-05-03 MED ORDER — FENTANYL CITRATE 0.05 MG/ML IJ SOLN
INTRAMUSCULAR | Status: AC
  Filled 2014-05-03: qty 5

## 2014-05-03 MED ORDER — HYDROMORPHONE HCL 1 MG/ML IJ SOLN
0.2500 mg | INTRAMUSCULAR | Status: DC | PRN
  Administered 2014-05-03 (×3): 0.5 mg via INTRAVENOUS

## 2014-05-03 MED ORDER — LIDOCAINE HCL (CARDIAC) 20 MG/ML IV SOLN
INTRAVENOUS | Status: DC | PRN
  Administered 2014-05-03: 100 mg via INTRAVENOUS

## 2014-05-03 MED ORDER — OXYCODONE HCL 5 MG PO TABS
ORAL_TABLET | ORAL | Status: AC
  Filled 2014-05-03: qty 1

## 2014-05-03 MED ORDER — HYDROCODONE-ACETAMINOPHEN 5-325 MG PO TABS: 1.0000 | ORAL_TABLET | Freq: Four times a day (QID) | ORAL | Status: AC | PRN

## 2014-05-03 MED ORDER — OXYCODONE HCL 5 MG/5ML PO SOLN: 5.0000 mg | Freq: Once | ORAL | Status: AC | PRN

## 2014-05-03 MED ORDER — ALBUTEROL SULFATE HFA 108 (90 BASE) MCG/ACT IN AERS
INHALATION_SPRAY | RESPIRATORY_TRACT | Status: DC | PRN
  Administered 2014-05-03: 4 via RESPIRATORY_TRACT

## 2014-05-03 MED ORDER — ONDANSETRON HCL 4 MG/2ML IJ SOLN
INTRAMUSCULAR | Status: DC | PRN
  Administered 2014-05-03: 4 mg via INTRAVENOUS

## 2014-05-03 MED ORDER — HYDROCODONE-ACETAMINOPHEN 5-325 MG PO TABS: 1.0000 | ORAL_TABLET | Freq: Four times a day (QID) | ORAL | Status: DC | PRN

## 2014-05-03 MED ORDER — OXYCODONE HCL 5 MG PO TABS
5.0000 mg | ORAL_TABLET | Freq: Once | ORAL | Status: AC | PRN
  Administered 2014-05-03: 5 mg via ORAL

## 2014-05-03 MED ORDER — PHENYLEPHRINE HCL 10 MG/ML IJ SOLN
INTRAMUSCULAR | Status: AC
  Filled 2014-05-03: qty 1

## 2014-05-03 MED ORDER — PROPOFOL 10 MG/ML IV BOLUS
INTRAVENOUS | Status: DC | PRN
  Administered 2014-05-03: 100 mg via INTRAVENOUS
  Administered 2014-05-03 (×2): 50 mg via INTRAVENOUS
  Administered 2014-05-03: 200 mg via INTRAVENOUS

## 2014-05-03 MED ORDER — HEMOSTATIC AGENTS (NO CHARGE) OPTIME
TOPICAL | Status: DC | PRN
  Administered 2014-05-03: 1 via TOPICAL

## 2014-05-03 MED ORDER — PROTAMINE SULFATE 10 MG/ML IV SOLN
INTRAVENOUS | Status: AC
  Filled 2014-05-03: qty 5

## 2014-05-03 MED ORDER — SODIUM CHLORIDE 0.9 % IR SOLN
Status: DC | PRN
  Administered 2014-05-03: 500 mL

## 2014-05-03 MED ORDER — PHENYLEPHRINE 40 MCG/ML (10ML) SYRINGE FOR IV PUSH (FOR BLOOD PRESSURE SUPPORT)
PREFILLED_SYRINGE | INTRAVENOUS | Status: AC
  Filled 2014-05-03: qty 10

## 2014-05-03 MED ORDER — MIDAZOLAM HCL 2 MG/2ML IJ SOLN
INTRAMUSCULAR | Status: AC
  Filled 2014-05-03: qty 2

## 2014-05-03 MED ORDER — PROPOFOL 10 MG/ML IV BOLUS
INTRAVENOUS | Status: AC
  Filled 2014-05-03: qty 20

## 2014-05-03 MED ORDER — PHENYLEPHRINE HCL 10 MG/ML IJ SOLN
INTRAMUSCULAR | Status: DC | PRN
  Administered 2014-05-03 (×2): 80 ug via INTRAVENOUS
  Administered 2014-05-03 (×4): 40 ug via INTRAVENOUS

## 2014-05-03 MED ORDER — FENTANYL CITRATE 0.05 MG/ML IJ SOLN
INTRAMUSCULAR | Status: DC | PRN
  Administered 2014-05-03: 150 ug via INTRAVENOUS
  Administered 2014-05-03 (×2): 50 ug via INTRAVENOUS

## 2014-05-03 MED ORDER — PROTAMINE SULFATE 10 MG/ML IV SOLN
INTRAVENOUS | Status: DC | PRN
  Administered 2014-05-03 (×5): 10 mg via INTRAVENOUS

## 2014-05-03 MED ORDER — ONDANSETRON HCL 4 MG/2ML IJ SOLN: 4.0000 mg | Freq: Once | INTRAMUSCULAR | Status: DC | PRN

## 2014-05-03 MED ORDER — 0.9 % SODIUM CHLORIDE (POUR BTL) OPTIME
TOPICAL | Status: DC | PRN
  Administered 2014-05-03: 1000 mL

## 2014-05-03 MED ORDER — ESMOLOL HCL 10 MG/ML IV SOLN
INTRAVENOUS | Status: DC | PRN
  Administered 2014-05-03: 10 mg via INTRAVENOUS

## 2014-05-03 MED ORDER — HEPARIN SODIUM (PORCINE) 1000 UNIT/ML IJ SOLN
INTRAMUSCULAR | Status: DC | PRN
  Administered 2014-05-03: 2000 [IU] via INTRAVENOUS
  Administered 2014-05-03: 4000 [IU] via INTRAVENOUS

## 2014-05-03 MED ORDER — ESMOLOL HCL 10 MG/ML IV SOLN
INTRAVENOUS | Status: AC
  Filled 2014-05-03: qty 10

## 2014-05-03 MED ORDER — EPHEDRINE SULFATE 50 MG/ML IJ SOLN
INTRAMUSCULAR | Status: DC | PRN
  Administered 2014-05-03 (×2): 10 mg via INTRAVENOUS
  Administered 2014-05-03: 5 mg via INTRAVENOUS

## 2014-05-03 SURGICAL SUPPLY — 46 items
ADH SKN CLS APL DERMABOND .7 (GAUZE/BANDAGES/DRESSINGS)
BLADE SURG 10 STRL SS (BLADE) ×4 IMPLANT
CANISTER SUCTION 2500CC (MISCELLANEOUS) ×4 IMPLANT
CLIP TI MEDIUM 6 (CLIP) ×4 IMPLANT
CLIP TI WIDE RED SMALL 6 (CLIP) ×7 IMPLANT
COVER SURGICAL LIGHT HANDLE (MISCELLANEOUS) ×4 IMPLANT
COVER TRANSDUCER ULTRASND GEL (DRAPE) ×3 IMPLANT
DERMABOND ADVANCED (GAUZE/BANDAGES/DRESSINGS)
DERMABOND ADVANCED .7 DNX12 (GAUZE/BANDAGES/DRESSINGS) ×1 IMPLANT
ELECT REM PT RETURN 9FT ADLT (ELECTROSURGICAL) ×4
ELECTRODE REM PT RTRN 9FT ADLT (ELECTROSURGICAL) ×2 IMPLANT
GEL ULTRASOUND 20GR AQUASONIC (MISCELLANEOUS) IMPLANT
GLOVE BIOGEL PI IND STRL 6.5 (GLOVE) ×4 IMPLANT
GLOVE BIOGEL PI IND STRL 7.5 (GLOVE) ×2 IMPLANT
GLOVE BIOGEL PI INDICATOR 6.5 (GLOVE) ×8
GLOVE BIOGEL PI INDICATOR 7.5 (GLOVE) ×2
GLOVE ECLIPSE 6.5 STRL STRAW (GLOVE) ×3 IMPLANT
GLOVE SURG SS PI 7.0 STRL IVOR (GLOVE) ×9 IMPLANT
GLOVE SURG SS PI 7.5 STRL IVOR (GLOVE) ×4 IMPLANT
GOWN STRL REUS W/ TWL LRG LVL3 (GOWN DISPOSABLE) ×6 IMPLANT
GOWN STRL REUS W/ TWL XL LVL3 (GOWN DISPOSABLE) ×3 IMPLANT
GOWN STRL REUS W/TWL LRG LVL3 (GOWN DISPOSABLE) ×16
GOWN STRL REUS W/TWL XL LVL3 (GOWN DISPOSABLE) ×8
HEMOSTAT SNOW SURGICEL 2X4 (HEMOSTASIS) ×3 IMPLANT
KIT BASIN OR (CUSTOM PROCEDURE TRAY) ×4 IMPLANT
KIT ROOM TURNOVER OR (KITS) ×4 IMPLANT
MARKER SKIN DUAL TIP RULER LAB (MISCELLANEOUS) ×3 IMPLANT
NS IRRIG 1000ML POUR BTL (IV SOLUTION) ×4 IMPLANT
PACK CV ACCESS (CUSTOM PROCEDURE TRAY) ×4 IMPLANT
PAD ARMBOARD 7.5X6 YLW CONV (MISCELLANEOUS) ×8 IMPLANT
PATCH VASCULAR VASCU GUARD 1X6 (Vascular Products) ×3 IMPLANT
PROBE PENCIL 8 MHZ STRL DISP (MISCELLANEOUS) ×3 IMPLANT
SPONGE GAUZE 4X4 12PLY STER LF (GAUZE/BANDAGES/DRESSINGS) ×3 IMPLANT
SPONGE LAP 4X18 X RAY DECT (DISPOSABLE) ×3 IMPLANT
SUT ETHILON 3 0 PS 1 (SUTURE) ×3 IMPLANT
SUT PROLENE 5 0 C 1 24 (SUTURE) ×18 IMPLANT
SUT PROLENE 6 0 BV (SUTURE) ×9 IMPLANT
SUT SILK 0 TIES 10X30 (SUTURE) ×4 IMPLANT
SUT VIC AB 3-0 SH 27 (SUTURE) ×4
SUT VIC AB 3-0 SH 27X BRD (SUTURE) ×2 IMPLANT
SUT VICRYL 4-0 PS2 18IN ABS (SUTURE) IMPLANT
SWAB COLLECTION DEVICE MRSA (MISCELLANEOUS) IMPLANT
TAPE CLOTH SURG 4X10 WHT LF (GAUZE/BANDAGES/DRESSINGS) ×3 IMPLANT
TUBE ANAEROBIC SPECIMEN COL (MISCELLANEOUS) IMPLANT
UNDERPAD 30X30 INCONTINENT (UNDERPADS AND DIAPERS) ×4 IMPLANT
WATER STERILE IRR 1000ML POUR (IV SOLUTION) ×4 IMPLANT

## 2014-05-03 NOTE — Interval H&P Note (Signed)
History and Physical Interval Note:  05/03/2014 12:38 PM  [name]  has presented today for surgery, with the diagnosis of Left hand steal syndrome T82.510  The various methods of treatment have been discussed with the patient and family. After consideration of risks, benefits and other options for treatment, the patient has consented to  Procedure(s): BANDING OF ARTERIOVENOUS  FISTULA (Left) as a surgical intervention .  The patient's history has been reviewed, patient examined, no change in status, stable for surgery.  I have reviewed the patient's chart and labs.  Questions were answered to the patient's satisfaction.     Abdirahman Chittum IV, V. WELLS

## 2014-05-03 NOTE — Anesthesia Procedure Notes (Signed)
Procedure Name: Intubation Date/Time: 05/03/2014 1:15 PM Performed by: Julian Reil Pre-anesthesia Checklist: Patient identified, Emergency Drugs available, Suction available and Patient being monitored Patient Re-evaluated:Patient Re-evaluated prior to inductionOxygen Delivery Method: Circle system utilized Preoxygenation: Pre-oxygenation with 100% oxygen Intubation Type: IV induction Laryngoscope Size: Mac and 4 Grade View: Grade II Tube type: Oral Tube size: 7.5 mm Number of attempts: 1 Airway Equipment and Method: Stylet Placement Confirmation: ETT inserted through vocal cords under direct vision,  positive ETCO2 and breath sounds checked- equal and bilateral Secured at: 22 cm Tube secured with: Tape Dental Injury: Teeth and Oropharynx as per pre-operative assessment

## 2014-05-03 NOTE — H&P (View-Only) (Signed)
HISTORY AND PHYSICAL     CC:  Steal symptoms of left hand Referring Provider:  Renato Shin, MD  HPI: This is a 63 y.o. male who presents today with c/o numbness of his left hand that has been present for about 5 months.  He states that for the past month and a half, he get a stabbing pain in his left hand after about 3 hours of HD and has to have them stop his treatment.  This has worsened over the past month.  His left basilic vein transposition was placed in 2011 by Dr. Kellie Simmering.  He does not have any trouble dialyzing via his fistula.    The pt states that he had an infection under the nail of the left long finger and had antibiotics and this did not heal and progressively worsened.  Dr. Sharol Given performed a left long finger ray amputation on 02/13/14.  This has healed well.   He does have neuropathy from his diabetes.  He is not on a statin for his cholesterol.  He is on insulin for his DM.    He is on HD and dialyzes T/T/S. He is right handed.   Past Medical History  Diagnosis Date  . GERD (gastroesophageal reflux disease)   . Hypertension   . Neuromuscular disorder     neurophathy  . Atrial fibrillation     chronically anticoagulated with coumadin  . Hyperlipidemia   . Atrial flutter 07/07/2009    s/p ablation  . GOUT 08/01/2008  . ERECTILE DYSFUNCTION 08/01/2008  . PERIPHERAL NEUROPATHY 08/01/2008  . CORONARY ARTERY DISEASE 08/01/2008    non obstructive per cath 1/10; Dr. Olevia Perches  . 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 Gastroparesis 08/01/2008  . NEPHROLITHIASIS, HX OF 08/01/2008  . Chronic systolic heart failure 01/18/6810  . COPD 07/21/2010  . Diverticulosis of colon (without mention of hemorrhage) 10/28/2010  . Benign neoplasm of colon 10/28/2010  . Hemorrhoids, internal 10/28/2010  . Second degree Mobitz I AV block     asymptomatic  . Cyst     "I've had 4-5; pretty much all of them have got infected" (06/13/2013)  . Muscle spasm of back     back muscle  spasms and pt is unable to lay flat   . Abscess of back     lower middle portion of back  . Peripheral vascular disease     poor circulation  . Pneumonia     "first time I've been dx'd was today" (06/13/2013)  . Shortness of breath     "just related to this pneumonia" (06/13/2013)  . Type II diabetes mellitus dx'd 1980's  . Migraines     "none since the 1980's" (06/13/2013)  . End stage renal failure on dialysis 08/01/2008    "[place]; TTS" (06/13/2013)  . Renal cyst     bilateral  . Basal cell carcinoma     nose  . Depression     situaltional     Past Surgical History  Procedure Laterality Date  . Atrial ablation surgery  07-2008, 07/08/09    Dr Rayann Heman  . Foot surgery Right 09-2009    infection got into the bone; had to put rod in to hold foot together" (06/13/2013)  . Colonoscopy  2011?    Dr.Orr  . Av fistula placement Left 2010    upper arm  . Elbow fracture surgery Right 1974  . Pilonidal cyst excision  1971  . Cataract extraction Right ~  2012  . I&d extremity Left 12/29/2012    Procedure: IRRIGATION AND DEBRIDEMENT EXTREMITY;  Surgeon: Newt Minion, MD;  Location: [facility];  Service: Orthopedics;  Laterality: Left;  Irrigation and Debridement Left Leg, VAC, [place]  . Toe amputation Right 01/18/2013    "little toe" Dr Sharol Given  . I&d extremity Bilateral 01/18/2013    Procedure: IRRIGATION AND DEBRIDEMENT EXTREMITYLEFT LEG AND RIGHT FOOT;  Surgeon: Newt Minion, MD;  Location: [facility];  Service: Orthopedics;  Laterality: Bilateral;  Left Leg Excisional Debridement  . Skin graft Left     left leg within the past month  . Amputation Left 02/02/2013    Procedure: AMPUTATION BELOW KNEE;  Surgeon: Newt Minion, MD;  Location: [facility];  Service: Orthopedics;  Laterality: Left;  Left Below Knee Amputation  . Fracture surgery    . Vasectomy    . Amputation Left 03/23/2013    Procedure: REVISION OF BELOW THE KNEE  AMPUTATION ;  Surgeon: Newt Minion, MD;  Location: [facility];  Service:  Orthopedics;  Laterality: Left;  Left Below Knee Amputation Revision  . I&d extremity Left 05/04/2013    Procedure: Irrigation and Debridement Left Below Knee Ampuation;  Surgeon: Newt Minion, MD;  Location: [facility];  Service: Orthopedics;  Laterality: Left;  Irrigation and Debridement Left Below Knee Ampuation  . Amputation Left 05/30/2013    Procedure: REVISION AMPUTATION BELOW KNEE;  Surgeon: Newt Minion, MD;  Location: [facility];  Service: Orthopedics;  Laterality: Left;  Revision Left Below Knee Amputation, Possible Above Knee Amputation  . Tonsillectomy    . Amputation Left 02/13/2014    Procedure: Left Long Finger Amputation PIP Joint;  Surgeon: Newt Minion, MD;  Location: [facility];  Service: Orthopedics;  Laterality: Left;    No Known Allergies  Current Outpatient Prescriptions  Medication Sig Dispense Refill  . albuterol (PROVENTIL HFA;VENTOLIN HFA) 108 (90 BASE) MCG/ACT inhaler Inhale 2 puffs into the lungs every 4 (four) hours as needed for wheezing or shortness of breath. 1 Inhaler 2  . calcium acetate (PHOSLO) 667 MG capsule Take 667 mg by mouth 3 (three) times daily with meals.    Marland Kitchen doxycycline (VIBRA-TABS) 100 MG tablet Take 100 mg by mouth daily.    Marland Kitchen ethyl chloride spray Apply 1 application topically daily as needed. For pain    . folic acid-vitamin b complex-vitamin c-selenium-zinc (DIALYVITE) 3 MG TABS tablet Take 1 tablet by mouth daily.    Marland Kitchen FOSRENOL 1000 MG chewable tablet Chew 2 tablets by mouth 3 (three) times daily with meals.     . gabapentin (NEURONTIN) 300 MG capsule Take 300 mg by mouth at bedtime.     Marland Kitchen HYDROcodone-acetaminophen (NORCO) 5-325 MG per tablet Take 1 tablet by mouth every 6 (six) hours as needed. 30 tablet 0  . HYDROcodone-acetaminophen (NORCO/VICODIN) 5-325 MG per tablet Take 1 tablet by mouth every 4 (four) hours as needed for pain.    Marland Kitchen insulin aspart (NOVOLOG) 100 UNIT/ML injection Inject 30 Units into the skin 3 (three) times daily before meals. 10 mL  0  . loperamide (IMODIUM) 2 MG capsule Take 2 mg by mouth daily as needed for diarrhea or loose stools.     Marland Kitchen omeprazole (PRILOSEC) 20 MG capsule TAKE 1 CAPSULE BY MOUTH ONCE DAILY 90 capsule 0  . omeprazole (PRILOSEC) 20 MG capsule Take 1 capsule (20 mg total) by mouth daily. 30 capsule 0  . temazepam (RESTORIL) 30 MG capsule Take 30  mg by mouth at bedtime.    Marland Kitchen tiotropium (SPIRIVA) 18 MCG inhalation capsule Place 1 capsule (18 mcg total) into inhaler and inhale daily. 30 capsule 12  . warfarin (COUMADIN) 5 MG tablet Take 5 mg by mouth daily.     No current facility-administered medications for this visit.    Family History  Problem Relation Age of Onset  . Diabetes Mother   . Diabetes Maternal Grandmother   . Diabetes Paternal Grandmother   . Cancer Neg Hx     no FH of Colon Cancer    History   Social History  . Marital Status: Married    Spouse Name: N/A    Number of Children: 3  . Years of Education: N/A   Occupational History  . Sales Rep    Social History Main Topics  . Smoking status: Former Smoker -- 0.50 packs/day for 44 years    Types: Cigarettes    Quit date: 07/17/2013  . Smokeless tobacco: Never Used  . Alcohol Use: No     Comment: 06/13/2013 "no alcohol in 40 yrs"  . Drug Use: Yes    Special: Marijuana     Comment: 06/13/2013 "used marijuana years ago"  . Sexual Activity: Not Currently   Other Topics Concern  . 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[employer]           ROS: [x]  Positive   [ ]  Negative   [ ]  All sytems reviewed and are negative  Cardiovascular: []  chest pain/pressure []  palpitations [x]  hx PAT since age 86 [x]  hx of Afib on coumadin  [x]  pain and numbness in left hand (see HPI) []  SOB lying flat []  DOE []  pain in legs while walking []  pain in feet when lying flat []  hx of DVT []  hx of phlebitis []  swelling in legs []  varicose veins  Pulmonary: []  productive cough []  asthma []   wheezing  Neurologic: []  weakness in []  arms []  legs []  numbness in []  arms []  legs [] difficulty speaking or slurred speech []  temporary loss of vision in one eye []  dizziness  Hematologic: []  bleeding problems []  problems with blood clotting easily  GI []  vomiting blood []  blood in stool [x]  GERD  GU: []  burning with urination []  blood in urine [x]  continues to make urine [x]  ESRD on HD T/T/S  Psychiatric: []  hx of major depression  Integumentary: []  rashes []  ulcers  Constitutional: []  fever []  chills   PHYSICAL EXAMINATION:  Filed Vitals:   04/15/14 1212  BP: 162/79  Pulse: 100   Body mass index is 30.48 kg/(m^2).  General:  WDWN in NAD Gait: walks with walker HENT: WNL, normocephalic Pulmonary: normal non-labored breathing , without Rales, rhonchi,  wheezing Cardiac: RRR, without  Murmurs, rubs or gallops; Abdomen: obese Skin: without rashes, with ulcers on left hand. Vascular Exam/Pulses:  Right Left  Radial 1+ (weak) + biphasic doppler signal; pulse is palpable when fistula is compressed.  Ulnar Unable to palpate  +biphasic doppler signal  + doppler signal in the palmer arch.  Extremities: without ischemic changes, without Gangrene , without cellulitis; withseveral open wounds to left hand.  Grip is strong and equal bilaterally  Musculoskeletal: no muscle wasting or atrophy  Neurologic: A&O X 3; Appropriate Affect ; SENSATION: normal; MOTOR FUNCTION:  moving all extremities equally. Speech is fluent/normal   Non-Invasive Vascular Imaging:   none  Pt meds includes: Statin:  No. Beta Blocker:  No. Aspirin:  No. ACEI:  No. ARB:  No. Other Antiplatelet/Anticoagulant:  Yes.   coumadin    ASSESSMENT/PLAN:: 63 y.o. male s/p left basilic vein transposition March 2011 now with steal symptoms.   -pt describes classic steal symptoms that started ~ 5 months ago and has progressively worsened over the past 1.5 months.  He recently had his left  long finger amputated by Dr. Sharol Given and this has healed.  He has a significantly stronger radial pulse with compression of the fistula.  -Dr. Trula Slade discussed options with pt to address his steal sx.  He opted for banding of his fistula, which we will proceed with on November 20th.  The pt understands that this is not always effective and can sometimes clot off the fistula and is willing to proceed.  He understands that if this does not work, he will possibly need new access as well as a diatek catheter. -all his questions are answered.   Leontine Locket, PA-C [facility] [phone]  Clinic MD:  Pt seen and examined in conjunction with Dr. Trula Slade  I agree with the above.  I have seen and examined the patient.  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 Kellie Simmering.  He has undergone amputation of one of his fingers because of infection that he was not able to heal.  Clinically he has steal syndrome as his radial pulse became much more prominent with compression of his fistula.  I offered the patient 3 options.  #1 was a DRIL procedure which I told him was probably not the best option given the fact that his fistula has been in for 5 years and has become somewhat aneurysmal.  Option #2 was ligation of his fistula.  Option #3 is banding of the anastomosis.  The patient would like to proceed with banding.  I discussed the risks and benefits which include potential thrombosis of the fistula.  He could also not receive any benefit.  He understands this and wishes to proceed.  He is scheduled for Friday, November 20.  He is on Coumadin for atrial fibrillation.  I'll discuss this with Dr. Rayann Heman regarding bridging versus just stopping.

## 2014-05-03 NOTE — Progress Notes (Signed)
HD access record faxed to [facility]. Pt says he is sched. For HD tomorrow.

## 2014-05-03 NOTE — Telephone Encounter (Addendum)
-----   Message from Mena Goes, RN sent at 05/03/2014  3:19 PM EST ----- Regarding: Schedule   ----- Message -----    From: Alvia Grove, PA-C    Sent: 05/03/2014   1:42 PM      To: [facility] Charge Pool  S/p banding of left arm AVF 05/03/14  F/u with Dr. Trula Slade in 2 weeks.  Thanks, Kim  05/03/14: left msg for pt, dpm

## 2014-05-03 NOTE — Op Note (Signed)
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  In order to get an adequate repair I had to resect redundant vein after removal of the aneurysm.  I placed a bovine pericardial patch on the proximal basilic vein which narrowed the luminal diameter by approximately 50%.  The patient had a triphasic radial artery signal after the procedure.  Indications:  The patient has a left basilic vein transposition which has been functioning nicely.  He has recently developed pain in his hand as well as ulceration.  Imaging studies revealed a significant pressure gradient within the radial artery with the fistula compressed and patent.  Procedure:  The patient was identified in the holding area and taken to [facility] 12  The patient was then placed supine on the table. general anesthesia was administered.  The patient was prepped and draped in the usual sterile fashion.  A time out was called and antibiotics were administered.  Incision was made over the fistula near the antecubital crease.  I dissected out the proximal vein.  There was a significant aneurysmal degeneration slightly higher up on the vein I elected to fully dissect this out to facilitate repair.  Once I got adequate exposure and mobilization of the aneurysm, the patient was fully heparinized.  The fistula was occluded.  A #11 blade was used to make a venotomy which was extended longitudinally to open up the fistula.  I then resected the aneurysm which was coming off the  posterior medial aspect of the fistula.  After doing this, there was a significant redundancy within the vein.  Therefore I transected the vein and resected approximately 1-2 centimeters.  I then plicated the proximal portion of the fistula.  I then performed a end to end anastomosis with running 5-0 Prolene after removing the clamps, I was not satisfied with the signal within the fistula.  I was concerned that because of the calcified nature of the vein that this may have created a filling defect affecting outflow of the fistula.  Therefore elected to extend the incision slightly higher up the arm and dissected vein and this level.  I then took down the anastomosis between the 2 heads of the vein I transected another approximately 2 mm so that I had fresh edges and then I redid the anastomosis with running 5-0 Prolene.  After completing this anastomosis, there was excellent flow within the fistula.  I felt that I had already narrowed the vein down some but elected to take a piece of bovine pericardium and and the proximal portion of the vein so that it was narrowed down by approximately 50%.  At this point Doppler confirmed triphasic waveforms within the radial artery.  There is also an excellent thrill within the fistula.  50 mg of protamine was administered.  After hemostasis was satisfactory the deep tissue was reapproximated with 2 layers of 3-0 Vicryl.  Because of the proximity of the fistula to the skin as well as the condition of the skin where it had been accessed for dialysis, I elected to close  the skin with 3-0 nylon.  Sterile dressings were applied.  There were no immediate complications.   Disposition:  To PACU in stable condition.   Theotis Burrow, M.D. [facility] Office: [phone] Pager:  [phone]

## 2014-05-03 NOTE — Transfer of Care (Signed)
Immediate Anesthesia Transfer of Care Note  Patient: [name]  Procedure(s) Performed: Procedure(s): PLICATION, RESECTION & BANDING ,OF LEFT UPPER ARM BASILIC VEIN TRANSPOSITION (Left)  Patient Location: PACU  Anesthesia Type:General  Level of Consciousness: awake, alert , oriented and patient cooperative  Airway & Oxygen Therapy: Patient Spontanous Breathing and Patient connected to face mask oxygen  Post-op Assessment: Report given to PACU RN, Post -op Vital signs reviewed and stable and Patient moving all extremities  Post vital signs: Reviewed and stable  Complications: No apparent anesthesia complications

## 2014-05-03 NOTE — Anesthesia Preprocedure Evaluation (Addendum)
Anesthesia Evaluation  Patient identified by MRN, date of birth, ID band Patient awake    Reviewed: Allergy & Precautions, H&P , NPO status , Patient's Chart, lab work & pertinent test results, reviewed documented beta blocker date and time   Airway Mallampati: II  TM Distance: >3 FB Neck ROM: Full    Dental  (+) Poor Dentition, Dental Advisory Given   Pulmonary shortness of breath, COPDformer smoker,          Cardiovascular hypertension, Pt. on medications + CAD and + Peripheral Vascular Disease + dysrhythmias Atrial Fibrillation     Neuro/Psych  Headaches, Depression    GI/Hepatic GERD-  Controlled,  Endo/Other  diabetes, Poorly Controlled, Type 2, Insulin Dependent  Renal/GU Dialysis and ESRFRenal disease     Musculoskeletal   Abdominal   Peds  Hematology  (+) anemia ,   Anesthesia Other Findings   Reproductive/Obstetrics                           Anesthesia Physical Anesthesia Plan  ASA: III  Anesthesia Plan: General   Post-op Pain Management:    Induction: Intravenous  Airway Management Planned: Oral ETT  Additional Equipment:   Intra-op Plan:   Post-operative Plan: Extubation in OR  Informed Consent: I have reviewed the patients History and Physical, chart, labs and discussed the procedure including the risks, benefits and alternatives for the proposed anesthesia with the patient or authorized representative who has indicated his/her understanding and acceptance.   Dental advisory given  Plan Discussed with: Surgeon and CRNA  Anesthesia Plan Comments:        Anesthesia Quick Evaluation

## 2014-05-03 NOTE — Anesthesia Postprocedure Evaluation (Signed)
  Anesthesia Post-op Note  Patient: [name]  Procedure(s) Performed: Procedure(s): PLICATION, RESECTION & BANDING ,OF LEFT UPPER ARM BASILIC VEIN TRANSPOSITION (Left)  Patient Location: PACU  Anesthesia Type: No value filed.   Level of Consciousness: awake, alert  and oriented  Airway and Oxygen Therapy: Patient Spontanous Breathing  Post-op Pain: mild  Post-op Assessment: Post-op Vital signs reviewed  Post-op Vital Signs: Reviewed  Last Vitals:  Filed Vitals:   05/03/14 1728  BP:   Pulse: 88  Temp:   Resp: 15    Complications: No apparent anesthesia complications

## 2014-05-03 NOTE — Discharge Instructions (Signed)

## 2014-05-07 ENCOUNTER — Encounter (HOSPITAL_COMMUNITY): Payer: Self-pay | Admitting: Surgery

## 2014-05-08 ENCOUNTER — Encounter: Payer: Self-pay | Admitting: Surgery

## 2014-05-13 ENCOUNTER — Encounter: Payer: Medicare Other | Admitting: Surgery

## 2014-05-14 LAB — PROTIME-INR: INR: 1.8 — AB (ref 0.9–1.1)

## 2014-05-16 ENCOUNTER — Encounter: Payer: Self-pay | Admitting: Family

## 2014-05-17 ENCOUNTER — Ambulatory Visit (INDEPENDENT_AMBULATORY_CARE_PROVIDER_SITE_OTHER): Payer: Self-pay | Admitting: Family

## 2014-05-17 ENCOUNTER — Encounter: Payer: Self-pay | Admitting: Family

## 2014-05-17 VITALS — BP 178/73 | HR 44 | Temp 97.7°F | Resp 14 | Ht 73.0 in | Wt 236.0 lb

## 2014-05-17 DIAGNOSIS — Z4802 Encounter for removal of sutures: Secondary | ICD-10-CM

## 2014-05-17 NOTE — Progress Notes (Signed)
    Postoperative Access Visit   History of Present Illness  [name] is a 63 y.o. year old male who is s/p resection of redundant left basilic vein transposition, plication of aneurysmal degeneration by Dr. Trula Slade on 05/03/14, returns today for removal of sutures; he missed his 05/13/14 appointment for this. Pt denies fever or chills, denies drainage. He dialyzes T-TH-Sat; states the pain in his left hand has not improved, that he cannot complete the 4 hours of dialysis due to left hand pain, he is able to complete about 3-3.5 hours. The left hand pain resolves later that day after dialysis.     For VQI Use Only  PRE-ADM LIVING: Home  AMB STATUS: Ambulatory  Physical Examination Filed Vitals:   05/17/14 1606  BP: 178/73  Pulse: 44  Temp: 97.7 F (36.5 C)  TempSrc: Oral  Resp: 14  Height: 6\' 1"  (1.854 m)  Weight: 236 lb (107.049 kg)  SpO2: 100%   Body mass index is 31.14 kg/(m^2).  There is mild erythema, bruising, and swelling at the left upper arm AVF, no drainage, the incision edges are well proximated, sutures in place, pt states this has not changed since the procedure, the proximal part of this AVF is used for HD. The left upper arm AVF has an audible bruit and a palpable thrill. Left radial pulse is faintly palpable.  Medical Decision Making  [name] is a 63 y.o. year old male who presents s/p resection of redundant left basilic vein transposition, plication of aneurysmal degeneration on 05/03/14. Sutures removed, steri strips applied.  Follow up with Dr. Trula Slade on Monday, 05/20/14 as scheduled   NICKEL, Sharmon Leyden, RN, MSN, FNP-C [facility] Office: [phone]  05/17/2014, 9:25 AM  Clinic MD: Bridgett Larsson

## 2014-05-20 ENCOUNTER — Ambulatory Visit (INDEPENDENT_AMBULATORY_CARE_PROVIDER_SITE_OTHER): Payer: Medicare Other | Admitting: Cardiology

## 2014-05-20 DIAGNOSIS — I4891 Unspecified atrial fibrillation: Secondary | ICD-10-CM

## 2014-05-20 DIAGNOSIS — Z7901 Long term (current) use of anticoagulants: Secondary | ICD-10-CM

## 2014-05-24 ENCOUNTER — Encounter: Payer: Self-pay | Admitting: Surgery

## 2014-05-27 ENCOUNTER — Ambulatory Visit (INDEPENDENT_AMBULATORY_CARE_PROVIDER_SITE_OTHER): Payer: 59 | Admitting: Surgery

## 2014-05-27 ENCOUNTER — Encounter: Payer: Self-pay | Admitting: Surgery

## 2014-05-27 VITALS — BP 172/70 | HR 97 | Ht 73.0 in | Wt 239.6 lb

## 2014-05-27 DIAGNOSIS — N186 End stage renal disease: Secondary | ICD-10-CM

## 2014-05-27 NOTE — Progress Notes (Signed)
The patient is back for follow-up.  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  On 76/16/0737 he underwent plication of the aneurysmal changes to his left basilic vein transposition as well as banding of his proximal anastomosis.  He states that his symptoms of steal or improved with the exception of the last 15 minutes of his dialysis treatment.  He feels that the ulcers on his hand are getting better.  On examination his incisions are healing nicely.  He just has scabs on his hand which appear to be improved.  I discussed with the patient that if his hand pain becomes intolerable or if he develops wounds on his hand, the next step would be ligation of the fistula.  We discussed that if we had to move the fistula to his right arm that I would get an angiogram beforehand to evaluate the blood flow to his hand.  At this time he would like to continue using the left arm.  They will be able to access the surgically repaired area on December 20.  The patient will follow-up on an as-needed basis  [name]

## 2014-05-28 LAB — PROTIME-INR: INR: 2.4 — AB (ref 0.9–1.1)

## 2014-05-30 ENCOUNTER — Ambulatory Visit (INDEPENDENT_AMBULATORY_CARE_PROVIDER_SITE_OTHER): Payer: Medicare Other

## 2014-05-30 DIAGNOSIS — I4891 Unspecified atrial fibrillation: Secondary | ICD-10-CM

## 2014-05-30 DIAGNOSIS — Z7901 Long term (current) use of anticoagulants: Secondary | ICD-10-CM

## 2014-06-20 ENCOUNTER — Emergency Department (HOSPITAL_COMMUNITY): Payer: Medicare Other

## 2014-06-20 ENCOUNTER — Encounter (HOSPITAL_COMMUNITY): Payer: Self-pay | Admitting: Cardiology

## 2014-06-20 ENCOUNTER — Emergency Department (HOSPITAL_COMMUNITY)
Admission: EM | Admit: 2014-06-20 | Discharge: 2014-06-20 | Disposition: A | Discharge status: Home / Self Care | Payer: Medicare Other | Attending: Emergency Medicine | Admitting: Emergency Medicine

## 2014-06-20 DIAGNOSIS — N186 End stage renal disease: Secondary | ICD-10-CM | POA: Diagnosis not present

## 2014-06-20 DIAGNOSIS — G43909 Migraine, unspecified, not intractable, without status migrainosus: Secondary | ICD-10-CM | POA: Insufficient documentation

## 2014-06-20 DIAGNOSIS — Z8739 Personal history of other diseases of the musculoskeletal system and connective tissue: Secondary | ICD-10-CM | POA: Diagnosis not present

## 2014-06-20 DIAGNOSIS — I2510 Atherosclerotic heart disease of native coronary artery without angina pectoris: Secondary | ICD-10-CM | POA: Diagnosis not present

## 2014-06-20 DIAGNOSIS — Z87438 Personal history of other diseases of male genital organs: Secondary | ICD-10-CM | POA: Diagnosis not present

## 2014-06-20 DIAGNOSIS — I5022 Chronic systolic (congestive) heart failure: Secondary | ICD-10-CM | POA: Insufficient documentation

## 2014-06-20 DIAGNOSIS — J441 Chronic obstructive pulmonary disease with (acute) exacerbation: Secondary | ICD-10-CM | POA: Insufficient documentation

## 2014-06-20 DIAGNOSIS — E119 Type 2 diabetes mellitus without complications: Secondary | ICD-10-CM | POA: Insufficient documentation

## 2014-06-20 DIAGNOSIS — Z8659 Personal history of other mental and behavioral disorders: Secondary | ICD-10-CM | POA: Diagnosis not present

## 2014-06-20 DIAGNOSIS — K219 Gastro-esophageal reflux disease without esophagitis: Secondary | ICD-10-CM | POA: Insufficient documentation

## 2014-06-20 DIAGNOSIS — R05 Cough: Secondary | ICD-10-CM | POA: Diagnosis present

## 2014-06-20 DIAGNOSIS — Z992 Dependence on renal dialysis: Secondary | ICD-10-CM | POA: Diagnosis not present

## 2014-06-20 DIAGNOSIS — R079 Chest pain, unspecified: Secondary | ICD-10-CM

## 2014-06-20 DIAGNOSIS — Z79899 Other long term (current) drug therapy: Secondary | ICD-10-CM | POA: Diagnosis not present

## 2014-06-20 DIAGNOSIS — Z7901 Long term (current) use of anticoagulants: Secondary | ICD-10-CM | POA: Diagnosis not present

## 2014-06-20 DIAGNOSIS — I120 Hypertensive chronic kidney disease with stage 5 chronic kidney disease or end stage renal disease: Secondary | ICD-10-CM | POA: Insufficient documentation

## 2014-06-20 DIAGNOSIS — Z87891 Personal history of nicotine dependence: Secondary | ICD-10-CM | POA: Diagnosis not present

## 2014-06-20 DIAGNOSIS — Z87442 Personal history of urinary calculi: Secondary | ICD-10-CM | POA: Insufficient documentation

## 2014-06-20 LAB — CBC
HEMATOCRIT: 34.2 % — AB (ref 39.0–52.0)
Hemoglobin: 11.1 g/dL — ABNORMAL LOW (ref 13.0–17.0)
MCH: 29.8 pg (ref 26.0–34.0)
MCHC: 32.5 g/dL (ref 30.0–36.0)
MCV: 91.9 fL (ref 78.0–100.0)
PLATELETS: 253 10*3/uL (ref 150–400)
RBC: 3.72 MIL/uL — AB (ref 4.22–5.81)
RDW: 15.6 % — AB (ref 11.5–15.5)
WBC: 11.8 10*3/uL — AB (ref 4.0–10.5)

## 2014-06-20 LAB — BASIC METABOLIC PANEL
Anion gap: 12 (ref 5–15)
BUN: 26 mg/dL — ABNORMAL HIGH (ref 6–23)
CO2: 24 mmol/L (ref 19–32)
Calcium: 8.3 mg/dL — ABNORMAL LOW (ref 8.4–10.5)
Chloride: 99 mEq/L (ref 96–112)
Creatinine, Ser: 4.81 mg/dL — ABNORMAL HIGH (ref 0.50–1.35)
GFR calc Af Amer: 14 mL/min — ABNORMAL LOW (ref >90)
GFR calc non Af Amer: 12 mL/min — ABNORMAL LOW (ref >90)
Glucose, Bld: 351 mg/dL — ABNORMAL HIGH (ref 70–99)
Potassium: 3.8 mmol/L (ref 3.5–5.1)
SODIUM: 135 mmol/L (ref 135–145)

## 2014-06-20 LAB — I-STAT TROPONIN, ED: TROPONIN I, POC: 0.06 ng/mL (ref 0.00–0.08)

## 2014-06-20 LAB — BRAIN NATRIURETIC PEPTIDE: B Natriuretic Peptide: 1189.4 pg/mL — ABNORMAL HIGH (ref 0.0–100.0)

## 2014-06-20 MED ORDER — AZITHROMYCIN 250 MG PO TABS
500.0000 mg | ORAL_TABLET | Freq: Once | ORAL | Status: AC
  Administered 2014-06-20: 500 mg via ORAL
  Filled 2014-06-20: qty 2

## 2014-06-20 MED ORDER — AZITHROMYCIN 250 MG PO TABS
500.0000 mg | ORAL_TABLET | Freq: Once | ORAL | Status: DC
  Filled 2014-06-20: qty 2

## 2014-06-20 MED ORDER — AZITHROMYCIN 250 MG PO TABS: 250.0000 mg | ORAL_TABLET | Freq: Every day | ORAL | Status: DC

## 2014-06-20 NOTE — ED Notes (Signed)
Pt reports that he has been SOB and had a cough for a couple of days. Reports that he was at dialysis today and became increasingly SOB. Denies any chest pain at this time

## 2014-06-20 NOTE — ED Notes (Signed)
NP at bedside.

## 2014-06-20 NOTE — ED Notes (Signed)
Pt verbalized understanding of discharge instructions/prescriptions. No further questions.

## 2014-06-20 NOTE — ED Provider Notes (Signed)
CSN: [id number]     Arrival date & time 06/20/14  1220 History   First MD Initiated Contact with Patient 06/20/14 1358     Chief Complaint  Patient presents with  . Cough  . Shortness of Breath   (Consider location/radiation/quality/duration/timing/severity/associated sxs/prior Treatment) HPI  GAD AYMOND is a 64 year old male presenting with general fatigue, cough and shortness of breath 3 days. He reports he's had nasal congestion, runny nose in this timeframe also, but his generalized weakness has gotten progressively worse. He attended his dialysis treatment this morning and was able to get his full treatment.  His cough is productive with reportedly gray sputum. He denies fever, chills, chest pain, nausea, vomiting or diarrhea.   Past Medical History  Diagnosis Date  . GERD (gastroesophageal reflux disease)   . Hypertension   . Neuromuscular disorder     neurophathy  . Atrial fibrillation     chronically anticoagulated with coumadin  . Hyperlipidemia   . Atrial flutter 07/07/2009    s/p ablation  . GOUT 08/01/2008  . ERECTILE DYSFUNCTION 08/01/2008  . PERIPHERAL NEUROPATHY 08/01/2008  . CORONARY ARTERY DISEASE 08/01/2008    non obstructive per cath 1/10; Dr. Olevia Perches  . 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 Gastroparesis 08/01/2008  . NEPHROLITHIASIS, HX OF 08/01/2008  . Chronic systolic heart failure 06/22/2991  . COPD 07/21/2010  . Diverticulosis of colon (without mention of hemorrhage) 10/28/2010  . Benign neoplasm of colon 10/28/2010  . Hemorrhoids, internal 10/28/2010  . Second degree Mobitz I AV block     asymptomatic  . Cyst     "I've had 4-5; pretty much all of them have got infected" (06/13/2013)  . Muscle spasm of back     back muscle spasms and pt is unable to lay flat   . Abscess of back     lower middle portion of back  . Peripheral vascular disease     poor circulation  . Pneumonia     "first time I've been dx'd was today" (06/13/2013)  .  Shortness of breath     "just related to this pneumonia" (06/13/2013)  . Type II diabetes mellitus dx'd 1980's  . Migraines     "none since the 1980's" (06/13/2013)  . End stage renal failure on dialysis 08/01/2008    "[place]; TTS" (06/13/2013)  . Renal cyst     bilateral  . Basal cell carcinoma     nose  . Depression     situaltional    Past Surgical History  Procedure Laterality Date  . Atrial ablation surgery  07-2008, 07/08/09    Dr Rayann Heman  . Foot surgery Right 09-2009    infection got into the bone; had to put rod in to hold foot together" (06/13/2013)  . Colonoscopy  2011?    Dr.Orr  . Av fistula placement Left 2010    upper arm  . Elbow fracture surgery Right 1974  . Pilonidal cyst excision  1971  . Cataract extraction Right ~ 2012  . I&d extremity Left 12/29/2012    Procedure: IRRIGATION AND DEBRIDEMENT EXTREMITY;  Surgeon: Newt Minion, MD;  Location: [place];  Service: Orthopedics;  Laterality: Left;  Irrigation and Debridement Left Leg, VAC, [place]  . Toe amputation Right 01/18/2013    "little toe" Dr Sharol Given  . I&d extremity Bilateral 01/18/2013    Procedure: IRRIGATION AND DEBRIDEMENT EXTREMITYLEFT LEG AND RIGHT FOOT;  Surgeon: Newt Minion, MD;  Location:  [facility];  Service: Orthopedics;  Laterality: Bilateral;  Left Leg Excisional Debridement  . Skin graft Left     left leg within the past month  . Amputation Left 02/02/2013    Procedure: AMPUTATION BELOW KNEE;  Surgeon: Newt Minion, MD;  Location: [place];  Service: Orthopedics;  Laterality: Left;  Left Below Knee Amputation  . Fracture surgery    . Vasectomy    . Amputation Left 03/23/2013    Procedure: REVISION OF BELOW THE KNEE  AMPUTATION ;  Surgeon: Newt Minion, MD;  Location: [place];  Service: Orthopedics;  Laterality: Left;  Left Below Knee Amputation Revision  . I&d extremity Left 05/04/2013    Procedure: Irrigation and Debridement Left Below Knee Ampuation;  Surgeon: Newt Minion, MD;  Location: [place];   Service: Orthopedics;  Laterality: Left;  Irrigation and Debridement Left Below Knee Ampuation  . Amputation Left 05/30/2013    Procedure: REVISION AMPUTATION BELOW KNEE;  Surgeon: Newt Minion, MD;  Location: [facility] [place];  Service: Orthopedics;  Laterality: Left;  Revision Left Below Knee Amputation, Possible Above Knee Amputation  . Tonsillectomy    . Amputation Left 02/13/2014    Procedure: Left Long Finger Amputation PIP Joint;  Surgeon: Newt Minion, MD;  Location: [place];  Service: Orthopedics;  Laterality: Left;  . Revison of arteriovenous fistula Left 70/62/3762    Procedure: PLICATION, RESECTION & BANDING ,OF LEFT UPPER ARM BASILIC VEIN TRANSPOSITION;  Surgeon: Serafina Mitchell, MD;  Location: [facility];  Service: Vascular;  Laterality: Left;   Family History  Problem Relation Age of Onset  . Diabetes Maternal Grandmother   . Diabetes Paternal Grandmother   . Cancer Neg Hx     no FH of Colon Cancer  . Diabetes Father    History  Substance Use Topics  . Smoking status: Former Smoker -- 0.50 packs/day for 44 years    Types: Cigarettes    Quit date: 07/17/2013  . Smokeless tobacco: Never Used  . Alcohol Use: No     Comment: 06/13/2013 "no alcohol in 40 yrs"    Review of Systems  Constitutional: Positive for fatigue. Negative for fever and chills.  HENT: Positive for congestion and rhinorrhea. Negative for sore throat.   Eyes: Negative for visual disturbance.  Respiratory: Positive for cough and shortness of breath.   Cardiovascular: Negative for chest pain and leg swelling.  Gastrointestinal: Negative for nausea, vomiting and diarrhea.  Genitourinary: Negative for dysuria.  Musculoskeletal: Negative for myalgias.  Skin: Negative for rash.  Neurological: Negative for weakness, numbness and headaches.    Allergies  Review of patient's allergies indicates no known allergies.  Home Medications   Prior to Admission medications   Medication Sig Start Date End Date Taking?  Authorizing Provider  albuterol (PROVENTIL HFA;VENTOLIN HFA) 108 (90 BASE) MCG/ACT inhaler Inhale 2 puffs into the lungs every 4 (four) hours as needed for wheezing or shortness of breath. 06/17/13   Shanker Kristeen Mans, MD  calcium acetate (PHOSLO) 667 MG capsule Take 1,334 mg by mouth 3 (three) times daily with meals.     Historical Provider, MD  CHANTIX 0.5 MG tablet Take 0.5 mg by mouth daily. 04/10/14   Historical Provider, MD  folic acid-vitamin b complex-vitamin c-selenium-zinc (DIALYVITE) 3 MG TABS tablet Take 1 tablet by mouth daily.    Historical Provider, MD  FOSRENOL 1000 MG chewable tablet Chew 2 tablets by mouth 3 (three) times daily with meals.  09/24/10   Historical  Provider, MD  gabapentin (NEURONTIN) 300 MG capsule Take 300 mg by mouth at bedtime.     Historical Provider, MD  HYDROcodone-acetaminophen (NORCO) 5-325 MG per tablet Take 1 tablet by mouth every 6 (six) hours as needed. 05/03/14   Alvia Grove, PA-C  loperamide (IMODIUM) 2 MG capsule Take 2 mg by mouth daily as needed for diarrhea or loose stools.     Historical Provider, MD  NOVOLOG FLEXPEN 100 UNIT/ML FlexPen INJECT 30 UNITS INTO THE SKIN 3 TIMES DAILY BEFORE MEALS. 04/30/14   Renato Shin, MD  omeprazole (PRILOSEC) 20 MG capsule Take 1 capsule (20 mg total) by mouth daily. 03/29/14   Renato Shin, MD  SENSIPAR 30 MG tablet Take 30 mg by mouth daily. 04/25/14   Historical Provider, MD  temazepam (RESTORIL) 30 MG capsule Take 30 mg by mouth at bedtime as needed for sleep.     Historical Provider, MD  tiotropium (SPIRIVA) 18 MCG inhalation capsule Place 1 capsule (18 mcg total) into inhaler and inhale daily. 06/17/13   Shanker Kristeen Mans, MD  warfarin (COUMADIN) 5 MG tablet Take 5 mg by mouth daily.    Historical Provider, MD   BP 113/62 mmHg  Pulse 100  Temp(Src) 98.6 F (37 C) (Oral)  Resp 18  Wt 225 lb 8.5 oz (102.3 kg)  SpO2 100% Physical Exam  Constitutional: He appears well-developed and well-nourished. No  distress.  HENT:  Head: Normocephalic and atraumatic.  Mouth/Throat: Oropharynx is clear and moist. No oropharyngeal exudate.  Eyes: Conjunctivae are normal.  Neck: Neck supple. No thyromegaly present.  Cardiovascular: Normal rate and intact distal pulses.  An irregular rhythm present.  Pulmonary/Chest: Effort normal. No respiratory distress. He has no wheezes. He has rhonchi in the right lower field and the left lower field. He has no rales. He exhibits no tenderness.  Abdominal: Soft. There is no tenderness.  Musculoskeletal: He exhibits no tenderness.  Lymphadenopathy:    He has no cervical adenopathy.  Neurological: He is alert.  Skin: Skin is warm and dry. No rash noted. He is not diaphoretic.  Psychiatric: He has a normal mood and affect.  Nursing note and vitals reviewed.   ED Course  Procedures (including critical care time) Labs Review Labs Reviewed  CBC - Abnormal; Notable for the following:    WBC 11.8 (*)    RBC 3.72 (*)    Hemoglobin 11.1 (*)    HCT 34.2 (*)    RDW 15.6 (*)    All other components within normal limits  BASIC METABOLIC PANEL - Abnormal; Notable for the following:    Glucose, Bld 351 (*)    BUN 26 (*)    Creatinine, Ser 4.81 (*)    Calcium 8.3 (*)    GFR calc non Af Amer 12 (*)    GFR calc Af Amer 14 (*)    All other components within normal limits  BRAIN NATRIURETIC PEPTIDE - Abnormal; Notable for the following:    B Natriuretic Peptide 1189.4 (*)    All other components within normal limits  I-STAT TROPOININ, ED    Imaging Review Dg Chest 2 View  06/20/2014   CLINICAL DATA:  Cough, shortness of breath, chest pain for 2 days  EXAM: CHEST  2 VIEW  COMPARISON:  07/04/2013  FINDINGS: Cardiomediastinal silhouette is stable. Mild hyperinflation. Trace bilateral pleural effusion with bilateral basilar atelectasis. No segmental infiltrate or pulmonary edema. Mild degenerative changes thoracic spine.  IMPRESSION: No infiltrate or pulmonary edema. Trace  bilateral pleural effusion with bilateral basilar posterior atelectasis. Mild hyperinflation.   Electronically Signed   By: Lahoma Crocker M.D.   On: 06/20/2014 14:01     EKG Interpretation None      MDM   Final diagnoses:  COPD exacerbation   64 yo with productive cough and fatigue but no chest pain. Protocol labs done in triage show no stemi on the EKG, troponin negative and BUN/creatinine near his baseline.  His cxr is negative for pneumonia or pulmonary edema. He has mild rhonchi bilat but no rales. His established afib is rate controlled on the monitor.  Discussed case with Dr. Betsey Holiday.  Pt has appointment tomorrow with his cardiologist. Discussed with pt symptoms related to exacerbation of his COPD and will treat course of antibiotics. Pt is well-appearing, in no acute distress and vital signs are stable.  They appear safe to be discharged.  Discharge include follow-up with their PCP.  Return precautions provided. Pt aware of plan and in agreement.      Filed Vitals:   06/20/14 1409 06/20/14 1415 06/20/14 1430 06/20/14 1444  BP:  159/78 143/65 159/54  Pulse:  93 93 82  Temp:    98.2 F (36.8 C)  TempSrc:    Oral  Resp:  18 18 18   Weight:      SpO2: 100% 97% 95% 97%   Meds given in ED:  Medications  azithromycin (ZITHROMAX) tablet 500 mg (500 mg Oral Given 06/20/14 1450)    Discharge Medication List as of 06/20/2014  2:43 PM    START taking these medications   Details  azithromycin (ZITHROMAX) 250 MG tablet Take 1 tablet (250 mg total) by mouth daily. You had your first dose today.  Start tomorrow., Starting 06/20/2014, Until Discontinued, Print           Britt Bottom, NP 06/22/14 [place], MD 06/23/14 [phone]

## 2014-06-20 NOTE — Discharge Instructions (Signed)
These follow directions provided. Be sure to follow-up with your primary care provider to ensure you're getting better. Take the antibiotic daily for 4 days, starting tomorrow. Don't hesitate to return for any new, worsening, or concerning symptoms.  SEEK IMMEDIATE MEDICAL CARE IF:  You have shortness of breath while you are resting.  You have shortness of breath that prevents you from:  Being able to talk.  Performing your usual physical activities.  You have chest pain lasting longer than 5 minutes.  Your skin color is more cyanotic than usual.

## 2014-06-21 ENCOUNTER — Ambulatory Visit: Payer: Medicare Other | Admitting: Internal Medicine

## 2014-06-26 ENCOUNTER — Other Ambulatory Visit: Payer: Self-pay | Admitting: Internal Medicine

## 2014-06-27 LAB — PROTIME-INR: INR: 2.8 — AB (ref 0.9–1.1)

## 2014-06-28 ENCOUNTER — Ambulatory Visit (INDEPENDENT_AMBULATORY_CARE_PROVIDER_SITE_OTHER): Payer: Medicare Other | Admitting: Cardiovascular Disease

## 2014-06-28 ENCOUNTER — Ambulatory Visit: Payer: Medicare Other | Admitting: Internal Medicine

## 2014-06-28 DIAGNOSIS — I4891 Unspecified atrial fibrillation: Secondary | ICD-10-CM

## 2014-06-28 DIAGNOSIS — Z7901 Long term (current) use of anticoagulants: Secondary | ICD-10-CM

## 2014-07-09 ENCOUNTER — Other Ambulatory Visit: Payer: Self-pay | Admitting: Endocrinology

## 2014-07-11 LAB — PROTIME-INR: INR: 2.9 — AB (ref 0.9–1.1)

## 2014-07-15 ENCOUNTER — Ambulatory Visit (INDEPENDENT_AMBULATORY_CARE_PROVIDER_SITE_OTHER): Payer: Medicare Other | Admitting: Cardiovascular Disease

## 2014-07-15 DIAGNOSIS — Z7901 Long term (current) use of anticoagulants: Secondary | ICD-10-CM

## 2014-07-15 DIAGNOSIS — I4891 Unspecified atrial fibrillation: Secondary | ICD-10-CM

## 2014-07-22 ENCOUNTER — Ambulatory Visit: Payer: Medicare Other | Admitting: Endocrinology

## 2014-07-24 ENCOUNTER — Ambulatory Visit (INDEPENDENT_AMBULATORY_CARE_PROVIDER_SITE_OTHER): Payer: Medicare Other | Admitting: Ophthalmology

## 2014-07-24 DIAGNOSIS — E10319 Type 1 diabetes mellitus with unspecified diabetic retinopathy without macular edema: Secondary | ICD-10-CM | POA: Diagnosis not present

## 2014-07-24 DIAGNOSIS — E10339 Type 1 diabetes mellitus with moderate nonproliferative diabetic retinopathy without macular edema: Secondary | ICD-10-CM | POA: Diagnosis not present

## 2014-07-24 DIAGNOSIS — H43813 Vitreous degeneration, bilateral: Secondary | ICD-10-CM | POA: Diagnosis not present

## 2014-07-24 DIAGNOSIS — H2512 Age-related nuclear cataract, left eye: Secondary | ICD-10-CM | POA: Diagnosis not present

## 2014-07-24 DIAGNOSIS — I10 Essential (primary) hypertension: Secondary | ICD-10-CM | POA: Diagnosis not present

## 2014-07-24 DIAGNOSIS — H35033 Hypertensive retinopathy, bilateral: Secondary | ICD-10-CM

## 2014-07-29 ENCOUNTER — Other Ambulatory Visit: Payer: Self-pay | Admitting: Endocrinology

## 2014-08-15 LAB — PROTIME-INR: INR: 2.1 — AB (ref 0.9–1.1)

## 2014-08-16 ENCOUNTER — Encounter: Payer: Self-pay | Admitting: Gastroenterology

## 2014-08-16 ENCOUNTER — Ambulatory Visit (INDEPENDENT_AMBULATORY_CARE_PROVIDER_SITE_OTHER): Payer: Medicare Other | Admitting: Cardiovascular Disease

## 2014-08-16 DIAGNOSIS — Z7901 Long term (current) use of anticoagulants: Secondary | ICD-10-CM

## 2014-08-16 DIAGNOSIS — I4891 Unspecified atrial fibrillation: Secondary | ICD-10-CM

## 2014-09-05 LAB — PROTIME-INR: INR: 2.8 — AB (ref 0.9–1.1)

## 2014-09-09 ENCOUNTER — Ambulatory Visit (INDEPENDENT_AMBULATORY_CARE_PROVIDER_SITE_OTHER): Payer: Medicare Other | Admitting: Internal Medicine

## 2014-09-09 DIAGNOSIS — I4891 Unspecified atrial fibrillation: Secondary | ICD-10-CM

## 2014-09-09 DIAGNOSIS — Z7901 Long term (current) use of anticoagulants: Secondary | ICD-10-CM

## 2014-09-10 ENCOUNTER — Other Ambulatory Visit: Payer: Self-pay | Admitting: Internal Medicine

## 2014-09-16 ENCOUNTER — Ambulatory Visit: Payer: 59 | Admitting: Internal Medicine

## 2014-09-30 ENCOUNTER — Ambulatory Visit (INDEPENDENT_AMBULATORY_CARE_PROVIDER_SITE_OTHER): Payer: Medicare Other | Admitting: Physician Assistant

## 2014-09-30 ENCOUNTER — Encounter: Payer: Self-pay | Admitting: Physician Assistant

## 2014-09-30 VITALS — BP 122/86 | HR 101 | Ht 73.0 in | Wt 238.0 lb

## 2014-09-30 DIAGNOSIS — Z01818 Encounter for other preprocedural examination: Secondary | ICD-10-CM | POA: Diagnosis not present

## 2014-09-30 DIAGNOSIS — I4891 Unspecified atrial fibrillation: Secondary | ICD-10-CM | POA: Diagnosis not present

## 2014-09-30 MED ORDER — VARENICLINE TARTRATE 0.5 MG PO TABS: 0.5000 mg | ORAL_TABLET | Freq: Every day | ORAL | Status: AC

## 2014-09-30 NOTE — Assessment & Plan Note (Signed)
Nonobstructive CAD on cath in 2010. Normal Lexi scan Myoview in 2013.

## 2014-09-30 NOTE — Patient Instructions (Addendum)
Medication Instructions:  Your physician recommends that you continue on your current medications as directed. Please refer to the Current Medication list given to you today.   Labwork: NONE TODAY   Testing/Procedures:   NONE TODAY   Follow-Up: Your physician wants you to follow-up in: [place] will receive a reminder letter in the mail two months in advance. If you don't receive a letter, please call our office to schedule the follow-up appointment.   Any Other Special Instructions Will Be Listed Below (If Applicable).   YOU HAVE BEEN CLEARED FOR SURGERY AND THE NOTE HAS BEEN SENT TO YOUR EYE DR

## 2014-09-30 NOTE — Assessment & Plan Note (Signed)
Fortunately the patient ran out of his Chantix and began smoking 2 weeks ago when he found out he was no longer a candidate for renal transplant. He would like another prescription for Chantix as this helps with smoking cessation. Will prescribe 0.5 mg once daily because he is on dialysis.

## 2014-09-30 NOTE — Progress Notes (Signed)
Cardiology Office Note   Date:  09/30/2014   ID:  [name], DOB 29-May-1951, MRN [id number]  PCP:  Perry Shin, MD  Cardiologist:  Dr. Rayann Heman  Chief Complaint: Presurgical clearance    History of Present Illness: [name] is a 64 y.o. male who presents for note of clearance prior to undergoing cataract surgery. He has a history of atrial flutter/SVT status post ablation in 2011, chronic atrial fibrillation on Coumadin, chronic kidney disease on dialysis, hypertension, diabetes mellitus type 2, and GERD. He had nonobstructive CAD on cath in 2010. Office visit with Dr. Rayann Heman in 2013 he had second-degree Mobitz 1 AV block which she was asymptomatic with and there was no indication for pacemaker. Lexi scan Myoview in 2013 was normal EF 56%. 2-D echo at that time showed normal LV function EF 55-60% with moderate LVH mildly dilated LA. These were done in anticipation of kidney transplant.  Patient was supposed to have a kidney transplant at [facility] in 2013. He was actually on the table twice, but it wasn't a good match. Now he is too weak and walks with a walker.He was told 3 weeks ago that he is no longer a candidate for a transplant at [facility]. He is thinking about going to [place]. He became depressed and started smoking 2 weeks. 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     Past Medical History  Diagnosis Date  . GERD (gastroesophageal reflux disease)   . Hypertension   . Neuromuscular disorder     neurophathy  . Atrial fibrillation     chronically anticoagulated with coumadin  . Hyperlipidemia   . Atrial flutter 07/07/2009    s/p ablation  . GOUT 08/01/2008  . ERECTILE DYSFUNCTION 08/01/2008  . PERIPHERAL NEUROPATHY 08/01/2008  . CORONARY ARTERY DISEASE 08/01/2008    non obstructive per cath 1/10; Dr. Olevia Perches  . 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 Gastroparesis 08/01/2008  . NEPHROLITHIASIS, HX OF 08/01/2008  .  Chronic systolic heart failure 4/0/9811  . COPD 07/21/2010  . Diverticulosis of colon (without mention of hemorrhage) 10/28/2010  . Benign neoplasm of colon 10/28/2010  . Hemorrhoids, internal 10/28/2010  . Second degree Mobitz I AV block     asymptomatic  . Cyst     "I've had 4-5; pretty much all of them have got infected" (06/13/2013)  . Muscle spasm of back     back muscle spasms and pt is unable to lay flat   . Abscess of back     lower middle portion of back  . Peripheral vascular disease     poor circulation  . Pneumonia     "first time I've been dx'd was today" (06/13/2013)  . Shortness of breath     "just related to this pneumonia" (06/13/2013)  . Type II diabetes mellitus dx'd 1980's  . Migraines     "none since the 1980's" (06/13/2013)  . End stage renal failure on dialysis 08/01/2008    "[place]; TTS" (06/13/2013)  . Renal cyst     bilateral  . Basal cell carcinoma     nose  . Depression     situaltional     Past Surgical History  Procedure Laterality Date  . Atrial ablation surgery  07-2008, 07/08/09    Dr Rayann Heman  . Foot surgery Right 09-2009    infection got into the bone; had to put rod in to  hold foot together" (06/13/2013)  . Colonoscopy  2011?    Dr.Orr  . Av fistula placement Left 2010    upper arm  . Elbow fracture surgery Right 1974  . Pilonidal cyst excision  1971  . Cataract extraction Right ~ 2012  . I&d extremity Left 12/29/2012    Procedure: IRRIGATION AND DEBRIDEMENT EXTREMITY;  Surgeon: Newt Minion, MD;  Location: [place];  Service: Orthopedics;  Laterality: Left;  Irrigation and Debridement Left Leg, VAC, [place]  . Toe amputation Right 01/18/2013    "little toe" Dr Sharol Given  . I&d extremity Bilateral 01/18/2013    Procedure: IRRIGATION AND DEBRIDEMENT EXTREMITYLEFT LEG AND RIGHT FOOT;  Surgeon: Newt Minion, MD;  Location: [place];  Service: Orthopedics;  Laterality: Bilateral;  Left Leg Excisional Debridement  . Skin graft Left     left leg within  the past month  . Amputation Left 02/02/2013    Procedure: AMPUTATION BELOW KNEE;  Surgeon: Newt Minion, MD;  Location: [place];  Service: Orthopedics;  Laterality: Left;  Left Below Knee Amputation  . Fracture surgery    . Vasectomy    . Amputation Left 03/23/2013    Procedure: REVISION OF BELOW THE KNEE  AMPUTATION ;  Surgeon: Newt Minion, MD;  Location: [place];  Service: Orthopedics;  Laterality: Left;  Left Below Knee Amputation Revision  . I&d extremity Left 05/04/2013    Procedure: Irrigation and Debridement Left Below Knee Ampuation;  Surgeon: Newt Minion, MD;  Location: [place];  Service: Orthopedics;  Laterality: Left;  Irrigation and Debridement Left Below Knee Ampuation  . Amputation Left 05/30/2013    Procedure: REVISION AMPUTATION BELOW KNEE;  Surgeon: Newt Minion, MD;  Location: [place];  Service: Orthopedics;  Laterality: Left;  Revision Left Below Knee Amputation, Possible Above Knee Amputation  . Tonsillectomy    . Amputation Left 02/13/2014    Procedure: Left Long Finger Amputation PIP Joint;  Surgeon: Newt Minion, MD;  Location: [place];  Service: Orthopedics;  Laterality: Left;  . Revison of arteriovenous fistula Left 35/32/9924    Procedure: PLICATION, RESECTION & BANDING ,OF LEFT UPPER ARM BASILIC VEIN TRANSPOSITION;  Surgeon: Serafina Mitchell, MD;  Location: [facility];  Service: Vascular;  Laterality: Left;     Current Outpatient Prescriptions  Medication Sig Dispense Refill  . albuterol (PROVENTIL HFA;VENTOLIN HFA) 108 (90 BASE) MCG/ACT inhaler Inhale 2 puffs into the lungs every 4 (four) hours as needed for wheezing or shortness of breath. 1 Inhaler 2  . calcium acetate (PHOSLO) 667 MG capsule Take 1,334 mg by mouth 3 (three) times daily with meals.     . CHANTIX 0.5 MG tablet Take 0.5 mg by mouth daily.  0  . COUMADIN 5 MG tablet TAKE AS DIRECTED BY COUMADIN CLINIC 40 tablet 3  . folic acid-vitamin b complex-vitamin c-selenium-zinc (DIALYVITE) 3 MG TABS tablet Take 1  tablet by mouth daily.    Marland Kitchen FOSRENOL 1000 MG chewable tablet Chew 2 tablets by mouth 3 (three) times daily with meals.     . gabapentin (NEURONTIN) 300 MG capsule Take 300 mg by mouth at bedtime.     Marland Kitchen HYDROcodone-acetaminophen (NORCO) 5-325 MG per tablet Take 1 tablet by mouth every 6 (six) hours as needed. 15 tablet 0  . loperamide (IMODIUM) 2 MG capsule Take 2 mg by mouth daily as needed for diarrhea or loose stools.     Marland Kitchen NOVOLOG FLEXPEN 100 UNIT/ML FlexPen INJECT 30 UNITS INTO  THE SKIN 3 TIMES DAILY BEFORE MEALS. 15 mL 5  . omeprazole (PRILOSEC) 20 MG capsule Take 1 capsule (20 mg total) by mouth daily. 30 capsule 0  . SENSIPAR 30 MG tablet Take 30 mg by mouth daily.  6  . temazepam (RESTORIL) 30 MG capsule Take 30 mg by mouth at bedtime as needed for sleep.     Marland Kitchen tiotropium (SPIRIVA) 18 MCG inhalation capsule Place 1 capsule (18 mcg total) into inhaler and inhale daily. 30 capsule 12  . UNIFINE PENTIPS 31G X 5 MM MISC USE AS DIRECTED 3 TIMES A DAY 200 each PRN  . warfarin (COUMADIN) 5 MG tablet Take 5 mg by mouth daily.     No current facility-administered medications for this visit.    Allergies:   Review of patient's allergies indicates no known allergies.    Social History:  The patient  reports that he has been smoking Cigarettes.  He has a 22 pack-year smoking history. He has never used smokeless tobacco. He reports that he does not drink alcohol or use illicit drugs.   Family History:  The patient's    family history includes Diabetes in his father, maternal grandmother, and paternal grandmother. There is no history of Cancer.    ROS:  Please see the history of present illness.   Otherwise, review of systems are positive for chronic dyspnea on exertion from COPD, weak, neuropathy, chronic pain.   All other systems are reviewed and negative.    PHYSICAL EXAM: VS:  BP 122/86 mmHg  Pulse 101  Ht 6\' 1"  (1.854 m)  Wt 238 lb (107.956 kg)  BMI 31.41 kg/m2 , BMI Body mass index is  31.41 kg/(m^2). GEN: Well nourished, well developed, in no acute distress Neck: no JVD, HJR, carotid bruits, or masses Cardiac: irreg irreg, 1/6 systolic murmur LSB, positive S4, no rubs, thrill or heave,  Respiratory: Decreased BS throughout with few crackles at bases. GI: soft, nontender, nondistended, + BS MS: no deformity or atrophy Extremities: left BKA with prosthesis right without cyanosis, clubbing, edema, decreased distal pulses on right. Skin: warm and dry, no rash Neuro:  Strength and sensation are intact    EKG:  EKG is ordered today. The ekg ordered today demonstrates trivial fibrillation at 101 bpm left axis deviation   Recent Labs: 03/08/2014: ALT 11; TSH 0.81 06/20/2014: B Natriuretic Peptide 1189.4*; BUN 26*; Creatinine 4.81*; Hemoglobin 11.1*; Platelets 253; Potassium 3.8; Sodium 135    Lipid Panel    Component Value Date/Time   CHOL 143 03/08/2014 1140   TRIG 115.0 03/08/2014 1140   HDL 38.40* 03/08/2014 1140   CHOLHDL 4 03/08/2014 1140   VLDL 23.0 03/08/2014 1140   LDLCALC 82 03/08/2014 1140   LDLDIRECT 116.3 12/22/2011 1416      Wt Readings from Last 3 Encounters:  09/30/14 238 lb (107.956 kg)  06/20/14 225 lb 8.5 oz (102.3 kg)  05/27/14 239 lb 9.6 oz (108.682 kg)      Other studies Reviewed: Additional studies/ records that were reviewed today include and review of the records demonstrates:   2-D echo 12/2011 Study Conclusions  - Left ventricle: The cavity size was normal. Wall thickness   was increased in a pattern of moderate LVH. Systolic   function was normal. The estimated ejection fraction was   in the range of 55% to 60%. Wall motion was normal; there   were no regional wall motion abnormalities. - Left atrium: The atrium was mildly dilated. - Atrial septum: No  defect or patent foramen ovale was   identified.  Lexi scan Myoview 12/2011 Overall Impression:  Normal stress nuclear study. Fixed small, mild apical perfusion defect likely  represents apical thinning.  No ischemia.   LV Ejection Fraction: 56%.  LV Wall Motion:  NL LV Function; NL Wall Motion  Loralie Champagne 12/27/2011     ASSESSMENT AND PLAN: Cardiology Office Note   Preoperative clearance Patient is here for preoperative clearance before undergoing cataract surgery May 23. Patient has chronic atrial fibrillation on Coumadin therapy. He has not been told to stop his Coumadin prior to surgery. He had a Lexi scan Myoview in 2013 that showed no ischemia and normal LV function. 2-D echo showed normal LV function with moderate LVH. He's had prior ablation for SVT. He has no reactive symptoms. Discussed with Dr. Rayann Heman who saw patient as well. Should be able to proceed with the upcoming cataract surgery without any further cardiac workup.   Smoking Fortunately the patient ran out of his Chantix and began smoking 2 weeks ago when he found out he was no longer a candidate for renal transplant. He would like another prescription for Chantix as this helps with smoking cessation. Will prescribe 0.5 mg once daily because he is on dialysis.   Atrial fibrillation Chronic atrial fibrillation at 101 bpm. Patient is not on any rate lowering medications. He is asymptomatic. As with Dr. Rayann Heman who saw the patient as well. No changes. All been 6 months.   Coronary atherosclerosis Nonobstructive CAD on cath in 2010. Normal Lexi scan Myoview in 2013.   Essential hypertension Controlled      Signed, Ermalinda Barrios, PA-C  09/30/2014 9:28 AM    [facility] [address], [place], [place]  [id number] Phone: [phone]; Fax: [phone]

## 2014-09-30 NOTE — Assessment & Plan Note (Addendum)
Patient is here for preoperative clearance before undergoing cataract surgery May 23. Patient has chronic atrial fibrillation on Coumadin therapy. He has not been told to stop his Coumadin prior to surgery. He had a Lexi scan Myoview in 2013 that showed no ischemia and normal LV function. 2-D echo showed normal LV function with moderate LVH. He's had prior ablation for SVT. He has no reactive symptoms. Discussed with Dr. Rayann Heman who saw patient as well. Should be able to proceed with the upcoming cataract surgery without any further cardiac workup.

## 2014-09-30 NOTE — Assessment & Plan Note (Addendum)
Chronic atrial fibrillation at 101 bpm. Patient is not on any rate lowering medications. He is asymptomatic. As with Dr. Rayann Heman who saw the patient as well. No changes. All been 6 months.

## 2014-09-30 NOTE — Assessment & Plan Note (Signed)
Controlled.  

## 2014-10-01 ENCOUNTER — Encounter: Payer: Self-pay | Admitting: Surgery

## 2014-10-02 ENCOUNTER — Ambulatory Visit: Payer: Medicare Other | Admitting: Surgery

## 2014-10-03 ENCOUNTER — Encounter: Payer: Self-pay | Admitting: Vascular Surgery

## 2014-10-04 ENCOUNTER — Encounter: Payer: Self-pay | Admitting: Vascular Surgery

## 2014-10-04 ENCOUNTER — Ambulatory Visit (INDEPENDENT_AMBULATORY_CARE_PROVIDER_SITE_OTHER): Payer: 59 | Admitting: Vascular Surgery

## 2014-10-04 VITALS — BP 159/82 | HR 86 | Temp 98.0°F | Resp 16 | Ht 73.0 in | Wt 228.0 lb

## 2014-10-04 DIAGNOSIS — N186 End stage renal disease: Secondary | ICD-10-CM

## 2014-10-04 NOTE — Progress Notes (Signed)
[facility] [place]  Patient name: [name] MRN: [id number] DOB: 1951/01/21 Sex: male  REASON FOR VISIT: Evaluate for new hemodialysis access  HPI: [name] is a 64 y.o. male who was last seen in our office on 05/27/14 by Dr. Trula Slade. The patient has a fistula in his left arm and had some issues with steal. On 95/18/8416 he underwent plication of an aneurysmal area in his left basilic vein transposition as well as banding of his proximal anastomosis. After that his symptoms of steal improved with the exception of the last 15 minutes of his dialysis treatment. Dr. Trula Slade felt that if the symptoms progressed he would have to have his fistula ligated and he would then have to be evaluated for fistula in his right arm. If that was necessary he recommended an arteriogram to evaluate blood flow to his hand preoperatively.  He tells me today that his fistula has been working well. He does experience some occasional pain towards the end of dialysis in the left hand. However his symptoms he feels are tolerable.   Past Medical History  Diagnosis Date  . GERD (gastroesophageal reflux disease)   . Hypertension   . Neuromuscular disorder     neurophathy  . Atrial fibrillation     chronically anticoagulated with coumadin  . Hyperlipidemia   . Atrial flutter 07/07/2009    s/p ablation  . GOUT 08/01/2008  . ERECTILE DYSFUNCTION 08/01/2008  . PERIPHERAL NEUROPATHY 08/01/2008  . CORONARY ARTERY DISEASE 08/01/2008    non obstructive per cath 1/10; Dr. Olevia Perches  . 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 Gastroparesis 08/01/2008  . NEPHROLITHIASIS, HX OF 08/01/2008  . Chronic systolic heart failure 6/0/6301  . COPD 07/21/2010  . Diverticulosis of colon (without mention of hemorrhage) 10/28/2010  . Benign neoplasm of colon 10/28/2010  . Hemorrhoids, internal 10/28/2010  . Second degree Mobitz I AV block     asymptomatic  . Cyst     "I've had 4-5; pretty  much all of them have got infected" (06/13/2013)  . Muscle spasm of back     back muscle spasms and pt is unable to lay flat   . Abscess of back     lower middle portion of back  . Peripheral vascular disease     poor circulation  . Pneumonia     "first time I've been dx'd was today" (06/13/2013)  . Shortness of breath     "just related to this pneumonia" (06/13/2013)  . Type II diabetes mellitus dx'd 1980's  . Migraines     "none since the 1980's" (06/13/2013)  . End stage renal failure on dialysis 08/01/2008    "[place]; TTS" (06/13/2013)  . Renal cyst     bilateral  . Basal cell carcinoma     nose  . Depression     situaltional    Family History  Problem Relation Age of Onset  . Diabetes Maternal Grandmother   . Diabetes Paternal Grandmother   . Cancer Neg Hx     no FH of Colon Cancer  . Diabetes Father    SOCIAL HISTORY: History  Substance Use Topics  . Smoking status: Current Every Day Smoker -- 0.50 packs/day for 44 years    Types: Cigarettes    Last Attempt to Quit: 07/17/2013  . Smokeless tobacco: Never Used  . Alcohol Use: No     Comment: 06/13/2013 "no alcohol in 40 yrs"  No Known Allergies Current Outpatient Prescriptions  Medication Sig Dispense Refill  . albuterol (PROVENTIL HFA;VENTOLIN HFA) 108 (90 BASE) MCG/ACT inhaler Inhale 2 puffs into the lungs every 4 (four) hours as needed for wheezing or shortness of breath. 1 Inhaler 2  . calcium acetate (PHOSLO) 667 MG capsule Take 1,334 mg by mouth 3 (three) times daily with meals.     . COUMADIN 5 MG tablet TAKE AS DIRECTED BY COUMADIN CLINIC 40 tablet 3  . folic acid-vitamin b complex-vitamin c-selenium-zinc (DIALYVITE) 3 MG TABS tablet Take 1 tablet by mouth daily.    Marland Kitchen FOSRENOL 1000 MG chewable tablet Chew 2 tablets by mouth 3 (three) times daily with meals.     . gabapentin (NEURONTIN) 300 MG capsule Take 300 mg by mouth at bedtime.     Marland Kitchen HYDROcodone-acetaminophen (NORCO) 5-325 MG per tablet Take 1  tablet by mouth every 6 (six) hours as needed. 15 tablet 0  . loperamide (IMODIUM) 2 MG capsule Take 2 mg by mouth daily as needed for diarrhea or loose stools.     Marland Kitchen NOVOLOG FLEXPEN 100 UNIT/ML FlexPen INJECT 30 UNITS INTO THE SKIN 3 TIMES DAILY BEFORE MEALS. 15 mL 5  . omeprazole (PRILOSEC) 20 MG capsule Take 1 capsule (20 mg total) by mouth daily. 30 capsule 0  . SENSIPAR 30 MG tablet Take 30 mg by mouth daily.  6  . temazepam (RESTORIL) 30 MG capsule Take 30 mg by mouth at bedtime as needed for sleep.     Marland Kitchen tiotropium (SPIRIVA) 18 MCG inhalation capsule Place 1 capsule (18 mcg total) into inhaler and inhale daily. 30 capsule 12  . UNIFINE PENTIPS 31G X 5 MM MISC USE AS DIRECTED 3 TIMES A DAY 200 each PRN  . varenicline (CHANTIX) 0.5 MG tablet Take 1 tablet (0.5 mg total) by mouth daily. 30 tablet 6   No current facility-administered medications for this visit.   REVIEW OF SYSTEMS: Valu.Nieves ] denotes positive finding; [  ] denotes negative finding  CARDIOVASCULAR:  [ ]  chest pain   [ ]  chest pressure   [ ]  palpitations   [ ]  orthopnea   [ X] dyspnea on exertion   [ ]  claudication   [ ]  rest pain   [ ]  DVT   [ ]  phlebitis PULMONARY:   [X]  productive cough   [ ]  asthma   [ ]  wheezing NEUROLOGIC:   [ X] weakness  Valu.Nieves ] paresthesias  [ ]  aphasia  [ ]  amaurosis  [ ]  dizziness HEMATOLOGIC:   [ ]  bleeding problems   [ ]  clotting disorders MUSCULOSKELETAL:  [ ]  joint pain   [ ]  joint swelling [ ]  leg swelling GASTROINTESTINAL: [ ]   blood in stool  [ ]   hematemesis GENITOURINARY:  [ ]   dysuria  [ ]   hematuria PSYCHIATRIC:  [ ]  history of major depression INTEGUMENTARY:  [ ]  rashes  [ ]  ulcers CONSTITUTIONAL:  [ ]  fever   [ ]  chills  PHYSICAL EXAM: Filed Vitals:   10/04/14 1550  BP: 159/82  Pulse: 86  Temp: 98 F (36.7 C)  TempSrc: Oral  Resp: 16  Height: 6\' 1"  (1.854 m)  Weight: 228 lb (103.42 kg)  SpO2: 96%   GENERAL: The patient is a well-nourished male, in no acute distress. The vital  signs are documented above. CARDIOVASCULAR: There is a regular rate and rhythm. He has a palpable radial pulse bilaterally. He has an excellent thrill in his left upper arm fistula.  The fistula is not pulsatile. PULMONARY: There is good air exchange bilaterally without wheezing or rales. NEUROLOGIC: No focal weakness or paresthesias are detected. SKIN: There are no ulcers or rashes noted. PSYCHIATRIC: The patient has a normal affect.  MEDICAL ISSUES:  MILD STEAL SYMPTOMS LEFT UPPER EXTREMITY: The patient feels that his symptoms in the left upper extremity are tolerable and have improved since his fistula was banded. He does not want to consider new access in the right arm at this time as he feels he does not want to compromise his other arm given the problems she's had on the left. I do not think this is unreasonable. Given the risk of steal Dr. Trula Slade had recommended a preoperative arteriogram on the right if he ultimately does require new access in the right arm. However currently he does not wish to consider new access in the right arm. If his symptoms progress he seems amenable to reconsidering this. We will see him back as needed.   [place] [facility] [place] Beeper: [phone]

## 2014-10-10 LAB — PROTIME-INR: INR: 3.2 — AB (ref 0.9–1.1)

## 2014-10-11 ENCOUNTER — Ambulatory Visit (INDEPENDENT_AMBULATORY_CARE_PROVIDER_SITE_OTHER): Payer: 59 | Admitting: Cardiovascular Disease

## 2014-10-11 DIAGNOSIS — Z7901 Long term (current) use of anticoagulants: Secondary | ICD-10-CM

## 2014-10-11 DIAGNOSIS — I4891 Unspecified atrial fibrillation: Secondary | ICD-10-CM

## 2014-10-28 ENCOUNTER — Ambulatory Visit: Payer: 59 | Admitting: Internal Medicine

## 2014-10-29 LAB — PROTIME-INR: INR: 3.6 — AB (ref 0.9–1.1)

## 2014-10-30 ENCOUNTER — Ambulatory Visit (INDEPENDENT_AMBULATORY_CARE_PROVIDER_SITE_OTHER): Payer: 59 | Admitting: Cardiology

## 2014-10-30 DIAGNOSIS — Z7901 Long term (current) use of anticoagulants: Secondary | ICD-10-CM

## 2014-10-30 DIAGNOSIS — I4891 Unspecified atrial fibrillation: Secondary | ICD-10-CM

## 2014-11-12 LAB — PROTIME-INR: INR: 2.3 — AB (ref <=1.1)

## 2014-11-15 ENCOUNTER — Ambulatory Visit (INDEPENDENT_AMBULATORY_CARE_PROVIDER_SITE_OTHER): Payer: 59 | Admitting: Cardiology

## 2014-11-15 DIAGNOSIS — I4891 Unspecified atrial fibrillation: Secondary | ICD-10-CM

## 2014-11-15 DIAGNOSIS — Z7901 Long term (current) use of anticoagulants: Secondary | ICD-10-CM

## 2014-11-26 LAB — PROTIME-INR: INR: 2.4 — AB (ref 0.9–1.1)

## 2014-11-27 ENCOUNTER — Ambulatory Visit (INDEPENDENT_AMBULATORY_CARE_PROVIDER_SITE_OTHER): Payer: 59 | Admitting: Internal Medicine

## 2014-11-27 DIAGNOSIS — Z7901 Long term (current) use of anticoagulants: Secondary | ICD-10-CM

## 2014-11-27 DIAGNOSIS — I4891 Unspecified atrial fibrillation: Secondary | ICD-10-CM

## 2014-12-03 ENCOUNTER — Other Ambulatory Visit: Payer: Self-pay | Admitting: Internal Medicine

## 2014-12-03 ENCOUNTER — Other Ambulatory Visit: Payer: Self-pay | Admitting: Endocrinology

## 2014-12-09 ENCOUNTER — Other Ambulatory Visit: Payer: Self-pay

## 2014-12-10 LAB — PROTIME-INR: INR: 4.2 — AB (ref 0.9–1.1)

## 2014-12-11 ENCOUNTER — Ambulatory Visit (INDEPENDENT_AMBULATORY_CARE_PROVIDER_SITE_OTHER): Payer: 59 | Admitting: Cardiology

## 2014-12-11 DIAGNOSIS — Z7901 Long term (current) use of anticoagulants: Secondary | ICD-10-CM

## 2014-12-11 DIAGNOSIS — I4891 Unspecified atrial fibrillation: Secondary | ICD-10-CM

## 2014-12-30 ENCOUNTER — Encounter: Payer: Self-pay | Admitting: Cardiology

## 2014-12-30 ENCOUNTER — Encounter (HOSPITAL_COMMUNITY): Payer: Self-pay | Admitting: *Deleted

## 2014-12-30 DIAGNOSIS — I255 Ischemic cardiomyopathy: Secondary | ICD-10-CM

## 2014-12-30 DIAGNOSIS — I4430 Unspecified atrioventricular block: Secondary | ICD-10-CM | POA: Diagnosis present

## 2014-12-30 DIAGNOSIS — R1084 Generalized abdominal pain: Secondary | ICD-10-CM | POA: Diagnosis not present

## 2014-12-30 DIAGNOSIS — R931 Abnormal findings on diagnostic imaging of heart and coronary circulation: Secondary | ICD-10-CM | POA: Diagnosis present

## 2014-12-30 DIAGNOSIS — F17200 Nicotine dependence, unspecified, uncomplicated: Secondary | ICD-10-CM | POA: Diagnosis present

## 2014-12-30 DIAGNOSIS — Z515 Encounter for palliative care: Secondary | ICD-10-CM

## 2014-12-30 DIAGNOSIS — I214 Non-ST elevation (NSTEMI) myocardial infarction: Secondary | ICD-10-CM | POA: Diagnosis present

## 2014-12-30 DIAGNOSIS — I739 Peripheral vascular disease, unspecified: Secondary | ICD-10-CM | POA: Diagnosis present

## 2014-12-30 DIAGNOSIS — I25119 Atherosclerotic heart disease of native coronary artery with unspecified angina pectoris: Secondary | ICD-10-CM | POA: Diagnosis present

## 2014-12-30 DIAGNOSIS — Z87891 Personal history of nicotine dependence: Secondary | ICD-10-CM

## 2014-12-30 DIAGNOSIS — I340 Nonrheumatic mitral (valve) insufficiency: Secondary | ICD-10-CM | POA: Diagnosis not present

## 2014-12-30 DIAGNOSIS — I25118 Atherosclerotic heart disease of native coronary artery with other forms of angina pectoris: Secondary | ICD-10-CM | POA: Diagnosis not present

## 2014-12-30 DIAGNOSIS — I5023 Acute on chronic systolic (congestive) heart failure: Secondary | ICD-10-CM | POA: Diagnosis not present

## 2014-12-30 DIAGNOSIS — IMO0001 Reserved for inherently not codable concepts without codable children: Secondary | ICD-10-CM | POA: Diagnosis present

## 2014-12-30 DIAGNOSIS — I70229 Atherosclerosis of native arteries of extremities with rest pain, unspecified extremity: Secondary | ICD-10-CM | POA: Diagnosis present

## 2014-12-30 DIAGNOSIS — I272 Other secondary pulmonary hypertension: Secondary | ICD-10-CM | POA: Diagnosis present

## 2014-12-30 DIAGNOSIS — I471 Supraventricular tachycardia, unspecified: Secondary | ICD-10-CM | POA: Diagnosis present

## 2014-12-30 DIAGNOSIS — Z7189 Other specified counseling: Secondary | ICD-10-CM | POA: Diagnosis not present

## 2014-12-30 DIAGNOSIS — R778 Other specified abnormalities of plasma proteins: Secondary | ICD-10-CM

## 2014-12-30 DIAGNOSIS — Z89519 Acquired absence of unspecified leg below knee: Secondary | ICD-10-CM

## 2014-12-30 DIAGNOSIS — Z794 Long term (current) use of insulin: Secondary | ICD-10-CM

## 2014-12-30 DIAGNOSIS — N186 End stage renal disease: Secondary | ICD-10-CM

## 2014-12-30 DIAGNOSIS — Z7901 Long term (current) use of anticoagulants: Secondary | ICD-10-CM | POA: Insufficient documentation

## 2014-12-30 DIAGNOSIS — E118 Type 2 diabetes mellitus with unspecified complications: Secondary | ICD-10-CM | POA: Diagnosis present

## 2014-12-30 DIAGNOSIS — R0602 Shortness of breath: Secondary | ICD-10-CM | POA: Diagnosis present

## 2014-12-30 DIAGNOSIS — Z66 Do not resuscitate: Secondary | ICD-10-CM | POA: Diagnosis present

## 2014-12-30 DIAGNOSIS — Z992 Dependence on renal dialysis: Secondary | ICD-10-CM

## 2014-12-30 DIAGNOSIS — Z85828 Personal history of other malignant neoplasm of skin: Secondary | ICD-10-CM

## 2014-12-30 DIAGNOSIS — I350 Nonrheumatic aortic (valve) stenosis: Secondary | ICD-10-CM | POA: Diagnosis present

## 2014-12-30 DIAGNOSIS — E785 Hyperlipidemia, unspecified: Secondary | ICD-10-CM | POA: Diagnosis present

## 2014-12-30 DIAGNOSIS — I2510 Atherosclerotic heart disease of native coronary artery without angina pectoris: Secondary | ICD-10-CM | POA: Diagnosis present

## 2014-12-30 DIAGNOSIS — A419 Sepsis, unspecified organism: Secondary | ICD-10-CM | POA: Diagnosis not present

## 2014-12-30 DIAGNOSIS — I998 Other disorder of circulatory system: Secondary | ICD-10-CM | POA: Diagnosis present

## 2014-12-30 DIAGNOSIS — E1165 Type 2 diabetes mellitus with hyperglycemia: Secondary | ICD-10-CM | POA: Diagnosis present

## 2014-12-30 DIAGNOSIS — I5021 Acute systolic (congestive) heart failure: Secondary | ICD-10-CM | POA: Diagnosis present

## 2014-12-30 DIAGNOSIS — A0472 Enterocolitis due to Clostridium difficile, not specified as recurrent: Secondary | ICD-10-CM | POA: Insufficient documentation

## 2014-12-30 DIAGNOSIS — E1151 Type 2 diabetes mellitus with diabetic peripheral angiopathy without gangrene: Secondary | ICD-10-CM | POA: Diagnosis present

## 2014-12-30 DIAGNOSIS — I429 Cardiomyopathy, unspecified: Secondary | ICD-10-CM | POA: Diagnosis not present

## 2014-12-30 DIAGNOSIS — I4821 Permanent atrial fibrillation: Secondary | ICD-10-CM | POA: Diagnosis present

## 2014-12-30 DIAGNOSIS — I351 Nonrheumatic aortic (valve) insufficiency: Secondary | ICD-10-CM | POA: Diagnosis not present

## 2014-12-30 DIAGNOSIS — E119 Type 2 diabetes mellitus without complications: Secondary | ICD-10-CM

## 2014-12-30 DIAGNOSIS — D649 Anemia, unspecified: Secondary | ICD-10-CM | POA: Diagnosis present

## 2014-12-30 DIAGNOSIS — R109 Unspecified abdominal pain: Secondary | ICD-10-CM

## 2014-12-30 DIAGNOSIS — Z833 Family history of diabetes mellitus: Secondary | ICD-10-CM

## 2014-12-30 DIAGNOSIS — E1142 Type 2 diabetes mellitus with diabetic polyneuropathy: Secondary | ICD-10-CM | POA: Diagnosis present

## 2014-12-30 DIAGNOSIS — R7989 Other specified abnormal findings of blood chemistry: Secondary | ICD-10-CM

## 2014-12-30 DIAGNOSIS — K219 Gastro-esophageal reflux disease without esophagitis: Secondary | ICD-10-CM | POA: Diagnosis present

## 2014-12-30 DIAGNOSIS — A047 Enterocolitis due to Clostridium difficile: Secondary | ICD-10-CM | POA: Diagnosis not present

## 2014-12-30 DIAGNOSIS — Z89512 Acquired absence of left leg below knee: Secondary | ICD-10-CM

## 2014-12-30 DIAGNOSIS — I482 Chronic atrial fibrillation: Secondary | ICD-10-CM | POA: Diagnosis not present

## 2014-12-30 DIAGNOSIS — I441 Atrioventricular block, second degree: Secondary | ICD-10-CM | POA: Diagnosis present

## 2014-12-30 DIAGNOSIS — L97909 Non-pressure chronic ulcer of unspecified part of unspecified lower leg with unspecified severity: Secondary | ICD-10-CM | POA: Diagnosis present

## 2014-12-30 DIAGNOSIS — J449 Chronic obstructive pulmonary disease, unspecified: Secondary | ICD-10-CM | POA: Diagnosis present

## 2014-12-30 DIAGNOSIS — L97519 Non-pressure chronic ulcer of other part of right foot with unspecified severity: Secondary | ICD-10-CM | POA: Diagnosis present

## 2014-12-30 DIAGNOSIS — I10 Essential (primary) hypertension: Secondary | ICD-10-CM | POA: Diagnosis present

## 2014-12-30 DIAGNOSIS — I38 Endocarditis, valve unspecified: Secondary | ICD-10-CM | POA: Diagnosis not present

## 2014-12-30 DIAGNOSIS — I120 Hypertensive chronic kidney disease with stage 5 chronic kidney disease or end stage renal disease: Secondary | ICD-10-CM | POA: Diagnosis present

## 2014-12-30 HISTORY — DX: Nonrheumatic aortic (valve) stenosis: I35.0

## 2014-12-30 HISTORY — DX: Atherosclerotic heart disease of native coronary artery without angina pectoris: I25.10

## 2014-12-30 HISTORY — DX: Ischemic cardiomyopathy: I25.5

## 2014-12-30 HISTORY — DX: Acquired absence of unspecified leg below knee: Z89.519

## 2014-12-30 LAB — COMPREHENSIVE METABOLIC PANEL
ALT: 14 U/L — ABNORMAL LOW (ref 17–63)
AST: 17 U/L (ref 15–41)
Albumin: 3.2 g/dL — ABNORMAL LOW (ref 3.5–5.0)
Alkaline Phosphatase: 88 U/L (ref 38–126)
Anion gap: 16 — ABNORMAL HIGH (ref 5–15)
BUN: 34 mg/dL — ABNORMAL HIGH (ref 6–20)
CO2: 23 mmol/L (ref 22–32)
Calcium: 9.2 mg/dL (ref 8.9–10.3)
Chloride: 95 mmol/L — ABNORMAL LOW (ref 101–111)
Creatinine, Ser: 6.86 mg/dL — ABNORMAL HIGH (ref 0.61–1.24)
GFR calc Af Amer: 9 mL/min — ABNORMAL LOW (ref >60)
GFR calc non Af Amer: 8 mL/min — ABNORMAL LOW (ref >60)
Glucose, Bld: 212 mg/dL — ABNORMAL HIGH (ref 65–99)
Potassium: 5.7 mmol/L — ABNORMAL HIGH (ref 3.5–5.1)
Sodium: 134 mmol/L — ABNORMAL LOW (ref 135–145)
Total Bilirubin: 1.1 mg/dL (ref 0.3–1.2)
Total Protein: 6.8 g/dL (ref 6.5–8.1)

## 2014-12-30 LAB — GLUCOSE, CAPILLARY
GLUCOSE-CAPILLARY: 278 mg/dL — AB (ref 65–99)
Glucose-Capillary: 194 mg/dL — ABNORMAL HIGH (ref 65–99)

## 2014-12-30 LAB — CBC WITH DIFFERENTIAL/PLATELET
Basophils Absolute: 0.1 10*3/uL (ref 0.0–0.1)
Basophils Relative: 1 % (ref 0–1)
Eosinophils Absolute: 0.2 10*3/uL (ref 0.0–0.7)
Eosinophils Relative: 2 % (ref 0–5)
HCT: 39.6 % (ref 39.0–52.0)
Hemoglobin: 12.5 g/dL — ABNORMAL LOW (ref 13.0–17.0)
Lymphocytes Relative: 17 % (ref 12–46)
Lymphs Abs: 1.5 10*3/uL (ref 0.7–4.0)
MCH: 30.5 pg (ref 26.0–34.0)
MCHC: 31.6 g/dL (ref 30.0–36.0)
MCV: 96.6 fL (ref 78.0–100.0)
Monocytes Absolute: 0.8 10*3/uL (ref 0.1–1.0)
Monocytes Relative: 9 % (ref 3–12)
Neutro Abs: 6.4 10*3/uL (ref 1.7–7.7)
Neutrophils Relative %: 71 % (ref 43–77)
Platelets: 302 10*3/uL (ref 150–400)
RBC: 4.1 MIL/uL — ABNORMAL LOW (ref 4.22–5.81)
RDW: 16.8 % — ABNORMAL HIGH (ref 11.5–15.5)
WBC: 9.1 10*3/uL (ref 4.0–10.5)

## 2014-12-30 LAB — PROTIME-INR
INR: 1.4 (ref 0.00–1.49)
Prothrombin Time: 17.3 seconds — ABNORMAL HIGH (ref 11.6–15.2)

## 2014-12-30 LAB — TSH: TSH: 1.129 u[IU]/mL (ref 0.350–4.500)

## 2014-12-30 LAB — MRSA PCR SCREENING: MRSA by PCR: NEGATIVE

## 2014-12-30 LAB — HEPARIN LEVEL (UNFRACTIONATED): Heparin Unfractionated: 0.25 IU/mL — ABNORMAL LOW (ref 0.30–0.70)

## 2014-12-30 LAB — TROPONIN I: Troponin I: 0.15 ng/mL — ABNORMAL HIGH (ref <0.031)

## 2014-12-30 MED ORDER — SODIUM CHLORIDE 0.9 % IJ SOLN
3.0000 mL | Freq: Two times a day (BID) | INTRAMUSCULAR | Status: DC
  Administered 2014-12-31 – 2015-01-03 (×4): 3 mL via INTRAVENOUS

## 2014-12-30 MED ORDER — NITROGLYCERIN 0.4 MG SL SUBL
0.4000 mg | SUBLINGUAL_TABLET | SUBLINGUAL | Status: DC | PRN
  Administered 2015-01-01: 0.4 mg via SUBLINGUAL
  Filled 2014-12-30: qty 1

## 2014-12-30 MED ORDER — LOPERAMIDE HCL 2 MG PO CAPS
2.0000 mg | ORAL_CAPSULE | Freq: Every day | ORAL | Status: DC | PRN
  Administered 2015-01-01: 2 mg via ORAL
  Filled 2014-12-30: qty 1

## 2014-12-30 MED ORDER — PANTOPRAZOLE SODIUM 40 MG PO TBEC
40.0000 mg | DELAYED_RELEASE_TABLET | Freq: Two times a day (BID) | ORAL | Status: DC
  Administered 2014-12-30 – 2015-01-02 (×6): 40 mg via ORAL
  Filled 2014-12-30 (×7): qty 1

## 2014-12-30 MED ORDER — POLYETHYLENE GLYCOL 3350 17 G PO PACK: 17.0000 g | PACK | Freq: Every day | ORAL | Status: DC | PRN

## 2014-12-30 MED ORDER — CETYLPYRIDINIUM CHLORIDE 0.05 % MT LIQD
7.0000 mL | Freq: Two times a day (BID) | OROMUCOSAL | Status: DC
  Administered 2014-12-30 – 2015-01-03 (×8): 7 mL via OROMUCOSAL

## 2014-12-30 MED ORDER — CALCIUM ACETATE 667 MG PO CAPS: 1334.0000 mg | ORAL_CAPSULE | Freq: Three times a day (TID) | ORAL | Status: DC

## 2014-12-30 MED ORDER — ZOLPIDEM TARTRATE 5 MG PO TABS: 5.0000 mg | ORAL_TABLET | Freq: Every evening | ORAL | Status: DC | PRN

## 2014-12-30 MED ORDER — ONDANSETRON HCL 4 MG/2ML IJ SOLN: 4.0000 mg | Freq: Four times a day (QID) | INTRAMUSCULAR | Status: DC | PRN

## 2014-12-30 MED ORDER — TEMAZEPAM 15 MG PO CAPS: 30.0000 mg | ORAL_CAPSULE | Freq: Every evening | ORAL | Status: DC | PRN

## 2014-12-30 MED ORDER — HYDROCODONE-ACETAMINOPHEN 5-325 MG PO TABS
1.0000 | ORAL_TABLET | Freq: Four times a day (QID) | ORAL | Status: DC | PRN
  Administered 2014-12-30 – 2015-01-02 (×4): 1 via ORAL
  Filled 2014-12-30 (×5): qty 1

## 2014-12-30 MED ORDER — INSULIN ASPART 100 UNIT/ML ~~LOC~~ SOLN: 0.0000 [IU] | Freq: Three times a day (TID) | SUBCUTANEOUS | Status: DC

## 2014-12-30 MED ORDER — INSULIN ASPART 100 UNIT/ML ~~LOC~~ SOLN: 3.0000 [IU] | Freq: Three times a day (TID) | SUBCUTANEOUS | Status: DC

## 2014-12-30 MED ORDER — HEPARIN (PORCINE) IN NACL 100-0.45 UNIT/ML-% IJ SOLN
2400.0000 [IU]/h | INTRAMUSCULAR | Status: DC
  Administered 2014-12-30 – 2014-12-31 (×2): 2400 [IU]/h via INTRAVENOUS
  Filled 2014-12-30 (×2): qty 250

## 2014-12-30 MED ORDER — SODIUM CHLORIDE 0.9 % IV SOLN: 250.0000 mL | INTRAVENOUS | Status: DC | PRN

## 2014-12-30 MED ORDER — CALCIUM ACETATE (PHOS BINDER) 667 MG PO CAPS
1334.0000 mg | ORAL_CAPSULE | Freq: Three times a day (TID) | ORAL | Status: DC
  Administered 2014-12-31 – 2015-01-03 (×6): 1334 mg via ORAL
  Filled 2014-12-30 (×16): qty 2

## 2014-12-30 MED ORDER — HEPARIN (PORCINE) IN NACL 100-0.45 UNIT/ML-% IJ SOLN
INTRAMUSCULAR | Status: AC
  Administered 2014-12-30: 25000 [IU]
  Filled 2014-12-30: qty 250

## 2014-12-30 MED ORDER — RENA-VITE PO TABS
1.0000 | ORAL_TABLET | Freq: Every day | ORAL | Status: DC
  Administered 2014-12-30 – 2015-01-04 (×6): 1 via ORAL
  Filled 2014-12-30 (×7): qty 1

## 2014-12-30 MED ORDER — TIOTROPIUM BROMIDE MONOHYDRATE 18 MCG IN CAPS
18.0000 ug | ORAL_CAPSULE | Freq: Every day | RESPIRATORY_TRACT | Status: DC
  Administered 2014-12-31 – 2015-01-04 (×6): 18 ug via RESPIRATORY_TRACT
  Filled 2014-12-30 (×2): qty 5

## 2014-12-30 MED ORDER — CINACALCET HCL 30 MG PO TABS
30.0000 mg | ORAL_TABLET | Freq: Every day | ORAL | Status: DC
  Administered 2014-12-30 – 2015-01-03 (×5): 30 mg via ORAL
  Filled 2014-12-30 (×7): qty 1

## 2014-12-30 MED ORDER — DIALYVITE 3000 3 MG PO TABS: 1.0000 | ORAL_TABLET | Freq: Every day | ORAL | Status: DC

## 2014-12-30 MED ORDER — ALBUTEROL SULFATE (2.5 MG/3ML) 0.083% IN NEBU
2.5000 mg | INHALATION_SOLUTION | RESPIRATORY_TRACT | Status: DC | PRN
  Administered 2015-01-04: 2.5 mg via RESPIRATORY_TRACT

## 2014-12-30 MED ORDER — ASPIRIN EC 81 MG PO TBEC
81.0000 mg | DELAYED_RELEASE_TABLET | Freq: Every day | ORAL | Status: DC
  Administered 2014-12-31: 81 mg via ORAL
  Filled 2014-12-30: qty 1

## 2014-12-30 MED ORDER — ALBUTEROL SULFATE HFA 108 (90 BASE) MCG/ACT IN AERS: 2.0000 | INHALATION_SPRAY | RESPIRATORY_TRACT | Status: DC | PRN

## 2014-12-30 MED ORDER — INSULIN ASPART 100 UNIT/ML ~~LOC~~ SOLN
0.0000 [IU] | Freq: Every day | SUBCUTANEOUS | Status: DC
  Administered 2014-12-30: 3 [IU] via SUBCUTANEOUS

## 2014-12-30 MED ORDER — SODIUM CHLORIDE 0.9 % IJ SOLN: 3.0000 mL | INTRAMUSCULAR | Status: DC | PRN

## 2014-12-30 MED ORDER — INSULIN GLARGINE 100 UNIT/ML ~~LOC~~ SOLN
15.0000 [IU] | Freq: Two times a day (BID) | SUBCUTANEOUS | Status: DC
  Filled 2014-12-30 (×2): qty 0.15

## 2014-12-30 MED ORDER — ACETAMINOPHEN 325 MG PO TABS: 650.0000 mg | ORAL_TABLET | ORAL | Status: DC | PRN

## 2014-12-30 MED ORDER — SODIUM CHLORIDE 0.9 % IV SOLN: INTRAVENOUS | Status: DC

## 2014-12-30 MED ORDER — GABAPENTIN 300 MG PO CAPS
300.0000 mg | ORAL_CAPSULE | Freq: Every day | ORAL | Status: DC
  Administered 2014-12-30 – 2015-01-04 (×6): 300 mg via ORAL
  Filled 2014-12-30 (×7): qty 1

## 2014-12-30 MED ORDER — METOPROLOL TARTRATE 12.5 MG HALF TABLET
12.5000 mg | ORAL_TABLET | Freq: Two times a day (BID) | ORAL | Status: DC
Start: 2014-12-30 — End: 2015-01-05
  Administered 2014-12-30 – 2015-01-05 (×9): 12.5 mg via ORAL
  Filled 2014-12-30 (×13): qty 1

## 2014-12-30 MED ORDER — ALPRAZOLAM 0.25 MG PO TABS
0.2500 mg | ORAL_TABLET | Freq: Two times a day (BID) | ORAL | Status: DC | PRN
  Administered 2014-12-30 – 2015-01-05 (×2): 0.25 mg via ORAL
  Filled 2014-12-30 (×2): qty 1

## 2014-12-30 NOTE — H&P (Signed)
Patient ID: [name] MRN: [id number], DOB/AGE: [birth year]   Admit date: 01/03/2015   Primary Physician: Perry Shin, MD Primary Cardiologist: Dr [name]  HPI: Pleasant 64 y/o Caucasian male followed by Dr [name] with multiple medical problems. The pt has had PAT and had RFA in 2011. He now has permanent atrial fibrillation and is on Coumadin.  He had a cath in 2010 that showed no critical CAD and a Myoview in 2013 (pre op renal transplant evaluation) that was low risk. An echo done in 2013 showed normal LVF and no AS. He is a dialysis pt and is followed by Dr [name]. He has DM and has had Lt BKA. 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   He has a condo at [facility] and was there last week with his family. He went to HD there Tuesday with no problem. When he returned Thursday he developed hypotension and hemoptysis. He was admitted to [facility] where his Troponin was elevated at 0.2. He had an echo and Myoview there. Echo showed "severe AS with AVA of 0.99, RV dysfunction, and an EF of 15%. Myoview reportedly showed CFX ischemia. The pt was then transferred to [facility] in [place] for cath. His Coumadin was held. It was felt he should be cleared from a GI standpoint before cath since he had hematemesis on presentation. This was done at [facility] and showed a polyp but no ulcers. He was cleared for cath. Over the weekend it was decided to transfer the pt to [facility] for this.   He denies chest pain but admits to increasing fatigue the last few weeks. He says he is now fine laying flat but gets dizzy and SOB when sitting up. His EKG is similar to past EKGs though the lat TWI now is more pronounced.    Problem List: Past Medical History  Diagnosis Date  . GERD (gastroesophageal reflux disease)   . Hypertension   . Neuromuscular disorder     neurophathy  . Atrial fibrillation     chronically  anticoagulated with coumadin  . Hyperlipidemia   . Atrial flutter 07/07/2009    s/p ablation  . GOUT 08/01/2008  . ERECTILE DYSFUNCTION 08/01/2008  . PERIPHERAL NEUROPATHY 08/01/2008  . CORONARY ARTERY DISEASE 08/01/2008    non obstructive per cath 1/10; Dr. Olevia [name]  . 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 Gastroparesis 08/01/2008  . NEPHROLITHIASIS, HX OF 08/01/2008  . Chronic systolic heart failure 02/14/7901  . COPD 07/21/2010  . Diverticulosis of colon (without mention of hemorrhage) 10/28/2010  . Benign neoplasm of colon 10/28/2010  . Hemorrhoids, internal 10/28/2010  . Second degree Mobitz I AV block     asymptomatic  . Cyst     "I've had 4-5; pretty much all of them have got infected" (06/13/2013)  . Muscle spasm of back     back muscle spasms and pt is unable to lay flat   . Abscess of back     lower middle portion of back  . Peripheral vascular disease     poor circulation  . Pneumonia     "first time I've been dx'd was today" (06/13/2013)  . Shortness of breath     "just related to this pneumonia" (06/13/2013)  . Type II diabetes mellitus dx'd 1980's  . Migraines     "none since the 1980's" (06/13/2013)  .  End stage renal failure on dialysis 08/01/2008    "[place]; TTS" (06/13/2013)  . Renal cyst     bilateral  . Basal cell carcinoma     nose  . Depression     situaltional     Past Surgical History  Procedure Laterality Date  . Atrial ablation surgery  07-2008, 07/08/09    Dr [name]  . Foot surgery Right 09-2009    infection got into the bone; had to put rod in to hold foot together" (06/13/2013)  . Colonoscopy  2011?    Dr.Orr  . Av fistula placement Left 2010    upper arm  . Elbow fracture surgery Right 1974  . Pilonidal cyst excision  1971  . Cataract extraction Right ~ 2012  . I&d extremity Left 12/29/2012    Procedure: IRRIGATION AND DEBRIDEMENT EXTREMITY;  Surgeon: Perry Minion, MD;  Location: [place];  Service: Orthopedics;  Laterality:  Left;  Irrigation and Debridement Left Leg, VAC, [place]  . Toe amputation Right 01/18/2013    "little toe" Dr [name]  . I&d extremity Bilateral 01/18/2013    Procedure: IRRIGATION AND DEBRIDEMENT EXTREMITYLEFT LEG AND RIGHT FOOT;  Surgeon: Perry Minion, MD;  Location: [place];  Service: Orthopedics;  Laterality: Bilateral;  Left Leg Excisional Debridement  . Skin graft Left     left leg within the past month  . Amputation Left 02/02/2013    Procedure: AMPUTATION BELOW KNEE;  Surgeon: Perry Minion, MD;  Location: [place];  Service: Orthopedics;  Laterality: Left;  Left Below Knee Amputation  . Fracture surgery    . Vasectomy    . Amputation Left 03/23/2013    Procedure: REVISION OF BELOW THE KNEE  AMPUTATION ;  Surgeon: Perry Minion, MD;  Location: [place];  Service: Orthopedics;  Laterality: Left;  Left Below Knee Amputation Revision  . I&d extremity Left 05/04/2013    Procedure: Irrigation and Debridement Left Below Knee Ampuation;  Surgeon: Perry Minion, MD;  Location: [place];  Service: Orthopedics;  Laterality: Left;  Irrigation and Debridement Left Below Knee Ampuation  . Amputation Left 05/30/2013    Procedure: REVISION AMPUTATION BELOW KNEE;  Surgeon: Perry Minion, MD;  Location: [place];  Service: Orthopedics;  Laterality: Left;  Revision Left Below Knee Amputation, Possible Above Knee Amputation  . Tonsillectomy    . Amputation Left 02/13/2014    Procedure: Left Long Finger Amputation PIP Joint;  Surgeon: Perry Minion, MD;  Location: [place];  Service: Orthopedics;  Laterality: Left;  . Revison of arteriovenous fistula Left 29/47/6546    Procedure: PLICATION, RESECTION & BANDING ,OF LEFT UPPER ARM BASILIC VEIN TRANSPOSITION;  Surgeon: Perry Mitchell, MD;  Location: [place];  Service: Vascular;  Laterality: Left;     Allergies: No Known Allergies   Home Medications No current facility-administered medications for this encounter.     Family History  Problem Relation Age of Onset  .  Diabetes Maternal Grandmother   . Diabetes Paternal Grandmother   . Cancer Neg Hx     no FH of Colon Cancer  . Diabetes Father      History   Social History  . Marital Status: Married    Spouse Name: N/A  . Number of Children: 3  . Years of Education: N/A   Occupational History  . Sales Rep    Social History Main Topics  . Smoking status: Former Smoker -- 0.50 packs/day for 44 years  Types: Cigarettes    Quit date: 12/09/2014  . Smokeless tobacco: Never Used  . Alcohol Use: No     Comment: 06/13/2013 "no alcohol in 40 yrs"  . Drug Use: No     Comment: 06/13/2013 "used marijuana years ago"  . Sexual Activity: Not Currently   Other Topics Concern  . 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[employer]           Review of Systems: General: negative for chills, fever, night sweats or weight changes.  Cardiovascular: negative for chest pain, edema, orthopnea, palpitations, paroxysmal nocturnal dyspnea or shortness of breath Dermatological: negative for rash Respiratory: negative for cough or wheezing Urologic: negative for hematuria Abdominal: negative for nausea, vomiting, diarrhea, bright red blood per rectum, melena, or hematemesis Neurologic: negative for visual changes, syncope All other systems reviewed and are otherwise negative except as noted above.  Physical Exam: Blood pressure 114/43, pulse 41, temperature 97.9 F (36.6 C), temperature source Oral, resp. rate 17, height 6' (1.829 m), weight 250 lb 14.1 oz (113.8 kg), SpO2 100 %.  General appearance: alert, cooperative, no distress and moderately obese Neck: no carotid bruit and no JVD Lungs: decreases breath sounds Heart: irregularly irregular rhythm and soft systolic murmur AOV Abdomen: obese, non tender Extremities: Lt BKA. Rt foot, secopnd toe has black tip Pulses: diminnished distal pulses Skin: pale cool dry Neurologic: Grossly normal    Labs, CXR, EKG-  pending   ASSESSMENT AND PLAN:  Active Problems:   Aortic stenosis   Abnormal nuclear cardiac imaging test   Cardiomyopathy- etiology to be determined   Type 2 diabetes with complication   CAD- non obstructive 2010   Atrial fibrillation   Permanent atrial fibrillation   ESRD on dialysis   Warfarin anticoagulation   Essential hypertension   SVT/ PSVT/ PAT   Ulcer of Rt foot- Dr [name] follows   Smoking   PVD- Lt BKA 2014   PLAN: Check INR, his Coumadin has been held and he is on IV Heparin. Check echo. He is on the schedule for Rt and Lt cath tomorrow but will review labs and echo first. I have contacted  [facility] and asked for an internal medicine consult to help with his DM.    Henri Medal, PA-C 12/29/2014, 5:05 PM [phone] Patient seen and examined. I agree with the assessment and plan as detailed above. See also my additional thoughts below.   I personally reviewed the patient's complete prior history. I have also reviewed the records from the outside hospitals. Unfortunately, we do not have a copy of the actual echo report that was done at the first hospital. This was read as severe left ventricular dysfunction and severe aortic stenosis. 2-D echo will be repeated early tomorrow morning to gather more information. We know from 2013 that the patient had normal LV function and a normal aortic valve. I have personally reviewed this study. He clearly had excellent function and a normal aortic valve at that time. With his end-stage renal disease, he could have rapidly progressing aortic stenosis. There is a systolic murmur. At this point it would seem unlikely that he has critical aortic stenosis causing left ventricular dysfunction. We will reassess his aortic valve by echo tomorrow before his catheterization. All of the reports from the outside hospitals suggests severe left ventricular dysfunction. This is new since his last studies in 2013 and 2014. He  says that on his nondialysis days he  can usually be active until approximately 4 weeks ago. Since that time he has had significant fatigue most of the time. It seems possible that he may have had an event affecting his LV function sometime in the past 4-5 weeks. He denies any significant chest pain. The echo was reported as showing severe left ventricular dysfunction and severe right ventricular dysfunction. The patient has been coumadinized. It seems unlikely that pulmonary emboli are the basis of his current problems.  We have notified the nephrology team that no him here in [place]. He has had incomplete dialysis on 2 occasions in the past few days. He is lying flat in bed at this time. It would seem that his volume status is stable for catheterization tomorrow. He has been off Coumadin and has been on heparin. His INR will be checked again.  The plan will be to reassess his LV and aortic valve by echo early tomorrow morning. Later in the morning he will have complete right and left heart cath. Further decisions about his care will then be made based on the data obtained. I had a complete discussion with the patient about this approach.  Dola Argyle, MD, [facility] 01/08/2015 5:09 PM

## 2014-12-30 NOTE — Progress Notes (Signed)
ANTICOAGULATION CONSULT NOTE - Initial Consult  Pharmacy Consult for heparin Indication: ACS/PAF history on warfarin pta  No Known Allergies  Patient Measurements: Height: 6' (182.9 cm) Weight: 250 lb 14.1 oz (113.8 kg) IBW/kg (Calculated) : 77.6 Heparin Dosing Weight: 102kg  Vital Signs: Temp: 97.9 F (36.6 C) (07/18 1610) Temp Source: Oral (07/18 1610) BP: 114/43 mmHg (07/18 1700) Pulse Rate: 41 (07/18 1700)  Labs: No results for input(s): HGB, HCT, PLT, APTT, LABPROT, INR, HEPARINUNFRC, CREATININE, CKTOTAL, CKMB, TROPONINI in the last 72 hours.  CrCl cannot be calculated (Patient has no serum creatinine result on file.).   Medical History: Past Medical History  Diagnosis Date  . GERD (gastroesophageal reflux disease)   . Hypertension   . Neuromuscular disorder     neurophathy  . Atrial fibrillation     chronically anticoagulated with coumadin  . Hyperlipidemia   . Atrial flutter 07/07/2009    s/p ablation  . GOUT 08/01/2008  . ERECTILE DYSFUNCTION 08/01/2008  . PERIPHERAL NEUROPATHY 08/01/2008  . CORONARY ARTERY DISEASE 08/01/2008    non obstructive per cath 1/10; Dr. Olevia Perches  . 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 Gastroparesis 08/01/2008  . NEPHROLITHIASIS, HX OF 08/01/2008  . Chronic systolic heart failure 09/20/1854  . COPD 07/21/2010  . Diverticulosis of colon (without mention of hemorrhage) 10/28/2010  . Benign neoplasm of colon 10/28/2010  . Hemorrhoids, internal 10/28/2010  . Second degree Mobitz I AV block     asymptomatic  . Cyst     "I've had 4-5; pretty much all of them have got infected" (06/13/2013)  . Muscle spasm of back     back muscle spasms and pt is unable to lay flat   . Abscess of back     lower middle portion of back  . Peripheral vascular disease     poor circulation  . Pneumonia     "first time I've been dx'd was today" (06/13/2013)  . Shortness of breath     "just related to this pneumonia" (06/13/2013)  . Type II  diabetes mellitus dx'd 1980's  . Migraines     "none since the 1980's" (06/13/2013)  . End stage renal failure on dialysis 08/01/2008    "[place]; TTS" (06/13/2013)  . Renal cyst     bilateral  . Basal cell carcinoma     nose  . Depression     situaltional     Assessment: 52 yom with ESRD - HD TTS, PAF on warfarin pta. ECHO at [facility] showed severe aortic stenosis with AVA 0.99, RV dysfunction, and EF 15%. Myoview - CFX ischemia. Transferred to [facility] for cath but felt he should be cleared from GI prior to cath due to hematemesis on admit. Cleared for cath but transferred to [facility] for procedure. Pharmacy consulted to dose heparin for ACS/PAF history (warfarin pta on hold). Patient was started on heparin drip prior to arrival, currently running at 2263 units/h per RN. Will check STAT HL and adjust dose as necessary. No labs yet. Last warfarin dose 7/13.   Goal of Therapy:  Heparin level 0.3-0.7 units/ml Monitor platelets by anticoagulation protocol: Yes   Plan:  Heparin currently @2263  units/h - started prior to arrival STAT HL Daily HL/CBC Mon s/sx bleeding  Elicia Lamp, PharmD Clinical Pharmacist Pager [phone] 12/13/2014 5:32 PM

## 2014-12-30 NOTE — Progress Notes (Signed)
ANTICOAGULATION CONSULT NOTE - Initial Consult  Pharmacy Consult for heparin Indication: ACS/PAF history on warfarin pta  No Known Allergies  Patient Measurements: Height: 6' (182.9 cm) Weight: 250 lb 14.1 oz (113.8 kg) IBW/kg (Calculated) : 77.6 Heparin Dosing Weight: 102kg  Vital Signs: Temp: 97.4 F (36.3 C) (07/18 2100) Temp Source: Oral (07/18 2100) BP: 148/116 mmHg (07/18 2100) Pulse Rate: 105 (07/18 2100)  Labs:  Recent Labs  12/16/2014 0555 12/19/2014 1755  HGB 12.5*  --   HCT 39.6  --   PLT 302  --   LABPROT 17.3*  --   INR 1.40  --   HEPARINUNFRC  --  0.25*  CREATININE 6.86*  --   TROPONINI 0.15*  --     Estimated Creatinine Clearance: 14.2 mL/min (by C-G formula based on Cr of 6.86).   Medical History: Past Medical History  Diagnosis Date  . GERD (gastroesophageal reflux disease)   . Hypertension   . Neuromuscular disorder     neurophathy  . Atrial fibrillation     chronically anticoagulated with coumadin  . Hyperlipidemia   . Atrial flutter 07/07/2009    s/p ablation  . GOUT 08/01/2008  . ERECTILE DYSFUNCTION 08/01/2008  . PERIPHERAL NEUROPATHY 08/01/2008  . CORONARY ARTERY DISEASE 08/01/2008    non obstructive per cath 1/10; Dr. Olevia Perches  . 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 Gastroparesis 08/01/2008  . NEPHROLITHIASIS, HX OF 08/01/2008  . Chronic systolic heart failure 12/17/2261  . COPD 07/21/2010  . Diverticulosis of colon (without mention of hemorrhage) 10/28/2010  . Benign neoplasm of colon 10/28/2010  . Hemorrhoids, internal 10/28/2010  . Second degree Mobitz I AV block     asymptomatic  . Cyst     "I've had 4-5; pretty much all of them have got infected" (06/13/2013)  . Muscle spasm of back     back muscle spasms and pt is unable to lay flat   . Abscess of back     lower middle portion of back  . Peripheral vascular disease     poor circulation  . Pneumonia     "first time I've been dx'd was today" (06/13/2013)  .  Shortness of breath     "just related to this pneumonia" (06/13/2013)  . Type II diabetes mellitus dx'd 1980's  . Migraines     "none since the 1980's" (06/13/2013)  . End stage renal failure on dialysis 08/01/2008    "[place]; TTS" (06/13/2013)  . Renal cyst     bilateral  . Basal cell carcinoma     nose  . Depression     situaltional     Assessment: 74 yom with ESRD - HD TTS, PAF on warfarin pta. ECHO at [facility] showed severe aortic stenosis with AVA 0.99, RV dysfunction, and EF 15%. Myoview - CFX ischemia. Transferred to [facility] for cath but felt he should be cleared from GI prior to cath due to hematemesis on admit. Cleared for cath but transferred to [facility] for procedure. Pharmacy consulted to dose heparin for ACS/PAF history (warfarin pta on hold). Patient was started on heparin drip prior to arrival. Hg 12.5, plt wnl on admit.  HL 0.25 on 2263 units/h (started prior to arrival)   Goal of Therapy:  Heparin level 0.3-0.7 units/ml Monitor platelets by anticoagulation protocol: Yes   Plan:  Increase Heparin to 2400 units/h 8h HL Daily HL/CBC Mon s/sx bleeding Cath tentatively planned for 7/19  Elicia Lamp, PharmD Clinical Pharmacist Pager [phone] 12/25/2014 9:30 PM

## 2014-12-30 NOTE — Consult Note (Signed)
Patient Demographics   [name], is a 64 y.o. male   MRN: [id number]   DOB - 05-12-1951  Admit Date - 12/24/2014    Outpatient Primary MD for the patient is Renato Shin, MD  Consult requested in the Hospital by Thompson Grayer, MD, On 01/02/2015    Reason for consult management of diabetes mellitus type 2     With History of -  Past Medical History  Diagnosis Date  . GERD (gastroesophageal reflux disease)   . Hypertension   . Neuromuscular disorder     neurophathy  . Atrial fibrillation     chronically anticoagulated with coumadin  . Hyperlipidemia   . Atrial flutter 07/07/2009    s/p ablation  . GOUT 08/01/2008  . ERECTILE DYSFUNCTION 08/01/2008  . PERIPHERAL NEUROPATHY 08/01/2008  . CORONARY ARTERY DISEASE 08/01/2008    non obstructive per cath 1/10; Dr. Olevia Perches  . 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 Gastroparesis 08/01/2008  . NEPHROLITHIASIS, HX OF 08/01/2008  . Chronic systolic heart failure 02/18/6733  . COPD 07/21/2010  . Diverticulosis of colon (without mention of hemorrhage) 10/28/2010  . Benign neoplasm of colon 10/28/2010  . Hemorrhoids, internal 10/28/2010  . Second degree Mobitz I AV block     asymptomatic  . Cyst     "I've had 4-5; pretty much all of them have got infected" (06/13/2013)  . Muscle spasm of back     back muscle spasms and pt is unable to lay flat   . Abscess of back     lower middle portion of back  . Peripheral vascular disease     poor circulation  . Pneumonia     "first time I've been dx'd was today" (06/13/2013)  . Shortness of breath     "just related to this pneumonia" (06/13/2013)  . Type II diabetes mellitus dx'd 1980's  . Migraines     "none since the 1980's" (06/13/2013)  . End stage renal failure on dialysis 08/01/2008    "[place];  TTS" (06/13/2013)  . Renal cyst     bilateral  . Basal cell carcinoma     nose  . Depression     situaltional       Past Surgical History  Procedure Laterality Date  . Atrial ablation surgery  07-2008, 07/08/09    Dr Rayann Heman  . Foot surgery Right 09-2009    infection got into the bone; had to put rod in to hold foot together" (06/13/2013)  . Colonoscopy  2011?    Dr.Orr  . Av fistula placement Left 2010    upper arm  . Elbow fracture surgery Right 1974  . Pilonidal cyst excision  1971  . Cataract extraction Right ~ 2012  . I&d extremity Left 12/29/2012    Procedure: IRRIGATION AND DEBRIDEMENT EXTREMITY;  Surgeon: Newt Minion, MD;  Location: [facility];  Service: Orthopedics;  Laterality: Left;  Irrigation and Debridement Left Leg, VAC, [place]  . Toe amputation Right  01/18/2013    "little toe" Dr Sharol Given  . I&d extremity Bilateral 01/18/2013    Procedure: IRRIGATION AND DEBRIDEMENT EXTREMITYLEFT LEG AND RIGHT FOOT;  Surgeon: Newt Minion, MD;  Location: [facility];  Service: Orthopedics;  Laterality: Bilateral;  Left Leg Excisional Debridement  . Skin graft Left     left leg within the past month  . Amputation Left 02/02/2013    Procedure: AMPUTATION BELOW KNEE;  Surgeon: Newt Minion, MD;  Location: [facility];  Service: Orthopedics;  Laterality: Left;  Left Below Knee Amputation  . Fracture surgery    . Vasectomy    . Amputation Left 03/23/2013    Procedure: REVISION OF BELOW THE KNEE  AMPUTATION ;  Surgeon: Newt Minion, MD;  Location: [facility];  Service: Orthopedics;  Laterality: Left;  Left Below Knee Amputation Revision  . I&d extremity Left 05/04/2013    Procedure: Irrigation and Debridement Left Below Knee Ampuation;  Surgeon: Newt Minion, MD;  Location: [facility];  Service: Orthopedics;  Laterality: Left;  Irrigation and Debridement Left Below Knee Ampuation  . Amputation Left 05/30/2013    Procedure: REVISION AMPUTATION BELOW KNEE;  Surgeon: Newt Minion, MD;  Location: [facility];  Service:  Orthopedics;  Laterality: Left;  Revision Left Below Knee Amputation, Possible Above Knee Amputation  . Tonsillectomy    . Amputation Left 02/13/2014    Procedure: Left Long Finger Amputation PIP Joint;  Surgeon: Newt Minion, MD;  Location: [facility];  Service: Orthopedics;  Laterality: Left;  . Revison of arteriovenous fistula Left 14/48/1856    Procedure: PLICATION, RESECTION & BANDING ,OF LEFT UPPER ARM BASILIC VEIN TRANSPOSITION;  Surgeon: Serafina Mitchell, MD;  Location: [facility];  Service: Vascular;  Laterality: Left;    in for   Critical AS and non-ST elevation MI    HPI    [name]  is a 64 y.o. male,  chronic atrial fibrillation on Coumadin, ESRD on dialysis, dyslipidemia, PAD status post left BKA by Dr. Sharol Given in the past, now being followed for right third toe ulcer, COPD, ongoing smoking counseled to quit, insulin-dependent type 2 diabetes mellitus, diabetic neuropathy, COPD, essential hypertension, gout, GERD.  Patient with above history who is from [facility] and follows with Dr. Rayann Heman cardiologist and Dr. Mercy Moore nephrologist was [place] in [facility], he went for dialysis session and was found to be hypotensive when a half hours into the session, he became lightheaded and was sent to the local ER where he was found to have a troponin of 0.2, he was then shipped to new [facility] where he underwent echocardiogram showing EF of 15% down from his previous known EF of close to 50%. He was also found to have critical AS, a Myoview showed reversible ischemia. During his hospitalization he had a very mild hematemesis for which she underwent EGD and was found to have a gastric polyp.  He was supposed to have a left heart cath but since his cardiologist was at [facility] he was transferred here under the care of cardiology group. I was consulted to manage his diabetes mellitus. He is currently is symptom-free.    Review Of Systems     In addition to the HPI above,    No Fever-chills, No Headache, No changes with Vision or hearing, No problems swallowing food or Liquids, No Chest pain, Cough or Shortness of Breath, No Abdominal pain, No Nausea or Vommitting, Bowel movements are regular, No Blood in stool or  Urine, No dysuria, No new skin rashes or bruises, No new joints pains-aches,  No new weakness, tingling, numbness in any extremity, No recent weight gain or loss, No polyuria, polydypsia or polyphagia, No significant Mental Stressors.  A full 10 point Review of Systems was done, except as stated above, all other Review of Systems were negative.  Social History   History  Substance Use Topics  . Smoking status: Former Smoker -- 0.50 packs/day for 44 years    Types: Cigarettes    Quit date: 12/09/2014  . Smokeless tobacco: Never Used  . Alcohol Use: No     Comment: 06/13/2013 "no alcohol in 40 yrs"      Family History   Family History  Problem Relation Age of Onset  . Diabetes Maternal Grandmother   . Diabetes Paternal Grandmother   . Cancer Neg Hx     no FH of Colon Cancer  . Diabetes Father       Medications   Prior to Admission medications   Medication Sig Start Date End Date Taking? Authorizing Provider  albuterol (PROVENTIL HFA;VENTOLIN HFA) 108 (90 BASE) MCG/ACT inhaler Inhale 2 puffs into the lungs every 4 (four) hours as needed for wheezing or shortness of breath. 06/17/13   Shanker Kristeen Mans, MD  calcium acetate (PHOSLO) 667 MG capsule Take 1,334 mg by mouth 3 (three) times daily with meals.     Historical Provider, MD  COUMADIN 5 MG tablet TAKE AS DIRECTED BY COUMADIN CLINIC 12/03/14   Thompson Grayer, MD  folic acid-vitamin b complex-vitamin c-selenium-zinc (DIALYVITE) 3 MG TABS tablet Take 1 tablet by mouth daily.    Historical Provider, MD  FOSRENOL 1000 MG chewable tablet Chew 2 tablets by mouth 3 (three) times daily with meals.  09/24/10   Historical Provider, MD  gabapentin (NEURONTIN) 300 MG capsule Take 300 mg by  mouth at bedtime.     Historical Provider, MD  HYDROcodone-acetaminophen (NORCO) 5-325 MG per tablet Take 1 tablet by mouth every 6 (six) hours as needed. 05/03/14   Alvia Grove, PA-C  loperamide (IMODIUM) 2 MG capsule Take 2 mg by mouth daily as needed for diarrhea or loose stools.     Historical Provider, MD  NOVOLOG FLEXPEN 100 UNIT/ML FlexPen INJECT 30 UNITS INTO THE SKIN 3 TIMES DAILY BEFORE MEALS. 12/03/14   Renato Shin, MD  omeprazole (PRILOSEC) 20 MG capsule Take 1 capsule (20 mg total) by mouth daily. 03/29/14   Renato Shin, MD  SENSIPAR 30 MG tablet Take 30 mg by mouth daily. 04/25/14   Historical Provider, MD  temazepam (RESTORIL) 30 MG capsule Take 30 mg by mouth at bedtime as needed for sleep.     Historical Provider, MD  tiotropium (SPIRIVA) 18 MCG inhalation capsule Place 1 capsule (18 mcg total) into inhaler and inhale daily. 06/17/13   Shanker Kristeen Mans, MD  UNIFINE PENTIPS 31G X 5 MM MISC USE AS DIRECTED 3 TIMES A DAY 07/09/14   Renato Shin, MD  varenicline (CHANTIX) 0.5 MG tablet Take 1 tablet (0.5 mg total) by mouth daily. 09/30/14   Imogene Burn, PA-C    Anti-infectives    None      Scheduled Meds: . antiseptic oral rinse  7 mL Mouth Rinse BID  . [START ON 12/30/2014] aspirin EC  81 mg Oral Daily  . [START ON 01/11/2015] calcium acetate  1,334 mg Oral TID WC  . cinacalcet  30 mg Oral Daily  . folic acid-vitamin b complex-vitamin c-selenium-zinc  1  tablet Oral Daily  . gabapentin  300 mg Oral QHS  . insulin aspart  0-5 Units Subcutaneous QHS  . [START ON 12/22/2014] insulin aspart  0-9 Units Subcutaneous TID WC  . [START ON 12/30/2014] insulin aspart  3 Units Subcutaneous TID WC  . insulin glargine  15 Units Subcutaneous BID  . metoprolol tartrate  12.5 mg Oral BID  . pantoprazole  40 mg Oral BID  . sodium chloride  3 mL Intravenous Q12H  . tiotropium  18 mcg Inhalation Daily   Continuous Infusions:  PRN Meds:.sodium chloride, acetaminophen, albuterol,  ALPRAZolam, HYDROcodone-acetaminophen, loperamide, nitroGLYCERIN, ondansetron (ZOFRAN) IV, ondansetron (ZOFRAN) IV, polyethylene glycol, sodium chloride, temazepam, zolpidem  No Known Allergies  Objective   Physical Exam  Vitals  Blood pressure 114/43, pulse 41, temperature 97.9 F (36.6 C), temperature source Oral, resp. rate 17, height 6' (1.829 m), weight 113.8 kg (250 lb 14.1 oz), SpO2 100 %.   1. General middle-aged obese white male lying in bed in NAD,     2. Normal affect and insight, Not Suicidal or Homicidal, Awake Alert, Oriented X 3.  3. No F.N deficits, ALL C.Nerves Intact, Strength 5/5 all 4 extremities, Sensation intact all 4 extremities, Plantars down going.  4. Ears and Eyes appear Normal, Conjunctivae clear, PERRLA. Moist Oral Mucosa.  5. Supple Neck, No JVD, No cervical lymphadenopathy appriciated, No Carotid Bruits.  6. Symmetrical Chest wall movement, Good air movement bilaterally, CTAB.  7. Irregular RRR, No Gallops, Rubs or Murmurs, No Parasternal Heave.  8. Positive Bowel Sounds, Abdomen Soft, No tenderness, No organomegaly appriciated,No rebound -guarding or rigidity.  9.  No Cyanosis, Normal Skin Turgor, No Skin Rash or Bruise.  10. Good muscle tone,  joints appear normal , no effusions, Normal ROM. Left BKA, right third toe looks to have a chronic dry ulcer  11. No Palpable Lymph Nodes in Neck or Axillae     Data   CBC No results for input(s): WBC, HGB, HCT, PLT, MCV, MCH, MCHC, RDW, LYMPHSABS, MONOABS, EOSABS, BASOSABS, BANDABS in the last 168 hours.  Invalid input(s): NEUTRABS, BANDSABD ------------------------------------------------------------------------------------------------------------------  Chemistries  No results for input(s): NA, K, CL, CO2, GLUCOSE, BUN, CREATININE, CALCIUM, MG, AST, ALT, ALKPHOS, BILITOT in the last 168 hours.  Invalid input(s):  GFRCGP ------------------------------------------------------------------------------------------------------------------ CrCl cannot be calculated (Patient has no serum creatinine result on file.). ------------------------------------------------------------------------------------------------------------------ No results for input(s): TSH, T4TOTAL, T3FREE, THYROIDAB in the last 72 hours.  Invalid input(s): FREET3   Coagulation profile No results for input(s): INR, PROTIME in the last 168 hours. ------------------------------------------------------------------------------------------------------------------- No results for input(s): DDIMER in the last 72 hours. -------------------------------------------------------------------------------------------------------------------  Cardiac Enzymes No results for input(s): CKMB, TROPONINI, MYOGLOBIN in the last 168 hours.  Invalid input(s): CK ------------------------------------------------------------------------------------------------------------------ Invalid input(s): POCBNP   ---------------------------------------------------------------------------------------------------------------  Urinalysis    Component Value Date/Time   COLORURINE LT. YELLOW 12/22/2011 1416   APPEARANCEUR CLEAR 12/22/2011 1416   LABSPEC 1.020 12/22/2011 1416   PHURINE 6.5 12/22/2011 1416   GLUCOSEU >=1000 12/22/2011 1416   GLUCOSEU 100* 01/06/2011 1727   HGBUR SMALL 12/22/2011 1416   BILIRUBINUR NEGATIVE 12/22/2011 1416   KETONESUR NEGATIVE 12/22/2011 1416   PROTEINUR 100* 01/06/2011 1727   UROBILINOGEN 0.2 12/22/2011 1416   NITRITE NEGATIVE 12/22/2011 1416   LEUKOCYTESUR TRACE 12/22/2011 1416     Imaging    No results found.  My personal review of EKG: Rhythm Afib, Rate  80s /min,  lateral ST changes  Assessment & Paln   1. Reversible ischemia on Myoview,  mild non-ST elevation MI, critical aortic stenosis. Defer management to cardiology  primary team   2. Chronic atrial fibrillation. [place] vascular 2 score of greater than 4. Currently on heparin drip, Coumadin on hold, rate control per cardiology. Being monitored on telemetry.   3. DM type II. Check A1c, placed on Lantus along with sliding scale. Monitor CBGs.   4. PAD. Status post left BKA, right third toe has a chronic dry ulcer, will request primary team to consider involving Dr.Duda patient's orthopedic surgeon.   5. Smoking. Counseled to quit.   6. COPD. No acute issues placed on Spiriva scheduled along with as needed albuterol, oxygen as needed. No wheezing on exam.   7. ESRD. Renal involved for dialysis.   Have ordered basic labs for tonight.   DVT Prophylaxis Heparin per Primary team    AM Labs Ordered, also please review Full Orders  Family Communication: Plan discussed with patient and wife   Thank you for the consult, we will follow the patient with you in the Hospital.   Lala Lund K M.D on 12/15/2014 at 5:33 PM  Between 7am to 7pm - Pager - [phone]. After 7pm go to [email or web address] - password [email or web address]  [facility]  - Office  [phone]   Thank you for the consult, we will follow the patient with you in the Hospital.

## 2014-12-31 ENCOUNTER — Encounter (HOSPITAL_COMMUNITY): Payer: Self-pay | Admitting: *Deleted

## 2014-12-31 ENCOUNTER — Inpatient Hospital Stay (HOSPITAL_COMMUNITY): Payer: Medicare Other

## 2014-12-31 ENCOUNTER — Encounter (HOSPITAL_COMMUNITY): Admission: EM | Disposition: E | Payer: 59 | Source: Other Acute Inpatient Hospital | Attending: Internal Medicine

## 2014-12-31 DIAGNOSIS — I2510 Atherosclerotic heart disease of native coronary artery without angina pectoris: Secondary | ICD-10-CM

## 2014-12-31 DIAGNOSIS — I70229 Atherosclerosis of native arteries of extremities with rest pain, unspecified extremity: Secondary | ICD-10-CM | POA: Diagnosis present

## 2014-12-31 DIAGNOSIS — E785 Hyperlipidemia, unspecified: Secondary | ICD-10-CM

## 2014-12-31 DIAGNOSIS — I998 Other disorder of circulatory system: Secondary | ICD-10-CM

## 2014-12-31 DIAGNOSIS — R778 Other specified abnormalities of plasma proteins: Secondary | ICD-10-CM

## 2014-12-31 DIAGNOSIS — R7989 Other specified abnormal findings of blood chemistry: Secondary | ICD-10-CM

## 2014-12-31 HISTORY — PX: CARDIAC CATHETERIZATION: SHX172

## 2014-12-31 LAB — CBC
HCT: 39.7 % (ref 39.0–52.0)
Hemoglobin: 12.5 g/dL — ABNORMAL LOW (ref 13.0–17.0)
MCH: 30.5 pg (ref 26.0–34.0)
MCHC: 31.5 g/dL (ref 30.0–36.0)
MCV: 96.8 fL (ref 78.0–100.0)
PLATELETS: 318 10*3/uL (ref 150–400)
RBC: 4.1 MIL/uL — ABNORMAL LOW (ref 4.22–5.81)
RDW: 16.8 % — ABNORMAL HIGH (ref 11.5–15.5)
WBC: 11 10*3/uL — ABNORMAL HIGH (ref 4.0–10.5)

## 2014-12-31 LAB — BASIC METABOLIC PANEL
Anion gap: 19 — ABNORMAL HIGH (ref 5–15)
BUN: 40 mg/dL — ABNORMAL HIGH (ref 6–20)
CO2: 18 mmol/L — AB (ref 22–32)
CREATININE: 7.2 mg/dL — AB (ref 0.61–1.24)
Calcium: 9.3 mg/dL (ref 8.9–10.3)
Chloride: 96 mmol/L — ABNORMAL LOW (ref 101–111)
GFR calc Af Amer: 8 mL/min — ABNORMAL LOW (ref >60)
GFR calc non Af Amer: 7 mL/min — ABNORMAL LOW (ref >60)
GLUCOSE: 207 mg/dL — AB (ref 65–99)
POTASSIUM: 5.9 mmol/L — AB (ref 3.5–5.1)
Sodium: 133 mmol/L — ABNORMAL LOW (ref 135–145)

## 2014-12-31 LAB — HEMOGLOBIN A1C
Hgb A1c MFr Bld: 9.1 % — ABNORMAL HIGH (ref 4.8–5.6)
MEAN PLASMA GLUCOSE: 214 mg/dL

## 2014-12-31 LAB — POCT I-STAT 3, VENOUS BLOOD GAS (G3P V)
ACID-BASE DEFICIT: 3 mmol/L — AB (ref 0.0–2.0)
Bicarbonate: 23.8 mEq/L (ref 20.0–24.0)
O2 SAT: 48 %
TCO2: 25 mmol/L (ref 0–100)
pCO2, Ven: 49.7 mmHg (ref 45.0–50.0)
pH, Ven: 7.288 (ref 7.250–7.300)
pO2, Ven: 29 mmHg — CL (ref 30.0–45.0)

## 2014-12-31 LAB — POCT I-STAT 3, ART BLOOD GAS (G3+)
Acid-base deficit: 5 mmol/L — ABNORMAL HIGH (ref 0.0–2.0)
Bicarbonate: 21.4 mEq/L (ref 20.0–24.0)
O2 Saturation: 96 %
TCO2: 23 mmol/L (ref 0–100)
pCO2 arterial: 41.4 mmHg (ref 35.0–45.0)
pH, Arterial: 7.321 — ABNORMAL LOW (ref 7.350–7.450)
pO2, Arterial: 86 mmHg (ref 80.0–100.0)

## 2014-12-31 LAB — GLUCOSE, CAPILLARY: GLUCOSE-CAPILLARY: 171 mg/dL — AB (ref 65–99)

## 2014-12-31 LAB — HEPARIN LEVEL (UNFRACTIONATED): Heparin Unfractionated: 0.31 IU/mL (ref 0.30–0.70)

## 2014-12-31 LAB — POCT ACTIVATED CLOTTING TIME: Activated Clotting Time: 98 seconds

## 2014-12-31 SURGERY — RIGHT/LEFT HEART CATH AND CORONARY ANGIOGRAPHY

## 2014-12-31 MED ORDER — ATORVASTATIN CALCIUM 80 MG PO TABS
80.0000 mg | ORAL_TABLET | Freq: Every day | ORAL | Status: DC
  Administered 2014-12-31 – 2015-01-02 (×3): 80 mg via ORAL
  Filled 2014-12-31 (×4): qty 1

## 2014-12-31 MED ORDER — NEPRO/CARBSTEADY PO LIQD
237.0000 mL | ORAL | Status: DC | PRN
  Filled 2014-12-31: qty 237

## 2014-12-31 MED ORDER — LIDOCAINE HCL (PF) 1 % IJ SOLN
INTRAMUSCULAR | Status: AC
  Filled 2014-12-31: qty 30

## 2014-12-31 MED ORDER — HEPARIN (PORCINE) IN NACL 100-0.45 UNIT/ML-% IJ SOLN
2600.0000 [IU]/h | INTRAMUSCULAR | Status: DC
  Administered 2014-12-31 – 2015-01-01 (×2): 2400 [IU]/h via INTRAVENOUS
  Administered 2015-01-02 – 2015-01-03 (×2): 2600 [IU]/h via INTRAVENOUS
  Filled 2014-12-31 (×13): qty 250

## 2014-12-31 MED ORDER — FENTANYL CITRATE (PF) 100 MCG/2ML IJ SOLN
INTRAMUSCULAR | Status: AC
  Filled 2014-12-31: qty 2

## 2014-12-31 MED ORDER — SODIUM CHLORIDE 0.9 % IJ SOLN
3.0000 mL | INTRAMUSCULAR | Status: DC | PRN
Start: 2014-12-31 — End: 2015-01-05

## 2014-12-31 MED ORDER — SODIUM CHLORIDE 0.9 % IJ SOLN: 3.0000 mL | INTRAMUSCULAR | Status: DC | PRN

## 2014-12-31 MED ORDER — NITROGLYCERIN 1 MG/10 ML FOR IR/CATH LAB
INTRA_ARTERIAL | Status: DC | PRN
  Administered 2014-12-31: 12:00:00

## 2014-12-31 MED ORDER — ACETAMINOPHEN 325 MG PO TABS: 650.0000 mg | ORAL_TABLET | ORAL | Status: DC | PRN

## 2014-12-31 MED ORDER — HEPARIN SODIUM (PORCINE) 1000 UNIT/ML DIALYSIS: 1000.0000 [IU] | INTRAMUSCULAR | Status: DC | PRN

## 2014-12-31 MED ORDER — FENTANYL CITRATE (PF) 100 MCG/2ML IJ SOLN
INTRAMUSCULAR | Status: DC | PRN
  Administered 2014-12-31 (×2): 25 ug via INTRAVENOUS

## 2014-12-31 MED ORDER — SODIUM CHLORIDE 0.9 % IV SOLN: 100.0000 mL | INTRAVENOUS | Status: DC | PRN

## 2014-12-31 MED ORDER — SODIUM CHLORIDE 0.9 % IV SOLN: 250.0000 mL | INTRAVENOUS | Status: DC | PRN

## 2014-12-31 MED ORDER — ONDANSETRON HCL 4 MG/2ML IJ SOLN
4.0000 mg | Freq: Four times a day (QID) | INTRAMUSCULAR | Status: DC | PRN
  Filled 2014-12-31: qty 2

## 2014-12-31 MED ORDER — INSULIN GLARGINE 100 UNIT/ML ~~LOC~~ SOLN
20.0000 [IU] | Freq: Two times a day (BID) | SUBCUTANEOUS | Status: DC
  Filled 2014-12-31: qty 0.2

## 2014-12-31 MED ORDER — SODIUM CHLORIDE 0.9 % IJ SOLN
3.0000 mL | Freq: Two times a day (BID) | INTRAMUSCULAR | Status: DC
  Administered 2015-01-01 – 2015-01-04 (×5): 3 mL via INTRAVENOUS

## 2014-12-31 MED ORDER — SODIUM CHLORIDE 0.9 % IJ SOLN
3.0000 mL | Freq: Two times a day (BID) | INTRAMUSCULAR | Status: DC
Start: 2014-12-31 — End: 2015-01-05
  Administered 2014-12-31 – 2015-01-05 (×9): 3 mL via INTRAVENOUS

## 2014-12-31 MED ORDER — LIDOCAINE HCL (PF) 1 % IJ SOLN: 5.0000 mL | INTRAMUSCULAR | Status: DC | PRN

## 2014-12-31 MED ORDER — MIDAZOLAM HCL 2 MG/2ML IJ SOLN
INTRAMUSCULAR | Status: DC | PRN
  Administered 2014-12-31 (×3): 1 mg via INTRAVENOUS

## 2014-12-31 MED ORDER — MIDAZOLAM HCL 2 MG/2ML IJ SOLN
INTRAMUSCULAR | Status: AC
  Filled 2014-12-31: qty 2

## 2014-12-31 MED ORDER — IOHEXOL 350 MG/ML SOLN
INTRAVENOUS | Status: DC | PRN
  Administered 2014-12-31: 70 mL via INTRACARDIAC

## 2014-12-31 MED ORDER — INSULIN ASPART 100 UNIT/ML ~~LOC~~ SOLN
0.0000 [IU] | Freq: Three times a day (TID) | SUBCUTANEOUS | Status: DC
  Administered 2015-01-01: 7 [IU] via SUBCUTANEOUS
  Administered 2015-01-01: 11 [IU] via SUBCUTANEOUS
  Administered 2015-01-02 (×2): 3 [IU] via SUBCUTANEOUS

## 2014-12-31 MED ORDER — HEPARIN (PORCINE) IN NACL 2-0.9 UNIT/ML-% IJ SOLN
INTRAMUSCULAR | Status: AC
  Filled 2014-12-31: qty 1500

## 2014-12-31 MED ORDER — INSULIN ASPART 100 UNIT/ML ~~LOC~~ SOLN: 5.0000 [IU] | Freq: Three times a day (TID) | SUBCUTANEOUS | Status: DC

## 2014-12-31 MED ORDER — INSULIN ASPART 100 UNIT/ML ~~LOC~~ SOLN
2.0000 [IU] | Freq: Three times a day (TID) | SUBCUTANEOUS | Status: DC
  Administered 2015-01-01 – 2015-01-02 (×5): 2 [IU] via SUBCUTANEOUS

## 2014-12-31 MED ORDER — ASPIRIN 81 MG PO CHEW
81.0000 mg | CHEWABLE_TABLET | Freq: Every day | ORAL | Status: DC
  Administered 2015-01-01 – 2015-01-02 (×2): 81 mg via ORAL
  Filled 2014-12-31 (×3): qty 1

## 2014-12-31 MED ORDER — INSULIN ASPART 100 UNIT/ML ~~LOC~~ SOLN: 0.0000 [IU] | Freq: Every day | SUBCUTANEOUS | Status: DC

## 2014-12-31 MED ORDER — PENTAFLUOROPROP-TETRAFLUOROETH EX AERO: 1.0000 "application " | INHALATION_SPRAY | CUTANEOUS | Status: DC | PRN

## 2014-12-31 MED ORDER — LIDOCAINE-PRILOCAINE 2.5-2.5 % EX CREA
1.0000 "application " | TOPICAL_CREAM | CUTANEOUS | Status: DC | PRN
  Filled 2014-12-31: qty 5

## 2014-12-31 MED ORDER — LIDOCAINE HCL (PF) 1 % IJ SOLN
INTRAMUSCULAR | Status: DC | PRN
  Administered 2014-12-31: 15 mL via INTRADERMAL

## 2014-12-31 MED FILL — Heparin Sodium (Porcine) 100 Unt/ML in Sodium Chloride 0.45%: INTRAMUSCULAR | Qty: 250 | Status: AC

## 2014-12-31 SURGICAL SUPPLY — 16 items
CATH INFINITI 5FR MULTPACK ANG (CATHETERS) ×2 IMPLANT
CATH SWAN GANZ 7F STRAIGHT (CATHETERS) ×2 IMPLANT
HOVERMATT SINGLE USE (MISCELLANEOUS) ×2 IMPLANT
KIT ENCORE 26 ADVANTAGE (KITS) ×2 IMPLANT
KIT HEART LEFT (KITS) ×3 IMPLANT
KIT HEART RIGHT NAMIC (KITS) ×3 IMPLANT
PACK CARDIAC CATHETERIZATION (CUSTOM PROCEDURE TRAY) ×3 IMPLANT
SHEATH PINNACLE 5F 10CM (SHEATH) ×2 IMPLANT
SHEATH PINNACLE 6F 10CM (SHEATH) ×2 IMPLANT
SHEATH PINNACLE 7F 10CM (SHEATH) ×2 IMPLANT
SYR MEDRAD MARK V 150ML (SYRINGE) ×3 IMPLANT
TRANSDUCER W/STOPCOCK (MISCELLANEOUS) ×6 IMPLANT
TUBING CIL FLEX 10 FLL-RA (TUBING) ×3 IMPLANT
WIRE EMERALD 3MM-J .025X260CM (WIRE) ×2 IMPLANT
WIRE EMERALD 3MM-J .035X150CM (WIRE) ×2 IMPLANT
WIRE EMERALD ST .035X260CM (WIRE) ×2 IMPLANT

## 2014-12-31 NOTE — Consult Note (Signed)
[facility] [place] Consultation  Requesting: Dr Ellyn Hack History of Present Illness:  Patient is a 63 y.o. year old male who presents for evaluation of ischemic right foot.  Pt admitted for coronary disease eval after dyspnea.  He was noted to have ischemia right foot.  He has no pain in the foot.  Prior to this had a blister on right foot that was being managed by Dr Sharol Given.  Pt is ambulatory to a certain degree.  He has had a prior left BKA.  Cath today showed EF 15% and multi vessel CAD.  Right femoral approach.  Other medical problems include hypertension, afib, CAD, CHF, diabetes, + tobacco quit 1 month ago, ESRD T Th Sat dialysis.  Past Medical History  Diagnosis Date  . GERD (gastroesophageal reflux disease)   . Hypertension   . Neuromuscular disorder     neurophathy  . Atrial fibrillation     chronically anticoagulated with coumadin  . Hyperlipidemia   . Atrial flutter 07/07/2009    s/p ablation  . GOUT 08/01/2008  . ERECTILE DYSFUNCTION 08/01/2008  . PERIPHERAL NEUROPATHY 08/01/2008  . CORONARY ARTERY DISEASE 08/01/2008    non obstructive per cath 1/10; Dr. Olevia Perches  . 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 Gastroparesis 08/01/2008  . NEPHROLITHIASIS, HX OF 08/01/2008  . Chronic systolic heart failure 01/20/3809  . COPD 07/21/2010  . Diverticulosis of colon (without mention of hemorrhage) 10/28/2010  . Benign neoplasm of colon 10/28/2010  . Hemorrhoids, internal 10/28/2010  . Second degree Mobitz I AV block     asymptomatic  . Cyst     "I've had 4-5; pretty much all of them have got infected" (06/13/2013)  . Muscle spasm of back     back muscle spasms and pt is unable to lay flat   . Abscess of back     lower middle portion of back  . Peripheral vascular disease     poor circulation  . Pneumonia     "first time I've been dx'd was today" (06/13/2013)  . Shortness of breath     "just related to this pneumonia" (06/13/2013)  . Type II  diabetes mellitus dx'd 1980's  . Migraines     "none since the 1980's" (06/13/2013)  . End stage renal failure on dialysis 08/01/2008    "[place]; TTS" (06/13/2013)  . Renal cyst     bilateral  . Basal cell carcinoma     nose  . Depression     situaltional     Past Surgical History  Procedure Laterality Date  . Atrial ablation surgery  07-2008, 07/08/09    Dr Rayann Heman  . Foot surgery Right 09-2009    infection got into the bone; had to put rod in to hold foot together" (06/13/2013)  . Colonoscopy  2011?    Dr.Orr  . Av fistula placement Left 2010    upper arm  . Elbow fracture surgery Right 1974  . Pilonidal cyst excision  1971  . Cataract extraction Right ~ 2012  . I&d extremity Left 12/29/2012    Procedure: IRRIGATION AND DEBRIDEMENT EXTREMITY;  Surgeon: Newt Minion, MD;  Location: [place];  Service: Orthopedics;  Laterality: Left;  Irrigation and Debridement Left Leg, VAC, [place]  . Toe amputation Right 01/18/2013    "little toe" Dr Sharol Given  . I&d extremity Bilateral 01/18/2013    Procedure: IRRIGATION AND DEBRIDEMENT EXTREMITYLEFT LEG AND RIGHT FOOT;  Surgeon: Illene Regulus  Sharol Given, MD;  Location: [place];  Service: Orthopedics;  Laterality: Bilateral;  Left Leg Excisional Debridement  . Skin graft Left     left leg within the past month  . Amputation Left 02/02/2013    Procedure: AMPUTATION BELOW KNEE;  Surgeon: Newt Minion, MD;  Location: [place];  Service: Orthopedics;  Laterality: Left;  Left Below Knee Amputation  . Fracture surgery    . Vasectomy    . Amputation Left 03/23/2013    Procedure: REVISION OF BELOW THE KNEE  AMPUTATION ;  Surgeon: Newt Minion, MD;  Location: [place];  Service: Orthopedics;  Laterality: Left;  Left Below Knee Amputation Revision  . I&d extremity Left 05/04/2013    Procedure: Irrigation and Debridement Left Below Knee Ampuation;  Surgeon: Newt Minion, MD;  Location: [place];  Service: Orthopedics;  Laterality: Left;  Irrigation and Debridement Left Below  Knee Ampuation  . Amputation Left 05/30/2013    Procedure: REVISION AMPUTATION BELOW KNEE;  Surgeon: Newt Minion, MD;  Location: [place];  Service: Orthopedics;  Laterality: Left;  Revision Left Below Knee Amputation, Possible Above Knee Amputation  . Tonsillectomy    . Amputation Left 02/13/2014    Procedure: Left Long Finger Amputation PIP Joint;  Surgeon: Newt Minion, MD;  Location: [place];  Service: Orthopedics;  Laterality: Left;  . Revison of arteriovenous fistula Left 00/37/0488    Procedure: PLICATION, RESECTION & BANDING ,OF LEFT UPPER ARM BASILIC VEIN TRANSPOSITION;  Surgeon: Serafina Mitchell, MD;  [facility];  Service: Vascular;  Laterality: Left;    Social History History  Substance Use Topics  . Smoking status: Former Smoker -- 0.50 packs/day for 44 years    Types: Cigarettes    Quit date: 12/09/2014  . Smokeless tobacco: Never Used  . Alcohol Use: No     Comment: 06/13/2013 "no alcohol in 40 yrs"    Family History Family History  Problem Relation Age of Onset  . Diabetes Maternal Grandmother   . Diabetes Paternal Grandmother   . Cancer Neg Hx     no FH of Colon Cancer  . Diabetes Father     Allergies  No Known Allergies   Current Facility-Administered Medications  Medication Dose Route Frequency Provider Last Rate Last Dose  . 0.9 %  sodium chloride infusion  250 mL Intravenous PRN Doreene Burke Kilroy, PA-C      . 0.9 %  sodium chloride infusion  250 mL Intravenous PRN Troy Sine, MD      . 0.9 %  sodium chloride infusion  250 mL Intravenous PRN Troy Sine, MD      . acetaminophen (TYLENOL) tablet 650 mg  650 mg Oral Q4H PRN Troy Sine, MD      . albuterol (PROVENTIL) (2.5 MG/3ML) 0.083% nebulizer solution 2.5 mg  2.5 mg Nebulization Q4H PRN Thurnell Lose, MD      . ALPRAZolam Duanne Moron) tablet 0.25 mg  0.25 mg Oral BID PRN Erlene Quan, PA-C   0.25 mg at 12/15/2014 2154  . antiseptic oral rinse (CPC / CETYLPYRIDINIUM CHLORIDE 0.05%) solution 7 mL  7  mL Mouth Rinse BID Thompson Grayer, MD   7 mL at 12/25/2014 1000  . [START ON 01/01/2015] aspirin chewable tablet 81 mg  81 mg Oral Daily Troy Sine, MD      . atorvastatin (LIPITOR) tablet 80 mg  80 mg Oral q1800 Thompson Grayer, MD      .  calcium acetate (PHOSLO) capsule 1,334 mg  1,334 mg Oral TID WC Thompson Grayer, MD   1,334 mg at 12/21/2014 0929  . cinacalcet (SENSIPAR) tablet 30 mg  30 mg Oral Q breakfast Thurnell Lose, MD   30 mg at 01/03/2015 0928  . gabapentin (NEURONTIN) capsule 300 mg  300 mg Oral QHS Thurnell Lose, MD   300 mg at 12/15/2014 2152  . heparin ADULT infusion 100 units/mL (25000 units/250 mL)  2,400 Units/hr Intravenous Continuous Lauren D Bajbus, RPH      . HYDROcodone-acetaminophen (NORCO/VICODIN) 5-325 MG per tablet 1 tablet  1 tablet Oral Q6H PRN Thurnell Lose, MD   1 tablet at 12/21/2014 2151  . insulin aspart (novoLOG) injection 0-20 Units  0-20 Units Subcutaneous TID WC Thurnell Lose, MD   0 Units at 01/08/2015 1200  . insulin aspart (novoLOG) injection 0-5 Units  0-5 Units Subcutaneous QHS Thurnell Lose, MD      . insulin aspart (novoLOG) injection 2 Units  2 Units Subcutaneous TID WC Thurnell Lose, MD   2 Units at 12/26/2014 1200  . loperamide (IMODIUM) capsule 2 mg  2 mg Oral Daily PRN Thurnell Lose, MD      . metoprolol tartrate (LOPRESSOR) tablet 12.5 mg  12.5 mg Oral BID Doreene Burke Kilroy, PA-C   12.5 mg at 01/04/2015 0930  . multivitamin (RENA-VIT) tablet 1 tablet  1 tablet Oral QHS Thompson Grayer, MD   1 tablet at 12/25/2014 2152  . nitroGLYCERIN (NITROSTAT) SL tablet 0.4 mg  0.4 mg Sublingual Q5 Min x 3 PRN Doreene Burke Kilroy, PA-C      . ondansetron [facility]) injection 4 mg  4 mg Intravenous Q6H PRN Troy Sine, MD      . pantoprazole (PROTONIX) EC tablet 40 mg  40 mg Oral BID Thurnell Lose, MD   40 mg at 12/28/2014 0929  . polyethylene glycol (MIRALAX / GLYCOLAX) packet 17 g  17 g Oral Daily PRN Thurnell Lose, MD      . sodium chloride 0.9 % injection 3 mL  3 mL  Intravenous Q12H Luke K Kilroy, PA-C   3 mL at 12/23/2014 0930  . sodium chloride 0.9 % injection 3 mL  3 mL Intravenous PRN Doreene Burke Kilroy, PA-C      . sodium chloride 0.9 % injection 3 mL  3 mL Intravenous Q12H Troy Sine, MD      . sodium chloride 0.9 % injection 3 mL  3 mL Intravenous PRN Troy Sine, MD      . sodium chloride 0.9 % injection 3 mL  3 mL Intravenous Q12H Troy Sine, MD   3 mL at 01/10/2015 1300  . sodium chloride 0.9 % injection 3 mL  3 mL Intravenous PRN Troy Sine, MD      . temazepam (RESTORIL) capsule 30 mg  30 mg Oral QHS PRN Thurnell Lose, MD      . tiotropium [facility]) inhalation capsule 18 mcg  18 mcg Inhalation Daily Thurnell Lose, MD   18 mcg at 12/30/2014 0824  . zolpidem (AMBIEN) tablet 5 mg  5 mg Oral QHS PRN,MR X 1 Luke K Kilroy, PA-C        ROS:   General:  No weight loss, Fever, chills  HEENT: No recent headaches, no nasal bleeding, no visual changes, no sore throat  Neurologic: No dizziness, blackouts, seizures. No recent symptoms of stroke or mini- stroke. No  recent episodes of slurred speech, or temporary blindness.  Cardiac: No recent episodes of chest pain/pressure, no shortness of breath at rest.  + shortness of breath with exertion.  Denies history of atrial fibrillation or irregular heartbeat  Vascular: No history of rest pain in feet.  No history of claudication.  + history of non-healing ulcer, No history of DVT   Pulmonary: No home oxygen, no productive cough, no hemoptysis,  No asthma or wheezing  Musculoskeletal:  [ ]  Arthritis, [ ]  Low back pain,  [ ]  Joint pain  Hematologic:No history of hypercoagulable state.  No history of easy bleeding.  No history of anemia  Gastrointestinal: No hematochezia or melena,  + gastroesophageal reflux, no trouble swallowing  Urinary: [ ]  chronic Kidney disease, [x ] on HD - [ ]  MWF or [ x] TTHS, [ ]  Burning with urination, [ ]  Frequent urination, [ ]  Difficulty urinating;   Skin: No  rashes  Psychological: No history of anxiety,  No history of depression   Physical Examination  Filed Vitals:   01/10/2015 1400 01/04/2015 1415 01/09/2015 1430 01/03/2015 1445  BP: 94/42 86/52 89/17  108/23  Pulse: 73 79 75 72  Temp:      TempSrc:      Resp: 14 16 16 13   Height:      Weight:      SpO2: 99% 97% 98% 97%    Body mass index is 34.59 kg/(m^2).  General:  Alert and oriented, no acute distress HEENT: Normal Neck: No JVD Pulmonary: Clear to auscultation bilaterally Cardiac: Regular Rate and Rhythm  Abdomen: Soft, non-tender, non-distended, no mass, obese Skin: No rash Extremity Pulses:  2+ radial, brachial,absent left femoral,absent right popliteal dorsalis pedis, posterior tibial pulses bilaterally, dressing right groin did not palpate pulse secondary to pain Musculoskeletal: No deformity or edema, right foot cool, entire forefoot is dusky, moves extremity  Neurologic: Upper and lower extremity motor 5/5 and symmetric  DATA:    CBC    Component Value Date/Time   WBC 11.0* 12/30/2014 0231   RBC 4.10* 12/28/2014 0231   HGB 12.5* 12/14/2014 0231   HCT 39.7 12/29/2014 0231   PLT 318 12/18/2014 0231   MCV 96.8 12/25/2014 0231   MCH 30.5 12/16/2014 0231   MCHC 31.5 12/28/2014 0231   RDW 16.8* 01/08/2015 0231   LYMPHSABS 1.5 12/14/2014 0555   MONOABS 0.8 01/09/2015 0555   EOSABS 0.2 12/21/2014 0555   BASOSABS 0.1 01/04/2015 0555     BMET    Component Value Date/Time   NA 133* 01/04/2015 0231   K 5.9* 12/30/2014 0231   CL 96* 01/10/2015 0231   CO2 18* 12/28/2014 0231   GLUCOSE 207* 01/04/2015 0231   BUN 40* 12/29/2014 0231   CREATININE 7.20* 01/07/2015 0231   CALCIUM 9.3 12/21/2014 0231   CALCIUM 9.4 01/05/2010 1310   GFRNONAA 7* 01/10/2015 0231   GFRAA 8* 01/04/2015 0231    Cath reviewed multivessel disease no plans per documented per cardiology currently   ASSESSMENT:  Severe ischemia right forefoot.  Most likely foot is not salvageable.  Depending  on cardiology plans options include agram to consider percutaneous revascularization although most likely unreconstructable tibial disease.  Other option would be primary amputation most likely AKA.  He is not a candidate for open repair due to his severe coronary disease.   PLAN:  Will follow up with pt tomorrow for further discussions after plans with cardiology defined.  Ruta Hinds, MD [facility]  Office: [phone] Pager: [phone]

## 2014-12-31 NOTE — Care Management Note (Signed)
Case Management Note  Patient Details  Name: [name] MRN: [id number] Date of Birth: 03/09/1951  Subjective/Objective:     Adm w nstemi               Action/Plan: lives w wife, pcp dr Renato Shin   Expected Discharge Date:                  Expected Discharge Plan:     In-House Referral:     Discharge planning Services     Post Acute Care Choice:    Choice offered to:     DME Arranged:    DME Agency:     HH Arranged:    [place] Agency:     Status of Service:     Medicare Important Message Given:    Date Medicare IM Given:    Medicare IM give by:    Date Additional Medicare IM Given:    Additional Medicare Important Message give by:     If discussed at [place] of Stay Meetings, dates discussed:    Additional Comments: ur review done  Lacretia Leigh, RN 12/21/2014, 9:28 AM

## 2014-12-31 NOTE — Progress Notes (Signed)
Back from the cath. lab by bed awake and alert. Right groin dressing intact., instructed  to avoid bending right knee, when sneezing or coughing to press on right groin to prevent bleeding. Bedrest emphasized.

## 2014-12-31 NOTE — Progress Notes (Signed)
Consult Note                               Patient Demographics:    [name], is a 64 y.o. male, DOB - 1950/06/20, FYB:[id number]  Admit date - 12/16/2014   Admitting Physician Thompson Grayer, MD  Outpatient Primary MD for the patient is Renato Shin, MD  LOS - 1   No chief complaint on file.     Summary  [name] is a 64 y.o. male, chronic atrial fibrillation on Coumadin, ESRD on dialysis, dyslipidemia, PAD status post left BKA by Dr. Sharol Given in the past, now being followed for right third toe ulcer, COPD, ongoing smoking counseled to quit, insulin-dependent type 2 diabetes mellitus, diabetic neuropathy, COPD, essential hypertension, gout, GERD.  Patient with above history who is from [facility] and follows with Dr. Rayann Heman cardiologist and Dr. Mercy Moore nephrologist was [place] in [facility], he went for dialysis session and was found to be hypotensive when a half hours into the session, he became lightheaded and was sent to the local ER where he was found to have a troponin of 0.2, he was then shipped to new [facility] where he underwent echocardiogram showing EF of 15% down from his previous known EF of close to 50%. He was also found to have critical AS, a Myoview showed reversible ischemia. During his hospitalization he had a very mild hematemesis for which she underwent EGD and was found to have a gastric polyp.  He was supposed to have a left heart cath but since his cardiologist was at [facility] he was transferred here under the care of cardiology group. Hospitalist team was consulted to manage his diabetes mellitus.      Subjective:    [name] today has, No headache, No chest pain, No abdominal pain - No Nausea, No new weakness tingling or numbness, No Cough - mild SOB.     Assessment  & Plan :     1. Reversible ischemia on Myoview, mild non-ST elevation MI, critical aortic stenosis. Defer management to cardiology primary team   2. Chronic atrial fibrillation. [place] vascular 2 score of greater than 4. Currently on heparin drip, Coumadin on hold, rate control per cardiology. Being monitored on telemetry.   3. DM type II. Poor outpatient control A1c 9.1, placed on Lantus which patient refused to take, now on sliding scale monitor CBGs closely.  Lab Results  Component Value Date   HGBA1C 9.1* 01/10/2015   CBG (last 3)   Recent Labs  12/29/2014 1630 12/23/2014 2120 12/26/2014 0922  GLUCAP 194* 278* 171*     4. PAD. Status post left BKA, right 2nd toe has a chronic dry ulcer, right foot has some cyanotic changes however patient says that he does not have any pain and the discoloration has been present for the last few months, his orthopedic surgeon Dr. Sharol Given is already on board, have requested cardiology to involve vascular surgery as well.   5. Smoking. Counseled to quit.   6. COPD. No acute issues, placed on Spiriva scheduled along with as needed albuterol, oxygen as needed. No wheezing on exam.   7. ESRD. Renal involved for dialysis.  DVT Prophylaxis  :  Heparin    Lab Results  Component Value Date   PLT 318 01/09/2015    Inpatient Medications  Scheduled Meds: . antiseptic oral rinse  7 mL Mouth Rinse BID  . aspirin EC  81 mg Oral Daily  . calcium acetate  1,334 mg Oral TID WC  . cinacalcet  30 mg Oral Q breakfast  . gabapentin  300 mg Oral QHS  . insulin aspart  0-20 Units Subcutaneous TID WC  . insulin aspart  0-5 Units Subcutaneous QHS  . insulin aspart  2 Units Subcutaneous TID WC  . metoprolol tartrate  12.5 mg Oral BID  . multivitamin  1 tablet Oral QHS  . pantoprazole  40 mg Oral BID  . sodium chloride  3 mL Intravenous Q12H  . tiotropium  18 mcg Inhalation Daily   Continuous Infusions: . heparin 2,400 Units/hr  (01/04/2015 0600)   PRN Meds:.sodium chloride, acetaminophen, albuterol, ALPRAZolam, HYDROcodone-acetaminophen, loperamide, nitroGLYCERIN, ondansetron (ZOFRAN) IV, polyethylene glycol, sodium chloride, temazepam, zolpidem  Antibiotics  :    Anti-infectives    None        Objective:   Filed Vitals:   01/11/2015 2300 12/23/2014 0300 12/25/2014 0430 12/19/2014 0818  BP: 115/74 118/98  112/63  Pulse: 68 75  78  Temp: 97.5 F (36.4 C) 97.5 F (36.4 C) 98 F (36.7 C) 97.4 F (36.3 C)  TempSrc: Oral Oral Oral Oral  Resp: 19 15  12   Height:      Weight:      SpO2: 99%  92% 98%    Wt Readings from Last 3 Encounters:  01/07/2015 115.7 kg (255 lb 1.2 oz)  10/04/14 103.42 kg (228 lb)  09/30/14 107.956 kg (238 lb)     Intake/Output Summary (Last 24 hours) at 12/30/2014 0926 Last data filed at 12/18/2014 0800  Gross per 24 hour  Intake    504 ml  Output      0 ml  Net    504 ml     Physical Exam  Awake Alert, Oriented X 3, No new F.N deficits, Normal affect [place].AT,PERRAL Supple Neck,No JVD, No cervical lymphadenopathy appriciated.  Symmetrical Chest wall movement, Good air movement bilaterally, CTAB RRR,No Gallops,Rubs or new Murmurs, No Parasternal Heave +ve B.Sounds, Abd Soft, No tenderness, No organomegaly appriciated, No rebound - guarding or rigidity. No Cyanosis, Clubbing or edema, No new Rash or bruise  L BKA, R foot 2 nd toe small ulcer, foot is cyanotic and cold    Data Review:   Micro Results Recent Results (from the past 240 hour(s))  MRSA PCR Screening     Status: None   Collection Time: 12/29/2014  3:49 PM  Result Value Ref Range Status   MRSA by PCR NEGATIVE NEGATIVE Final    Comment:        The GeneXpert MRSA Assay (FDA approved for NASAL specimens only), is one component of a comprehensive MRSA colonization surveillance program. It is not intended to diagnose MRSA infection nor to guide or monitor treatment for MRSA infections.     Radiology Reports No  results found.   CBC  Recent Labs Lab 12/21/2014 0555 01/08/2015 0231  WBC 9.1 11.0*  HGB 12.5* 12.5*  HCT 39.6 39.7  PLT 302 318  MCV 96.6 96.8  MCH 30.5 30.5  MCHC 31.6 31.5  RDW 16.8* 16.8*  LYMPHSABS 1.5  --   MONOABS 0.8  --   EOSABS 0.2  --   BASOSABS 0.1  --  Chemistries   Recent Labs Lab 12/29/2014 0555 12/30/2014 0231  NA 134* 133*  K 5.7* 5.9*  CL 95* 96*  CO2 23 18*  GLUCOSE 212* 207*  BUN 34* 40*  CREATININE 6.86* 7.20*  CALCIUM 9.2 9.3  AST 17  --   ALT 14*  --   ALKPHOS 88  --   BILITOT 1.1  --    ------------------------------------------------------------------------------------------------------------------ estimated creatinine clearance is 13.6 mL/min (by C-G formula based on Cr of 7.2). ------------------------------------------------------------------------------------------------------------------  Recent Labs  12/18/2014 1755  HGBA1C 9.1*   ------------------------------------------------------------------------------------------------------------------ No results for input(s): CHOL, HDL, LDLCALC, TRIG, CHOLHDL, LDLDIRECT in the last 72 hours. ------------------------------------------------------------------------------------------------------------------  Recent Labs  01/01/2015 1755  TSH 1.129   ------------------------------------------------------------------------------------------------------------------ No results for input(s): VITAMINB12, FOLATE, FERRITIN, TIBC, IRON, RETICCTPCT in the last 72 hours.  Coagulation profile  Recent Labs Lab 12/27/2014 0555  INR 1.40    No results for input(s): DDIMER in the last 72 hours.  Cardiac Enzymes  Recent Labs Lab 12/24/2014 0555  TROPONINI 0.15*   ------------------------------------------------------------------------------------------------------------------ Invalid input(s): POCBNP   Time Spent in minutes  35   Quintasia Theroux K M.D on 12/22/2014 at 9:26 AM  Between 7am  to 7pm - Pager - [phone]  After 7pm go to [email or web address] - password [facility]  [facility] -  Office  [phone]

## 2014-12-31 NOTE — Progress Notes (Signed)
ANTICOAGULATION CONSULT NOTE - Initial Consult  Pharmacy Consult for heparin Indication: ACS/PAF history on warfarin pta  No Known Allergies  Patient Measurements: Height: 6' (182.9 cm) Weight: 255 lb 1.2 oz (115.7 kg) IBW/kg (Calculated) : 77.6 Heparin Dosing Weight: 102kg  Vital Signs: Temp: 97.5 F (36.4 C) (07/18 2300) Temp Source: Oral (07/18 2300) BP: 115/74 mmHg (07/18 2300) Pulse Rate: 68 (07/18 2300)  Labs:  Recent Labs  12/26/2014 0555 12/18/2014 1755 12/29/2014 0231  HGB 12.5*  --  12.5*  HCT 39.6  --  39.7  PLT 302  --  318  LABPROT 17.3*  --   --   INR 1.40  --   --   HEPARINUNFRC  --  0.25* 0.31  CREATININE 6.86*  --  7.20*  TROPONINI 0.15*  --   --     Estimated Creatinine Clearance: 13.6 mL/min (by C-G formula based on Cr of 7.2).   Assessment: 36 yom on heparin for ACS/PAF history (warfarin pta on hold). Heparin level 0.31 (low end of therapeutic) on 2400 units/hr. CBC stable. No bleeding noted. Cath scheduled for 1030 today.  Goal of Therapy:  Heparin level 0.3-0.7 units/ml Monitor platelets by anticoagulation protocol: Yes   Plan:  Continue Heparin to 2400 units/h F/u post cath  Sherlon Handing, PharmD, BCPS Clinical pharmacist, pager [phone] 12/26/2014 4:04 AM

## 2014-12-31 NOTE — Consult Note (Signed)
JENCARLOS NICOLSON 01/08/2015 Rexene Agent Requesting Physician:  Ron Parker MD  Reason for Consult:  comanagement of ESRD HPI:  64 [facility] [place] when he developed hypotension and hemoptysis. He was evaluated at an outside hospital demonstrating concerns for an STEMI, new reduced left ventricular ejection fraction, severe aortic stenosis. Previously, he has a cardiac history of paroxysmal atrial tachycardia and permanent atrial fibrillation for which he is on warfarin. He was transferred to [facility] yesterday for ongoing evaluation of his cardiac issues.  He tells me his last hemodialysis was yesterday. It was curtailed for transport to [facility]. His outpatient dialysis records are reviewed, he attends full treatments and is adherent. He has been leaving above his targeted dry weight recently but his post weights have been consistentl and stable. He uses a fistula in the left upper extremity. He currently is able to lie flat in the bed and denies any dyspnea. Plan is for catheterization of the left and right heart today as well as a transthoracic echocardiogram. He is on a heparin drip.   He also has severe PAD with a history of BKA on the left and a chronic ulcer on the right followed by Dr. Sharol Given.  Filed Weights   12/13/2014 1610 01/09/2015 2130  Weight: 113.8 kg (250 lb 14.1 oz) 115.7 kg (255 lb 1.2 oz)    I/O last 3 completed shifts: In: 456 [P.O.:240; I.V.:216] Out: -   ROS Balance of 12 systems is negative w/ exceptions as above  Outpt HD Orders Unit: [facility] Days: [facility] Time: 4h74min Dialyzer: F180 EDW: 101.5kg K/Ca: 2/2 Access: LUE AVF Needle Size: 15g BFR/DFR: 500/a1.5 UF Proflie: 2 VDRA: Hectorol 9 qTx EPO: No ESA IV Fe: No standing Fe Heparin: No heparin during Tx Most Recent Phos / PTH: 10.4 / 528 Most Recent TSAT: 41 Most Recent eKT/V: 1.37 Treatment Adherence: fair  PMH   Past Medical History  Diagnosis Date  . GERD (gastroesophageal reflux disease)   . Hypertension   . Neuromuscular disorder     neurophathy  . Atrial fibrillation     chronically anticoagulated with coumadin  . Hyperlipidemia   . Atrial flutter 07/07/2009    s/p ablation  . GOUT 08/01/2008  . ERECTILE DYSFUNCTION 08/01/2008  . PERIPHERAL NEUROPATHY 08/01/2008  . CORONARY ARTERY DISEASE 08/01/2008    non obstructive per cath 1/10; Dr. Olevia Perches  . 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 Gastroparesis 08/01/2008  . NEPHROLITHIASIS, HX OF 08/01/2008  . Chronic systolic heart failure 0/08/90  . COPD 07/21/2010  . Diverticulosis of colon (without mention of hemorrhage) 10/28/2010  . Benign neoplasm of colon 10/28/2010  . Hemorrhoids, internal 10/28/2010  . Second degree Mobitz I AV block     asymptomatic  . Cyst     "I've had 4-5; pretty much all of them have got infected" (06/13/2013)  . Muscle spasm of back     back muscle spasms and pt is unable to lay flat   . Abscess of back     lower middle portion of back  . Peripheral vascular disease     poor circulation  . Pneumonia     "first time I've been dx'd was today" (06/13/2013)  . Shortness of breath     "just related to this pneumonia" (06/13/2013)  . Type II diabetes mellitus dx'd 1980's  . Migraines     "  none since the 1980's" (06/13/2013)  . End stage renal failure on dialysis 08/01/2008    "[place]; TTS" (06/13/2013)  . Renal cyst     bilateral  . Basal cell carcinoma     nose  . Depression     situaltional    PSH  Past Surgical History  Procedure Laterality Date  . Atrial ablation surgery  07-2008, 07/08/09    Dr Rayann Heman  . Foot surgery Right 09-2009    infection got into the bone; had to put rod in to hold foot together" (06/13/2013)  . Colonoscopy  2011?    Dr.Orr  . Av fistula placement Left 2010    upper arm  . Elbow fracture surgery Right 1974  . Pilonidal cyst excision  1971  . Cataract  extraction Right ~ 2012  . I&d extremity Left 12/29/2012    Procedure: IRRIGATION AND DEBRIDEMENT EXTREMITY;  Surgeon: Newt Minion, MD;  Location: [place];  Service: Orthopedics;  Laterality: Left;  Irrigation and Debridement Left Leg, VAC, [place]  . Toe amputation Right 01/18/2013    "little toe" Dr Sharol Given  . I&d extremity Bilateral 01/18/2013    Procedure: IRRIGATION AND DEBRIDEMENT EXTREMITYLEFT LEG AND RIGHT FOOT;  Surgeon: Newt Minion, MD;  Location: [place];  Service: Orthopedics;  Laterality: Bilateral;  Left Leg Excisional Debridement  . Skin graft Left     left leg within the past month  . Amputation Left 02/02/2013    Procedure: AMPUTATION BELOW KNEE;  Surgeon: Newt Minion, MD;  Location: [place];  Service: Orthopedics;  Laterality: Left;  Left Below Knee Amputation  . Fracture surgery    . Vasectomy    . Amputation Left 03/23/2013    Procedure: REVISION OF BELOW THE KNEE  AMPUTATION ;  Surgeon: Newt Minion, MD;  Location: [place];  Service: Orthopedics;  Laterality: Left;  Left Below Knee Amputation Revision  . I&d extremity Left 05/04/2013    Procedure: Irrigation and Debridement Left Below Knee Ampuation;  Surgeon: Newt Minion, MD;  Location: [place];  Service: Orthopedics;  Laterality: Left;  Irrigation and Debridement Left Below Knee Ampuation  . Amputation Left 05/30/2013    Procedure: REVISION AMPUTATION BELOW KNEE;  Surgeon: Newt Minion, MD;  Location: [place];  Service: Orthopedics;  Laterality: Left;  Revision Left Below Knee Amputation, Possible Above Knee Amputation  . Tonsillectomy    . Amputation Left 02/13/2014    Procedure: Left Long Finger Amputation PIP Joint;  Surgeon: Newt Minion, MD;  Location: [place];  Service: Orthopedics;  Laterality: Left;  . Revison of arteriovenous fistula Left 40/98/1191    Procedure: PLICATION, RESECTION & BANDING ,OF LEFT UPPER ARM BASILIC VEIN TRANSPOSITION;  Surgeon: Serafina Mitchell, MD;  Location: [facility];  Service: Vascular;  Laterality:  Left;   FH  Family History  Problem Relation Age of Onset  . Diabetes Maternal Grandmother   . Diabetes Paternal Grandmother   . Cancer Neg Hx     no FH of Colon Cancer  . Diabetes Father    SH  reports that he quit smoking about 3 weeks ago. His smoking use included Cigarettes. He has a 22 pack-year smoking history. He has never used smokeless tobacco. He reports that he does not drink alcohol or use illicit drugs. Allergies No Known Allergies Home medications Prior to Admission medications   Medication Sig Start Date End Date Taking? Authorizing Provider  albuterol (PROVENTIL HFA;VENTOLIN HFA) 108 (90 BASE) MCG/ACT  inhaler Inhale 2 puffs into the lungs every 4 (four) hours as needed for wheezing or shortness of breath. Patient taking differently: Inhale 2 puffs into the lungs daily as needed for wheezing or shortness of breath.  06/17/13  Yes Shanker Kristeen Mans, MD  calcium acetate (PHOSLO) 667 MG capsule Take 1,334 mg by mouth 3 (three) times daily with meals.    Yes Historical Provider, MD  cinacalcet (SENSIPAR) 30 MG tablet Take 30 mg by mouth at bedtime.   Yes Historical Provider, MD  COUMADIN 5 MG tablet TAKE AS DIRECTED BY COUMADIN CLINIC Patient taking differently: TAKE 1/2 TABLET (2.5 MG) ON TUES & THURS AFTER SUPPER,  TAKE 1 TABLET (5 MG) ON ALL OTHER DAYS OR AS DIRECTED BY COUMADIN CLINIC 12/03/14  Yes Thompson Grayer, MD  folic acid-vitamin b complex-vitamin c-selenium-zinc (DIALYVITE) 3 MG TABS tablet Take 1 tablet by mouth at bedtime.    Yes Historical Provider, MD  gabapentin (NEURONTIN) 300 MG capsule Take 300 mg by mouth at bedtime.    Yes Historical Provider, MD  HYDROcodone-acetaminophen (NORCO) 5-325 MG per tablet Take 1 tablet by mouth every 6 (six) hours as needed. Patient taking differently: Take 0.5 tablets by mouth 2 (two) times daily as needed (pain).  05/03/14  Yes Alvia Grove, PA-C  lanthanum (FOSRENOL) 1000 MG chewable tablet Chew 2,000 mg by mouth 3 (three)  times daily with meals.   Yes Historical Provider, MD  loperamide (IMODIUM A-D) 2 MG tablet Take 2 mg by mouth daily as needed for diarrhea or loose stools.   Yes Historical Provider, MD  NOVOLOG FLEXPEN 100 UNIT/ML FlexPen INJECT 30 UNITS INTO THE SKIN 3 TIMES DAILY BEFORE MEALS. 12/03/14  Yes Renato Shin, MD  omeprazole (PRILOSEC OTC) 20 MG tablet Take 20 mg by mouth at bedtime.   Yes Historical Provider, MD  temazepam (RESTORIL) 30 MG capsule Take 30 mg by mouth at bedtime as needed for sleep.    Yes Historical Provider, MD  tiotropium (SPIRIVA) 18 MCG inhalation capsule Place 1 capsule (18 mcg total) into inhaler and inhale daily. Patient taking differently: Place 18 mcg into inhaler and inhale at bedtime.  06/17/13  Yes Shanker Kristeen Mans, MD  varenicline (CHANTIX) 0.5 MG tablet Take 1 tablet (0.5 mg total) by mouth daily. Patient taking differently: Take 0.5 mg by mouth at bedtime.  09/30/14  Yes Imogene Burn, PA-C  omeprazole (PRILOSEC) 20 MG capsule Take 1 capsule (20 mg total) by mouth daily. Patient not taking: Reported on 12/31/2014 03/29/14   Renato Shin, MD  UNIFINE PENTIPS 31G X 5 MM MISC USE AS DIRECTED 3 TIMES A DAY 07/09/14   Renato Shin, MD    Current Medications Scheduled Meds: . antiseptic oral rinse  7 mL Mouth Rinse BID  . aspirin EC  81 mg Oral Daily  . calcium acetate  1,334 mg Oral TID WC  . cinacalcet  30 mg Oral Q breakfast  . gabapentin  300 mg Oral QHS  . insulin aspart  0-20 Units Subcutaneous TID WC  . insulin aspart  0-5 Units Subcutaneous QHS  . insulin aspart  5 Units Subcutaneous TID WC  . metoprolol tartrate  12.5 mg Oral BID  . multivitamin  1 tablet Oral QHS  . pantoprazole  40 mg Oral BID  . sodium chloride  3 mL Intravenous Q12H  . tiotropium  18 mcg Inhalation Daily   Continuous Infusions: . sodium chloride    . heparin 2,400 Units/hr (12/13/2014 0600)  PRN Meds:.sodium chloride, acetaminophen, albuterol, ALPRAZolam, HYDROcodone-acetaminophen,  loperamide, nitroGLYCERIN, ondansetron (ZOFRAN) IV, polyethylene glycol, sodium chloride, temazepam, zolpidem  CBC  Recent Labs Lab 01/06/2015 0555 12/14/2014 0231  WBC 9.1 11.0*  NEUTROABS 6.4  --   HGB 12.5* 12.5*  HCT 39.6 39.7  MCV 96.6 96.8  PLT 302 073   Basic Metabolic Panel  Recent Labs Lab 12/26/2014 0555 12/19/2014 0231  NA 134* 133*  K 5.7* 5.9*  CL 95* 96*  CO2 23 18*  GLUCOSE 212* 207*  BUN 34* 40*  CREATININE 6.86* 7.20*  CALCIUM 9.2 9.3    Physical Exam  Blood pressure 112/63, pulse 78, temperature 97.4 F (36.3 C), temperature source Oral, resp. rate 12, height 6' (1.829 m), weight 115.7 kg (255 lb 1.2 oz), SpO2 98 %. GEN: NAD ENT: NCAT EYES: EOMI CV: RRR, 3/6 MSM, no rub PULM: CTAB ant ABD: s/nt/nd SKIN: no rashes/lesios; RLE EXT:no LEE   A/P 1. ESRD: for TTE and LHC/RHC today; tentative for HD later today or first thing in AM.  K 5.9 2. HTN/Vol: Probably need to continue to reduce post HD weights, 3L UF goal today 3. Anemia: Hb stable no ESA as outpt 4. MBD:  1. Chronic hyperphos: cont binders 2. On ciancalcet 3. Cont VDRA with HD 5. Reduced LVEF, Reversible Ischemia, NSTEMI 1. For LHC and RHC 2. TTE 3. UF as per #2 6. ? New severe AS: TTE today 7. Permanent AFib on Coumadin as outpt, hep gtt here  Pearson Grippe MD 01/07/2015, 8:23 AM

## 2014-12-31 NOTE — Progress Notes (Signed)
Site area: Right groin a 5 french arterial and a 7 french venous sheath was removed  Site Prior to Removal:  Level 0  Pressure Applied For 15 MINUTES    Minutes Beginning at 1310p Manual:   Yes.    Patient Status During Pull:  stable  Post Pull Groin Site:  Level 0  Post Pull Instructions Given:  Yes.    Post Pull Pulses Present:  Yes.    Dressing Applied:  Yes.    Comments:  VS remain stable during sheath pull. Pt denies any discomfort at site at this time.

## 2014-12-31 NOTE — Progress Notes (Signed)
ANTICOAGULATION CONSULT NOTE  Pharmacy Consult for heparin Indication: ACS/PAF history on warfarin pta  No Known Allergies  Patient Measurements: Height: 6' (182.9 cm) Weight: 255 lb 1.2 oz (115.7 kg) IBW/kg (Calculated) : 77.6 Heparin Dosing Weight: 102kg  Vital Signs: Temp: 97.4 F (36.3 C) (07/19 0818) Temp Source: Oral (07/19 0818) BP: 89/17 mmHg (07/19 1430) Pulse Rate: 75 (07/19 1430)  Labs:  Recent Labs  12/16/2014 0555 01/01/2015 1755 12/30/2014 0231  HGB 12.5*  --  12.5*  HCT 39.6  --  39.7  PLT 302  --  318  LABPROT 17.3*  --   --   INR 1.40  --   --   HEPARINUNFRC  --  0.25* 0.31  CREATININE 6.86*  --  7.20*  TROPONINI 0.15*  --   --     Estimated Creatinine Clearance: 13.6 mL/min (by C-G formula based on Cr of 7.2).  Assessment: 9 yom on heparin for ACS/PAF history (warfarin pta on hold). Heparin level therapeutic this morning. Now s/p cath and to resume heparin 8 hours post sheath removal. Sheath out ~1300.  No overt bleeding noted.  Goal of Therapy:  Heparin level 0.3-0.7 units/ml Monitor platelets by anticoagulation protocol: Yes   Plan:  -restart heparin at 2100 tonight at 2400 units/hr -daily HL and CBC  Chizaram Latino D. Jemiah Ellenburg, PharmD, BCPS Clinical Pharmacist Pager: [phone] 12/31/2014 3:01 PM

## 2014-12-31 NOTE — H&P (View-Only) (Signed)
Subjective:  Still notes SOB with any activity. NO cp.  Objective:  Vital Signs in the last 24 hours: Temp:  [97.4 F (36.3 C)-98 F (36.7 C)] 97.4 F (36.3 C) (07/19 0818) Pulse Rate:  [32-105] 78 (07/19 0818) Resp:  [12-21] 12 (07/19 0818) BP: (93-138)/(34-98) 112/63 mmHg (07/19 0818) SpO2:  [85 %-100 %] 98 % (07/19 0818) Weight:  [113.8 kg (250 lb 14.1 oz)-115.7 kg (255 lb 1.2 oz)] 115.7 kg (255 lb 1.2 oz) (07/18 2130)  Intake/Output from previous day: 07/18 0701 - 07/19 0700 In: 480 [P.O.:240; I.V.:240] Out: -  Intake/Output from this shift: Total I/O In: 24 [I.V.:24] Out: -   Physical Exam: General appearance: alert, cooperative, appears stated age, mild distress and moderately obese Neck: no adenopathy and , soft carotid bruit vs. radiated murmu; unable to assess JVP due to body habitus,  Lungs: mild interstitial sounds bilaterally, no W/R/R; non-labored Heart: Irreg/Irreg, normal rate, ~2-3/SEM @ RUSB-> carotids, no R/G; unable to accurately place PMI due to body habitus. Abdomen: soft, non-tender; bowel sounds normal; no masses,  no organomegaly Extremities: L BKA - intact; L warm to mid thigh - knee down cool, mottled skin, unalbe to palpate popliteal A pulse or below.  no sensation Pulses: Bilateral femoral bruit, unable to palpate RLE distal pulses Skin: RLE mottled, purple & cool Neurologic: Grossly normal  Lab Results:  Recent Labs  12/18/2014 0555 01/06/2015 0231  WBC 9.1 11.0*  HGB 12.5* 12.5*  PLT 302 318    Recent Labs  01/04/2015 0555 12/14/2014 0231  NA 134* 133*  K 5.7* 5.9*  CL 95* 96*  CO2 23 18*  GLUCOSE 212* 207*  BUN 34* 40*  CREATININE 6.86* 7.20*    Recent Labs  01/03/2015 0555  TROPONINI 0.15*   Hepatic Function Panel  Recent Labs  12/31/2014 0555  PROT 6.8  ALBUMIN 3.2*  AST 17  ALT 14*  ALKPHOS 88  BILITOT 1.1   No results for input(s): CHOL in the last 72 hours. No results for input(s): PROTIME in the last 72  hours.  Imaging: - no new studies.  Cardiac Studies: Echo & [facility] pending  Assessment/Plan:  Principal Problem:   Severe aortic stenosis by prior echocardiogram Active Problems:   Type 2 diabetes with complication   CAD- non obstructive 2010   Permanent atrial fibrillation   ESRD on dialysis   Abnormal nuclear cardiac imaging test   Cardiomyopathy- etiology to be determined   Critical lower limb ischemia   Hyperlipidemia with target LDL less than 70   Essential hypertension   SVT/ PSVT/ PAT   Ulcer of Rt foot- Dr Sharol Given follows   Diabetes mellitus type 2, insulin dependent   Smoking   Warfarin anticoagulation   Second degree Mobitz I AV block   Elevated troponin   PVD- Lt BKA 2014 Emphysema/COPD - chronic  Very complex scenario - no prior evidence of CAD, Cardiomyopathy or valvular disease & now Dx with severe AS & Severe CM (presumably combination of Ischemic & valvular).  Has recurrent Afib after ablation - was on warfarin.  S/p L BKA with evidence of RLE critical limb ischemia.  Has ESRD on HD - Nephrology on-board  His presentation was near syncope & also notes profound DOE including orthopnea & PND.  Denies any CP or RLE pain (state RLE has been cool &unable to feel x ~3 months & ulcer is not acute).   Primary issue is evaluation of AS/Cardiomyopathy with Abnormal Nuc ST:  Transferred for plan R&LHC today - Symptomatic AS (? Low output making gradient less severe)  Obtain local Echo for CT Sgx use for ? AVR/CABG  On IV Heparin, ASA  On low dose BB - convert to appropriate agent pre-d/c, no BP room for ACE-I/ARB   Afib - rate controlled,on IV heparin BP controlled & stable on BB (would convert to bisoprolol or Toprol prior to d/c) Will add statin   CLI - Dr. Sharol Given recommends Vasc Sgx involvment - consulted.  Will probably need Cardiac Eval completed first  DM - on SSI with meal coverage, no long-acting (managed per [facility])  COPD - on Spiriva     LOS: 1 day     [name] W 01/04/2015, 9:10 AM

## 2014-12-31 NOTE — Assessment & Plan Note (Deleted)
S/p Ablation - now back in Afib

## 2014-12-31 NOTE — Consult Note (Signed)
Reason for Consult: Ulceration right foot second toe Referring Physician: Dr. Corky Mull is an 64 y.o. male.  HPI: Patient is a 64 year old gentleman well-known to me status post left transtibial amputation who has had an ulcer on the right foot second toe. Patient was admitted for cardiac workup.  Past Medical History  Diagnosis Date  . GERD (gastroesophageal reflux disease)   . Hypertension   . Neuromuscular disorder     neurophathy  . Atrial fibrillation     chronically anticoagulated with coumadin  . Hyperlipidemia   . Atrial flutter 07/07/2009    s/p ablation  . GOUT 08/01/2008  . ERECTILE DYSFUNCTION 08/01/2008  . PERIPHERAL NEUROPATHY 08/01/2008  . CORONARY ARTERY DISEASE 08/01/2008    non obstructive per cath 1/10; Dr. Olevia Perches  . 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 Gastroparesis 08/01/2008  . NEPHROLITHIASIS, HX OF 08/01/2008  . Chronic systolic heart failure 12/12/2456  . COPD 07/21/2010  . Diverticulosis of colon (without mention of hemorrhage) 10/28/2010  . Benign neoplasm of colon 10/28/2010  . Hemorrhoids, internal 10/28/2010  . Second degree Mobitz I AV block     asymptomatic  . Cyst     "I've had 4-5; pretty much all of them have got infected" (06/13/2013)  . Muscle spasm of back     back muscle spasms and pt is unable to lay flat   . Abscess of back     lower middle portion of back  . Peripheral vascular disease     poor circulation  . Pneumonia     "first time I've been dx'd was today" (06/13/2013)  . Shortness of breath     "just related to this pneumonia" (06/13/2013)  . Type II diabetes mellitus dx'd 1980's  . Migraines     "none since the 1980's" (06/13/2013)  . End stage renal failure on dialysis 08/01/2008    "[facility]; TTS" (06/13/2013)  . Renal cyst     bilateral  . Basal cell carcinoma     nose  . Depression     situaltional     Past Surgical History  Procedure Laterality Date  . Atrial ablation surgery   07-2008, 07/08/09    Dr Rayann Heman  . Foot surgery Right 09-2009    infection got into the bone; had to put rod in to hold foot together" (06/13/2013)  . Colonoscopy  2011?    Dr.Orr  . Av fistula placement Left 2010    upper arm  . Elbow fracture surgery Right 1974  . Pilonidal cyst excision  1971  . Cataract extraction Right ~ 2012  . I&d extremity Left 12/29/2012    Procedure: IRRIGATION AND DEBRIDEMENT EXTREMITY;  Surgeon: Newt Minion, MD;  Location: [facility];  Service: Orthopedics;  Laterality: Left;  Irrigation and Debridement Left Leg, VAC, [place]  . Toe amputation Right 01/18/2013    "little toe" Dr Sharol Given  . I&d extremity Bilateral 01/18/2013    Procedure: IRRIGATION AND DEBRIDEMENT EXTREMITYLEFT LEG AND RIGHT FOOT;  Surgeon: Newt Minion, MD;  Location: [facility];  Service: Orthopedics;  Laterality: Bilateral;  Left Leg Excisional Debridement  . Skin graft Left     left leg within the past month  . Amputation Left 02/02/2013    Procedure: AMPUTATION BELOW KNEE;  Surgeon: Newt Minion, MD;  Location: [facility];  Service: Orthopedics;  Laterality: Left;  Left Below Knee Amputation  . Fracture surgery    .  Vasectomy    . Amputation Left 03/23/2013    Procedure: REVISION OF BELOW THE KNEE  AMPUTATION ;  Surgeon: Newt Minion, MD;  Location: [facility];  Service: Orthopedics;  Laterality: Left;  Left Below Knee Amputation Revision  . I&d extremity Left 05/04/2013    Procedure: Irrigation and Debridement Left Below Knee Ampuation;  Surgeon: Newt Minion, MD;  Location: [facility];  Service: Orthopedics;  Laterality: Left;  Irrigation and Debridement Left Below Knee Ampuation  . Amputation Left 05/30/2013    Procedure: REVISION AMPUTATION BELOW KNEE;  Surgeon: Newt Minion, MD;  Location: [facility];  Service: Orthopedics;  Laterality: Left;  Revision Left Below Knee Amputation, Possible Above Knee Amputation  . Tonsillectomy    . Amputation Left 02/13/2014    Procedure: Left Long Finger Amputation PIP Joint;   Surgeon: Newt Minion, MD;  Location: [facility];  Service: Orthopedics;  Laterality: Left;  . Revison of arteriovenous fistula Left 29/92/4268    Procedure: PLICATION, RESECTION & BANDING ,OF LEFT UPPER ARM BASILIC VEIN TRANSPOSITION;  Surgeon: Serafina Mitchell, MD;  Location: [facility];  Service: Vascular;  Laterality: Left;    Family History  Problem Relation Age of Onset  . Diabetes Maternal Grandmother   . Diabetes Paternal Grandmother   . Cancer Neg Hx     no FH of Colon Cancer  . Diabetes Father     Social History:  reports that he quit smoking about 3 weeks ago. His smoking use included Cigarettes. He has a 22 pack-year smoking history. He has never used smokeless tobacco. He reports that he does not drink alcohol or use illicit drugs.  Allergies: No Known Allergies  Medications: I have reviewed the patient's current medications.  Results for orders placed or performed during the hospital encounter of 12/31/2014 (from the past 48 hour(s))  Comprehensive metabolic panel     Status: Abnormal   Collection Time: 01/10/2015  5:55 AM  Result Value Ref Range   Sodium 134 (L) 135 - 145 mmol/L   Potassium 5.7 (H) 3.5 - 5.1 mmol/L   Chloride 95 (L) 101 - 111 mmol/L   CO2 23 22 - 32 mmol/L   Glucose, Bld 212 (H) 65 - 99 mg/dL   BUN 34 (H) 6 - 20 mg/dL   Creatinine, Ser 6.86 (H) 0.61 - 1.24 mg/dL   Calcium 9.2 8.9 - 10.3 mg/dL   Total Protein 6.8 6.5 - 8.1 g/dL   Albumin 3.2 (L) 3.5 - 5.0 g/dL   AST 17 15 - 41 U/L   ALT 14 (L) 17 - 63 U/L   Alkaline Phosphatase 88 38 - 126 U/L   Total Bilirubin 1.1 0.3 - 1.2 mg/dL   GFR calc non Af Amer 8 (L) >60 mL/min   GFR calc Af Amer 9 (L) >60 mL/min    Comment: (NOTE) The eGFR has been calculated using the CKD EPI equation. This calculation has not been validated in all clinical situations. eGFR's persistently <60 mL/min signify possible Chronic Kidney Disease.    Anion gap 16 (H) 5 - 15  Troponin I     Status: Abnormal   Collection Time:  12/23/2014  5:55 AM  Result Value Ref Range   Troponin I 0.15 (H) <0.031 ng/mL    Comment:        PERSISTENTLY INCREASED TROPONIN VALUES IN THE RANGE OF 0.04-0.49 ng/mL CAN BE SEEN IN:       -UNSTABLE ANGINA       -  CONGESTIVE HEART FAILURE       -MYOCARDITIS       -CHEST TRAUMA       -ARRYHTHMIAS       -LATE PRESENTING MYOCARDIAL INFARCTION       -COPD   CLINICAL FOLLOW-UP RECOMMENDED.   Protime-INR     Status: Abnormal   Collection Time: 12/16/2014  5:55 AM  Result Value Ref Range   Prothrombin Time 17.3 (H) 11.6 - 15.2 seconds   INR 1.40 0.00 - 1.49  CBC WITH DIFFERENTIAL     Status: Abnormal   Collection Time: 01/03/2015  5:55 AM  Result Value Ref Range   WBC 9.1 4.0 - 10.5 K/uL   RBC 4.10 (L) 4.22 - 5.81 MIL/uL   Hemoglobin 12.5 (L) 13.0 - 17.0 g/dL   HCT 39.6 39.0 - 52.0 %   MCV 96.6 78.0 - 100.0 fL   MCH 30.5 26.0 - 34.0 pg   MCHC 31.6 30.0 - 36.0 g/dL   RDW 16.8 (H) 11.5 - 15.5 %   Platelets 302 150 - 400 K/uL   Neutrophils Relative % 71 43 - 77 %   Neutro Abs 6.4 1.7 - 7.7 K/uL   Lymphocytes Relative 17 12 - 46 %   Lymphs Abs 1.5 0.7 - 4.0 K/uL   Monocytes Relative 9 3 - 12 %   Monocytes Absolute 0.8 0.1 - 1.0 K/uL   Eosinophils Relative 2 0 - 5 %   Eosinophils Absolute 0.2 0.0 - 0.7 K/uL   Basophils Relative 1 0 - 1 %   Basophils Absolute 0.1 0.0 - 0.1 K/uL  MRSA PCR Screening     Status: None   Collection Time: 12/17/2014  3:49 PM  Result Value Ref Range   MRSA by PCR NEGATIVE NEGATIVE    Comment:        The GeneXpert MRSA Assay (FDA approved for NASAL specimens only), is one component of a comprehensive MRSA colonization surveillance program. It is not intended to diagnose MRSA infection nor to guide or monitor treatment for MRSA infections.   Glucose, capillary     Status: Abnormal   Collection Time: 12/16/2014  4:30 PM  Result Value Ref Range   Glucose-Capillary 194 (H) 65 - 99 mg/dL   Comment 1 Capillary Specimen   TSH     Status: None   Collection  Time: 01/08/2015  5:55 PM  Result Value Ref Range   TSH 1.129 0.350 - 4.500 uIU/mL  Heparin level (unfractionated)     Status: Abnormal   Collection Time: 12/27/2014  5:55 PM  Result Value Ref Range   Heparin Unfractionated 0.25 (L) 0.30 - 0.70 IU/mL    Comment:        IF HEPARIN RESULTS ARE BELOW EXPECTED VALUES, AND PATIENT DOSAGE HAS BEEN CONFIRMED, SUGGEST FOLLOW UP TESTING OF ANTITHROMBIN III LEVELS.   Glucose, capillary     Status: Abnormal   Collection Time: 12/20/2014  9:20 PM  Result Value Ref Range   Glucose-Capillary 278 (H) 65 - 99 mg/dL   Comment 1 Notify RN   Basic metabolic panel     Status: Abnormal   Collection Time: 12/21/2014  2:31 AM  Result Value Ref Range   Sodium 133 (L) 135 - 145 mmol/L   Potassium 5.9 (H) 3.5 - 5.1 mmol/L   Chloride 96 (L) 101 - 111 mmol/L   CO2 18 (L) 22 - 32 mmol/L   Glucose, Bld 207 (H) 65 - 99 mg/dL   BUN 40 (H)  6 - 20 mg/dL   Creatinine, Ser 7.20 (H) 0.61 - 1.24 mg/dL   Calcium 9.3 8.9 - 10.3 mg/dL   GFR calc non Af Amer 7 (L) >60 mL/min   GFR calc Af Amer 8 (L) >60 mL/min    Comment: (NOTE) The eGFR has been calculated using the CKD EPI equation. This calculation has not been validated in all clinical situations. eGFR's persistently <60 mL/min signify possible Chronic Kidney Disease.    Anion gap 19 (H) 5 - 15  CBC     Status: Abnormal   Collection Time: 12/13/2014  2:31 AM  Result Value Ref Range   WBC 11.0 (H) 4.0 - 10.5 K/uL   RBC 4.10 (L) 4.22 - 5.81 MIL/uL   Hemoglobin 12.5 (L) 13.0 - 17.0 g/dL   HCT 39.7 39.0 - 52.0 %   MCV 96.8 78.0 - 100.0 fL   MCH 30.5 26.0 - 34.0 pg   MCHC 31.5 30.0 - 36.0 g/dL   RDW 16.8 (H) 11.5 - 15.5 %   Platelets 318 150 - 400 K/uL  Heparin level (unfractionated)     Status: None   Collection Time: 01/03/2015  2:31 AM  Result Value Ref Range   Heparin Unfractionated 0.31 0.30 - 0.70 IU/mL    Comment:        IF HEPARIN RESULTS ARE BELOW EXPECTED VALUES, AND PATIENT DOSAGE HAS BEEN  CONFIRMED, SUGGEST FOLLOW UP TESTING OF ANTITHROMBIN III LEVELS.     No results found.  Review of Systems  All other systems reviewed and are negative.  Blood pressure 118/98, pulse 75, temperature 98 F (36.7 C), temperature source Oral, resp. rate 15, height 6' (1.829 m), weight 115.7 kg (255 lb 1.2 oz), SpO2 92 %. Physical Exam   On examination the left lower extremity patient has a stable left transtibial amputation.  On examination patient's right lower extremity is cold and mottled. There is ischemic changes of the entire forefoot. Patient has warmth in the right thigh but is cold distal to the popliteal fossa on the right.  A Doppler was used and I could not Doppler a dorsalis pedis or posterior tibial pulse.  Assessment/Plan: Assessment: Critical limb ischemia right lower extremity with stable ulcer right second toe without signs of infection.  Plan: Patient is scheduled for cardiac catheterization today. I asked the nursing staff to call the patient's cardiologist to to notify regarding the critical limb ischemia right lower extremity.  Lounell Schumacher V 01/07/2015, 6:41 AM

## 2014-12-31 NOTE — Progress Notes (Signed)
Subjective:  Still notes SOB with any activity. NO cp.  Objective:  Vital Signs in the last 24 hours: Temp:  [97.4 F (36.3 C)-98 F (36.7 C)] 97.4 F (36.3 C) (07/19 0818) Pulse Rate:  [32-105] 78 (07/19 0818) Resp:  [12-21] 12 (07/19 0818) BP: (93-138)/(34-98) 112/63 mmHg (07/19 0818) SpO2:  [85 %-100 %] 98 % (07/19 0818) Weight:  [113.8 kg (250 lb 14.1 oz)-115.7 kg (255 lb 1.2 oz)] 115.7 kg (255 lb 1.2 oz) (07/18 2130)  Intake/Output from previous day: 07/18 0701 - 07/19 0700 In: 480 [P.O.:240; I.V.:240] Out: -  Intake/Output from this shift: Total I/O In: 24 [I.V.:24] Out: -   Physical Exam: General appearance: alert, cooperative, appears stated age, mild distress and moderately obese Neck: no adenopathy and , soft carotid bruit vs. radiated murmu; unable to assess JVP due to body habitus,  Lungs: mild interstitial sounds bilaterally, no W/R/R; non-labored Heart: Irreg/Irreg, normal rate, ~2-3/SEM @ RUSB-> carotids, no R/G; unable to accurately place PMI due to body habitus. Abdomen: soft, non-tender; bowel sounds normal; no masses,  no organomegaly Extremities: L BKA - intact; L warm to mid thigh - knee down cool, mottled skin, unalbe to palpate popliteal A pulse or below.  no sensation Pulses: Bilateral femoral bruit, unable to palpate RLE distal pulses Skin: RLE mottled, purple & cool Neurologic: Grossly normal  Lab Results:  Recent Labs  01/09/2015 0555 01/08/2015 0231  WBC 9.1 11.0*  HGB 12.5* 12.5*  PLT 302 318    Recent Labs  12/15/2014 0555 12/20/2014 0231  NA 134* 133*  K 5.7* 5.9*  CL 95* 96*  CO2 23 18*  GLUCOSE 212* 207*  BUN 34* 40*  CREATININE 6.86* 7.20*    Recent Labs  12/23/2014 0555  TROPONINI 0.15*   Hepatic Function Panel  Recent Labs  12/14/2014 0555  PROT 6.8  ALBUMIN 3.2*  AST 17  ALT 14*  ALKPHOS 88  BILITOT 1.1   No results for input(s): CHOL in the last 72 hours. No results for input(s): PROTIME in the last 72  hours.  Imaging: - no new studies.  Cardiac Studies: Echo & [facility] pending  Assessment/Plan:  Principal Problem:   Severe aortic stenosis by prior echocardiogram Active Problems:   Type 2 diabetes with complication   CAD- non obstructive 2010   Permanent atrial fibrillation   ESRD on dialysis   Abnormal nuclear cardiac imaging test   Cardiomyopathy- etiology to be determined   Critical lower limb ischemia   Hyperlipidemia with target LDL less than 70   Essential hypertension   SVT/ PSVT/ PAT   Ulcer of Rt foot- Dr Sharol Given follows   Diabetes mellitus type 2, insulin dependent   Smoking   Warfarin anticoagulation   Second degree Mobitz I AV block   Elevated troponin   PVD- Lt BKA 2014 Emphysema/COPD - chronic  Very complex scenario - no prior evidence of CAD, Cardiomyopathy or valvular disease & now Dx with severe AS & Severe CM (presumably combination of Ischemic & valvular).  Has recurrent Afib after ablation - was on warfarin.  S/p L BKA with evidence of RLE critical limb ischemia.  Has ESRD on HD - Nephrology on-board  His presentation was near syncope & also notes profound DOE including orthopnea & PND.  Denies any CP or RLE pain (state RLE has been cool &unable to feel x ~3 months & ulcer is not acute).   Primary issue is evaluation of AS/Cardiomyopathy with Abnormal Nuc ST:  Transferred for plan R&LHC today - Symptomatic AS (? Low output making gradient less severe)  Obtain local Echo for CT Sgx use for ? AVR/CABG  On IV Heparin, ASA  On low dose BB - convert to appropriate agent pre-d/c, no BP room for ACE-I/ARB   Afib - rate controlled,on IV heparin BP controlled & stable on BB (would convert to bisoprolol or Toprol prior to d/c) Will add statin   CLI - Dr. Sharol Given recommends Vasc Sgx involvment - consulted.  Will probably need Cardiac Eval completed first  DM - on SSI with meal coverage, no long-acting (managed per TRH)  COPD - on Spiriva     LOS: 1 day     HARDING, DAVID W 12/14/2014, 9:10 AM

## 2014-12-31 NOTE — Progress Notes (Addendum)
Dr Ellyn Hack made aware about the right foot cold to touch, partly purplish in color.Right dorsalis pedis by doppler as checked by  Two RN. Denied any pain.

## 2014-12-31 NOTE — Interval H&P Note (Signed)
Cath Lab Visit (complete for each Cath Lab visit)  Clinical Evaluation Leading to the Procedure:   ACS: No.  Non-ACS:    Anginal Classification: CCS IV  Anti-ischemic medical therapy: Maximal Therapy (2 or more classes of medications)  Non-Invasive Test Results: No non-invasive testing performed  Prior CABG: No previous CABG      History and Physical Interval Note:  01/04/2015 11:43 AM  [name]  has presented today for surgery, with the diagnosis of cp  The various methods of treatment have been discussed with the patient and family. After consideration of risks, benefits and other options for treatment, the patient has consented to  Procedure(s): Right/Left Heart Cath and Coronary Angiography (N/A) as a surgical intervention .  The patient's history has been reviewed, patient examined, no change in status, stable for surgery.  I have reviewed the patient's chart and labs.  Questions were answered to the patient's satisfaction.     Eulogio Requena A

## 2014-12-31 NOTE — Procedures (Signed)
I was present at this dialysis session. I have reviewed the session itself and made appropriate changes.   LHC/RHC w/ severe MV CAD, confirmed very low LVEF, inc RV pressurs /pHTN.    Attempt 3L UF, BP very soft.  Will follow closely  Pearson Grippe  MD 12/17/2014, 4:50 PM

## 2015-01-01 ENCOUNTER — Encounter (HOSPITAL_COMMUNITY): Payer: Self-pay | Admitting: Cardiovascular Disease

## 2015-01-01 ENCOUNTER — Inpatient Hospital Stay (HOSPITAL_COMMUNITY): Payer: Medicare Other

## 2015-01-01 DIAGNOSIS — E119 Type 2 diabetes mellitus without complications: Secondary | ICD-10-CM

## 2015-01-01 DIAGNOSIS — Z794 Long term (current) use of insulin: Secondary | ICD-10-CM

## 2015-01-01 DIAGNOSIS — Z7901 Long term (current) use of anticoagulants: Secondary | ICD-10-CM

## 2015-01-01 DIAGNOSIS — I38 Endocarditis, valve unspecified: Secondary | ICD-10-CM

## 2015-01-01 DIAGNOSIS — I25118 Atherosclerotic heart disease of native coronary artery with other forms of angina pectoris: Secondary | ICD-10-CM

## 2015-01-01 DIAGNOSIS — I340 Nonrheumatic mitral (valve) insufficiency: Secondary | ICD-10-CM

## 2015-01-01 DIAGNOSIS — I739 Peripheral vascular disease, unspecified: Secondary | ICD-10-CM

## 2015-01-01 DIAGNOSIS — I350 Nonrheumatic aortic (valve) stenosis: Secondary | ICD-10-CM

## 2015-01-01 DIAGNOSIS — I351 Nonrheumatic aortic (valve) insufficiency: Secondary | ICD-10-CM

## 2015-01-01 DIAGNOSIS — I998 Other disorder of circulatory system: Secondary | ICD-10-CM

## 2015-01-01 DIAGNOSIS — R7989 Other specified abnormal findings of blood chemistry: Secondary | ICD-10-CM

## 2015-01-01 HISTORY — DX: Nonrheumatic aortic (valve) stenosis: I35.0

## 2015-01-01 LAB — RENAL FUNCTION PANEL
ALBUMIN: 2.9 g/dL — AB (ref 3.5–5.0)
Anion gap: 16 — ABNORMAL HIGH (ref 5–15)
BUN: 31 mg/dL — ABNORMAL HIGH (ref 6–20)
CALCIUM: 8.4 mg/dL — AB (ref 8.9–10.3)
CO2: 23 mmol/L (ref 22–32)
CREATININE: 6.34 mg/dL — AB (ref 0.61–1.24)
Chloride: 94 mmol/L — ABNORMAL LOW (ref 101–111)
GFR calc Af Amer: 10 mL/min — ABNORMAL LOW (ref >60)
GFR calc non Af Amer: 8 mL/min — ABNORMAL LOW (ref >60)
GLUCOSE: 252 mg/dL — AB (ref 65–99)
PHOSPHORUS: 6 mg/dL — AB (ref 2.5–4.6)
Potassium: 5.2 mmol/L — ABNORMAL HIGH (ref 3.5–5.1)
SODIUM: 133 mmol/L — AB (ref 135–145)

## 2015-01-01 LAB — GLUCOSE, CAPILLARY
GLUCOSE-CAPILLARY: 105 mg/dL — AB (ref 65–99)
Glucose-Capillary: 179 mg/dL — ABNORMAL HIGH (ref 65–99)
Glucose-Capillary: 156 mg/dL — ABNORMAL HIGH (ref 65–99)
Glucose-Capillary: 260 mg/dL — ABNORMAL HIGH (ref 65–99)
Glucose-Capillary: 214 mg/dL — ABNORMAL HIGH (ref 65–99)
Glucose-Capillary: 144 mg/dL — ABNORMAL HIGH (ref 65–99)

## 2015-01-01 LAB — CBC
HCT: 38.4 % — ABNORMAL LOW (ref 39.0–52.0)
Hemoglobin: 11.9 g/dL — ABNORMAL LOW (ref 13.0–17.0)
MCH: 30 pg (ref 26.0–34.0)
MCHC: 31 g/dL (ref 30.0–36.0)
MCV: 96.7 fL (ref 78.0–100.0)
Platelets: 279 10*3/uL (ref 150–400)
RBC: 3.97 MIL/uL — ABNORMAL LOW (ref 4.22–5.81)
RDW: 17 % — AB (ref 11.5–15.5)
WBC: 12.1 10*3/uL — ABNORMAL HIGH (ref 4.0–10.5)

## 2015-01-01 LAB — HEPARIN LEVEL (UNFRACTIONATED)
HEPARIN UNFRACTIONATED: 0.37 [IU]/mL (ref 0.30–0.70)
Heparin Unfractionated: 0.24 IU/mL — ABNORMAL LOW (ref 0.30–0.70)

## 2015-01-01 MED ORDER — INSULIN GLARGINE 100 UNIT/ML ~~LOC~~ SOLN
12.0000 [IU] | Freq: Every day | SUBCUTANEOUS | Status: DC
  Administered 2015-01-01 – 2015-01-02 (×2): 12 [IU] via SUBCUTANEOUS
  Filled 2015-01-01 (×4): qty 0.12

## 2015-01-01 MED ORDER — PERFLUTREN LIPID MICROSPHERE
INTRAVENOUS | Status: AC
  Administered 2015-01-01: 2 mL
  Filled 2015-01-01: qty 10

## 2015-01-01 MED ORDER — PERFLUTREN LIPID MICROSPHERE
1.0000 mL | INTRAVENOUS | Status: AC | PRN
  Filled 2015-01-01: qty 10

## 2015-01-01 MED FILL — Heparin Sodium (Porcine) 2 Unit/ML in Sodium Chloride 0.9%: INTRAMUSCULAR | Qty: 500 | Status: AC

## 2015-01-01 NOTE — Consult Note (Signed)
[facility]  Subjective  - No pain in foot   Objective 126/93 62 98.2 F (36.8 C) (Oral) 23 97%  Intake/Output Summary (Last 24 hours) at 01/01/15 1234 Last data filed at 01/01/15 1100  Gross per 24 hour  Intake  810.4 ml  Output   3501 ml  Net -2690.6 ml   Right foot unchanged still dusky in appearance in forefoot  Assessment/Planning: Most likely non salvageable right foot not pressed for urgent treatment currently as he has no pain and no ascending infection.  Will most likely need BKA/AKA but would consider doing diagnostic arteriogram and possible intervention once cardiac issues are settled.  High likeliehood of limb loss discussed with pt.  Ruta Hinds 01/01/2015 12:34 PM --  Laboratory Lab Results:  Recent Labs  01/02/2015 0231 01/01/15 0213  WBC 11.0* 12.1*  HGB 12.5* 11.9*  HCT 39.7 38.4*  PLT 318 279   BMET  Recent Labs  01/03/2015 0231 01/01/15 0213  NA 133* 133*  K 5.9* 5.2*  CL 96* 94*  CO2 18* 23  GLUCOSE 207* 252*  BUN 40* 31*  CREATININE 7.20* 6.34*  CALCIUM 9.3 8.4*    COAG Lab Results  Component Value Date   INR 1.40 12/29/2014   INR 4.2* 12/10/2014   INR 2.4* 11/26/2014   No results found for: PTT

## 2015-01-01 NOTE — Progress Notes (Signed)
  Echocardiogram 2D Echocardiogram with Definity has been performed.  Jennette Dubin 01/01/2015, 11:13 AM

## 2015-01-01 NOTE — Progress Notes (Signed)
Complained of  sudden stubbing chest pain on left chest radiating to left neck  while echo has been done.  bp-139/74 nitro 1 sl given with relief.continue to monitor.

## 2015-01-01 NOTE — Progress Notes (Signed)
ANTICOAGULATION CONSULT NOTE - Follow Up Consult  Pharmacy Consult for heparin Indication: chest pain/ACS and atrial fibrillation    Labs:  Recent Labs  12/29/2014 0555 12/28/2014 1755 12/15/2014 0231 01/01/15 0213  HGB 12.5*  --  12.5* 11.9*  HCT 39.6  --  39.7 38.4*  PLT 302  --  318 279  LABPROT 17.3*  --   --   --   INR 1.40  --   --   --   HEPARINUNFRC  --  0.25* 0.31 0.24*  CREATININE 6.86*  --  7.20* 6.34*  TROPONINI 0.15*  --   --   --     Assessment/Plan:  64yo male subtherapeutic on heparin after resumed though lab was drawn <6hr after started and likely needs more time to accumulate. Will continue gtt at current rate and check additional level.   Wynona Neat, PharmD, BCPS  01/01/2015,3:51 AM

## 2015-01-01 NOTE — Progress Notes (Signed)
Admit: 12/29/2014 LOS: 2  29M ESRD on iHD with NSTEMI, MV CAD, severe AS, new acute systolic HF  Subjective:  HD yesterday,  3.5L UF, Full Tx; bed post weight 111.4kg  To see CT Surgery today Cardiology notes reviewed   07/19 0701 - 07/20 0700 In: 546.4 [P.O.:240; I.V.:306.4] Out: 3501 [Stool:1]  Filed Weights   12/22/2014 2130 12/19/2014 1601 01/08/2015 2025  Weight: 115.7 kg (255 lb 1.2 oz) 114.9 kg (253 lb 4.9 oz) 111.4 kg (245 lb 9.5 oz)    Scheduled Meds: . antiseptic oral rinse  7 mL Mouth Rinse BID  . aspirin  81 mg Oral Daily  . atorvastatin  80 mg Oral q1800  . calcium acetate  1,334 mg Oral TID WC  . cinacalcet  30 mg Oral Q breakfast  . gabapentin  300 mg Oral QHS  . insulin aspart  0-20 Units Subcutaneous TID WC  . insulin aspart  0-5 Units Subcutaneous QHS  . insulin aspart  2 Units Subcutaneous TID WC  . metoprolol tartrate  12.5 mg Oral BID  . multivitamin  1 tablet Oral QHS  . pantoprazole  40 mg Oral BID  . sodium chloride  3 mL Intravenous Q12H  . sodium chloride  3 mL Intravenous Q12H  . sodium chloride  3 mL Intravenous Q12H  . tiotropium  18 mcg Inhalation Daily   Continuous Infusions: . heparin 2,400 Units/hr (01/03/2015 2114)   PRN Meds:.sodium chloride, sodium chloride, sodium chloride, sodium chloride, sodium chloride, acetaminophen, albuterol, ALPRAZolam, feeding supplement (NEPRO CARB STEADY), heparin, HYDROcodone-acetaminophen, lidocaine (PF), lidocaine-prilocaine, loperamide, nitroGLYCERIN, ondansetron (ZOFRAN) IV, pentafluoroprop-tetrafluoroeth, polyethylene glycol, sodium chloride, sodium chloride, sodium chloride, temazepam, zolpidem  Current Labs: reviewed    Physical Exam:  Blood pressure 110/43, pulse 87, temperature 98.2 F (36.8 C), temperature source Oral, resp. rate 13, height 6' (1.829 m), weight 111.4 kg (245 lb 9.5 oz), SpO2 97 %. GEN: NAD ENT: NCAT EYES: EOMI CV: RRR, 3/6 MSM, no rub PULM: CTAB ant ABD: s/nt/nd SKIN: no  rashes/lesios; RLE EXT:no LEE  Outpt HD Orders Unit: [facility] Days: THS Time: 4h38min Dialyzer: F180 EDW: 101.5kg K/Ca: 2/2 Access: LUE AVF Needle Size: 15g BFR/DFR: 500/a1.5 UF Proflie: 2 VDRA: Hectorol 9 qTx EPO: No ESA IV Fe: No standing Fe Heparin: No heparin during Tx Most Recent Phos / PTH: 10.4 / 528 Most Recent TSAT: 41 Most Recent eKT/V: 1.37 Treatment Adherence: fair  A/P 1. ESRD:  1. Done THS, extra Tx today for more fluid removal 2. Using AVF 3. No bolus heparin, on gtt 2. HTN/Vol:  1. As above 2. On MTP 3. Anemia: Hb stable no ESA as outpt 4. MBD:  1. Chronic hyperphos: cont binders 2. On ciancalcet 3. Cont VDRA with HD 5. Reduced LVEF, NSTEMI, severe AS, MV CAD 1. To see CT Surgery today 2. TTE today 3. UF as per #'s 1&2 6. Permanent AFib on Coumadin as outpt, hep gtt here  Pearson Grippe MD 01/01/2015, 9:49 AM   Recent Labs Lab 12/21/2014 0555 01/11/2015 0231 01/01/15 0213  NA 134* 133* 133*  K 5.7* 5.9* 5.2*  CL 95* 96* 94*  CO2 23 18* 23  GLUCOSE 212* 207* 252*  BUN 34* 40* 31*  CREATININE 6.86* 7.20* 6.34*  CALCIUM 9.2 9.3 8.4*  PHOS  --   --  6.0*    Recent Labs Lab 01/01/2015 0555 12/21/2014 0231 01/01/15 0213  WBC 9.1 11.0* 12.1*  NEUTROABS 6.4  --   --   HGB 12.5*  12.5* 11.9*  HCT 39.6 39.7 38.4*  MCV 96.6 96.8 96.7  PLT 302 318 279

## 2015-01-01 NOTE — Progress Notes (Signed)
Subjective: No chest pain, some SOB this AM - had dialysis yesterday  Objective: Vital signs in last 24 hours: Temp:  [97.4 F (36.3 C)-98 F (36.7 C)] 97.6 F (36.4 C) (07/20 0422) Pulse Rate:  [68-87] 87 (07/20 0750) Resp:  [12-21] 13 (07/20 0750) BP: (86-151)/(17-86) 110/43 mmHg (07/20 0750) SpO2:  [94 %-100 %] 99 % (07/20 0750) Weight:  [245 lb 9.5 oz (111.4 kg)-253 lb 4.9 oz (114.9 kg)] 245 lb 9.5 oz (111.4 kg) (07/19 2025) Weight change: 2 lb 6.8 oz (1.1 kg) Last BM Date: 12/17/2014 Intake/Output from previous day: -2954 07/19 0701 - 07/20 0700 In: 546.4 [P.O.:240; I.V.:306.4] Out: 3501 [Stool:1] Intake/Output this shift:    PE: General:Pleasant affect, NAD, lying flat Skin:Warm and dry, brisk capillary refill HEENT:normocephalic, sclera clear, mucus membranes moist Neck:supple, no JVD  Heart:mildly irreg irreg with 3/6 squeaky murmur, no gallup, rub or click Lungs:diminished with crackles, no rhonchi, or wheezes BWI:OMBT, non tender, + BS, do not palpate liver spleen or masses Ext:no lower ext edema, Lt BKA, rt foot with sock. 2+ radial pulses Neuro:alert and oriented X 3, MAE, follows commands, + facial symmetry   Tele: a fib rate controlled  EKG a fib with T wave inversion lat leads. Lab Results:  Recent Labs  12/27/2014 0231 01/01/15 0213  WBC 11.0* 12.1*  HGB 12.5* 11.9*  HCT 39.7 38.4*  PLT 318 279   BMET  Recent Labs  01/02/2015 0231 01/01/15 0213  NA 133* 133*  K 5.9* 5.2*  CL 96* 94*  CO2 18* 23  GLUCOSE 207* 252*  BUN 40* 31*  CREATININE 7.20* 6.34*  CALCIUM 9.3 8.4*    Recent Labs  01/07/2015 0555  TROPONINI 0.15*    Lab Results  Component Value Date   CHOL 143 03/08/2014   HDL 38.40* 03/08/2014   LDLCALC 82 03/08/2014   LDLDIRECT 116.3 12/22/2011   TRIG 115.0 03/08/2014   CHOLHDL 4 03/08/2014   Lab Results  Component Value Date   HGBA1C 9.1* 01/06/2015     Lab Results  Component Value Date   TSH 1.129 12/14/2014     Hepatic Function Panel  Recent Labs  01/07/2015 0555 01/01/15 0213  PROT 6.8  --   ALBUMIN 3.2* 2.9*  AST 17  --   ALT 14*  --   ALKPHOS 88  --   BILITOT 1.1  --    No results for input(s): CHOL in the last 72 hours. No results for input(s): PROTIME in the last 72 hours.     Studies/Results: No results found. CARDIAC CATH:  Ost 2nd Mrg to 2nd Mrg lesion, 100% stenosed.  LPDA-2 lesion, 90% stenosed.  LPDA-1 lesion, 70% stenosed.  Ost 4th Mrg lesion, 70% stenosed.  Prox RCA lesion, 75% stenosed.  Ost 2nd Diag to 2nd Diag lesion, 90% stenosed.  Dist LAD lesion, 90% stenosed.  Ost LAD lesion, 30% stenosed.  There is severe left ventricular systolic dysfunction.  Low gradient moderate aortic valve stenosis  Severe LV dysfunction with an ejection fraction of 10-15% with diffuse hypocontractility.  Low gradient at least moderate aortic valve stenosis with a calculated aortic valve area of 1.2 cm.  Significant elevation of right heart pressures and moderately severe to severe pulmonary hypertension with a PA systolic pressure of 60 mm Hg.  Severe coronary calcification with a short/common ostial left main, 30% proximal LAD stenosis, 90% proximal diagonal 2 stenosis and 90% mid LAD stenosis; occlusion of obtuse marginal branch of the circumflex  with distal marginal stenosis of 70 and 90% and 70% distal AV groove stenosis in a dominant left circumflex system; and 75% proximal RCA stenosis and a nondominant RCA.  RECOMMENDATION:  Consider surgical evaluation in this patient with a severe cardiomyopathy/AS/CAD. Reportedly in 2013 he had normal EF without aortic stenosis.   Medications: I have reviewed the patient's current medications. Scheduled Meds: . antiseptic oral rinse  7 mL Mouth Rinse BID  . aspirin  81 mg Oral Daily  . atorvastatin  80 mg Oral q1800  . calcium acetate  1,334 mg Oral TID WC  . cinacalcet  30 mg Oral Q breakfast  . gabapentin  300 mg  Oral QHS  . insulin aspart  0-20 Units Subcutaneous TID WC  . insulin aspart  0-5 Units Subcutaneous QHS  . insulin aspart  2 Units Subcutaneous TID WC  . metoprolol tartrate  12.5 mg Oral BID  . multivitamin  1 tablet Oral QHS  . pantoprazole  40 mg Oral BID  . sodium chloride  3 mL Intravenous Q12H  . sodium chloride  3 mL Intravenous Q12H  . sodium chloride  3 mL Intravenous Q12H  . tiotropium  18 mcg Inhalation Daily   Continuous Infusions: . heparin 2,400 Units/hr (12/26/2014 2114)   PRN Meds:.sodium chloride, sodium chloride, sodium chloride, sodium chloride, sodium chloride, acetaminophen, albuterol, ALPRAZolam, feeding supplement (NEPRO CARB STEADY), heparin, HYDROcodone-acetaminophen, lidocaine (PF), lidocaine-prilocaine, loperamide, nitroGLYCERIN, ondansetron (ZOFRAN) IV, pentafluoroprop-tetrafluoroeth, polyethylene glycol, sodium chloride, sodium chloride, sodium chloride, temazepam, zolpidem  Assessment/Plan: Principal Problem:   Severe aortic stenosis by prior echocardiogram Active Problems:   Type 2 diabetes with complication   Hyperlipidemia with target LDL less than 70   Essential hypertension   CAD- non obstructive 2010   SVT/ PSVT/ PAT   Ulcer of Rt foot- Dr Sharol Given follows   Permanent atrial fibrillation   Second degree Mobitz I AV block   Diabetes mellitus type 2, insulin dependent   ESRD on dialysis   Smoking   Warfarin anticoagulation   PVD- Lt BKA 2014   Abnormal nuclear cardiac imaging test   Cardiomyopathy- etiology to be determined   Critical lower limb ischemia   Elevated troponin   Very complex scenario - no prior evidence of CAD- now with significant diseas, Cardiomyopathy and valvular disease & now Dx with severe AS & Severe CM (presumably combination of Ischemic & valvular). Has recurrent Afib after ablation - was on warfarin now on heparin. S/p L BKA with evidence of RLE critical limb ischemia. TCTS consult was recommended- will have them see  today.     Has ESRD on HD - Nephrology on-board- dialysis yesterday; may need more volume removal today given   His presentation was near syncope & also notes profound DOE including orthopnea & PND. Denies any CP or RLE pain (state RLE has been cool &unable to feel x ~3 months & ulcer is not acute).   Afib - rate controlled,on IV heparin BP controlled & stable on BB (would convert to bisoprolol or Toprol prior to d/c) Will add statin   CLI - Dr. Sharol Given recommends Vasc Sgx involvment - Dr. Oneida Alar has seen and will follow up depending on cardiology/ TCTS.  DM - on SSI with meal coverage, no long-acting (managed per [facility]) with stable glucose.   COPD - on Spiriva   ICM- -  I & 0 Negative 2474      LOS: 2 days   Time spent with pt. :15 minutes. INGOLD,LAURA R  Nurse Practitioner Certified Pager [phone] or after 5pm and on weekends call [phone] 01/01/2015, 8:03 AM   I have seen, examined and evaluated the patient this AM along with Cecilie Kicks, NP.  After reviewing all the available data and chart,  I agree with her findings, examination as well as impression recommendations. I have also amended the note   Cath results reviewed -> Echo relook pending, as suspected with low EF - pressure gradient in cath lab is not as high as would be predicted.  Need good Echo image to evaluate severity of AS.  With presentation of Syncope with cardiomyopathy, multivessel CAD & at least Moderate, if not severe AS, still need to consider Surgical option.  TCTS Consulted.  Once we determine plan for cardiac Rx, will need to move toward determine COA for CLI.  I do agree that he probably needs increased volume removal on HD --> still with diminished basal BS & rales with dyspnea.  BP still too soft to additional Rx with afterload reduction / increased BB.   Leonie Man, M.D., M.S. Interventional Cardiologist   Pager # [phone]

## 2015-01-01 NOTE — Consult Note (Addendum)
[name]       [name]             [phone]          CARDIOTHORACIC SURGERY CONSULTATION REPORT  PCP is Renato Shin, MD Referring Provider is [facility], Leonie Green, MD Primary Cardiologist is Thompson Grayer, MD Primary Nephrologist is Fleet Contras, MD   Reason for consultation:  Aortic stenosis and coronary artery disease  HPI:  Patient is a 64 yo male with no previous h/o CAD but multiple chronic medical problems including hypertension, type II diabetes mellitus w/ complications, ESRD for which he has been dialysis-dependent since 2014, peripheral vascular disease s/p left BKA with chronic ischemia and ulceration of the right foot and permanent atrial fibrillation for which the patient is chronically anticoagulated with coumadin who has been referred for surgical consultation due to the recent discovery of severe ischemic cardiomyopathy, multi-vessel CAD and aortic stenosis.  The patient has been followed for several years by Dr. Rayann Heman.  He originally had paroxysmal atrial tachycardia and underwent ablation in 2011. He eventually developed permanent atrial fibrillation for which he has been chronically anticoagulated using warfarin. He underwent diagnostic cardiac catheterization revealing nonobstructive coronary artery disease in 2010. Stress myoview exam performed in 2013 was felt to be low risk. Transthoracic echocardiogram performed in 2013 demonstrated normal left ventricular systolic function with ejection fraction estimated 55-60%. There was reportedly no aortic stenosis at that time. The patient states that he has been declining steadily over the past 6 months or more. He has developed progressive shortness of breath and now feels short of breath most of the time. He has not had any chest pain or chest tightness. He has not had any dizzy spells, palpitations, or syncope. He has not had any problems with dialysis therapy until last week when he  developed hypotension during dialysis while he was staying in a condominium in [place]. He was admitted to [facility] where he had elevated troponin levels and an echocardiogram that reportedly demonstrated severe aortic stenosis and severe biventricular dysfunction with left ventricular ejection fraction estimated 15%. A stress myoview exam reportedly demonstrated signs consistent with ischemia in the left circumflex territory. Plans were made for diagnostic cardiac catheterization, but the patient was subsequently transferred to [facility] for further management. Repeat echocardiogram performed 01/01/2015 revealed aortic stenosis with mean transvalvular gradient estimated 12 mmHg. There was severe left ventricular dysfunction with ejection fraction estimated 20-25%. Diagnostic cardiac catheterization performed yesterday revealed severe left ventricular systolic dysfunction with ejection fraction estimated 10-15%. There was low flow low gradient aortic stenosis with aortic valve area calculated 1.2 cm. There was severe pulmonary hypertention with PA pressure measured 60 mmHg.  The cardiac output and the remainder of right heart cath data were not reported.  Cardiothoracic surgical consultation was requested this morning.  The patient is married and lives locally in [place] with his wife. He is disabled having previously worked in [employer]. He has been dialysis dependent for approximately 4 years and dialyzes on a Tuesday Thursday Saturday schedule at [facility]. The patient states that his quality of life is terrible and he has been very depressed. He underwent left below-knee amputation 2 years ago and has had problems with severe ischemia and chronic wound in the right foot. He has been evaluated by Vascular Surgery and told that he will likely require amputation on the right side. Up until recently he has still been  able to ambulate short distances using a prosthesis.  He describes a progressive decline over the past 6 months or more with worsening shortness of breath. He now gets short of breath with minimal activity and he frequently feels short of breath at rest. He has never had any chest pain or chest tightness until earlier this afternoon when he had a brief episode of left-sided chest discomfort just before he started dialysis. He has never had problems with dialysis treatments until the episode he had last week.  He states that his quality of life is so poor that he recently has had discussions with his nephrologist about stopping dialysis therapy.  Past Medical History  Diagnosis Date  . GERD (gastroesophageal reflux disease)   . Hypertension   . Neuromuscular disorder     neurophathy  . Atrial fibrillation     chronically anticoagulated with coumadin  . Hyperlipidemia   . Atrial flutter 07/07/2009    s/p ablation  . GOUT 08/01/2008  . ERECTILE DYSFUNCTION 08/01/2008  . PERIPHERAL NEUROPATHY 08/01/2008  . CORONARY ARTERY DISEASE 08/01/2008    non obstructive per cath 1/10; Dr. Olevia Perches  . 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 Gastroparesis 08/01/2008  . NEPHROLITHIASIS, HX OF 08/01/2008  . Chronic systolic heart failure 06/20/5100  . COPD 07/21/2010  . Diverticulosis of colon (without mention of hemorrhage) 10/28/2010  . Benign neoplasm of colon 10/28/2010  . Hemorrhoids, internal 10/28/2010  . Second degree Mobitz I AV block     asymptomatic  . Cyst     "I've had 4-5; pretty much all of them have got infected" (06/13/2013)  . Muscle spasm of back     back muscle spasms and pt is unable to lay flat   . Abscess of back     lower middle portion of back  . Peripheral vascular disease   . Pneumonia   . Shortness of breath   . Type II diabetes mellitus dx'd 1980's  . Migraines     "none since the 1980's" (06/13/2013)  . End stage renal failure on dialysis 08/01/2008    "[facility]; TTS" (06/13/2013)  . Renal cyst     bilateral    . Basal cell carcinoma     nose  . Depression     situaltional   . Aortic stenosis 01/01/2015  . Coronary atherosclerosis of native coronary artery 08/01/2008  . Cardiomyopathy, ischemic 12/18/2014  . S/P below knee amputation 02/02/2013    LEFT    Past Surgical History  Procedure Laterality Date  . Atrial ablation surgery  07-2008, 07/08/09    Dr Rayann Heman  . Foot surgery Right 09-2009    infection got into the bone; had to put rod in to hold foot together" (06/13/2013)  . Colonoscopy  2011?    Dr.Orr  . Av fistula placement Left 2010    upper arm  . Elbow fracture surgery Right 1974  . Pilonidal cyst excision  1971  . Cataract extraction Right ~ 2012  . I&d extremity Left 12/29/2012    Procedure: IRRIGATION AND DEBRIDEMENT EXTREMITY;  Surgeon: Newt Minion, MD;  Location: [place];  Service: Orthopedics;  Laterality: Left;  Irrigation and Debridement Left Leg, VAC, [place]  . Toe amputation Right 01/18/2013    "little toe" Dr Sharol Given  . I&d extremity Bilateral 01/18/2013    Procedure: IRRIGATION AND DEBRIDEMENT EXTREMITYLEFT LEG AND RIGHT FOOT;  Surgeon: Newt Minion, MD;  Location: [place];  Service:  Orthopedics;  Laterality: Bilateral;  Left Leg Excisional Debridement  . Skin graft Left     left leg within the past month  . Amputation Left 02/02/2013    Procedure: AMPUTATION BELOW KNEE;  Surgeon: Newt Minion, MD;  Location: [place];  Service: Orthopedics;  Laterality: Left;  Left Below Knee Amputation  . Fracture surgery    . Vasectomy    . Amputation Left 03/23/2013    Procedure: REVISION OF BELOW THE KNEE  AMPUTATION ;  Surgeon: Newt Minion, MD;  Location: [place];  Service: Orthopedics;  Laterality: Left;  Left Below Knee Amputation Revision  . I&d extremity Left 05/04/2013    Procedure: Irrigation and Debridement Left Below Knee Ampuation;  Surgeon: Newt Minion, MD;  Location: [place];  Service: Orthopedics;  Laterality: Left;  Irrigation and Debridement Left Below Knee Ampuation  .  Amputation Left 05/30/2013    Procedure: REVISION AMPUTATION BELOW KNEE;  Surgeon: Newt Minion, MD;  Location: [place];  Service: Orthopedics;  Laterality: Left;  Revision Left Below Knee Amputation, Possible Above Knee Amputation  . Tonsillectomy    . Amputation Left 02/13/2014    Procedure: Left Long Finger Amputation PIP Joint;  Surgeon: Newt Minion, MD;  Location: [place];  Service: Orthopedics;  Laterality: Left;  . Revison of arteriovenous fistula Left 95/18/8416    Procedure: PLICATION, RESECTION & BANDING ,OF LEFT UPPER ARM BASILIC VEIN TRANSPOSITION;  Surgeon: Serafina Mitchell, MD;  Location: [place] [facility];  Service: Vascular;  Laterality: Left;  . Cardiac catheterization N/A 12/22/2014    Procedure: Right/Left Heart Cath and Coronary Angiography;  Surgeon: Troy Sine, MD;  Location: [place] [facility];  Service: Cardiovascular;  Laterality: N/A;    Family History  Problem Relation Age of Onset  . Diabetes Maternal Grandmother   . Diabetes Paternal Grandmother   . Cancer Neg Hx     no FH of Colon Cancer  . Diabetes Father     History   Social History  . Marital Status: Married    Spouse Name: N/A  . Number of Children: 3  . Years of Education: N/A   Occupational History  . Sales Rep    Social History Main Topics  . Smoking status: Former Smoker -- 0.50 packs/day for 44 years    Types: Cigarettes    Quit date: 12/09/2014  . Smokeless tobacco: Never Used  . Alcohol Use: No     Comment: 06/13/2013 "no alcohol in 40 yrs"  . Drug Use: No     Comment: 06/13/2013 "used marijuana years ago"  . Sexual Activity: Not Currently   Other Topics Concern  . 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[employer]          Prior to Admission medications   Medication Sig Start Date End Date Taking? Authorizing Provider  albuterol (PROVENTIL HFA;VENTOLIN HFA) 108 (90 BASE) MCG/ACT inhaler Inhale 2 puffs into the lungs every 4 (four) hours as needed for wheezing or  shortness of breath. Patient taking differently: Inhale 2 puffs into the lungs daily as needed for wheezing or shortness of breath.  06/17/13  Yes Shanker Kristeen Mans, MD  calcium acetate (PHOSLO) 667 MG capsule Take 1,334 mg by mouth 3 (three) times daily with meals.    Yes Historical Provider, MD  cinacalcet (SENSIPAR) 30 MG tablet Take 30 mg by mouth at bedtime.   Yes Historical Provider, MD  COUMADIN 5 MG tablet  TAKE AS DIRECTED BY COUMADIN CLINIC Patient taking differently: TAKE 1/2 TABLET (2.5 MG) ON TUES & THURS AFTER SUPPER,  TAKE 1 TABLET (5 MG) ON ALL OTHER DAYS OR AS DIRECTED BY COUMADIN CLINIC 12/03/14  Yes Thompson Grayer, MD  folic acid-vitamin b complex-vitamin c-selenium-zinc (DIALYVITE) 3 MG TABS tablet Take 1 tablet by mouth at bedtime.    Yes Historical Provider, MD  gabapentin (NEURONTIN) 300 MG capsule Take 300 mg by mouth at bedtime.    Yes Historical Provider, MD  HYDROcodone-acetaminophen (NORCO) 5-325 MG per tablet Take 1 tablet by mouth every 6 (six) hours as needed. Patient taking differently: Take 0.5 tablets by mouth 2 (two) times daily as needed (pain).  05/03/14  Yes Alvia Grove, PA-C  lanthanum (FOSRENOL) 1000 MG chewable tablet Chew 2,000 mg by mouth 3 (three) times daily with meals.   Yes Historical Provider, MD  loperamide (IMODIUM A-D) 2 MG tablet Take 2 mg by mouth daily as needed for diarrhea or loose stools.   Yes Historical Provider, MD  NOVOLOG FLEXPEN 100 UNIT/ML FlexPen INJECT 30 UNITS INTO THE SKIN 3 TIMES DAILY BEFORE MEALS. 12/03/14  Yes Renato Shin, MD  omeprazole (PRILOSEC OTC) 20 MG tablet Take 20 mg by mouth at bedtime.   Yes Historical Provider, MD  temazepam (RESTORIL) 30 MG capsule Take 30 mg by mouth at bedtime as needed for sleep.    Yes Historical Provider, MD  tiotropium (SPIRIVA) 18 MCG inhalation capsule Place 1 capsule (18 mcg total) into inhaler and inhale daily. Patient taking differently: Place 18 mcg into inhaler and inhale at bedtime.   06/17/13  Yes Shanker Kristeen Mans, MD  varenicline (CHANTIX) 0.5 MG tablet Take 1 tablet (0.5 mg total) by mouth daily. Patient taking differently: Take 0.5 mg by mouth at bedtime.  09/30/14  Yes Imogene Burn, PA-C  omeprazole (PRILOSEC) 20 MG capsule Take 1 capsule (20 mg total) by mouth daily. Patient not taking: Reported on 01/03/2015 03/29/14   Renato Shin, MD  UNIFINE PENTIPS 31G X 5 MM MISC USE AS DIRECTED 3 TIMES A DAY 07/09/14   Renato Shin, MD    Current Facility-Administered Medications  Medication Dose Route Frequency Provider Last Rate Last Dose  . 0.9 %  sodium chloride infusion  250 mL Intravenous PRN Doreene Burke Kilroy, PA-C      . 0.9 %  sodium chloride infusion  100 mL Intravenous PRN Rexene Agent, MD      . 0.9 %  sodium chloride infusion  100 mL Intravenous PRN Rexene Agent, MD      . 0.9 %  sodium chloride infusion  250 mL Intravenous PRN Troy Sine, MD      . 0.9 %  sodium chloride infusion  250 mL Intravenous PRN Troy Sine, MD      . acetaminophen (TYLENOL) tablet 650 mg  650 mg Oral Q4H PRN Troy Sine, MD      . albuterol (PROVENTIL) (2.5 MG/3ML) 0.083% nebulizer solution 2.5 mg  2.5 mg Nebulization Q4H PRN Thurnell Lose, MD      . ALPRAZolam Duanne Moron) tablet 0.25 mg  0.25 mg Oral BID PRN Erlene Quan, PA-C   0.25 mg at 12/29/2014 2154  . antiseptic oral rinse (CPC / CETYLPYRIDINIUM CHLORIDE 0.05%) solution 7 mL  7 mL Mouth Rinse BID Thompson Grayer, MD   7 mL at 01/01/15 1000  . aspirin chewable tablet 81 mg  81 mg Oral Daily Troy Sine,  MD   81 mg at 01/01/15 1000  . atorvastatin (LIPITOR) tablet 80 mg  80 mg Oral q1800 Thompson Grayer, MD   80 mg at 01/03/2015 2114  . calcium acetate (PHOSLO) capsule 1,334 mg  1,334 mg Oral TID WC Thompson Grayer, MD   1,334 mg at 01/01/15 1317  . cinacalcet (SENSIPAR) tablet 30 mg  30 mg Oral Q breakfast Thurnell Lose, MD   30 mg at 01/01/15 0830  . feeding supplement (NEPRO CARB STEADY) liquid 237 mL  237 mL Oral PRN Rexene Agent, MD      . gabapentin (NEURONTIN) capsule 300 mg  300 mg Oral QHS Thurnell Lose, MD   300 mg at 12/15/2014 2114  . heparin ADULT infusion 100 units/mL (25000 units/250 mL)  2,400 Units/hr Intravenous Continuous Lauren D Bajbus, RPH 24 mL/hr at 12/28/2014 2114 2,400 Units/hr at 01/02/2015 2114  . heparin injection 1,000 Units  1,000 Units Dialysis PRN Rexene Agent, MD      . HYDROcodone-acetaminophen (NORCO/VICODIN) 5-325 MG per tablet 1 tablet  1 tablet Oral Q6H PRN Thurnell Lose, MD   1 tablet at 01/09/2015 2117  . insulin aspart (novoLOG) injection 0-20 Units  0-20 Units Subcutaneous TID WC Thurnell Lose, MD   7 Units at 01/01/15 1318  . insulin aspart (novoLOG) injection 0-5 Units  0-5 Units Subcutaneous QHS Thurnell Lose, MD   0 Units at 12/27/2014 2200  . insulin aspart (novoLOG) injection 2 Units  2 Units Subcutaneous TID WC Thurnell Lose, MD   2 Units at 01/01/15 1319  . insulin glargine (LANTUS) injection 12 Units  12 Units Subcutaneous QHS Nishant Dhungel, MD      . lidocaine (PF) (XYLOCAINE) 1 % injection 5 mL  5 mL Intradermal PRN Rexene Agent, MD      . lidocaine-prilocaine (EMLA) cream 1 application  1 application Topical PRN Rexene Agent, MD      . loperamide (IMODIUM) capsule 2 mg  2 mg Oral Daily PRN Thurnell Lose, MD   2 mg at 01/01/15 1138  . metoprolol tartrate (LOPRESSOR) tablet 12.5 mg  12.5 mg Oral BID Doreene Burke Kilroy, PA-C   12.5 mg at 01/01/15 1000  . multivitamin (RENA-VIT) tablet 1 tablet  1 tablet Oral QHS Thompson Grayer, MD   1 tablet at 12/13/2014 2114  . nitroGLYCERIN (NITROSTAT) SL tablet 0.4 mg  0.4 mg Sublingual Q5 Min x 3 PRN Erlene Quan, PA-C   0.4 mg at 01/01/15 1015  . ondansetron (ZOFRAN) injection 4 mg  4 mg Intravenous Q6H PRN Troy Sine, MD      . pantoprazole (PROTONIX) EC tablet 40 mg  40 mg Oral BID Thurnell Lose, MD   40 mg at 01/01/15 1000  . pentafluoroprop-tetrafluoroeth (GEBAUERS) aerosol 1 application  1 application Topical  PRN Rexene Agent, MD      . polyethylene glycol (MIRALAX / GLYCOLAX) packet 17 g  17 g Oral Daily PRN Thurnell Lose, MD      . sodium chloride 0.9 % injection 3 mL  3 mL Intravenous Q12H Luke K Kilroy, PA-C   3 mL at 12/20/2014 0930  . sodium chloride 0.9 % injection 3 mL  3 mL Intravenous PRN Doreene Burke Kilroy, PA-C      . sodium chloride 0.9 % injection 3 mL  3 mL Intravenous Q12H Troy Sine, MD   3 mL at 01/01/15 1003  . sodium  chloride 0.9 % injection 3 mL  3 mL Intravenous PRN Troy Sine, MD      . sodium chloride 0.9 % injection 3 mL  3 mL Intravenous Q12H Troy Sine, MD   3 mL at 01/01/15 1003  . sodium chloride 0.9 % injection 3 mL  3 mL Intravenous PRN Troy Sine, MD      . temazepam (RESTORIL) capsule 30 mg  30 mg Oral QHS PRN Thurnell Lose, MD      . tiotropium [facility]) inhalation capsule 18 mcg  18 mcg Inhalation Daily Thurnell Lose, MD   18 mcg at 01/01/15 9326  . zolpidem (AMBIEN) tablet 5 mg  5 mg Oral QHS PRN,MR X 1 Luke K Kilroy, PA-C        No Known Allergies    Review of Systems:   General:  poor appetite, no energy, no weight gain, no weight loss, no fever  Cardiac:  no chest pain with exertion, first episode of chest pain occurred today prior to dialysis, + SOB with any exertion, + resting SOB, no PND, no orthopnea, no palpitations, + arrhythmia, + atrial fibrillation, no LE edema, no dizzy spells, no syncope  Respiratory:  + shortness of breath, no home oxygen, no productive cough, no dry cough, no bronchitis, no wheezing, no hemoptysis, no asthma, no pain with inspiration or cough, no sleep apnea, no CPAP at night  GI:   no difficulty swallowing, no reflux, no frequent heartburn, no hiatal hernia, no abdominal pain, no constipation, no diarrhea, no hematochezia, + hematemesis, no melena  GU:   Does not make urine  + end-stage kidney disease  Vascular:  no pain suggestive of claudication, no pain in feet, no leg cramps, no varicose veins, no DVT, +  non-healing foot ulcer, s/p left BKA  Neuro:   no stroke, no TIA's, no seizures, no headaches, no temporary blindness one eye,  no slurred speech, no peripheral neuropathy, no chronic pain, + instability of gait, no memory/cognitive dysfunction  Musculoskeletal: no arthritis, no joint swelling, no myalgias, + difficulty walking, very limited mobility   Skin:   + rash, + itching, + skin infections, + pressure sores or ulcerations  Psych:   no anxiety, + depression, no nervousness, + unusual recent stress  Eyes:   + blurry vision, no floaters, + recent vision changes, + wears glasses or contacts  ENT:   no hearing loss, no loose or painful teeth, no dentures  Hematologic:  + easy bruising, no abnormal bleeding, no clotting disorder, no frequent epistaxis  Endocrine:  + diabetes, checks CBG's at home     Physical Exam:   BP 98/47 mmHg  Pulse 79  Temp(Src) 97.1 F (36.2 C) (Oral)  Resp 16  Ht 6' (1.829 m)  Wt 113 kg (249 lb 1.9 oz)  BMI 33.78 kg/m2  SpO2 99%  General:  Chronically ill-appearing  HEENT:  Unremarkable   Neck:   no JVD, no bruits, no adenopathy   Chest:   clear to auscultation, symmetrical breath sounds, no wheezes, no rhonchi   CV:   RRR, grade II/VI systolic murmur   Abdomen:  soft, non-tender, no masses   Extremities:  S/P left BKA, open wound right foot which is poorly-perfused, pulses not palpable, + lower extremity edema  Rectal/GU  Deferred  Neuro:   Grossly non-focal and symmetrical throughout  Skin:   Clean and dry, no rashes, no breakdown  Diagnostic Tests:  Transthoracic Echocardiography  Patient:  [name], [name] MR #:    [id number] Study Date: 01/01/2015 Gender:   M Age:    8 Height:   185.4 cm Weight:   111.1 kg BSA:    2.42 m^2 Pt. Status: Room:  Bernette Redbird K ADMITTING  Thompson Grayer, MD ATTENDING  Thompson Grayer, MD PERFORMING  [facility], Inpatient SONOGRAPHER Mikki Santee  cc:  ------------------------------------------------------------------- LV EF: 20% -  25%  ------------------------------------------------------------------- Indications:   Aortic stenosis 424.1.  ------------------------------------------------------------------- History:  PMH:  Dyspnea. Congestive heart failure. Chronic obstructive pulmonary disease. Risk factors: Diabetes mellitus.  ------------------------------------------------------------------- Study Conclusions  - Left ventricle: The cavity size was mildly dilated. There was mild concentric hypertrophy. Systolic function was severely reduced. The estimated ejection fraction was in the range of 20% to 25%. Diffuse hypokinesis. Doppler parameters are consistent with elevated ventricular end-diastolic filling pressure. - Aortic valve: Trileaflet; severely thickened, severely calcified leaflets. Valve mobility was restricted. There was mild stenosis. Mean gradient (S): 12 mm Hg. Peak gradient (S): 19 mm Hg. Valve area (VTI): 1.44 cm^2. Valve area (Vmax): 1.72 cm^2. Valve area (Vmean): 1.47 cm^2. - Aortic root: The aortic root was normal in size. - Mitral valve: Structurally normal valve. There was mild regurgitation. - Left atrium: The atrium was mildly dilated. - Right ventricle: The cavity size was normal. Wall thickness was normal. Systolic function was moderately reduced. - Right atrium: The atrium was mildly dilated. - Tricuspid valve: There was moderate regurgitation. - Pulmonary arteries: Systolic pressure was mildly increased. PA peak pressure: 44 mm Hg (S). - Inferior vena cava: The vessel was dilated. The respirophasic diameter changes were blunted (< 50%), consistent with elevated central venous pressure. - Impressions: Severely decreased LVEF with LVEF 20-25%. Diffuse hypokinesis. Moderately decreased RVEF. Mild aortic stenosis by gradients, however  most probably underestimated by low flow and possibly severe. Mild pulmonary hypertension.  This is a significant change when compared to the prior study from 2013 - with normal LV and RVEF and no aortic stenosis.  Impressions:  - Severely decreased LVEF with LVEF 20-25%. Diffuse hypokinesis. Moderately decreased RVEF. Mild aortic stenosis by gradients, however most probably underestimated by low flow and possibly severe. Mild pulmonary hypertension.  This is a significant change when compared to the prior study from 2013 - with normal LV and RVEF and no aortic stenosis.  Transthoracic echocardiography. M-mode, complete 2D, spectral Doppler, and color Doppler. Birthdate: Patient birthdate: 01/11/1951. Age: Patient is 64 yr old. Sex: Gender: male. BMI: 32.3 kg/m^2. Blood pressure:   110/43 Patient status: Inpatient. Study date: Study date: 01/01/2015. Study time: 10:00 AM. Location: ICU/CCU  -------------------------------------------------------------------  ------------------------------------------------------------------- Left ventricle: The cavity size was mildly dilated. There was mild concentric hypertrophy. Systolic function was severely reduced. The estimated ejection fraction was in the range of 20% to 25%. Diffuse hypokinesis. The study was not technically sufficient to allow evaluation of LV diastolic dysfunction due to atrial fibrillation. Doppler parameters are consistent with elevated ventricular end-diastolic filling pressure.  ------------------------------------------------------------------- Aortic valve:  Trileaflet; severely thickened, severely calcified leaflets. Valve mobility was restricted. Doppler:  There was mild stenosis.  There was no regurgitation.  VTI ratio of LVOT to aortic valve: 0.38. Valve area (VTI): 1.44 cm^2. Indexed valve area (VTI): 0.59 cm^2/m^2. Peak velocity ratio of LVOT to aortic  valve: 0.45. Valve area (Vmax): 1.72 cm^2. Indexed valve area (Vmax): 0.71 cm^2/m^2. Mean velocity ratio of LVOT to aortic valve: 0.39. Valve area (Vmean): 1.47 cm^2. Indexed valve  area (Vmean): 0.61 cm^2/m^2.  Mean gradient (S): 12 mm Hg. Peak gradient (S): 19 mm Hg.  ------------------------------------------------------------------- Aorta: Aortic root: The aortic root was normal in size.  ------------------------------------------------------------------- Mitral valve:  Structurally normal valve.  Mobility was not restricted. Doppler: Transvalvular velocity was within the normal range. There was no evidence for stenosis. There was mild regurgitation.  Peak gradient (D): 9 mm Hg.  ------------------------------------------------------------------- Left atrium: The atrium was mildly dilated.  ------------------------------------------------------------------- Right ventricle: The cavity size was normal. Wall thickness was normal. Systolic function was moderately reduced.  ------------------------------------------------------------------- Pulmonic valve:  Structurally normal valve.  Cusp separation was normal. Doppler: Transvalvular velocity was within the normal range. There was no evidence for stenosis. There was mild regurgitation.  ------------------------------------------------------------------- Tricuspid valve:  Structurally normal valve.  Doppler: Transvalvular velocity was within the normal range. There was moderate regurgitation.  ------------------------------------------------------------------- Pulmonary artery:  The main pulmonary artery was normal-sized. Systolic pressure was mildly increased.  ------------------------------------------------------------------- Right atrium: The atrium was mildly dilated.  ------------------------------------------------------------------- Pericardium: There was no pericardial  effusion.  ------------------------------------------------------------------- Systemic veins: Inferior vena cava: The vessel was dilated. The respirophasic diameter changes were blunted (< 50%), consistent with elevated central venous pressure.  ------------------------------------------------------------------- Measurements  Left ventricle              Value     Reference LV ID, ED, PLAX chordal      (H)   54.4 mm    43 - 52 LV ID, ES, PLAX chordal      (H)   42.6 mm    23 - 38 LV fx shortening, PLAX chordal  (L)   22  %    >=29 LV PW thickness, ED            13.9 mm    --------- IVS/LV PW ratio, ED            0.65      <=1.3 Stroke volume, 2D             68  ml    --------- Stroke volume/bsa, 2D           28  ml/m^2  ---------  Ventricular septum            Value     Reference IVS thickness, ED             9.06 mm    ---------  LVOT                   Value     Reference LVOT ID, S                22  mm    --------- LVOT area                 3.8  cm^2   --------- LVOT peak velocity, S           99.3 cm/s   --------- LVOT mean velocity, S           58.7 cm/s   --------- LVOT VTI, S                17.8 cm    --------- LVOT peak gradient, S           4   mm Hg  ---------  Aortic valve               Value     Reference Aortic valve peak velocity, S  220  cm/s   --------- Aortic valve mean velocity, S       152  cm/s   --------- Aortic valve VTI, S            47.1 cm    --------- Aortic mean gradient, S          11  mm Hg  --------- Aortic peak gradient, S          19  mm Hg  --------- VTI ratio, LVOT/AV             0.38      --------- Aortic valve area, VTI          1.44 cm^2   --------- Aortic valve area/bsa, VTI        0.59 cm^2/m^2 --------- Velocity ratio, peak, LVOT/AV       0.45      --------- Aortic valve area, peak velocity     1.72 cm^2   --------- Aortic valve area/bsa, peak        0.71 cm^2/m^2 --------- velocity Velocity ratio, mean, LVOT/AV       0.39      --------- Aortic valve area, mean velocity     1.47 cm^2   --------- Aortic valve area/bsa, mean        0.61 cm^2/m^2 --------- velocity  Aorta                   Value     Reference Aortic root ID, ED            30  mm    ---------  Left atrium                Value     Reference LA ID, A-P, ES              38  mm    --------- LA ID/bsa, A-P              1.57 cm/m^2  <=2.2 LA volume, S               69.2 ml    --------- LA volume/bsa, S             28.6 ml/m^2  --------- LA volume, ES, 1-p A4C          69.4 ml    --------- LA volume/bsa, ES, 1-p A4C        28.6 ml/m^2  --------- LA volume, ES, 1-p A2C          64  ml    --------- LA volume/bsa, ES, 1-p A2C        26.4 ml/m^2  ---------  Mitral valve               Value     Reference Mitral E-wave peak velocity        152  cm/s   --------- Mitral deceleration time         162  ms    150 - 230 Mitral peak gradient, D          9   mm Hg  ---------  Pulmonary arteries            Value     Reference PA pressure, S, DP        (H)   44  mm Hg  <=30  Tricuspid valve              Value  Reference Tricuspid regurg peak velocity      269  cm/s   --------- Tricuspid peak  RV-RA gradient       29  mm Hg  ---------  Systemic veins              Value     Reference Estimated CVP               15  mm Hg  ---------  Right ventricle              Value     Reference RV pressure, S, DP        (H)   44  mm Hg  <=30 RV s&', lateral, S             5.98 cm/s   ---------  Legend: (L) and (H) mark values outside specified reference range.  ------------------------------------------------------------------- Prepared and Electronically Authenticated by  Ena Dawley, M.D. 2016-07-20T14:51:27       [place] 2nd Mrg to 2nd Mrg lesion, 100% stenosed.  LPDA-2 lesion, 90% stenosed.  LPDA-1 lesion, 70% stenosed.  Ost 4th Mrg lesion, 70% stenosed.  Prox RCA lesion, 75% stenosed.  Ost 2nd Diag to 2nd Diag lesion, 90% stenosed.  Dist LAD lesion, 90% stenosed.  Ost LAD lesion, 30% stenosed.  There is severe left ventricular systolic dysfunction.  Low gradient moderate aortic valve stenosis  Severe LV dysfunction with an ejection fraction of 10-15% with diffuse hypocontractility.  Low gradient at least moderate aortic valve stenosis with a calculated aortic valve area of 1.2 cm.  Significant elevation of right heart pressures and moderately severe to severe pulmonary hypertension with a PA systolic pressure of 60 mm Hg.  Severe coronary calcification with a short/common ostial left main, 30% proximal LAD stenosis, 90% proximal diagonal 2 stenosis and 90% mid LAD stenosis; occlusion of obtuse marginal branch of the circumflex with distal marginal stenosis of 70 and 90% and 70% distal AV groove stenosis in a dominant left circumflex system; and 75% proximal RCA stenosis and a nondominant RCA.  RECOMMENDATION:  Consider surgical evaluation in this patient with a severe cardiomyopathy/AS/CAD. Reportedly in 2013 he  had normal EF without aortic stenosis.     Coronary Findings    Dominance: Left   Left Anterior Descending   . Ost LAD lesion, 30% stenosed.   Jorene Minors LAD lesion, 90% stenosed.   . Second Diagonal Branch   . Ost 2nd Diag to 2nd Diag lesion, 90% stenosed.     Left Circumflex   . Second [employer]   . Ost 2nd Mrg to 2nd Mrg lesion, 100% stenosed.   . Fourth Obtuse Marginal Branch   . Ost 4th Mrg lesion, 70% stenosed.   . Left Posterior Descending Artery   . LPDA-1 lesion, 70% stenosed.   Marland Kitchen LPDA-2 lesion, 90% stenosed.     Right Coronary Artery   . Prox RCA lesion, 75% stenosed.       Right Heart Pressures Hemodynamic findings consistent with severe pulmonary hypertension and mitral valve regurgitation. Elevated LV EDP consistent with volume overload.    Wall Motion                 Left Heart    Left Ventricle The left ventricle is enlarged. There is severe left ventricular systolic dysfunction. There are wall motion abnormalities in the left ventricle. Severe global hypocontractility; EF 10 - 15%.    Coronary Diagrams  Diagnostic Diagram           STS Risk Calculator  Procedure    AVR + CABG  Risk of Mortality   31.5% Morbidity or Mortality  72.5% Prolonged LOS   50.0% Short LOS    2.9% Permanent Stroke   3.5% Prolonged Vent Support  65.7% DSW Infection    3.9% Renal Failure    n/a% Reoperation    20.7%      Impression:  I have personally reviewed the patient's transthoracic echocardiogram and diagnostic catheterization.  Echocardiogram confirms the presence of aortic stenosis and severe left ventricular systolic dysfunction.  All 3 leaflets of the aortic valve are thickened, partially calcified, and demonstrate restricted leaflet mobility.  The patient clearly has severely impaired left ventricular function with low cardiac output.  Peak velocity across the aortic valve measured 2.2 m/s corresponding to a mean transvalvular  gradient estimated at 12 mmHg.   Cardiac catheterization confirmed the presence of severe three-vessel coronary artery disease with severe ischemic cardiomyopathy, ejection fraction estimated 10-15%. All of the patient's coronary arteries are heavily calcified with multiple areas of high-grade flow-limiting stenosis. Distal target vessels appear poor and diffusely diseased.  Hemodynamic findings were also consistent with likely moderate aortic stenosis with relatively low transvalvular gradients because of low cardiac output. There was moderate pulmonary hypertension reported.  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 The patient presents with progressive decline in functional status and worsening shortness of breath consistent with chronic combined systolic and diastolic congestive heart failure, New York Heart Association functional class IV.  Up until today he has not had any previous episodes of chest discomfort, but I suspect his episode of chest pain prior to dialysis may have been unstable angina pectoris.   Unfortunately, this patient would not be considered a candidate for aortic valve replacement and coronary artery bypass grafting under any circumstances. 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    Recommendations:  Palliative PCI and stenting could be considered for management of the patient's coronary artery disease with angina pectoris. The patient probably does not have  severe aortic stenosis and should not be considered a candidate for TAVR even if his aortic stenosis was severe.  His long term prognosis is very poor.  I discussed matters at length with the patient and his family at the bedside.  Alternative treatment options and long term prognosis was discussed.  All of their questions have been addressed.    Please call if we can be of further assistance.   I spent in excess of 120 minutes during the conduct of this hospital consultation and >50% of this time involved direct face-to-face encounter for counseling and/or coordination of the patient's care.   Valentina Gu. Roxy Manns, MD 01/01/2015 6:25 PM

## 2015-01-01 NOTE — Progress Notes (Signed)
Hemodialysis- Pt tolerated treatment well, however signed off early (with 1 hour 15 minutes remaining) d/t cramping in sides and stomach. All blood given back, AMA form placed in chart. Pt states "Ill be coming back tomorrow anyway." Vitals remained stable. Total UF 2.3L out of 4L goal.

## 2015-01-01 NOTE — Progress Notes (Addendum)
Inpatient Diabetes Program Recommendations  AACE/ADA: New Consensus Statement on Inpatient Glycemic Control (2013)  Target Ranges:  Prepandial:   less than 140 mg/dL      Peak postprandial:   less than 180 mg/dL (1-2 hours)      Critically ill patients:  140 - 180 mg/dL     Diabetes history: DM2 Outpatient Diabetes medications: Novolog 30 units TID with meals Current orders for Inpatient glycemic control: Novolog 0-20 units TID with meals, Novolog 0-5 units HS, Novlog 2 units TID with meals for meal coverage.   Inpatient Diabetes Program Recommendations Insulin - Meal Coverage: Please consider increasing Novolog meal coverage to Novolog 8 units TID with meals if patient is eating at least 50% of meals.  Note: Spoke with patient about diabetes and home regimen for diabetes control. Patient reports that he is followed by Dr. Loanne Drilling for diabetes management and currently he takes Novolog 30 units TID with meals as an outpatient for diabetes control.   Inquired about knowledge about A1C and patient reports that he knows what an A1C is but does not recall his last A1C. Patient states that he has not seen Dr. Loanne Drilling "in quite a while". Noted in reviewing the chart that the last office note from Dr. Loanne Drilling was on 03/08/2014. Patient states that he checks his glucose and it generally runs in the 200's mg/dl and goes up to 300's mg/dl at times.  Discussed A1C results (9.1% on 12/26/2014) and explained how current A1C equates to an average glucose of about 215 mg/dl. Reviewed glucose and A1C target goals. Inquired about using basal insulin and patient reports that he has "been on insulin for at least 20 years and I have tried using Lantus in the past at bedtime but I stopped using it because it did not seem to work."  Stressed potential complications related to uncontrolled diabetes and encouraged patient to check his glucose at least 4 times per day and to keep a log of glucose readings which he should be  taking to his follow up appointments so the doctor can use the information to make DM medication adjustments. Encouraged patient to follow up soon with Dr. Loanne Drilling so DM medications can be adjusted to improve glycemic control.  Patient verbalized understanding of information discussed and he states that he has no further questions at this time related to diabetes.   Thanks, Barnie Alderman, RN, MSN, CCRN, CDE Diabetes Coordinator Inpatient Diabetes Program [phone] (Team Pager from [place] to [place]) [phone] ([place] office) [phone] [facility] office) [phone] [facility] office)

## 2015-01-01 NOTE — Progress Notes (Signed)
[facility] PROGRESS NOTE  [name] XBD:[id number] DOB: 1950-12-27 DOA: 12/16/2014 PCP: Renato Shin, MD  64 year old male with chronic A. 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 [facility] where he underwent echo showing an EF of 15% (echo with EF of about 50%). He was also found to have a critical care and a Myoview done showing reversible ischemia. During the hospitalization he also had mild hematemesis and underwent EGD which showed a gastric polyp. It was planned for a cardiac cath but since his cardiologist is in [place] (Dr. Rayann Heman) he was transferred here and admitted by cardiology. Hospitalists consulted for management of uncontrolled diabetes mellitus.  Assessment/Plan: Uncontrolled type 2 diabetes mellitus Poorly controlled as outpatient with A1c of 9.1. Patient reports taking  NPH 30 u with lunch and supper. In forms that he did not S1 to Lantus in the past. FSG still in 200s. I offered him to use glargine at bedtime and he agrees for it. Place him on glargine 12 units at bedtime, continue sliding scale and continue pre-meal aspart 2 units 3 times a day. It appears that he has not seen his PCP in over a year and should all up within 2 weeks of discharge. (His PCP is endocrinologist who can manage his diabetes closely outpatient).  Acute systolic CHF, mild NSTEMI with reversible ischemia on Myoview and critical AS Clinically appears euvolemic. Reported chest pain symptoms this morning. A repeat 2-D echo done this morning showing  EF of 20-25% with diffuse hypokinesis. Aortic valve is severely thickened with mild stenosis but reported that it may have been underestimated by low-flow and possibly is severe. Cardiothoracic surgery consulted by primary team. Further management per primary.  Chronic A. fib Currently on IV heparin drip. Coumadin on hold. Management per cardiology.  Peripheral artery disease with right toe ulcer Status post left BKA. Has right toe ulcer. Sees Dr. Sharol Given as outpatient. Basilar surgery consulted recommended diagnostic arteriogram once cardiac issues stable but suggested patient may need a BKA as appears nonsalvageable ulcer.  End-stage renal disease On scheduled dialysis. Renal following  Code Status: Full code Family Communication: None at bedside Disposition Plan: Per primary      Antibiotics:  None  HPI/Subjective: Seen in dialysis. Complain of chest pain this morning which subsided after a few minutes.Marland Kitchen 2-D echo done   Objective: Filed Vitals:   01/01/15 1600  BP: 116/63  Pulse: 82  Temp:   Resp:     Intake/Output Summary (Last 24 hours) at 01/01/15 1620 Last data filed at 01/01/15 1400  Gross per 24 hour  Intake 1032.4 ml  Output   3500 ml  Net -2467.6 ml   Filed Weights   01/04/2015 1601 12/30/2014 2025 01/01/15 1410  Weight: 114.9 kg (253 lb 4.9 oz) 111.4 kg (245 lb 9.5 oz) 113 kg (249 lb 1.9 oz)    Exam:   General:  Middle aged male appears fatigued, in no acute distress  HEENT: Moist oral mucosa  Chest: Clear to auscultation bilaterally  CVS: S1 and S2 irregular, systolic murmur 2/6  GI: Soft, nondistended, nontender  Musculoskeletal: Left BKA, right toe ulcer, left upper extremity AV graft    Data Reviewed: Basic Metabolic Panel:  Recent Labs Lab 01/12/2015 0555 01/09/2015 0231 01/01/15 0213  NA 134* 133* 133*  K 5.7* 5.9* 5.2*  CL 95* 96* 94*  CO2 23 18* 23  GLUCOSE 212* 207* 252*  BUN 34* 40* 31*  CREATININE 6.86* 7.20* 6.34*  CALCIUM  9.2 9.3 8.4*  PHOS  --   --  6.0*   Liver Function Tests:  Recent Labs Lab 12/20/2014 0555 01/01/15 0213  AST 17  --   ALT 14*  --   ALKPHOS 88  --   BILITOT 1.1  --   PROT 6.8  --   ALBUMIN 3.2* 2.9*   No results for input(s): LIPASE, AMYLASE in the last 168 hours. No results for input(s): AMMONIA in the last 168 hours. CBC:  Recent Labs Lab 12/14/2014 0555 12/25/2014 0231 01/01/15 0213  WBC 9.1 11.0* 12.1*  NEUTROABS 6.4  --   --   HGB 12.5* 12.5* 11.9*  HCT 39.6 39.7 38.4*  MCV 96.6 96.8 96.7  PLT 302 318 279   Cardiac Enzymes:  Recent Labs Lab 12/24/2014 0555  TROPONINI 0.15*   BNP (last 3 results)  Recent Labs  06/20/14 1241  BNP 1189.4*    ProBNP (last 3 results) No results for input(s): PROBNP in the last 8760 hours.  CBG:  Recent Labs Lab 12/17/2014 0922 12/15/2014 1314 12/17/2014 2119 01/01/15 0743 01/01/15 1237  GLUCAP 171* 179* 156* 260* 214*    Recent Results (from the past 240 hour(s))  MRSA PCR Screening     Status: None   Collection Time: 12/22/2014  3:49 PM  Result Value Ref Range Status   MRSA by PCR NEGATIVE NEGATIVE Final    Comment:        The GeneXpert MRSA Assay (FDA approved for NASAL specimens only), is one component of a comprehensive MRSA colonization surveillance program. It is not intended to diagnose MRSA infection nor to guide or monitor treatment for MRSA infections.      Studies: No results found.  Scheduled Meds: . antiseptic oral rinse  7 mL Mouth Rinse BID  . aspirin  81 mg Oral Daily  . atorvastatin  80 mg Oral q1800  . calcium acetate  1,334 mg Oral TID WC  . cinacalcet  30 mg Oral Q breakfast  . gabapentin  300 mg Oral QHS  . insulin aspart  0-20 Units Subcutaneous TID WC  . insulin aspart  0-5 Units Subcutaneous QHS  . insulin aspart  2 Units Subcutaneous TID WC  . insulin glargine  12 Units Subcutaneous QHS  . metoprolol tartrate  12.5 mg Oral BID  . multivitamin  1 tablet Oral QHS  . pantoprazole   40 mg Oral BID  . sodium chloride  3 mL Intravenous Q12H  . sodium chloride  3 mL Intravenous Q12H  . sodium chloride  3 mL Intravenous Q12H  . tiotropium  18 mcg Inhalation Daily   Continuous Infusions: . heparin 2,400 Units/hr (12/18/2014 2114)      Time spent: 25 minutes    Tyniya Kuyper, Happy  [facility] Pager [phone] If 7PM-7AM, please contact night-coverage at [email or web address], password [facility] 01/01/2015, 4:20 PM  LOS: 2 days

## 2015-01-01 NOTE — Progress Notes (Signed)
[place] for heparin Indication: ACS/PAF history on warfarin pta  No Known Allergies  Patient Measurements: Height: 6' (182.9 cm) Weight: 249 lb 1.9 oz (113 kg) IBW/kg (Calculated) : 77.6 Heparin Dosing Weight: 102kg  Vital Signs: Temp: 97.1 F (36.2 C) (07/20 1410) Temp Source: Oral (07/20 1256) BP: 125/50 mmHg (07/20 1445) Pulse Rate: 76 (07/20 1445)  Labs:  Recent Labs  12/23/2014 0555  01/07/2015 0231 01/01/15 0213 01/01/15 0904  HGB 12.5*  --  12.5* 11.9*  --   HCT 39.6  --  39.7 38.4*  --   PLT 302  --  318 279  --   LABPROT 17.3*  --   --   --   --   INR 1.40  --   --   --   --   HEPARINUNFRC  --   < > 0.31 0.24* 0.37  CREATININE 6.86*  --  7.20* 6.34*  --   TROPONINI 0.15*  --   --   --   --   < > = values in this interval not displayed.  Estimated Creatinine Clearance: 15.3 mL/min (by C-G formula based on Cr of 6.34).  Assessment: 77 yom with ESRD - HD TTS, PAF on warfarin pta. ECHO at [facility] showed severe aortic stenosis with AVA 0.99, RV dysfunction, and EF 15%. Myoview - CFX ischemia. Transferred to [facility] for cath but felt he should be cleared from GI prior to cath due to hematemesis on admit. Cleared for cath but transferred to [facility] for procedure.   Pharmacy consulted to dose heparin for ACS/PAF  PMH: GERD, HTN, neuropathy, PAF, HLD, gout, CAD, CHF, COPD, [place] 1 AVB, PVD, DM2, ESRD - HD TTS, depression, basal cell carcinoma  AC: heparin for ACS/PAF history (on coumadin pta). Coumadin held & started on heparin drip prior to arrival. [facility] 7/19, no interventions made. INR 1.4. H/H stable, plt wnl - received Vit K 5mg  po at OSH 7/15   PTA warfarin dose: 5mg , except 2.5mg  on TThSat (last dose 7/13)  Therapeutic on heparin 2400 units/hr pre-cath. [place]ath out 7/19 ~1300, restarted hep at 2100.  HL 0.37 therapeutic (goal 0.3-0.7) on heparin 2400 units/hr  Goal of Therapy:  Heparin level  0.3-0.7 units/ml Monitor platelets by anticoagulation protocol: Yes   Plan:  -Continue heparin 2400 units/hr -Daily HL and CBC  Heloise Ochoa, PharmD  01/01/2015 2:49 PM  Discussed with resident and agree with above  Bonnita Nasuti Pharm.D. CPP, BCPS Clinical Pharmacist [phone] 01/03/2015 7:32 AM

## 2015-01-02 ENCOUNTER — Inpatient Hospital Stay (HOSPITAL_COMMUNITY): Payer: Medicare Other

## 2015-01-02 ENCOUNTER — Other Ambulatory Visit (HOSPITAL_COMMUNITY): Payer: Self-pay | Admitting: Respiratory Therapy

## 2015-01-02 DIAGNOSIS — Z720 Tobacco use: Secondary | ICD-10-CM

## 2015-01-02 DIAGNOSIS — Z7189 Other specified counseling: Secondary | ICD-10-CM

## 2015-01-02 DIAGNOSIS — Z515 Encounter for palliative care: Secondary | ICD-10-CM

## 2015-01-02 DIAGNOSIS — A0472 Enterocolitis due to Clostridium difficile, not specified as recurrent: Secondary | ICD-10-CM | POA: Insufficient documentation

## 2015-01-02 DIAGNOSIS — A047 Enterocolitis due to Clostridium difficile: Secondary | ICD-10-CM

## 2015-01-02 DIAGNOSIS — E118 Type 2 diabetes mellitus with unspecified complications: Secondary | ICD-10-CM

## 2015-01-02 LAB — HEPARIN LEVEL (UNFRACTIONATED)
HEPARIN UNFRACTIONATED: 0.17 [IU]/mL — AB (ref 0.30–0.70)
Heparin Unfractionated: 0.32 IU/mL (ref 0.30–0.70)

## 2015-01-02 LAB — GLUCOSE, CAPILLARY
GLUCOSE-CAPILLARY: 104 mg/dL — AB (ref 65–99)
Glucose-Capillary: 137 mg/dL — ABNORMAL HIGH (ref 65–99)
Glucose-Capillary: 126 mg/dL — ABNORMAL HIGH (ref 65–99)
Glucose-Capillary: 118 mg/dL — ABNORMAL HIGH (ref 65–99)

## 2015-01-02 LAB — RENAL FUNCTION PANEL
Albumin: 2.9 g/dL — ABNORMAL LOW (ref 3.5–5.0)
Anion gap: 16 — ABNORMAL HIGH (ref 5–15)
BUN: 28 mg/dL — AB (ref 6–20)
CO2: 23 mmol/L (ref 22–32)
Calcium: 9.4 mg/dL (ref 8.9–10.3)
Chloride: 100 mmol/L — ABNORMAL LOW (ref 101–111)
Creatinine, Ser: 5.84 mg/dL — ABNORMAL HIGH (ref 0.61–1.24)
GFR calc Af Amer: 11 mL/min — ABNORMAL LOW (ref >60)
GFR, EST NON AFRICAN AMERICAN: 9 mL/min — AB (ref >60)
GLUCOSE: 135 mg/dL — AB (ref 65–99)
POTASSIUM: 4.6 mmol/L (ref 3.5–5.1)
Phosphorus: 4.5 mg/dL (ref 2.5–4.6)
Sodium: 139 mmol/L (ref 135–145)

## 2015-01-02 LAB — CLOSTRIDIUM DIFFICILE BY PCR: Toxigenic C. Difficile by PCR: POSITIVE — AB

## 2015-01-02 LAB — CBC
HEMATOCRIT: 37.9 % — AB (ref 39.0–52.0)
HEMOGLOBIN: 11.7 g/dL — AB (ref 13.0–17.0)
MCH: 29.8 pg (ref 26.0–34.0)
MCHC: 30.9 g/dL (ref 30.0–36.0)
MCV: 96.4 fL (ref 78.0–100.0)
Platelets: 282 10*3/uL (ref 150–400)
RBC: 3.93 MIL/uL — ABNORMAL LOW (ref 4.22–5.81)
RDW: 17.3 % — ABNORMAL HIGH (ref 11.5–15.5)
WBC: 17.2 10*3/uL — AB (ref 4.0–10.5)

## 2015-01-02 MED ORDER — VANCOMYCIN 50 MG/ML ORAL SOLUTION
125.0000 mg | Freq: Four times a day (QID) | ORAL | Status: DC
  Filled 2015-01-02 (×3): qty 2.5

## 2015-01-02 MED ORDER — HYDROCODONE-ACETAMINOPHEN 5-325 MG PO TABS
2.0000 | ORAL_TABLET | Freq: Four times a day (QID) | ORAL | Status: DC | PRN
  Administered 2015-01-02 – 2015-01-03 (×4): 2 via ORAL
  Filled 2015-01-02 (×4): qty 2

## 2015-01-02 MED ORDER — VANCOMYCIN 50 MG/ML ORAL SOLUTION
500.0000 mg | Freq: Four times a day (QID) | ORAL | Status: DC
  Administered 2015-01-02 – 2015-01-05 (×10): 500 mg via ORAL
  Filled 2015-01-02 (×15): qty 10

## 2015-01-02 NOTE — Progress Notes (Signed)
Admit: 12/28/2014 LOS: 3  65M ESRD on iHD with NSTEMI, MV CAD, severe AS, new acute systolic HF  Subjective:  HD yesterday, 2.3L UF, cramped, post bed weight of 110.5kg NOtes reviewed, no operative possibilities Perhaps can have PCI, likely to have LE amputation. Discuss big picture, pt tired and contemplating stop HD  07/20 0701 - 07/21 0700 In: 1095.8 [P.O.:540; I.V.:555.8] Out: 2394   [facility] Weights   01/06/2015 2025 01/01/15 1410 01/01/15 1701  Weight: 111.4 kg (245 lb 9.5 oz) 113 kg (249 lb 1.9 oz) 110.5 kg (243 lb 9.7 oz)    Scheduled Meds: . antiseptic oral rinse  7 mL Mouth Rinse BID  . aspirin  81 mg Oral Daily  . atorvastatin  80 mg Oral q1800  . calcium acetate  1,334 mg Oral TID WC  . cinacalcet  30 mg Oral Q breakfast  . gabapentin  300 mg Oral QHS  . insulin aspart  0-20 Units Subcutaneous TID WC  . insulin aspart  0-5 Units Subcutaneous QHS  . insulin aspart  2 Units Subcutaneous TID WC  . insulin glargine  12 Units Subcutaneous QHS  . metoprolol tartrate  12.5 mg Oral BID  . multivitamin  1 tablet Oral QHS  . pantoprazole  40 mg Oral BID  . sodium chloride  3 mL Intravenous Q12H  . sodium chloride  3 mL Intravenous Q12H  . sodium chloride  3 mL Intravenous Q12H  . tiotropium  18 mcg Inhalation Daily   Continuous Infusions: . heparin 2,600 Units/hr (01/02/15 0409)   PRN Meds:.sodium chloride, sodium chloride, sodium chloride, acetaminophen, albuterol, ALPRAZolam, HYDROcodone-acetaminophen, loperamide, nitroGLYCERIN, ondansetron (ZOFRAN) IV, polyethylene glycol, sodium chloride, sodium chloride, sodium chloride, temazepam, zolpidem  Current Labs: reviewed    Physical Exam:  Blood pressure 82/31, pulse 107, temperature 97.6 F (36.4 C), temperature source Oral, resp. rate 16, height 6' (1.829 m), weight 110.5 kg (243 lb 9.7 oz), SpO2 97 %. GEN: NAD ENT: NCAT EYES: EOMI CV: RRR, 3/6 MSM, no rub PULM: CTAB ant ABD: s/nt/nd SKIN: no rashes/lesios;  RLE EXT:no LEE  Outpt HD Orders Unit: [facility] Days: THS Time: 4h15min Dialyzer: F180 EDW: 101.5kg K/Ca: 2/2 Access: LUE AVF Needle Size: 15g BFR/DFR: 500/a1.5 UF Proflie: 2 VDRA: Hectorol 9 qTx EPO: No ESA IV Fe: No standing Fe Heparin: No heparin during Tx Most Recent Phos / PTH: 10.4 / 528 Most Recent TSAT: 41 Most Recent eKT/V: 1.37 Treatment Adherence: fair  A/P 1. ESRD:  1. On THS Schedule, hold today 2. Goals of Care 1. With end stage heart and kidney disease, likley amputatino 2. Pt wishes to meet with palliative care, have consulted 3. HTN/Vol:  1. Lowering as inpt, last weight 110.5kg bed weight 2. On MTP 4. Anemia: Hb stable no ESA as outpt 5. MBD:  1. Chronic hyperphos: cont binders 2. On ciancalcet 3. Cont VDRA with HD 6. Reduced LVEF, NSTEMI, severe AS, MV CAD 1. Seen by CT surgery 2. No operative plan, perhaps PCI? 7. Permanent AFib on Coumadin as outpt, hep gtt here  Pearson Grippe MD 01/02/2015, 9:39 AM   Recent Labs Lab 12/17/2014 0231 01/01/15 0213 01/02/15 0242  NA 133* 133* 139  K 5.9* 5.2* 4.6  CL 96* 94* 100*  CO2 18* 23 23  GLUCOSE 207* 252* 135*  BUN 40* 31* 28*  CREATININE 7.20* 6.34* 5.84*  CALCIUM 9.3 8.4* 9.4  PHOS  --  6.0* 4.5    Recent Labs Lab 01/07/2015 0555 12/21/2014 0231 01/01/15  1753 01/02/15 0242  WBC 9.1 11.0* 12.1* 17.2*  NEUTROABS 6.4  --   --   --   HGB 12.5* 12.5* 11.9* 11.7*  HCT 39.6 39.7 38.4* 37.9*  MCV 96.6 96.8 96.7 96.4  PLT 302 318 279 282

## 2015-01-02 NOTE — Progress Notes (Signed)
Thank you for consulting the Palliative Medicine Team at [facility] to meet your patient's and family's needs.   The reason that you asked [place] to see your patient is  For Establishing GOC  We have scheduled your patient for a meeting: 01-03-15 at 1000  Other family members that need to be present: Wife and children  Wadie Lessen NP  Palliative Medicine Team Team Phone # [phone] Pager [phone]

## 2015-01-02 NOTE — Progress Notes (Signed)
[facility] PROGRESS NOTE  ELZA SORTOR MEQ:[id number] DOB: 1950-12-17 DOA: 12/14/2014 PCP: Christinia Gully, MD  64 year old male with chronic A. 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 He was then transferred to newborn hospital where he underwent echo showing an EF of 15% (echo with EF of about 50%). He was also found to have a critical care and a Myoview done showing reversible ischemia. During the hospitalization he also had mild hematemesis and underwent EGD which showed a gastric polyp. It was planned for a cardiac cath but since his cardiologist is in [place] (Dr. Rayann Heman) he was transferred here and admitted by cardiology. Hospitalists consulted for management of uncontrolled diabetes mellitus.  Assessment/Plan: Uncontrolled type 2 diabetes mellitus Poorly controlled as outpatient with A1c of 9.1. Patient reports taking  NPH 30 u with lunch and supper. Reports not responding to lantus in past. Added glargine in addition to meal coverage with good fsg control.   Acute systolic CHF, mild NSTEMI with reversible ischemia on Myoview and critical AS Seen by CTVS. . Not a candidate for valve replacement and CABG due to completely calcified porcelain aorta with severe biventricular dysfunction decompensated CHF. Patient tired of his underlying condition and HD. Requests palliative consult. Marland Kitchen Has [place] meeting in am.   Sepsis with cdiff colitis  abdomien is distended and tender. Offered CT abdoemn and pelvis but patient refused. Will increase oral vancomycin dose to 500mg   qid.  Chronic A. fib Currently on IV heparin drip. Coumadin on hold. Management per cardiology.  Peripheral artery disease with right toe ulcer Status post left BKA. Has right toe ulcer. Sees Dr. Sharol Given as outpatient. Basilar surgery consulted recommended diagnostic arteriogram once cardiac issues stable but suggested patient may need a BKA as appears nonsalvageable ulcer.  End-stage renal disease On scheduled dialysis. Renal following  Code Status: Full code Family Communication: None at bedside Disposition Plan: Per primary. Overall prognosis is poor      Antibiotics:  None  HPI/Subjective: Pt developed cdiff diarrhea overnight. Seen by CTVS and deemed not a candidate for surgery. Pt requested palliative care consult . Hypotensive this am and has increased abd pain.  Objective: Filed Vitals:   01/02/15 1124  BP: 95/28  Pulse:   Temp: 97.8 F (36.6 C)  Resp: 24    Intake/Output Summary (Last 24 hours) at 01/02/15 1558 Last data filed at 01/02/15 1500  Gross per 24 hour  Intake 1147.83 ml  Output   2394 ml  Net -1246.17 ml   Filed Weights   12/25/2014 2025 01/01/15 1410 01/01/15 1701  Weight: 111.4 kg (245 lb 9.5 oz) 113 kg (249 lb 1.9 oz) 110.5 kg (243 lb 9.7 oz)    Exam:   General:  Middle aged male appears fatigued, in some distress with abdominal pain  HEENT: Moist oral mucosa  Chest: Clear to auscultation bilaterally  CVS: S1 and S2 irregular, systolic murmur 2/6  GI: mild distention and tender to palpation. BS +  Musculoskeletal: Left BKA, right toe ulcer, left upper extremity AV graft    Data Reviewed: Basic Metabolic Panel:  Recent Labs Lab 12/29/2014 0555 12/22/2014  0231 01/01/15 0213 01/02/15 0242  NA 134* 133* 133* 139  K 5.7* 5.9* 5.2* 4.6  CL 95* 96* 94* 100*  CO2 23 18* 23 23  GLUCOSE 212* 207* 252* 135*  BUN 34* 40* 31* 28*  CREATININE 6.86* 7.20* 6.34* 5.84*  CALCIUM 9.2 9.3 8.4* 9.4  PHOS  --   --  6.0* 4.5   Liver Function  Tests:  Recent Labs Lab 01/11/2015 0555 01/01/15 0213 01/02/15 0242  AST 17  --   --   ALT 14*  --   --   ALKPHOS 88  --   --   BILITOT 1.1  --   --   PROT 6.8  --   --   ALBUMIN 3.2* 2.9* 2.9*   No results for input(s): LIPASE, AMYLASE in the last 168 hours. No results for input(s): AMMONIA in the last 168 hours. CBC:  Recent Labs Lab 01/08/2015 0555 01/11/2015 0231 01/01/15 0213 01/02/15 0242  WBC 9.1 11.0* 12.1* 17.2*  NEUTROABS 6.4  --   --   --   HGB 12.5* 12.5* 11.9* 11.7*  HCT 39.6 39.7 38.4* 37.9*  MCV 96.6 96.8 96.7 96.4  PLT 302 318 279 282   Cardiac Enzymes:  Recent Labs Lab 01/09/2015 0555  TROPONINI 0.15*   BNP (last 3 results)  Recent Labs  06/20/14 1241  BNP 1189.4*    ProBNP (last 3 results) No results for input(s): PROBNP in the last 8760 hours.  CBG:  Recent Labs Lab 01/01/15 1237 01/01/15 1746 01/01/15 2130 01/02/15 0722 01/02/15 1114  GLUCAP 214* 105* 144* 137* 126*    Recent Results (from the past 240 hour(s))  MRSA PCR Screening     Status: None   Collection Time: 12/31/2014  3:49 PM  Result Value Ref Range Status   MRSA by PCR NEGATIVE NEGATIVE Final    Comment:        The GeneXpert MRSA Assay (FDA approved for NASAL specimens only), is one component of a comprehensive MRSA colonization surveillance program. It is not intended to diagnose MRSA infection nor to guide or monitor treatment for MRSA infections.   Clostridium Difficile by PCR (not at [facility])     Status: Abnormal   Collection Time: 01/02/15  2:14 AM  Result Value Ref Range Status   C difficile by pcr POSITIVE (A) NEGATIVE Final    Comment: CRITICAL RESULT CALLED TO, READ BACK BY AND VERIFIED WITH: C. ARMITAGE RN 9:45 01/02/15 ([email or web address])      Studies: Dg Abd Portable 1v  01/02/2015   CLINICAL DATA:  Generalized abdominal pain  EXAM: PORTABLE ABDOMEN - 1 VIEW  COMPARISON:  01/07/2011  FINDINGS: Scattered large and small bowel gas is noted. Diffuse vascular  calcifications are noted. No abnormal mass is identified. No definitive free air is seen. Degenerative changes of the lumbar spine are noted.  IMPRESSION: Nonspecific abdomen.  Aortic atherosclerotic disease.   Electronically Signed   By: Inez Catalina M.D.   On: 01/02/2015 09:53    Scheduled Meds: . antiseptic oral rinse  7 mL Mouth Rinse BID  . aspirin  81 mg Oral Daily  . atorvastatin  80 mg Oral q1800  . calcium acetate  1,334 mg Oral TID WC  . cinacalcet  30 mg Oral Q breakfast  . gabapentin  300 mg Oral QHS  . insulin aspart  0-20 Units Subcutaneous TID WC  . insulin aspart  0-5 Units Subcutaneous QHS  . insulin aspart  2 Units Subcutaneous TID  WC  . insulin glargine  12 Units Subcutaneous QHS  . metoprolol tartrate  12.5 mg Oral BID  . multivitamin  1 tablet Oral QHS  . sodium chloride  3 mL Intravenous Q12H  . sodium chloride  3 mL Intravenous Q12H  . sodium chloride  3 mL Intravenous Q12H  . tiotropium  18 mcg Inhalation Daily  . vancomycin  500 mg Oral QID   Continuous Infusions: . heparin 2,600 Units/hr (01/02/15 0800)      Time spent: 25 minutes    Yuette Putnam, [facility] Pager [phone] If 7PM-7AM, please contact night-coverage at [email or web address], password [facility] 01/02/2015, 3:58 PM  LOS: 3 days

## 2015-01-02 NOTE — Progress Notes (Signed)
ANTICOAGULATION CONSULT NOTE - Follow Up Consult  Pharmacy Consult for heparin Indication: chest pain/ACS and atrial fibrillation   Labs:  Recent Labs  01/03/2015 0555  01/01/2015 0231 01/01/15 0213 01/01/15 0904 01/02/15 0242  HGB 12.5*  --  12.5* 11.9*  --  11.7*  HCT 39.6  --  39.7 38.4*  --  37.9*  PLT 302  --  318 279  --  282  LABPROT 17.3*  --   --   --   --   --   INR 1.40  --   --   --   --   --   HEPARINUNFRC  --   < > 0.31 0.24* 0.37 0.17*  CREATININE 6.86*  --  7.20* 6.34*  --   --   TROPONINI 0.15*  --   --   --   --   --   < > = values in this interval not displayed.    Assessment: 64yo male now subtherapeutic on heparin after one level at low end of goal; RN reports no gtt issues though she does note blood in stools; stools have been frequent and loose, so unclear whether bleeding is a result of heparin or irritation, Hgb trending down but only by <1 point over three days.  Goal of Therapy:  Heparin level 0.3-0.7 units/ml   Plan:  Will increase heparin gtt conservatively to 2600 units/hr and check level in [place], PharmD, BCPS  01/02/2015,4:09 AM

## 2015-01-02 NOTE — Progress Notes (Signed)
[place] for heparin Indication: ACS/PAF history on warfarin pta   Labs:  Recent Labs  12/17/2014 0231 01/01/15 0213 01/01/15 0904 01/02/15 0242 01/02/15 1250  HGB 12.5* 11.9*  --  11.7*  --   HCT 39.7 38.4*  --  37.9*  --   PLT 318 279  --  282  --   HEPARINUNFRC 0.31 0.24* 0.37 0.17* 0.32  CREATININE 7.20* 6.34*  --  5.84*  --     Estimated Creatinine Clearance: 16.4 mL/min (by C-G formula based on Cr of 5.84).  Assessment: 4 yom with ESRD - HD TTS, PAF on warfarin pta. ECHO at [facility] showed severe aortic stenosis with AVA 0.99, RV dysfunction, and EF 15%. Myoview - CFX ischemia. Transferred to [facility] for cath but felt he should be cleared from GI prior to cath due to hematemesis on admit. Cleared for cath but transferred to [facility] for procedure.   Pharmacy consulted to dose heparin for ACS/PAF  PMH: GERD, HTN, neuropathy, PAF, HLD, gout, CAD, CHF, COPD, Mobitz 1 AVB, PVD, DM2, ESRD - HD TTS, depression, basal cell carcinoma  AC: heparin for ACS/PAF history (on coumadin pta). Coumadin held & started on heparin drip prior to arrival. [facility] 7/19, no interventions made. INR 1.4. H/H stable, plt wnl - received Vit K 5mg  po at OSH 7/15   PTA warfarin dose: 5mg , except 2.5mg  on TThSat (last dose 7/13)  Therapeutic on heparin 2600 units/hr this am (0.32). CVTS did not feel patient appropriate for surgery and cardiology does not feel appropriate for stenting. Limited options exist. To continue heparin for now, palliative care has been consulted.  Goal of Therapy:  Heparin level 0.3-0.7 units/ml Monitor platelets by anticoagulation protocol: Yes   Plan:  -Continue heparin 2600 units/hr -Daily HL and CBC  Erin Hearing PharmD., BCPS Clinical Pharmacist Pager [phone] 01/02/2015 1:57 PM

## 2015-01-02 NOTE — Consult Note (Signed)
[facility]  Subjective  - feels ok   Objective 82/31 107 97.6 F (36.4 C) (Oral) 16 97%  Intake/Output Summary (Last 24 hours) at 01/02/15 4497 Last data filed at 01/02/15 0600  Gross per 24 hour  Intake 1071.83 ml  Output   2394 ml  Net -1322.17 ml   Right foot still dusky no obvious infection no real change  Assessment/Planning: Chronic ischemia right foot.  Awaiting decision on PCI by cardiology.  If he returns to cath lab could have lower extremity diagnostic agram at same time.  Not a candidate for open operation possible PTA/stent of leg but forefoot not salvageable.  Ruta Hinds 01/02/2015 8:33 AM --  Laboratory Lab Results:  Recent Labs  01/01/15 0213 01/02/15 0242  WBC 12.1* 17.2*  HGB 11.9* 11.7*  HCT 38.4* 37.9*  PLT 279 282   BMET  Recent Labs  01/01/15 0213 01/02/15 0242  NA 133* 139  K 5.2* 4.6  CL 94* 100*  CO2 23 23  GLUCOSE 252* 135*  BUN 31* 28*  CREATININE 6.34* 5.84*  CALCIUM 8.4* 9.4    COAG Lab Results  Component Value Date   INR 1.40 01/11/2015   INR 4.2* 12/10/2014   INR 2.4* 11/26/2014   No results found for: PTT

## 2015-01-02 NOTE — Progress Notes (Signed)
Subjective: Episode of chest pain yesterday- relief with NTG + diarrhea and abd pain, c diff culture P--WBC elevated.   Objective: Vital signs in last 24 hours: Temp:  [97.1 F (36.2 C)-97.9 F (36.6 C)] 97.6 F (36.4 C) (07/21 0724) Pulse Rate:  [42-107] 107 (07/20 2300) Resp:  [16-23] 16 (07/21 0724) BP: (82-139)/(31-93) 82/31 mmHg (07/21 0724) SpO2:  [95 %-100 %] 97 % (07/21 0849) Weight:  [243 lb 9.7 oz (110.5 kg)-249 lb 1.9 oz (113 kg)] 243 lb 9.7 oz (110.5 kg) (07/20 1701) Weight change: -4 lb 3 oz (-1.9 kg) Last BM Date: 01/01/15 Intake/Output from previous day: -1274 had dialysis yesterday  Wt 243.9 down from 255 07/20 0701 - 07/21 0700 In: 1095.8 [P.O.:540; I.V.:555.8] Out: 2394  Intake/Output this shift:    PE: General:Pleasant affect, + abd pain significant Skin:Warm and dry, brisk capillary refill HEENT:normocephalic, sclera clear, mucus membranes moist Heart:Irreg  with murmur, no gallup, rub or click Lungs:diminished in bases,  without rales, rhonchi, or wheezes Abd: mildly distended, ++ tenderness diffusely , hypoactive  BS, do not palpate liver spleen or masses Ext:no lower ext edema on Rt sock on foot ,Lt BKA Neuro:alert and oriented X 3, MAE, follows commands, + facial symmetry Tele: a fib, rates 88-111 + PVCs  Lab Results:  Recent Labs  01/01/15 0213 01/02/15 0242  WBC 12.1* 17.2*  HGB 11.9* 11.7*  HCT 38.4* 37.9*  PLT 279 282   BMET  Recent Labs  01/01/15 0213 01/02/15 0242  NA 133* 139  K 5.2* 4.6  CL 94* 100*  CO2 23 23  GLUCOSE 252* 135*  BUN 31* 28*  CREATININE 6.34* 5.84*  CALCIUM 8.4* 9.4   No results for input(s): TROPONINI in the last 72 hours.  Invalid input(s): CK, MB  Lab Results  Component Value Date   CHOL 143 03/08/2014   HDL 38.40* 03/08/2014   LDLCALC 82 03/08/2014   LDLDIRECT 116.3 12/22/2011   TRIG 115.0 03/08/2014   CHOLHDL 4 03/08/2014   Lab Results  Component Value Date   HGBA1C 9.1*  01/09/2015     Lab Results  Component Value Date   TSH 1.129 12/18/2014    Hepatic Function Panel  Recent Labs  01/02/15 0242  ALBUMIN 2.9*   No results for input(s): CHOL in the last 72 hours. No results for input(s): PROTIME in the last 72 hours.     Studies/Results: ECHO: Study Conclusions  - Left ventricle: The cavity size was mildly dilated. There was mild concentric hypertrophy. Systolic function was severely reduced. The estimated ejection fraction was in the range of 20% to 25%. Diffuse hypokinesis. Doppler parameters are consistent with elevated ventricular end-diastolic filling pressure. - Aortic valve: Trileaflet; severely thickened, severely calcified leaflets. Valve mobility was restricted. There was mild stenosis. Mean gradient (S): 12 mm Hg. Peak gradient (S): 19 mm Hg. Valve area (VTI): 1.44 cm^2. Valve area (Vmax): 1.72 cm^2. Valve area (Vmean): 1.47 cm^2. - Aortic root: The aortic root was normal in size. - Mitral valve: Structurally normal valve. There was mild regurgitation. - Left atrium: The atrium was mildly dilated. - Right ventricle: The cavity size was normal. Wall thickness was normal. Systolic function was moderately reduced. - Right atrium: The atrium was mildly dilated. - Tricuspid valve: There was moderate regurgitation. - Pulmonary arteries: Systolic pressure was mildly increased. PA peak pressure: 44 mm Hg (S). - Inferior vena cava: The vessel was dilated. The respirophasic diameter changes were  blunted (< 50%), consistent with elevated central venous pressure. - Impressions: Severely decreased LVEF with LVEF 20-25%. Diffuse hypokinesis. Moderately decreased RVEF. Mild aortic stenosis by gradients, however most probably underestimated by low flow and possibly severe. Mild pulmonary hypertension.  This is a significant change when compared to the prior study from 2013 - with normal LV and  RVEF and no aortic stenosis.  Impressions:  - Severely decreased LVEF with LVEF 20-25%. Diffuse hypokinesis. Moderately decreased RVEF. Mild aortic stenosis by gradients, however most probably underestimated by low flow and possibly severe. Mild pulmonary hypertension.  This is a significant change when compared to the prior study from 2013 - with normal LV and RVEF and no aortic stenosis.   Medications: I have reviewed the patient's current medications. Scheduled Meds: . antiseptic oral rinse  7 mL Mouth Rinse BID  . aspirin  81 mg Oral Daily  . atorvastatin  80 mg Oral q1800  . calcium acetate  1,334 mg Oral TID WC  . cinacalcet  30 mg Oral Q breakfast  . gabapentin  300 mg Oral QHS  . insulin aspart  0-20 Units Subcutaneous TID WC  . insulin aspart  0-5 Units Subcutaneous QHS  . insulin aspart  2 Units Subcutaneous TID WC  . insulin glargine  12 Units Subcutaneous QHS  . metoprolol tartrate  12.5 mg Oral BID  . multivitamin  1 tablet Oral QHS  . pantoprazole  40 mg Oral BID  . sodium chloride  3 mL Intravenous Q12H  . sodium chloride  3 mL Intravenous Q12H  . sodium chloride  3 mL Intravenous Q12H  . tiotropium  18 mcg Inhalation Daily   Continuous Infusions: . heparin 2,600 Units/hr (01/02/15 0409)   PRN Meds:.sodium chloride, sodium chloride, sodium chloride, acetaminophen, albuterol, ALPRAZolam, HYDROcodone-acetaminophen, loperamide, nitroGLYCERIN, ondansetron (ZOFRAN) IV, polyethylene glycol, sodium chloride, sodium chloride, sodium chloride, temazepam, zolpidem  Assessment/Plan: Principal Problem:   Cardiomyopathy, ischemic Active Problems:   Type 2 diabetes with complication   Hyperlipidemia with target LDL less than 70   Essential hypertension   Coronary atherosclerosis of native coronary artery   SVT/ PSVT/ PAT   Ulcer of Rt foot- Dr Sharol Given follows   Permanent atrial fibrillation   Second degree Mobitz I AV block   Diabetes mellitus type 2,  insulin dependent   ESRD on dialysis   Smoking   Warfarin anticoagulation   PVD- Lt BKA 2014   Abnormal nuclear cardiac imaging test   Critical lower limb ischemia   Elevated troponin   Aortic stenosis   S/P below knee amputation  Very complex scenario - no prior evidence of CAD- now with significant diseas, Cardiomyopathy and valvular disease & now Dx with severe AS & Severe CM (presumably combination of Ischemic & valvular). Has recurrent Afib after ablation - was on warfarin now on heparin. S/p L BKA with evidence of RLE critical limb ischemia. Dr. Roxy Manns has seen and recommends palliative PCI with stenting -pt's coronary arteries are heavily calcified, distal target vessels appear poor and diffusely diseased.   "Unfortunately, this patient would not be considered a candidate for aortic valve replacement and coronary artery bypass grafting under any circumstances. 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 Ischemic foot.   His presentation was near syncope & also notes profound DOE including orthopnea & PND. Denies any CP or RLE pain (state RLE has been cool &unable to feel x ~3 months & ulcer is not acute).   Afib - rate controlled,on IV heparin  BP controlled to borderline at 82/31  on BB (would convert to bisoprolol or Toprol prior to d/c) t  Hyperlipidemia on statin   CLI - Dr. Sharol Given recommends Vasc Sgx involvment - Dr. Oneida Alar has seen   Per Dr. Oneida Alar "Awaiting decision on PCI by cardiology. If he returns to cath lab could have lower extremity diagnostic agram at same time. Not a candidate for open operation possible PTA/stent of leg but forefoot not salvageable."  DM - on SSI with meal coverage, no long-acting (managed per [facility]) with stable glucose. 105 to 260  COPD - on Spiriva   ICM- - I & 0 Negative 1274   Palliative care to come by.  Pt tired of dialysis.     LOS: 3 days   Time spent with pt. :20 minutes. [facility] R  Nurse Practitioner Certified Pager [phone] or after 5pm and on weekends call [phone] 01/02/2015, 9:01 AM  I have seen, examined and evaluated the patient this AM along with Cecilie Kicks, NP.  After reviewing all the available data and chart,  I agree with her findings, examination as well as impression recommendations.   I have reviewed his angiography - essentially porcelain coronaries as well as aorta --> PCI with low EF (& AS) would be quite high risk (do not think Impella/IABP pump support would be an option given the severity of PAD & aortic disease.  Would nee Rotational Atherectomy & probably LAD + DCx/LPL PCI, that may or may not help his cardiomyopathy.  On top of his CM  & AS of now questionable significance (AS initially read as Severe @ OSH -> here, read as Mild onEcho & mod on Cath -- probably in part related to Low Output), he also has essentially critical limb ischemia of his existing leg.  Added to this, he now has C0Diff colitis with rising WBC.    Hypotensive with even low dose BB - cannot titrate CHF meds.  Prognosis is extremely poor with limited options - agree with Palliative Care Team involvement.  He is also getting "tired of HD"   Zakari Couchman, Leonie Green, M.D., M.S. Interventional Cardiologist   Pager # [phone]

## 2015-01-03 DIAGNOSIS — Z7189 Other specified counseling: Secondary | ICD-10-CM

## 2015-01-03 DIAGNOSIS — R109 Unspecified abdominal pain: Secondary | ICD-10-CM

## 2015-01-03 DIAGNOSIS — I255 Ischemic cardiomyopathy: Secondary | ICD-10-CM

## 2015-01-03 DIAGNOSIS — R1084 Generalized abdominal pain: Secondary | ICD-10-CM | POA: Insufficient documentation

## 2015-01-03 LAB — RENAL FUNCTION PANEL
Albumin: 2.9 g/dL — ABNORMAL LOW (ref 3.5–5.0)
Anion gap: 23 — ABNORMAL HIGH (ref 5–15)
BUN: 40 mg/dL — ABNORMAL HIGH (ref 6–20)
CO2: 22 mmol/L (ref 22–32)
Calcium: 8.8 mg/dL — ABNORMAL LOW (ref 8.9–10.3)
Chloride: 92 mmol/L — ABNORMAL LOW (ref 101–111)
Creatinine, Ser: 7.33 mg/dL — ABNORMAL HIGH (ref 0.61–1.24)
GFR calc Af Amer: 8 mL/min — ABNORMAL LOW (ref >60)
GFR calc non Af Amer: 7 mL/min — ABNORMAL LOW (ref >60)
GLUCOSE: 113 mg/dL — AB (ref 65–99)
PHOSPHORUS: 7.2 mg/dL — AB (ref 2.5–4.6)
POTASSIUM: 6.3 mmol/L — AB (ref 3.5–5.1)
Sodium: 137 mmol/L (ref 135–145)

## 2015-01-03 LAB — CBC
HCT: 38.7 % — ABNORMAL LOW (ref 39.0–52.0)
Hemoglobin: 12.2 g/dL — ABNORMAL LOW (ref 13.0–17.0)
MCH: 30.5 pg (ref 26.0–34.0)
MCHC: 31.5 g/dL (ref 30.0–36.0)
MCV: 96.8 fL (ref 78.0–100.0)
Platelets: 306 10*3/uL (ref 150–400)
RBC: 4 MIL/uL — AB (ref 4.22–5.81)
RDW: 17.6 % — ABNORMAL HIGH (ref 11.5–15.5)
WBC: 22.1 10*3/uL — ABNORMAL HIGH (ref 4.0–10.5)

## 2015-01-03 LAB — GLUCOSE, CAPILLARY
GLUCOSE-CAPILLARY: 106 mg/dL — AB (ref 65–99)
Glucose-Capillary: 88 mg/dL (ref 65–99)
Glucose-Capillary: 82 mg/dL (ref 65–99)

## 2015-01-03 LAB — HEPARIN LEVEL (UNFRACTIONATED): Heparin Unfractionated: 0.53 IU/mL (ref 0.30–0.70)

## 2015-01-03 MED ORDER — HYDROMORPHONE HCL 1 MG/ML IJ SOLN
INTRAMUSCULAR | Status: AC
  Filled 2015-01-03: qty 1

## 2015-01-03 MED ORDER — HYDROMORPHONE HCL 1 MG/ML IJ SOLN
0.5000 mg | INTRAMUSCULAR | Status: DC | PRN
  Administered 2015-01-03 – 2015-01-05 (×12): 0.5 mg via INTRAVENOUS
  Filled 2015-01-03 (×10): qty 1

## 2015-01-03 NOTE — Progress Notes (Signed)
Pt transferred to hemodialysis with telemetry and HD RN

## 2015-01-03 NOTE — Progress Notes (Signed)
[place] for heparin Indication: ACS/PAF history on warfarin pta   Labs:  Recent Labs  01/01/15 0213  01/02/15 0242 01/02/15 1250 01/03/15 0424  HGB 11.9*  --  11.7*  --  12.2*  HCT 38.4*  --  37.9*  --  38.7*  PLT 279  --  282  --  306  HEPARINUNFRC 0.24*  < > 0.17* 0.32 0.53  CREATININE 6.34*  --  5.84*  --  7.33*  < > = values in this interval not displayed.  Estimated Creatinine Clearance: 13.1 mL/min (by C-G formula based on Cr of 7.33).  Assessment: 54 yom with ESRD - HD TTS, PAF on warfarin pta. ECHO at [facility] showed severe aortic stenosis with AVA 0.99, RV dysfunction, and EF 15%. Myoview - CFX ischemia. Transferred to [facility] for cath but felt he should be cleared from GI prior to cath due to hematemesis on admit. Cleared for cath but transferred to [facility] for procedure.   Pharmacy consulted to dose heparin for ACS/PAF  PMH: GERD, HTN, neuropathy, PAF, HLD, gout, CAD, CHF, COPD, Mobitz 1 AVB, PVD, DM2, ESRD - HD TTS, depression, basal cell carcinoma  AC: heparin for ACS/PAF history (on coumadin pta). Coumadin held & started on heparin drip prior to arrival to [facility]. R/LHC 7/19, no interventions made -sever CAD TCTS consult felt not to be surgical candidate,cardiology does not feel appropriate for stenting. Limited options exist. To continue heparin for now, palliative care has been consulted. [place] meeting with family scheduled for this morning. Therapeutic on heparin 2600 units/hr  HL (0.53).   INR 1.4. H/H stable, plt wnl - received Vit K 5mg  po at OSH 7/15   PTA warfarin dose: 5mg , except 2.5mg  on TThSat (last dose 7/13)   Goal of Therapy:  Heparin level 0.3-0.7 units/ml Monitor platelets by anticoagulation protocol: Yes   Plan:  -Continue heparin 2600 units/hr -Daily HL and CBC  Heloise Ochoa, [place].D.  01/03/2015 9:47 AM   Discussed and agree with above  Bonnita Nasuti Pharm.D. CPP, BCPS Clinical  Pharmacist [phone] 01/03/2015 10:05 AM

## 2015-01-03 NOTE — Progress Notes (Signed)
[facility] PROGRESS NOTE  MURPHY DUZAN HQI:[id number] DOB: Nov 21, 1950 DOA: 12/31/2014 PCP: Christinia Gully, MD  64 year old male with chronic A. 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 He was [facility] where he underwent echo showing an EF of 15% (echo with EF of about 50%). He was also found to have a critical care and a Myoview done showing reversible ischemia. During the hospitalization he also had mild hematemesis and underwent EGD which showed a gastric polyp. It was planned for a cardiac cath but since his cardiologist is in [place] (Dr. Rayann Heman) he was transferred here and admitted by cardiology. Hospitalists consulted for management of uncontrolled diabetes mellitus.  Assessment/Plan: Uncontrolled type 2 diabetes mellitus Poorly controlled as outpatient with A1c of 9.1. Added glargine with meal coverage. Given colitis and poor po intake, will switch to sliding scale only. FSG stable.   Acute systolic CHF, mild NSTEMI with reversible ischemia on Myoview and critical AS Seen by [facility]. . Not a candidate for valve replacement and CABG due to completely calcified porcelain aorta with severe biventricular dysfunction decompensated CHF. Patient tired of his underlying condition and HD. Requests palliative consult. [place] this [place]. Patient says he has to run some errands this weekend and will be clear about what he wants to do in next 2-3 days. Wants to continue HD for now.   Sepsis with cdiff colitis  abdomien is distended and tender. Increased  leucocytosis. Patient refused CT abdomen. On prn dilaudid. tolerating diet. No sings of bowel obstrcution on xray from 7/21.Marland Kitchen  increased oral vancomycin dose to 500mg  qid for 2 weeks course. repeat Abdominal x ray in am to r/o ileus.  Chronic A. fib Currently on IV heparin drip. Coumadin on hold. Management per cardiology.  Peripheral artery disease with right toe ulcer Status post left BKA. Has right toe ulcer. Sees Dr. Sharol Given as outpatient. Basilar surgery consulted recommended diagnostic arteriogram once cardiac issues stable but suggested patient may need a BKA as appears nonsalvageable ulcer.  End-stage renal disease On scheduled dialysis. Renal following  Code Status: Full code Family Communication: None at bedside Disposition Plan: Per primary. Overall prognosis is poor  Will sign off . Please call for any questions.        Antibiotics:  None  HPI/Subjective: Persistent abd pain and distention. Had palliative care meeting this am for [place]. Low BP noted. No N/V  Objective: Filed Vitals:   01/03/15 1139  BP: 91/34  Pulse:   Temp: 96.1 F (35.6 C)  Resp: 22    Intake/Output Summary (Last 24 hours) at 01/03/15 1355 Last data filed at 01/02/15 1900  Gross per 24 hour  Intake    456 ml  Output      0 ml  Net    456 ml   Filed Weights   12/13/2014 2025 01/01/15 1410 01/01/15 1701  Weight: 111.4 kg (245 lb 9.5 oz) 113 kg (249 lb 1.9 oz) 110.5 kg (243 lb 9.7 oz)    Exam:   General:  Fatigued   HEENT: Moist oral mucosa  Chest: Clear to auscultation bilaterally  CVS:  S1 and S2 irregular, systolic murmur 2/6  GI: mild distention with tender to palpation. BS +  Musculoskeletal: Left BKA, right toe ulcer, left upper extremity AV graft    Data Reviewed: Basic Metabolic Panel:  Recent Labs Lab 01/09/2015 0555 12/21/2014 0231 01/01/15 0213 01/02/15 0242 01/03/15 0424  NA 134* 133* 133* 139 137  K 5.7* 5.9* 5.2* 4.6 6.3*  CL 95* 96* 94* 100* 92*  CO2 23  18* 23 23 22   GLUCOSE 212* 207* 252* 135* 113*  BUN 34* 40* 31* 28* 40*  CREATININE 6.86* 7.20* 6.34* 5.84* 7.33*  CALCIUM 9.2 9.3 8.4* 9.4 8.8*  PHOS  --   --  6.0* 4.5 7.2*   Liver Function Tests:  Recent Labs Lab 12/26/2014 0555 01/01/15 0213 01/02/15 0242 01/03/15 0424  AST 17  --   --   --   ALT 14*  --   --   --   ALKPHOS 88  --   --   --   BILITOT 1.1  --   --   --   PROT 6.8  --   --   --   ALBUMIN 3.2* 2.9* 2.9* 2.9*   No results for input(s): LIPASE, AMYLASE in the last 168 hours. No results for input(s): AMMONIA in the last 168 hours. CBC:  Recent Labs Lab 12/29/2014 0555 12/29/2014 0231 01/01/15 0213 01/02/15 0242 01/03/15 0424  WBC 9.1 11.0* 12.1* 17.2* 22.1*  NEUTROABS 6.4  --   --   --   --   HGB 12.5* 12.5* 11.9* 11.7* 12.2*  HCT 39.6 39.7 38.4* 37.9* 38.7*  MCV 96.6 96.8 96.7 96.4 96.8  PLT 302 318 279 282 306   Cardiac Enzymes:  Recent Labs Lab 01/04/2015 0555  TROPONINI 0.15*   BNP (last 3 results)  Recent Labs  06/20/14 1241  BNP 1189.4*    ProBNP (last 3 results) No results for input(s): PROBNP in the last 8760 hours.  CBG:  Recent Labs Lab 01/02/15 1114 01/02/15 1606 01/02/15 2201 01/03/15 0730 01/03/15 1129  GLUCAP 126* 104* 118* 106* 88    Recent Results (from the past 240 hour(s))  MRSA PCR Screening     Status: None   Collection Time: 01/04/2015  3:49 PM  Result Value Ref Range Status   MRSA by PCR NEGATIVE NEGATIVE Final    Comment:        The GeneXpert MRSA Assay (FDA approved for NASAL specimens only), is one component of a comprehensive MRSA colonization surveillance program. It is not intended to diagnose MRSA infection nor to guide or monitor treatment for MRSA infections.   Clostridium Difficile by PCR (not at [facility])     Status: Abnormal   Collection Time: 01/02/15  2:14 AM  Result Value Ref Range Status   C difficile by pcr POSITIVE (A) NEGATIVE Final    Comment: CRITICAL RESULT CALLED TO, READ BACK BY  AND VERIFIED WITH: C. ARMITAGE RN 9:45 01/02/15 ([email or web address])      Studies: Dg Abd Portable 1v  01/02/2015   CLINICAL DATA:  Generalized abdominal pain  EXAM: PORTABLE ABDOMEN - 1 VIEW  COMPARISON:  01/07/2011  FINDINGS: Scattered large and small bowel gas is noted. Diffuse vascular calcifications are noted. No abnormal mass is identified. No definitive free air is seen. Degenerative changes of the lumbar spine are noted.  IMPRESSION: Nonspecific abdomen.  Aortic atherosclerotic disease.   Electronically Signed   By: Inez Catalina M.D.   On: 01/02/2015 09:53  Scheduled Meds: . antiseptic oral rinse  7 mL Mouth Rinse BID  . aspirin  81 mg Oral Daily  . calcium acetate  1,334 mg Oral TID WC  . cinacalcet  30 mg Oral Q breakfast  . gabapentin  300 mg Oral QHS  . insulin aspart  0-20 Units Subcutaneous TID WC  . insulin aspart  0-5 Units Subcutaneous QHS  . insulin aspart  2 Units Subcutaneous TID WC  . metoprolol tartrate  12.5 mg Oral BID  . multivitamin  1 tablet Oral QHS  . sodium chloride  3 mL Intravenous Q12H  . sodium chloride  3 mL Intravenous Q12H  . sodium chloride  3 mL Intravenous Q12H  . tiotropium  18 mcg Inhalation Daily  . vancomycin  500 mg Oral QID   Continuous Infusions: . heparin 2,600 Units/hr (01/03/15 0033)      Time spent: 20 minutes    Takari Lundahl, [place] Pager [phone] If 7PM-7AM, please contact night-coverage at [email or web address], password [facility] 01/03/2015, 1:55 PM  LOS: 4 days

## 2015-01-03 NOTE — Consult Note (Signed)
Palliative care meeting set for today Will reevaluate pt on Monday if he wishes to pursue further care.  Ruta Hinds, MD [facility] Office: [phone] Pager: [phone]

## 2015-01-03 NOTE — Consult Note (Signed)
   [facility] Inpatient Consult   01/03/2015  [name] 12-12-1950 [id number]   Patient has been active with [facility] Care Management in the past based on having [facility] insurance. Came to bedside, however he was off the unit. Will follow up at later time.   Marthenia Rolling, MSN-Ed, RN,BSN [facility] Liaison [phone]

## 2015-01-03 NOTE — Consult Note (Signed)
Consultation Note Date: 01/03/2015   Patient Name: [name]  DOB: 1950/09/15  MRN: [id number]  Age / Sex: 64 y.o., male   PCP: Tanda Rockers, MD Referring Physician: Louellen Molder, MD  Reason for Consultation: Establishing GOC, psycho-social support  Palliative Care Assessment and Plan Summary of Established Goals of Care and Medical Treatment Preferences    Palliative Care Discussion Held Today:    This NP Wadie Lessen reviewed medical records, received report from team, assessed the patient and then meet at the patient's bedside along with wife/Linda and three children  to discuss diagnosis, prognosis, GOC, EOL wishes disposition and options.  A detailed discussion was had today regarding advanced directives.  Concepts specific to code status, artifical feeding and hydration, continued IV antibiotics, cessation of dialysis and rehospitalization was had.  The difference between a aggressive medical intervention path  and a palliative comfort care path for this patient at this time was had.  Values and goals of care important to patient and family were attempted to be elicited.  Discussed concept of Hospice and Palliative Care.  Encouraged utilization of hospice benefit for family specific to grief and bereavement for adults and children.  Natural trajectory and expectations at EOL were discussed.  Questions and concerns addressed.  Hard Choices booklet left for review.   MOST form was introduce.  Family encouraged to call with questions or concerns.  PMT will continue to support holistically.  Patient and family  were able to verbalize understanding of overall limited prognosis.  All hope for prolonged quality of life, but ultimately want to avoid suffering.      Goals of Care/Code Status/Advance Care Planning:   Code Status:  DNR/DNI  Dialysis: continue for now, patient is hopeful for " a few more weeks to get my affairs in order", he understands continued dialysis is  reaching its limit  Artificial feeding: not now or in the future  Antibiotics: yes  Diagnostics: yes   Symptom Management:    Pain: Dilaudid 1 mg IV every 3 hrs prn   Psycho-social/Spiritual:    Support System: Family and large circle of friends, patient prides himself with "so many "real" friends", played in band with friends over years.  Patient's sister is incarcerated  in a maximum security prison in [place]. One of  Mr. Vanover main EOL wishes is to see his sister before he dies.  I communicated with information with LCSW Kendell Bane, she is going to attempt to facilitate this meeting.  Prognosis:  Overall prognosis is poor, if/when patient makes decision to stop dialysis we are looking at prognosis of less than 2 weeks.  Discharge Planning: Again dependant on patient's decisions regarding medical interventions.  We discussed in detail home hospice vs inpatient hospice facility.       Chief Complaint: weakness,   History of Present Illness:  64 year old male with chronic A. fib on Coumadin, ESRD  on dialysis X4 years, peripheral artery disease status post left BKA, (current followed for right third toe ulcer), COPD, ongoing tobacco use, uncontrolled type 2 diabetes mellitus (insulin-dependent with diabetic neuropathy), gout and hypertension who was vacationing in [facility] and when visited for his dialysis session was found to be hypotensive became lightheaded and was sent to a local ED with findings of a troponin of 0.2. He was then transferred to hospital,  echo positive for EF of 15% .   Uncontrolled type 2 diabetes mellitus Poorly controlled as outpatient with A1c of 9.1. Added glargine with  meal coverage. Given colitis and poor po intake, will switch to sliding scale only. FSG stable.  Acute systolic CHF, mild NSTEMI with reversible ischemia on Myoview and critical AS Seen by [facility]. . 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 Patient tired of his underlying condition and HD. Requests palliative consult. [place] meeting this am. Patient says he has to run some errands this weekend and will be clear about what he wants to do in next 2-3 days. Wants to continue HD for now.   Sepsis with cdiff colitis abdomien is distended and tender. Increased leucocytosis. Patient refused CT abdomen. On prn dilaudid. tolerating diet. No sings of bowel obstrcution on xray from 7/21.Marland Kitchen increased oral vancomycin dose to 500mg  qid for 2 weeks course. repeat Abdominal x ray in am to r/o ileus.  Chronic A. fib Currently on IV heparin drip. Coumadin on hold. Management per cardiology.  Peripheral artery disease with right toe ulcer Status post left BKA. Has right toe ulcer. Sees Dr. Sharol Given as outpatient. Basilar surgery consulted recommended diagnostic arteriogram once cardiac issues stable but suggested patient may need a BKA as appears nonsalvageable ulcer.  End-stage renal disease On scheduled dialysis. Renal following   Patient is faced with advanced directive decisions and anticipatory care needs  Primary Diagnoses  Present on Admission:  . Type 2 diabetes with complication . Essential hypertension . Coronary atherosclerosis of native coronary artery . SVT/ PSVT/ PAT . Permanent atrial fibrillation . Ulcer of Rt foot- Dr Sharol Given follows . Smoking . PVD- Lt BKA 2014 . Abnormal nuclear cardiac imaging test . Cardiomyopathy, ischemic . Second degree Mobitz I AV block . Hyperlipidemia with target LDL less than 70 . Critical lower limb ischemia  Palliative Review of Systems:    -weakness, abdominal pain uncontrolled with current medication   I have reviewed the medical record, interviewed the patient and family, and examined the patient. The following aspects are pertinent.  Past Medical History  Diagnosis Date  . GERD (gastroesophageal reflux disease)   .  Hypertension   . Neuromuscular disorder     neurophathy  . Atrial fibrillation     chronically anticoagulated with coumadin  . Hyperlipidemia   . Atrial flutter 07/07/2009    s/p ablation  . GOUT 08/01/2008  . ERECTILE DYSFUNCTION 08/01/2008  . PERIPHERAL NEUROPATHY 08/01/2008  . CORONARY ARTERY DISEASE 08/01/2008    non obstructive per cath 1/10; Dr. Olevia Perches  . 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 Gastroparesis 08/01/2008  . NEPHROLITHIASIS, HX OF 08/01/2008  . Chronic systolic heart failure 11/15/8451  . COPD 07/21/2010  . Diverticulosis of colon (without mention of hemorrhage) 10/28/2010  . Benign neoplasm of colon 10/28/2010  . Hemorrhoids, internal 10/28/2010  . Second degree Mobitz I AV block     asymptomatic  . Cyst     "I've had 4-5; pretty much all of them have got infected" (06/13/2013)  . Muscle spasm of back     back muscle spasms and pt is unable to lay flat   . Abscess of back     lower middle portion of back  . Peripheral vascular disease   . Pneumonia   . Shortness of breath   . Type II diabetes mellitus dx'd 1980's  . Migraines     "none since the 1980's" (06/13/2013)  . End stage renal failure on dialysis 08/01/2008    "[employer];  TTS" (06/13/2013)  . Renal cyst     bilateral  . Basal cell carcinoma     nose  . Depression     situaltional   . Aortic stenosis 01/01/2015  . Coronary atherosclerosis of native coronary artery 08/01/2008  . Cardiomyopathy, ischemic 12/17/2014  . S/P below knee amputation 02/02/2013    LEFT   History   Social History  . Marital Status: Married    Spouse Name: N/A  . Number of Children: 3  . Years of Education: N/A   Occupational History  . Sales Rep    Social History Main Topics  . Smoking status: Former Smoker -- 0.50 packs/day for 44 years    Types: Cigarettes    Quit date: 12/09/2014  . Smokeless tobacco: Never Used  . Alcohol Use: No     Comment: 06/13/2013 "no alcohol in 40 yrs"  . Drug Use: No      Comment: 06/13/2013 "used marijuana years ago"  . Sexual Activity: Not Currently   Other Topics Concern  . None   Social History Narrative   Pharmacist, hospital         Family History  Problem Relation Age of Onset  . Diabetes Maternal Grandmother   . Diabetes Paternal Grandmother   . Cancer Neg Hx     no FH of Colon Cancer  . Diabetes Father    Scheduled Meds: . antiseptic oral rinse  7 mL Mouth Rinse BID  . aspirin  81 mg Oral Daily  . calcium acetate  1,334 mg Oral TID WC  . cinacalcet  30 mg Oral Q breakfast  . gabapentin  300 mg Oral QHS  . insulin aspart  0-20 Units Subcutaneous TID WC  . insulin aspart  0-5 Units Subcutaneous QHS  . insulin aspart  2 Units Subcutaneous TID WC  . metoprolol tartrate  12.5 mg Oral BID  . multivitamin  1 tablet Oral QHS  . sodium chloride  3 mL Intravenous Q12H  . sodium chloride  3 mL Intravenous Q12H  . sodium chloride  3 mL Intravenous Q12H  . tiotropium  18 mcg Inhalation Daily  . vancomycin  500 mg Oral QID   Continuous Infusions: . heparin 2,600 Units/hr (01/03/15 0033)   PRN Meds:.sodium chloride, sodium chloride, sodium chloride, acetaminophen, albuterol, ALPRAZolam, HYDROcodone-acetaminophen, nitroGLYCERIN, ondansetron (ZOFRAN) IV, sodium chloride, sodium chloride, sodium chloride, temazepam, zolpidem Medications Prior to Admission:  Prior to Admission medications   Medication Sig Start Date End Date Taking? Authorizing Provider  albuterol (PROVENTIL HFA;VENTOLIN HFA) 108 (90 BASE) MCG/ACT inhaler Inhale 2 puffs into the lungs every 4 (four) hours as needed for wheezing or shortness of breath. Patient taking differently: Inhale 2 puffs into the lungs daily as needed for wheezing or shortness of breath.  06/17/13  Yes Shanker Kristeen Mans, MD  calcium acetate (PHOSLO) 667 MG capsule Take 1,334 mg by mouth 3 (three) times daily with meals.    Yes Historical Provider, MD  cinacalcet (SENSIPAR) 30 MG tablet Take 30 mg  by mouth at bedtime.   Yes Historical Provider, MD  COUMADIN 5 MG tablet TAKE AS DIRECTED BY COUMADIN CLINIC Patient taking differently: TAKE 1/2 TABLET (2.5 MG) ON TUES & THURS AFTER SUPPER,  TAKE 1 TABLET (5 MG) ON ALL OTHER DAYS OR AS DIRECTED BY COUMADIN CLINIC 12/03/14  Yes Thompson Grayer, MD  folic acid-vitamin b complex-vitamin c-selenium-zinc (DIALYVITE) 3 MG TABS tablet Take 1 tablet by mouth at bedtime.    Yes Historical  Provider, MD  gabapentin (NEURONTIN) 300 MG capsule Take 300 mg by mouth at bedtime.    Yes Historical Provider, MD  HYDROcodone-acetaminophen (NORCO) 5-325 MG per tablet Take 1 tablet by mouth every 6 (six) hours as needed. Patient taking differently: Take 0.5 tablets by mouth 2 (two) times daily as needed (pain).  05/03/14  Yes Alvia Grove, PA-C  lanthanum (FOSRENOL) 1000 MG chewable tablet Chew 2,000 mg by mouth 3 (three) times daily with meals.   Yes Historical Provider, MD  loperamide (IMODIUM A-D) 2 MG tablet Take 2 mg by mouth daily as needed for diarrhea or loose stools.   Yes Historical Provider, MD  NOVOLOG FLEXPEN 100 UNIT/ML FlexPen INJECT 30 UNITS INTO THE SKIN 3 TIMES DAILY BEFORE MEALS. 12/03/14  Yes Renato Shin, MD  omeprazole (PRILOSEC OTC) 20 MG tablet Take 20 mg by mouth at bedtime.   Yes Historical Provider, MD  temazepam (RESTORIL) 30 MG capsule Take 30 mg by mouth at bedtime as needed for sleep.    Yes Historical Provider, MD  tiotropium (SPIRIVA) 18 MCG inhalation capsule Place 1 capsule (18 mcg total) into inhaler and inhale daily. Patient taking differently: Place 18 mcg into inhaler and inhale at bedtime.  06/17/13  Yes Shanker Kristeen Mans, MD  varenicline (CHANTIX) 0.5 MG tablet Take 1 tablet (0.5 mg total) by mouth daily. Patient taking differently: Take 0.5 mg by mouth at bedtime.  09/30/14  Yes Imogene Burn, PA-C  omeprazole (PRILOSEC) 20 MG capsule Take 1 capsule (20 mg total) by mouth daily. Patient not taking: Reported on 12/17/2014 03/29/14    Renato Shin, MD  UNIFINE PENTIPS 31G X 5 MM MISC USE AS DIRECTED 3 TIMES A DAY 07/09/14   Renato Shin, MD   No Known Allergies CBC:    Component Value Date/Time   WBC 22.1* 01/03/2015 0424   HGB 12.2* 01/03/2015 0424   HCT 38.7* 01/03/2015 0424   PLT 306 01/03/2015 0424   MCV 96.8 01/03/2015 0424   NEUTROABS 6.4 01/11/2015 0555   LYMPHSABS 1.5 12/17/2014 0555   MONOABS 0.8 01/11/2015 0555   EOSABS 0.2 12/23/2014 0555   BASOSABS 0.1 12/27/2014 0555   Comprehensive Metabolic Panel:    Component Value Date/Time   NA 137 01/03/2015 0424   K 6.3* 01/03/2015 0424   CL 92* 01/03/2015 0424   CO2 22 01/03/2015 0424   BUN 40* 01/03/2015 0424   CREATININE 7.33* 01/03/2015 0424   GLUCOSE 113* 01/03/2015 0424   CALCIUM 8.8* 01/03/2015 0424   CALCIUM 9.4 01/05/2010 1310   AST 17 01/03/2015 0555   ALT 14* 01/04/2015 0555   ALKPHOS 88 01/04/2015 0555   BILITOT 1.1 12/28/2014 0555   PROT 6.8 12/31/2014 0555   ALBUMIN 2.9* 01/03/2015 0424    Physical Exam:  Vital Signs: BP 99/62 mmHg  Pulse 107  Temp(Src) 97.8 F (36.6 C) (Oral)  Resp 19  Ht 6' (1.829 m)  Wt 110.5 kg (243 lb 9.7 oz)  BMI 33.03 kg/m2  SpO2 97% SpO2: SpO2: 97 % O2 Device: O2 Device: Nasal Cannula O2 Flow Rate: O2 Flow Rate (L/min): 3 L/min Intake/output summary:  Intake/Output Summary (Last 24 hours) at 01/03/15 1045 Last data filed at 01/02/15 1900  Gross per 24 hour  Intake    684 ml  Output      0 ml  Net    684 ml   LBM: Last BM Date: 01/01/15 Baseline Weight: Weight: 113.8 kg (250 lb 14.1 oz) Most recent weight:  Weight: 110.5 kg (243 lb 9.7 oz)  Exam Findings:   General: Chronically ill appearing, NAD HEENT: moist buccal membranes, no exudate CVS: tachycardic Abd: slightly distended, NT +BS Extrem: Left BKA noted       Palliative Performance Scale: 30  %                Additional Data Reviewed: Recent Labs     01/02/15  0242  01/03/15  0424  WBC  17.2*  22.1*  HGB  11.7*  12.2*    PLT  282  306  NA  139  137  BUN  28*  40*  CREATININE  5.84*  7.33*     Time In: 1000 Time Out: 1130 Time Total: 90 minutes  Greater than 50%  of this time was spent counseling and coordinating care related to the above assessment and plan.  Discussed with  Dr Joelyn Oms  Signed by: Wadie Lessen, NP  Knox Royalty, NP  01/03/2015, 10:45 AM  Please contact Palliative Medicine Team phone at [phone] for questions and concerns.   See AMION for contact information

## 2015-01-03 NOTE — Progress Notes (Signed)
Inpatient Diabetes Program Recommendations  AACE/ADA: New Consensus Statement on Inpatient Glycemic Control (2013)  Target Ranges:  Prepandial:   less than 140 mg/dL      Peak postprandial:   less than 180 mg/dL (1-2 hours)      Critically ill patients:  140 - 180 mg/dL   Noted poor po intake. May need only correction insulin as ordered until po intake improves.  Thank you Rosita Kea, RN, MSN, CDE  Diabetes Inpatient Program Office: [phone] Pager: [phone] 8:00 am to 5:00 pm

## 2015-01-03 NOTE — Progress Notes (Signed)
   As per yesterday - plan appears to be moving towards Palliative Care.   Will await recommendations from planned meeting today & forgo formal rounding until more decisions are made.  Will place hold parameters on BB for low BB concern & d/c statin.  Leonie Man, M.D., M.S. Interventional Cardiologist   Pager # [phone]

## 2015-01-04 DIAGNOSIS — I5023 Acute on chronic systolic (congestive) heart failure: Secondary | ICD-10-CM

## 2015-01-04 LAB — GLUCOSE, CAPILLARY
GLUCOSE-CAPILLARY: 130 mg/dL — AB (ref 65–99)
Glucose-Capillary: 110 mg/dL — ABNORMAL HIGH (ref 65–99)
Glucose-Capillary: 92 mg/dL (ref 65–99)
Glucose-Capillary: 111 mg/dL — ABNORMAL HIGH (ref 65–99)

## 2015-01-04 LAB — RENAL FUNCTION PANEL
ALBUMIN: 2.6 g/dL — AB (ref 3.5–5.0)
Anion gap: 24 — ABNORMAL HIGH (ref 5–15)
BUN: 31 mg/dL — ABNORMAL HIGH (ref 6–20)
CHLORIDE: 94 mmol/L — AB (ref 101–111)
CO2: 17 mmol/L — AB (ref 22–32)
Calcium: 8.5 mg/dL — ABNORMAL LOW (ref 8.9–10.3)
Creatinine, Ser: 5.79 mg/dL — ABNORMAL HIGH (ref 0.61–1.24)
GFR, EST AFRICAN AMERICAN: 11 mL/min — AB (ref >60)
GFR, EST NON AFRICAN AMERICAN: 9 mL/min — AB (ref >60)
Glucose, Bld: 92 mg/dL (ref 65–99)
POTASSIUM: 5.5 mmol/L — AB (ref 3.5–5.1)
Phosphorus: 8.2 mg/dL — ABNORMAL HIGH (ref 2.5–4.6)
SODIUM: 135 mmol/L (ref 135–145)

## 2015-01-04 LAB — CBC
HCT: 36.9 % — ABNORMAL LOW (ref 39.0–52.0)
Hemoglobin: 11.2 g/dL — ABNORMAL LOW (ref 13.0–17.0)
MCH: 29.9 pg (ref 26.0–34.0)
MCHC: 30.4 g/dL (ref 30.0–36.0)
MCV: 98.7 fL (ref 78.0–100.0)
PLATELETS: 268 10*3/uL (ref 150–400)
RBC: 3.74 MIL/uL — ABNORMAL LOW (ref 4.22–5.81)
RDW: 17.6 % — ABNORMAL HIGH (ref 11.5–15.5)
WBC: 17.8 10*3/uL — AB (ref 4.0–10.5)

## 2015-01-04 LAB — HEPARIN LEVEL (UNFRACTIONATED)

## 2015-01-04 MED ORDER — HEPARIN SODIUM (PORCINE) 1000 UNIT/ML DIALYSIS: 20.0000 [IU]/kg | INTRAMUSCULAR | Status: DC | PRN

## 2015-01-04 MED ORDER — HYDROMORPHONE HCL 1 MG/ML IJ SOLN
INTRAMUSCULAR | Status: AC
  Administered 2015-01-04: 0.5 mg via INTRAVENOUS
  Filled 2015-01-04: qty 1

## 2015-01-04 MED ORDER — ALBUTEROL SULFATE (2.5 MG/3ML) 0.083% IN NEBU
INHALATION_SOLUTION | RESPIRATORY_TRACT | Status: AC
  Administered 2015-01-04: 2.5 mg via RESPIRATORY_TRACT
  Filled 2015-01-04: qty 3

## 2015-01-04 NOTE — Progress Notes (Signed)
CSW notified of need to assist patient and wife with assistance to arrange for patient's sister to come and visit patient. She is currently in a maximum security prison in [place].  CSW spoke with patient's wife Vaughan Basta this afternoon and instructed her to provide CSW with name of prison where patient's sister is and would contact the prison's Chaplain to begin arrangements for a visit.  Vaughan Basta called back this afternoon and stated that she spoke directly with the Chaplain at the [facility] and was notified that her sister-in-law would not be able to visit unless or until the MD could indicate in writing that patient's death was imminent or anticipated in the next week.  While patient is now a DNR- he has elected to continue hemodialysis for about another week and thus family is unsure as to the possible length of time that patient has left.  Wife stated she will contact MD and as attempt to obtain MD's thoughts as to anticipated life expectancy and at that point- MD would need to provide written notification to be present to the [place]. Patient's sister would be allowed to either visit prior to his passing or to attend the funeral.  Mrs. Franko states that she wants to come and see patient while he is still alive.  CSW will follow up with MD first of the week in attempt to determine thoughts on life expectancy and assist patient and family as possible with completion of arrangements for on-site visit by sister.  Lorie Phenix. Pauline Good, [place]

## 2015-01-04 NOTE — Progress Notes (Signed)
Patient signed off treatment ama. Received 2 1/2  Of 3 hours treatment. Removed 1064 of 3liter goal. MD notified

## 2015-01-04 NOTE — Procedures (Signed)
I was present at this dialysis session. I have reviewed the session itself and made appropriate changes.   Pt seen 7/22 on HD.   Pearson Grippe  MD

## 2015-01-04 NOTE — Progress Notes (Signed)
Admit: 12/22/2014 LOS: 5  10M ESRD on iHD with NSTEMI, MV CAD, severe AS, new acute systolic HF  Subjective:  Palliative care met with pt, plan to transition to palliative care when some lingering personal affairs addressed HD yesterday, minimal UF K 5.5 this AM Legs tight, on NRB this AM Offered HD for K and UF, pt wishes to do short Tx today  07/22 0701 - 07/23 0700 In: 1031 [P.O.:430; I.V.:601] Out: 235   Filed Weights   01/11/2015 2025 01/01/15 1410 01/01/15 1701  Weight: 111.4 kg (245 lb 9.5 oz) 113 kg (249 lb 1.9 oz) 110.5 kg (243 lb 9.7 oz)    Scheduled Meds: . antiseptic oral rinse  7 mL Mouth Rinse BID  . aspirin  81 mg Oral Daily  . calcium acetate  1,334 mg Oral TID WC  . cinacalcet  30 mg Oral Q breakfast  . gabapentin  300 mg Oral QHS  . insulin aspart  0-20 Units Subcutaneous TID WC  . insulin aspart  0-5 Units Subcutaneous QHS  . metoprolol tartrate  12.5 mg Oral BID  . multivitamin  1 tablet Oral QHS  . sodium chloride  3 mL Intravenous Q12H  . sodium chloride  3 mL Intravenous Q12H  . tiotropium  18 mcg Inhalation Daily  . vancomycin  500 mg Oral QID   Continuous Infusions: . heparin Stopped (01/03/15 1900)   PRN Meds:.sodium chloride, sodium chloride, acetaminophen, albuterol, ALPRAZolam, HYDROcodone-acetaminophen, HYDROmorphone (DILAUDID) injection, nitroGLYCERIN, ondansetron (ZOFRAN) IV, sodium chloride, sodium chloride, temazepam, zolpidem  Current Labs: reviewed    Physical Exam:  Blood pressure 91/66, pulse 101, temperature 97.5 F (36.4 C), temperature source Oral, resp. rate 14, height 6' (1.829 m), weight 110.5 kg (243 lb 9.7 oz), SpO2 99 %. GEN: NAD ENT: NCAT EYES: EOMI CV: RRR, 3/6 MSM, no rub PULM: CTAB ant ABD: s/nt/nd SKIN: no rashes/lesios; RLE EXT:no LEE  Outpt HD Orders Unit: [facility] Days: THS Time: 4h97min Dialyzer: F180 EDW: 101.5kg K/Ca: 2/2 Access: LUE AVF Needle Size: 15g BFR/DFR: 500/a1.5 UF Proflie: 2 VDRA:  Hectorol 9 qTx EPO: No ESA IV Fe: No standing Fe Heparin: No heparin during Tx Most Recent Phos / PTH: 10.4 / 528 Most Recent TSAT: 41 Most Recent eKT/V: 1.37 Treatment Adherence: fair  A/P 1. ESRD:  1. On THS Schedule, 3h Tx today for K and UF 2. Goals of Care 1. Moving towards comfort measures over next days/week 2. Putting affairs in order first 3. HTN/Vol:  1. As above 4. Anemia: Hb stable no ESA as outpt 5. MBD:  1. Chronic hyperphos: stop binders 2. Stop cinacalcet 6. Reduced LVEF, NSTEMI, severe AS, MV CAD 1. Seen by CT surgery 2. No operative options and PCI declinded 3. DNR/DNI 7. Permanent AFib on Coumadin as outpt, hep gtt here  Pearson Grippe MD 01/04/2015, 7:33 AM   Recent Labs Lab 01/02/15 0242 01/03/15 0424 01/04/15 0220  NA 139 137 135  K 4.6 6.3* 5.5*  CL 100* 92* 94*  CO2 23 22 17*  GLUCOSE 135* 113* 92  BUN 28* 40* 31*  CREATININE 5.84* 7.33* 5.79*  CALCIUM 9.4 8.8* 8.5*  PHOS 4.5 7.2* 8.2*    Recent Labs Lab 01/12/2015 0555  01/02/15 0242 01/03/15 0424 01/04/15 0220  WBC 9.1  < > 17.2* 22.1* 17.8*  NEUTROABS 6.4  --   --   --   --   HGB 12.5*  < > 11.7* 12.2* 11.2*  HCT 39.6  < > 37.9*  38.7* 36.9*  MCV 96.6  < > 96.4 96.8 98.7  PLT 302  < > 282 306 268  < > = values in this interval not displayed.

## 2015-01-04 NOTE — Procedures (Signed)
I was present at this session.  I have reviewed the session itself and made appropriate changes.  bp low 100s.  See if can get vol off to help breathing and comfort.    Kirstein Baxley L 7/23/20162:28 PM

## 2015-01-04 NOTE — Progress Notes (Signed)
RN arrived on shift and heparin gtt had been turned off.  Pt states he does not want to be hooked up to IVs. MD called and made aware of pts request and pt is in afib. MD also made aware of palliative care consult. MD ok with having heparin gtt turned off. Will continue to monitor.

## 2015-01-04 NOTE — Progress Notes (Signed)
1245 Cardiac Rehab Noted that pt has chosen palliative care and he is not in need of our service. We will sign off, please reorder [place] if there is a change.

## 2015-01-04 NOTE — Progress Notes (Addendum)
Subjective:   No CP. He has decided on comfort care. HD being stopped.     Intake/Output Summary (Last 24 hours) at 01/04/15 1421 Last data filed at 01/04/15 1100  Gross per 24 hour  Intake    702 ml  Output    235 ml  Net    467 ml    Current meds: . antiseptic oral rinse  7 mL Mouth Rinse BID  . aspirin  81 mg Oral Daily  . gabapentin  300 mg Oral QHS  . insulin aspart  0-20 Units Subcutaneous TID WC  . insulin aspart  0-5 Units Subcutaneous QHS  . metoprolol tartrate  12.5 mg Oral BID  . multivitamin  1 tablet Oral QHS  . sodium chloride  3 mL Intravenous Q12H  . sodium chloride  3 mL Intravenous Q12H  . tiotropium  18 mcg Inhalation Daily  . vancomycin  500 mg Oral QID   Infusions:     Objective:  Blood pressure 112/75, pulse 100, temperature 97.6 F (36.4 C), temperature source Oral, resp. rate 14, height 6' (1.829 m), weight 110.5 kg (243 lb 9.7 oz), SpO2 96 %. Weight change:   Physical Exam: General:  Ill appearing. No resp difficulty HEENT: normal Neck: supple. JVP up . Carotids 2+ bilat; no bruits. No lymphadenopathy or thryomegaly appreciated. Cor: PMI laterally displaced. 3/6 AS Lungs: clear Abdomen: soft, nontender, nondistended. No hepatosplenomegaly. No bruits or masses. Good bowel sounds. Extremities: L BKA  mild edema R foot dusky Neuro: alert & orientedx3, cranial nerves grossly intact. moves all 4 extremities w/o difficulty. Affect pleasant  Telemetry: Sinus tach  Lab Results: Basic Metabolic Panel:  Recent Labs Lab 12/22/2014 0231 01/01/15 0213 01/02/15 0242 01/03/15 0424 01/04/15 0220  NA 133* 133* 139 137 135  K 5.9* 5.2* 4.6 6.3* 5.5*  CL 96* 94* 100* 92* 94*  CO2 18* 23 23 22  17*  GLUCOSE 207* 252* 135* 113* 92  BUN 40* 31* 28* 40* 31*  CREATININE 7.20* 6.34* 5.84* 7.33* 5.79*  CALCIUM 9.3 8.4* 9.4 8.8* 8.5*  PHOS  --  6.0* 4.5 7.2* 8.2*   Liver Function Tests:  Recent Labs Lab 12/26/2014 0555 01/01/15 0213 01/02/15 0242  01/03/15 0424 01/04/15 0220  AST 17  --   --   --   --   ALT 14*  --   --   --   --   ALKPHOS 88  --   --   --   --   BILITOT 1.1  --   --   --   --   PROT 6.8  --   --   --   --   ALBUMIN 3.2* 2.9* 2.9* 2.9* 2.6*   No results for input(s): LIPASE, AMYLASE in the last 168 hours. No results for input(s): AMMONIA in the last 168 hours. CBC:  Recent Labs Lab 01/12/2015 0555 12/26/2014 0231 01/01/15 0213 01/02/15 0242 01/03/15 0424 01/04/15 0220  WBC 9.1 11.0* 12.1* 17.2* 22.1* 17.8*  NEUTROABS 6.4  --   --   --   --   --   HGB 12.5* 12.5* 11.9* 11.7* 12.2* 11.2*  HCT 39.6 39.7 38.4* 37.9* 38.7* 36.9*  MCV 96.6 96.8 96.7 96.4 96.8 98.7  PLT 302 318 279 282 306 268   Cardiac Enzymes:  Recent Labs Lab 12/20/2014 0555  TROPONINI 0.15*   BNP: Invalid input(s): POCBNP CBG:  Recent Labs Lab 01/03/15 0730 01/03/15 1129 01/03/15 2125 01/04/15 0749 01/04/15 1142  GLUCAP  106* 88 82 130* 110*   Microbiology: Lab Results  Component Value Date   CULT  06/13/2013    NO GROWTH 5 DAYS Performed at [place]  06/13/2013    NO GROWTH 5 DAYS Performed at [place]  03/23/2013    FEW PROTEUS MIRABILIS Performed at [place]  01/18/2013    MULTIPLE ORGANISMS PRESENT, NONE PREDOMINANT Note: NO STAPHYLOCOCCUS AUREUS ISOLATED NO GROUP A STREP (S.PYOGENES) ISOLATED Performed at [place]  01/18/2013    NO ANAEROBES ISOLATED Performed at [facility]   No results for input(s): CULT, SDES in the last 168 hours.  Imaging: No results found.   ASSESSMENT:  1. A/C systolic HF 2. Severe AS 3. ESRD 4. Ischemic R foot 4. DNR  PLAN/DISCUSSION:  He has decided on comfort care. Will stop HD. Will ask Palliative Care if they can assume primary role. Start morphine gtt as needed.   LOS: 5 days    Glori Bickers, MD 01/04/2015, 2:21 PM

## 2015-01-05 DIAGNOSIS — Z992 Dependence on renal dialysis: Secondary | ICD-10-CM

## 2015-01-05 DIAGNOSIS — R1084 Generalized abdominal pain: Secondary | ICD-10-CM

## 2015-01-05 DIAGNOSIS — Z515 Encounter for palliative care: Secondary | ICD-10-CM

## 2015-01-05 DIAGNOSIS — N186 End stage renal disease: Secondary | ICD-10-CM

## 2015-01-05 LAB — CBC
HEMATOCRIT: 35.4 % — AB (ref 39.0–52.0)
HEMOGLOBIN: 11 g/dL — AB (ref 13.0–17.0)
MCH: 30 pg (ref 26.0–34.0)
MCHC: 31.1 g/dL (ref 30.0–36.0)
MCV: 96.5 fL (ref 78.0–100.0)
Platelets: 246 10*3/uL (ref 150–400)
RBC: 3.67 MIL/uL — AB (ref 4.22–5.81)
RDW: 17.4 % — ABNORMAL HIGH (ref 11.5–15.5)
WBC: 17.4 10*3/uL — AB (ref 4.0–10.5)

## 2015-01-05 LAB — GLUCOSE, CAPILLARY
Glucose-Capillary: 78 mg/dL (ref 65–99)
Glucose-Capillary: 45 mg/dL — ABNORMAL LOW (ref 65–99)
Glucose-Capillary: 68 mg/dL (ref 65–99)

## 2015-01-05 LAB — RENAL FUNCTION PANEL
Albumin: 2.6 g/dL — ABNORMAL LOW (ref 3.5–5.0)
Anion gap: 21 — ABNORMAL HIGH (ref 5–15)
BUN: 35 mg/dL — ABNORMAL HIGH (ref 6–20)
CALCIUM: 8.3 mg/dL — AB (ref 8.9–10.3)
CO2: 20 mmol/L — AB (ref 22–32)
CREATININE: 4.95 mg/dL — AB (ref 0.61–1.24)
Chloride: 94 mmol/L — ABNORMAL LOW (ref 101–111)
GFR calc non Af Amer: 11 mL/min — ABNORMAL LOW (ref >60)
GFR, EST AFRICAN AMERICAN: 13 mL/min — AB (ref >60)
Glucose, Bld: 146 mg/dL — ABNORMAL HIGH (ref 65–99)
PHOSPHORUS: 7.2 mg/dL — AB (ref 2.5–4.6)
Potassium: 6 mmol/L — ABNORMAL HIGH (ref 3.5–5.1)
SODIUM: 135 mmol/L (ref 135–145)

## 2015-01-05 MED ORDER — SODIUM POLYSTYRENE SULFONATE 15 GM/60ML PO SUSP
60.0000 g | Freq: Once | ORAL | Status: AC
  Administered 2015-01-05: 45 g via ORAL
  Filled 2015-01-05: qty 240

## 2015-01-05 MED ORDER — HYDROMORPHONE HCL 1 MG/ML IJ SOLN
0.5000 mg | INTRAMUSCULAR | Status: DC | PRN
  Administered 2015-01-05: 1 mg via INTRAVENOUS
  Filled 2015-01-05 (×2): qty 1

## 2015-01-05 MED ORDER — LORAZEPAM 2 MG/ML IJ SOLN
0.5000 mg | INTRAMUSCULAR | Status: DC | PRN
  Administered 2015-01-05 (×2): 1 mg via INTRAVENOUS
  Filled 2015-01-05 (×3): qty 1

## 2015-01-05 MED ORDER — HYDROMORPHONE HCL 1 MG/ML IJ SOLN
0.5000 mg | INTRAMUSCULAR | Status: DC
  Administered 2015-01-05: 0.5 mg via INTRAVENOUS
  Filled 2015-01-05: qty 1

## 2015-01-06 ENCOUNTER — Telehealth: Payer: Self-pay | Admitting: Cardiovascular Disease

## 2015-01-06 LAB — HEPATITIS B SURFACE ANTIGEN: Hepatitis B Surface Ag: NEGATIVE

## 2015-01-06 NOTE — Progress Notes (Signed)
Patient passed away yesterday.  CSW spoke with Mrs. Boddy today. She stated that patient's sister will be coming from the [place] [facility] on Thursday. She stated that the [facility] did not require any further information or intervention to complete this process. Mrs. Mackie was very appreciative of follow up and denied any further needs or questions at this time.  Lorie Phenix. Pauline Good, [place]

## 2015-01-06 NOTE — Telephone Encounter (Signed)
01/06/2015 Received deathl certificate on patient from [place] to be sign for cremation I took it to Dr. Ellyn Hack he filled it out I then made copy for our records and then put up front for pick-up. cbr

## 2015-01-07 ENCOUNTER — Ambulatory Visit: Payer: Self-pay | Admitting: Cardiology

## 2015-01-07 DIAGNOSIS — I482 Chronic atrial fibrillation, unspecified: Secondary | ICD-10-CM

## 2015-01-13 NOTE — Progress Notes (Signed)
  He is now on comfort care. Palliative Care managing meds. Awaiting more family members to arrive prior to starting morphine gtt.   Will defer management to Palliative Care and ask if they can take him on their service and transfer to Palliative unit.   Appreciate their assistance.   Gonsalo Cuthbertson,MD 11:08 AM

## 2015-01-13 NOTE — Progress Notes (Signed)
Daily Progress Note   Patient Name: [name]       Date: 2015-01-16 DOB: [birth year]  Age: 64 y.o. MRN#: [id number] Attending Physician: Leonie Man, MD Primary Care Physician: Christinia Gully, MD Admit Date: 01/09/2015  Reason for Consultation/Follow-up: Pain control, Terminal care and Withdrawal of life-sustaining treatment  Subjective: Pt is a 64 yo man with chronic a-fib ESRD on HD for 4 years, PAD with Left BKA, COPD, uncontrolled DM, HTN, gout and EF of 15%. He is being followed by Palliative Medicine for symptom mgt and EOL decisions. Pt was cont dialysis in hopes of having  More time to get his affairs in order then stop HD. Pt had dialysis yesterday but since that time has been minimally responsive. He is now on 100% NRB. Family is ready for comfort care, to stop dialysis. He has exhibited more pain when awake. Family is waiting for pt's sister to come from [employer] in [place] in hopes of seeing him before he dies. Interval Events: HD 01-15-2015. Now minimally responsive. Rapidly transitioning Length of Stay: 6 days  Current Medications: Scheduled Meds:  . antiseptic oral rinse  7 mL Mouth Rinse BID  .  HYDROmorphone (DILAUDID) injection  0.5 mg Intravenous 6 times per day    Continuous Infusions:    PRN Meds: acetaminophen, albuterol, HYDROmorphone (DILAUDID) injection, LORazepam, ondansetron (ZOFRAN) IV  Palliative Performance Scale: 10%     Vital Signs: BP 76/50 mmHg  Pulse 100  Temp(Src) 97.3 F (36.3 C) (Oral)  Resp 26  Ht 6' (1.829 m)  Wt 109.1 kg (240 lb 8.4 oz)  BMI 32.61 kg/m2  SpO2 100% SpO2: SpO2: 100 % O2 Device: O2 Device: NRB O2 Flow Rate: O2 Flow Rate (L/min): 15 L/min  Intake/output summary:  Intake/Output Summary (Last 24 hours) at 2015-01-16 1401 Last data filed at 01/16/15 1200  Gross per 24 hour  Intake    330 ml  Output      0 ml  Net    330 ml   LBM:   Baseline Weight: Weight: 113.8 kg (250 lb 14.1 oz) Most  recent weight: Weight: 109.1 kg (240 lb 8.4 oz)  Physical Exam: General: Well nourished man. Resting comfortably. unrepsonsive Resp: No work of breathing observed. Wearing 100% NRB Psych: No agitation Musculoskeletal: All extremities cool. Faint mottling to right knee            Additional Data Reviewed: Recent Labs     01/15/15  0220  Jan 16, 2015  0243  WBC  17.8*  17.4*  HGB  11.2*  11.0*  PLT  268  246  NA  135  135  BUN  31*  35*  CREATININE  5.79*  4.95*     Problem List:  Patient Active Problem List   Diagnosis Date Noted  . Abdominal pain 01/03/2015  . Abdominal pain, generalized   . Palliative care encounter 01/02/2015  . DNR (do not resuscitate) discussion 01/02/2015  . Enteritis due to Clostridium difficile   . Aortic stenosis 01/01/2015  . Critical lower limb ischemia 12/21/2014  . Elevated troponin 12/20/2014  . Warfarin anticoagulation 01/07/2015  . PVD- Lt BKA 2014 01/01/2015  . Abnormal nuclear cardiac imaging test 01/10/2015  . Cardiomyopathy, ischemic 01/06/2015  . Complication from renal dialysis device 04/15/2014  . Anemia 03/08/2014  . Routine general medical examination at a health care facility 03/08/2014  . Screening for prostate cancer 03/08/2014  . Smoking 07/04/2013  . Other emphysema 06/27/2013  .  Healthcare-associated pneumonia 06/13/2013  . HCAP (healthcare-associated pneumonia) 06/13/2013  . BKA stump complication 56/81/2751  . Dehiscence of amputation stump S/P irrigation and debridement 05/17/2013  . Insomnia 05/09/2013  . ESRD on dialysis 05/09/2013  . Diabetic osteomyelitis 03/09/2013  . S/P below knee amputation 02/02/2013  . Diabetes mellitus type 2, insulin dependent 01/21/2013  . Type I (juvenile type) diabetes mellitus with renal manifestations, uncontrolled 12/25/2011  . Headache(784.0) 08/26/2011  . Other testicular hypofunction 05/05/2011  . Special screening for malignant neoplasm of prostate 04/30/2011  . Second degree  Mobitz I AV block 10/28/2010  . Benign neoplasm of colon 10/28/2010  . Diverticulosis of colon (without mention of hemorrhage) 10/28/2010  . Hemorrhoids, internal 10/28/2010  . Permanent atrial fibrillation 07/21/2010  . DIARRHEA 07/21/2010  . WEAKNESS, LEFT SIDE OF BODY 07/08/2010  . Atrial fibrillation - s/p Ablation; now recurrent 09/24/2009  . Ulcer of Rt foot- Dr Sharol Given follows 09/15/2009  . SVT/ PSVT/ PAT 07/30/2009  . ATRIAL FLUTTER 07/07/2009  . ARTHROPATHY ASSOCIATED W/NEUROLOGICAL DISORDERS 08/19/2008  . MICROSCOPIC HEMATURIA 08/04/2008  . Type 2 diabetes with complication 70/06/7492  . Hyperlipidemia with target LDL less than 70 08/01/2008  . GOUT 08/01/2008  . ERECTILE DYSFUNCTION 08/01/2008  . PERIPHERAL NEUROPATHY 08/01/2008  . Essential hypertension 08/01/2008  . Coronary atherosclerosis of native coronary artery 08/01/2008  . GERD 08/01/2008  . GASTROPARESIS 08/01/2008  . NEPHROLITHIASIS, HX OF 08/01/2008     Palliative Care Assessment & Plan    Code Status:  DNR  Goals of Care:  Comfort care  Stop HD  Transfer out of 2H  Stop PO meds secondary to clinical status  Stop CBG and insulin  Desire for further Chaplaincy support:yes  Will contact [employer] in [place] and fax letter informing of imminent death to try and facilitate pt's sister's release to see him before he dies  3. Symptom Management:  Pain: Needs improvement: Schedule hydromorphone iv 0.5 mg q4 atc and 0.5-1mg  q2 prn. Monitor and titrate for effect  Dyspnea: Continue supportive 02 as tolerated by pt and family. Opioids primary mgt tool at this point. See above. Monitor and titrate for effect  Anxiety: Will DC xanax and order ativan IV 0.5-1mg  q2 prn  Congestion: Pt has been attempting to cough which family feels is making him uncomfortable. Will order robinul 0.2mg  iv q12 and q4 prn Pt is no longer eating and drinking.   5. Prognosis: Hours - Days    Care plan was  discussed with cardiology  Thank you for allowing the Palliative Medicine Team to assist in the care of this patient.   5. Discharge Planning: Anticipate hospital death  Time In: 1300 Time Out: 1340 Total Time 40 min Prolonged Time Billed  yes     Greater than 50%  of this time was spent counseling and coordinating care related to the above assessment and plan.   Dory Horn, NP  January 22, 2015, 2:01 PM  Please contact Palliative Medicine Team phone at [phone] for questions and concerns.

## 2015-01-13 NOTE — Progress Notes (Signed)
CSW called patient's wife [name] this afternoon after review of chart.  Per MD notes and wife- patient's condition has quickly deteriorated and now is receiving terminal care.  Per wife- Perry Curls, NP- Palliative care is faxing a letter to the prison Chaplain at the [facility] in [place] but he will not return to work until Architectural technologist.  Contact has been made with the Raechel Chute at the facility per wife but they were told that arrangements could not be made today to bring patient's sister to the hospital.  CSW reassured wife that a call will be made early tomorrow morning to reach the Chaplain to attempt to facilitate the transportation of patient's sister to [facility].  CSW will initiate this call in the morning after 7 am. Wife was very appreciative- however states that she is concerned that he might not survive until tomorrow.  CSW provided support and will follow up in the morning.  [name]. [name], [name]

## 2015-01-13 NOTE — Progress Notes (Signed)
Admit: 12/29/2014 LOS: 6  32M ESRD on iHD with NSTEMI, MV CAD, severe AS, new acute systolic HF  Subjective:  Poorly tolerated HD ysterday Didn't get much UF, K not improved Discussed with patient that HD is not effective for his goals Discussed transition to fully palliative measures, pt agrees  07/23 0701 - 07/24 0700 In: 1045 [P.O.:1045] Out: -   Filed Weights   01/01/15 1701 01/04/15 1433 01/04/15 1739  Weight: 110.5 kg (243 lb 9.7 oz) 113 kg (249 lb 1.9 oz) 109.1 kg (240 lb 8.4 oz)    Scheduled Meds: . antiseptic oral rinse  7 mL Mouth Rinse BID  . aspirin  81 mg Oral Daily  . gabapentin  300 mg Oral QHS  . insulin aspart  0-20 Units Subcutaneous TID WC  . insulin aspart  0-5 Units Subcutaneous QHS  . metoprolol tartrate  12.5 mg Oral BID  . multivitamin  1 tablet Oral QHS  . sodium chloride  3 mL Intravenous Q12H  . sodium chloride  3 mL Intravenous Q12H  . tiotropium  18 mcg Inhalation Daily  . vancomycin  500 mg Oral QID   Continuous Infusions:   PRN Meds:.sodium chloride, sodium chloride, acetaminophen, albuterol, ALPRAZolam, HYDROcodone-acetaminophen, HYDROmorphone (DILAUDID) injection, nitroGLYCERIN, ondansetron (ZOFRAN) IV, sodium chloride, sodium chloride, temazepam, zolpidem  Current Labs: reviewed    Physical Exam:  Blood pressure 98/31, pulse 81, temperature 97.3 F (36.3 C), temperature source Oral, resp. rate 16, height 6' (1.829 m), weight 109.1 kg (240 lb 8.4 oz), SpO2 99 %. GEN: NAD ENT: NCAT EYES: EOMI CV: RRR, 3/6 MSM, no rub PULM: CTAB ant ABD: s/nt/nd SKIN: no rashes/lesios; RLE EXT:no LEE  Outpt HD Orders Unit: [facility] Days: THS Time: 4h15min Dialyzer: F180 EDW: 101.5kg K/Ca: 2/2 Access: LUE AVF Needle Size: 15g BFR/DFR: 500/a1.5 UF Proflie: 2 VDRA: Hectorol 9 qTx EPO: No ESA IV Fe: No standing Fe Heparin: No heparin during Tx Most Recent Phos / PTH: 10.4 / 528 Most Recent TSAT: 41 Most Recent eKT/V: 1.37 Treatment  Adherence: fair  A/P 1. ESRD:  1. Will stop HD now, pt ready for full palliative measures 2. Goals of Care 1. Pt ready for fully palliative measuers 2. Short term HD wasn't working well 3. HTN/Vol:  1. As above 4. Anemia: Hb stable no ESA as outpt 5. MBD:  1. Chronic hyperphos: stop binders 2. Stop cinacalcet 6. Reduced LVEF, NSTEMI, severe AS, MV CAD 1. Seen by CT surgery 2. No operative options and PCI declinded 3. DNR/DNI 7. Permanent AFib on Coumadin as outpt, hep gtt here  Will check on patient again tomorrow.    Pearson Grippe MD 01-18-15, 7:28 AM   Recent Labs Lab 01/03/15 0424 01/04/15 0220 2015/01/18 0243  NA 137 135 135  K 6.3* 5.5* 6.0*  CL 92* 94* 94*  CO2 22 17* 20*  GLUCOSE 113* 92 146*  BUN 40* 31* 35*  CREATININE 7.33* 5.79* 4.95*  CALCIUM 8.8* 8.5* 8.3*  PHOS 7.2* 8.2* 7.2*    Recent Labs Lab 12/25/2014 0555  01/03/15 0424 01/04/15 0220 01-18-2015 0243  WBC 9.1  < > 22.1* 17.8* 17.4*  NEUTROABS 6.4  --   --   --   --   HGB 12.5*  < > 12.2* 11.2* 11.0*  HCT 39.6  < > 38.7* 36.9* 35.4*  MCV 96.6  < > 96.8 98.7 96.5  PLT 302  < > 306 268 246  < > = values in this interval not displayed.

## 2015-01-13 NOTE — Progress Notes (Signed)
Patient asystole, no heart tones or breath sounds auscultated by Allena Katz, RN & Lesli Albee, RN.  Attending MD notified.  Support offered to family.  Will continue to monitor. [place], Ardeth Sportsman

## 2015-01-13 DEATH — deceased

## 2015-01-14 NOTE — Discharge Summary (Signed)
Expiration Note  [name] MRN: [id number] DOB: April 15, 1951  Admit Date: 2015-01-18 Time & Date of Death:  5:56 pm  Attending Physician: Leonie Man, MD  PCP: Christinia Gully, MD Consults: Palliative Care Consult Service, [facility] (Dr. Joelyn Oms), Vascular Surgery (Dr. Oneida Alar), [facility] (Dr. Roxy Manns), [facility] (Dr. Clementeen Graham), Orhtopedics (Dr. Meridee Score)  Cause of Death: Withdrawal of Care, desire to stop Hemodialysis. -- Move to [place].  Secondary Diagnosis: Principal Problem:   Cardiomyopathy, ischemic Active Problems:   Type 2 diabetes with complication   Coronary atherosclerosis of native coronary artery   Permanent atrial fibrillation   ESRD on dialysis   Abnormal nuclear cardiac imaging test   Critical lower limb ischemia   Hyperlipidemia with target LDL less than 70   Essential hypertension   SVT/ PSVT/ PAT   Ulcer of Rt foot- Dr Sharol Given follows   Diabetes mellitus type 2, insulin dependent   Smoking   Warfarin anticoagulation   Second degree Mobitz I AV block   Elevated troponin   PVD- Lt BKA 2014   Aortic stenosis   S/P below knee amputation   Palliative care encounter   DNR (do not resuscitate) discussion   Enteritis due to Clostridium difficile   Abdominal pain   Abdominal pain, generalized    Procedures:  Left Heart Catheterization With Coronary Angiography  Ost 2nd Mrg to 2nd Mrg lesion, 100% stenosed.  LPDA-2 lesion, 90% stenosed.  LPDA-1 lesion, 70% stenosed.  Ost 4th Mrg lesion, 70% stenosed.  Prox RCA lesion, 75% stenosed.  Ost 2nd Diag to 2nd Diag lesion, 90% stenosed.  Dist LAD lesion, 90% stenosed.  Ost LAD lesion, 30% stenosed.  There is severe left ventricular systolic dysfunction.  Low gradient moderate aortic valve stenosis  Severe LV dysfunction with an ejection fraction of 10-15% with diffuse hypocontractility.  Low gradient at least moderate aortic valve stenosis with a calculated aortic valve area of  1.2 cm.  Significant elevation of right heart pressures and moderately severe to severe pulmonary hypertension with a PA systolic pressure of 60 mm Hg.   Transthoracic Echocardiogram:Impressions: - Severely decreased LVEF with LVEF 20-25%. Diffuse hypokinesis. Moderately decreased RVEF.  Mild aortic stenosis by gradients, however most probably underestimated by low flow and possibly severe.  Mild pulmonary hypertension.  Hemodialysis  Hostpital Course: The patient was admitted to [facility] on [facility]18[facility]2023 in transfer from [place], [place] with the diagnosis of severe aortic stenosis and cardiomyopathy with EF of 15% other critical history that he has CAD status post left BKA. He also noted a very cold painful right leg. As part of his initial evaluation he was seen by Dr. Sharol Given, who felt that he needed vascular surgery for ischemic right lower extremity. He was seen by vascular surgery, but they were waiting decisions about cardiac issues prior to proceeding. They felt like he probably would be pending BKA versus AKA. Nephrology was consult for hemodialysis. The patient was borderline hypotensive and complaining of significant dyspnea. He did undergo dialysis, however he began to indicate that he did not want to have any further dialysis long-term. 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 With multivessel disease and aortic stenosis, he was seen by Dr. Roxy Manns from [place] felt the patient was not a candidate for operative therapy. After reviewing his coronary angiography with interventional colleagues, was also felt that and the extent of calcification in his coronary arteries, he would  not be a very good PCI candidate either.he was on IV antibiotics for a possible pneumonia - this was managed by the hospitalist service.  After taking the consideration the lack of any potential interventional options, and as per  persistent heart failure and anginal symptoms, the patient became progressively more depressed in mood. He was clear thought in mind, and after discussion with nephrology, decided he no longer wanted to have hemodialysis performed. He knew that he was to be basic cardiac invalid who name and I'm going to survive his right leg amputation.based on this decision, the palliative care team's consult it. After family meeting, the patient and family verbalized understanding of the limited prognosis and also for prolonged quality of life was limited. He felt that he wanted to avoid further suffering and wants to proceed with palliative/comfort care. The initial plan was for him to continue dialysis until he had  Enough family members to arive to say goodbye.  On July 24, [facility] was to [facility].  PO meds & insulin were discontinue & Full Comfort Care was initiated.  He was started on PRN Dilaudid with supportive O2.  He was no longer eating or drinking. I was notified of the patient's death (since I was listed as Attending - despite not having rounded on him for several days) at ~ 6 pm, shortly after his confirmed time of death.   Leonie Man, M.D., M.S. [facility] [address] [place], [place]  [address]  [phone]  01/14/2015 4:09 PM

## 2015-01-22 ENCOUNTER — Ambulatory Visit (INDEPENDENT_AMBULATORY_CARE_PROVIDER_SITE_OTHER): Payer: 59 | Admitting: Ophthalmology

## 2015-04-05 IMAGING — CR DG RIBS W/ CHEST 3+V*R*
3 series · 3 of 3 positions shown · non-contrast
Comparison: 11/13/2010

CLINICAL DATA: Fall 1 week ago, flank pain

RIGHT RIBS AND CHEST - 3+ VIEW

[w chest pa]
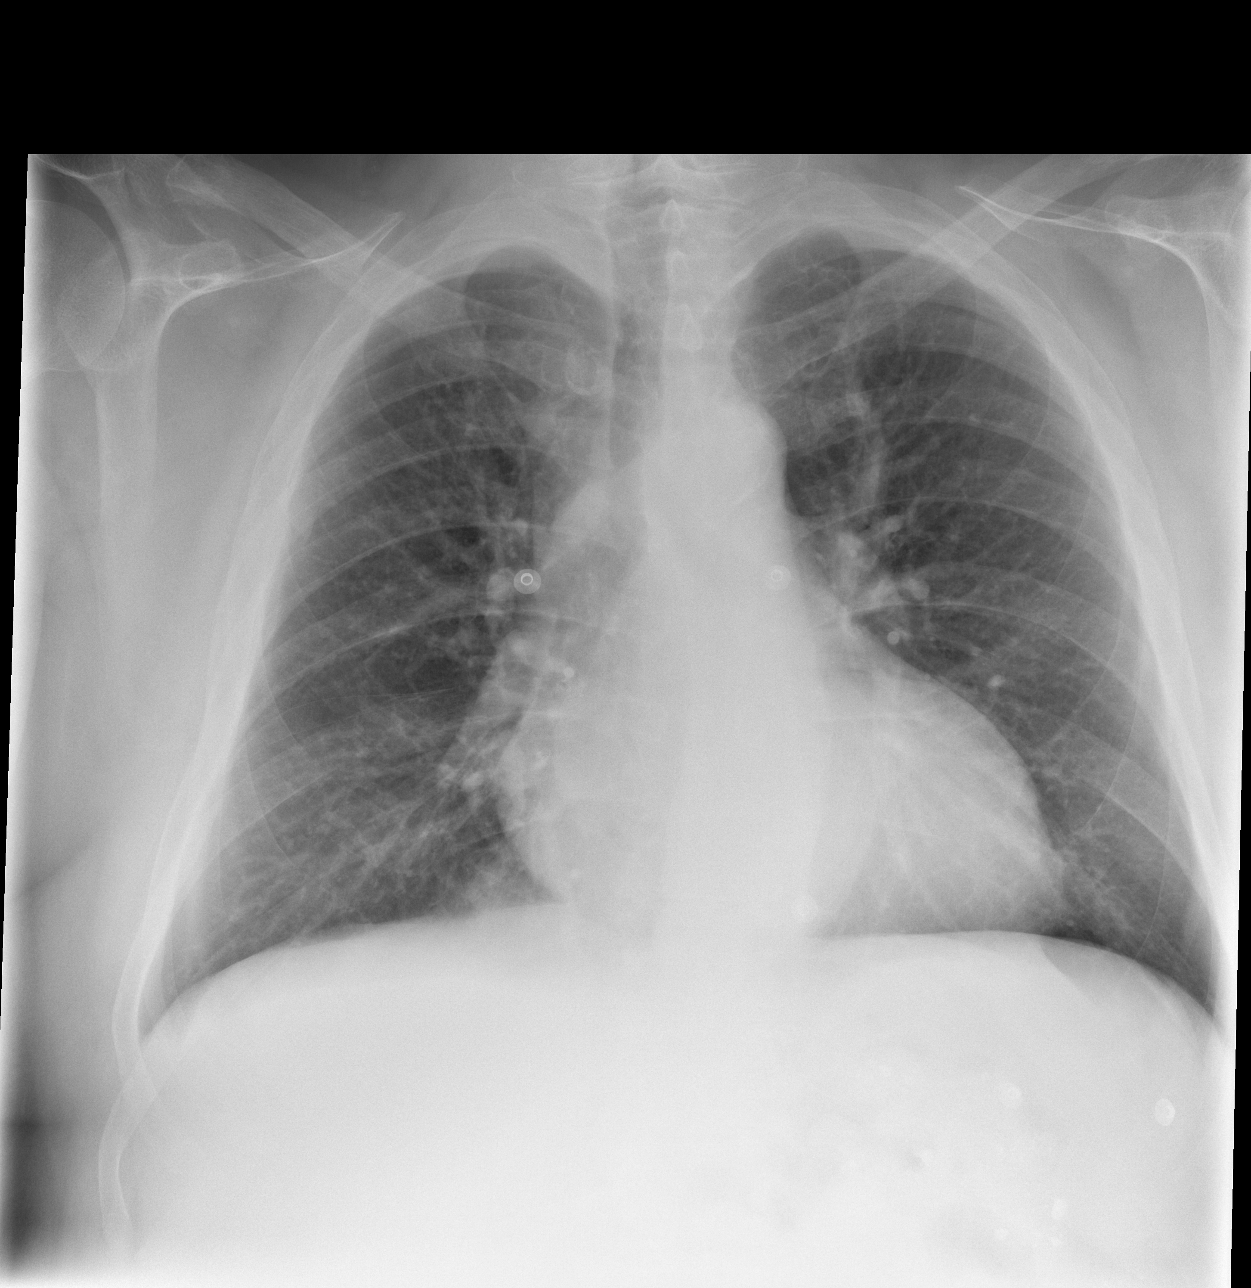

[w ribs ap/pa upper right]
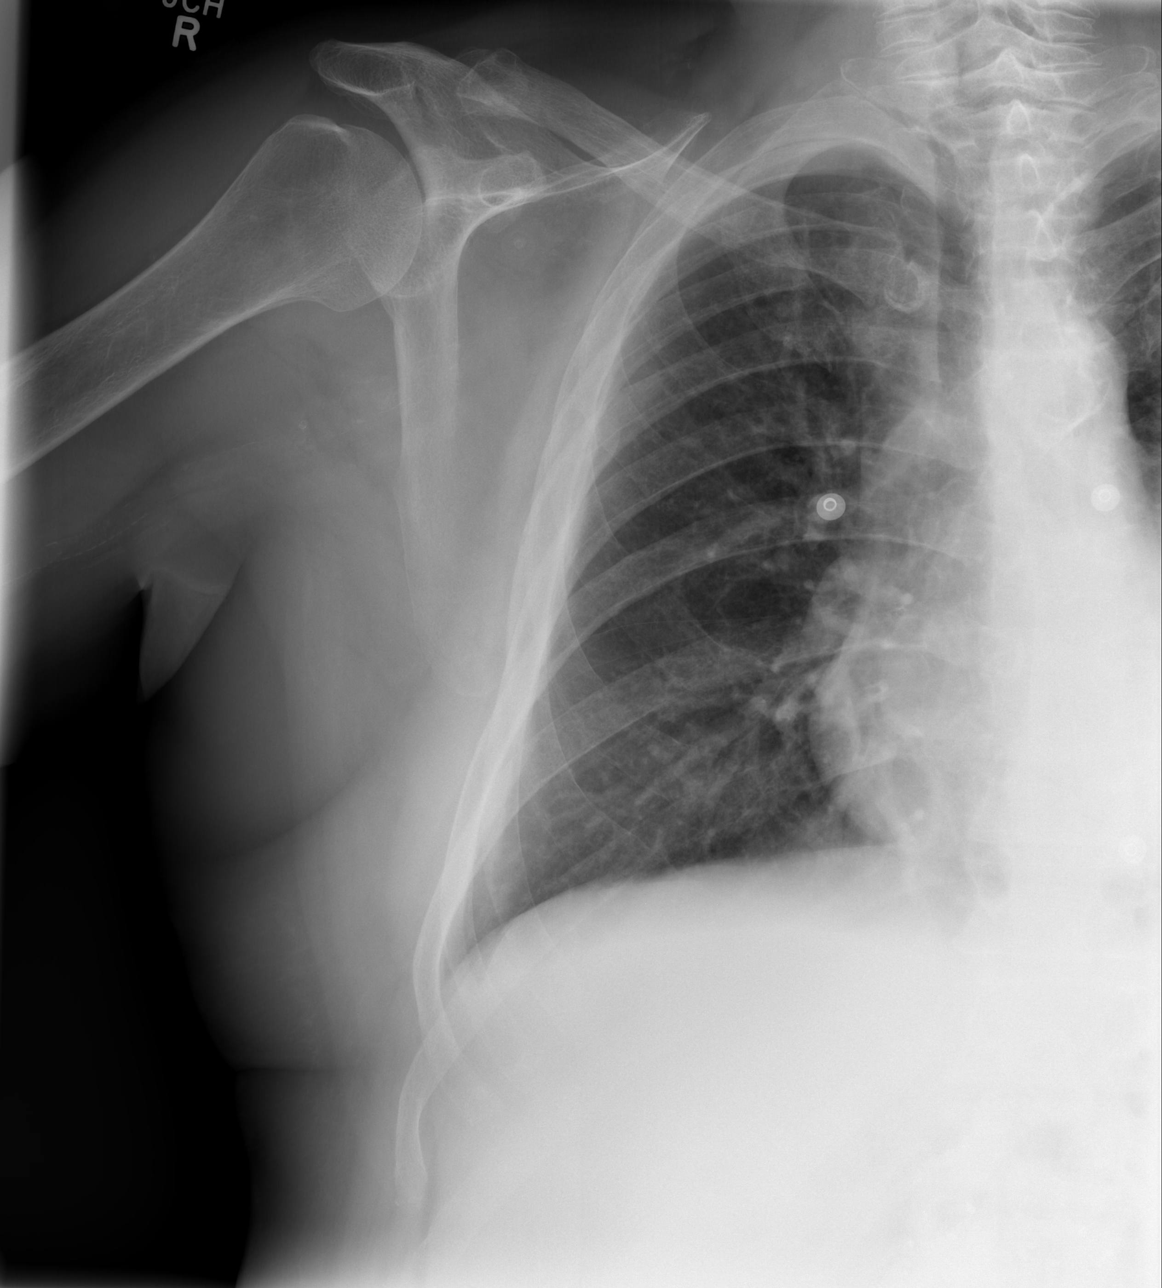

[w ribs oblique right]
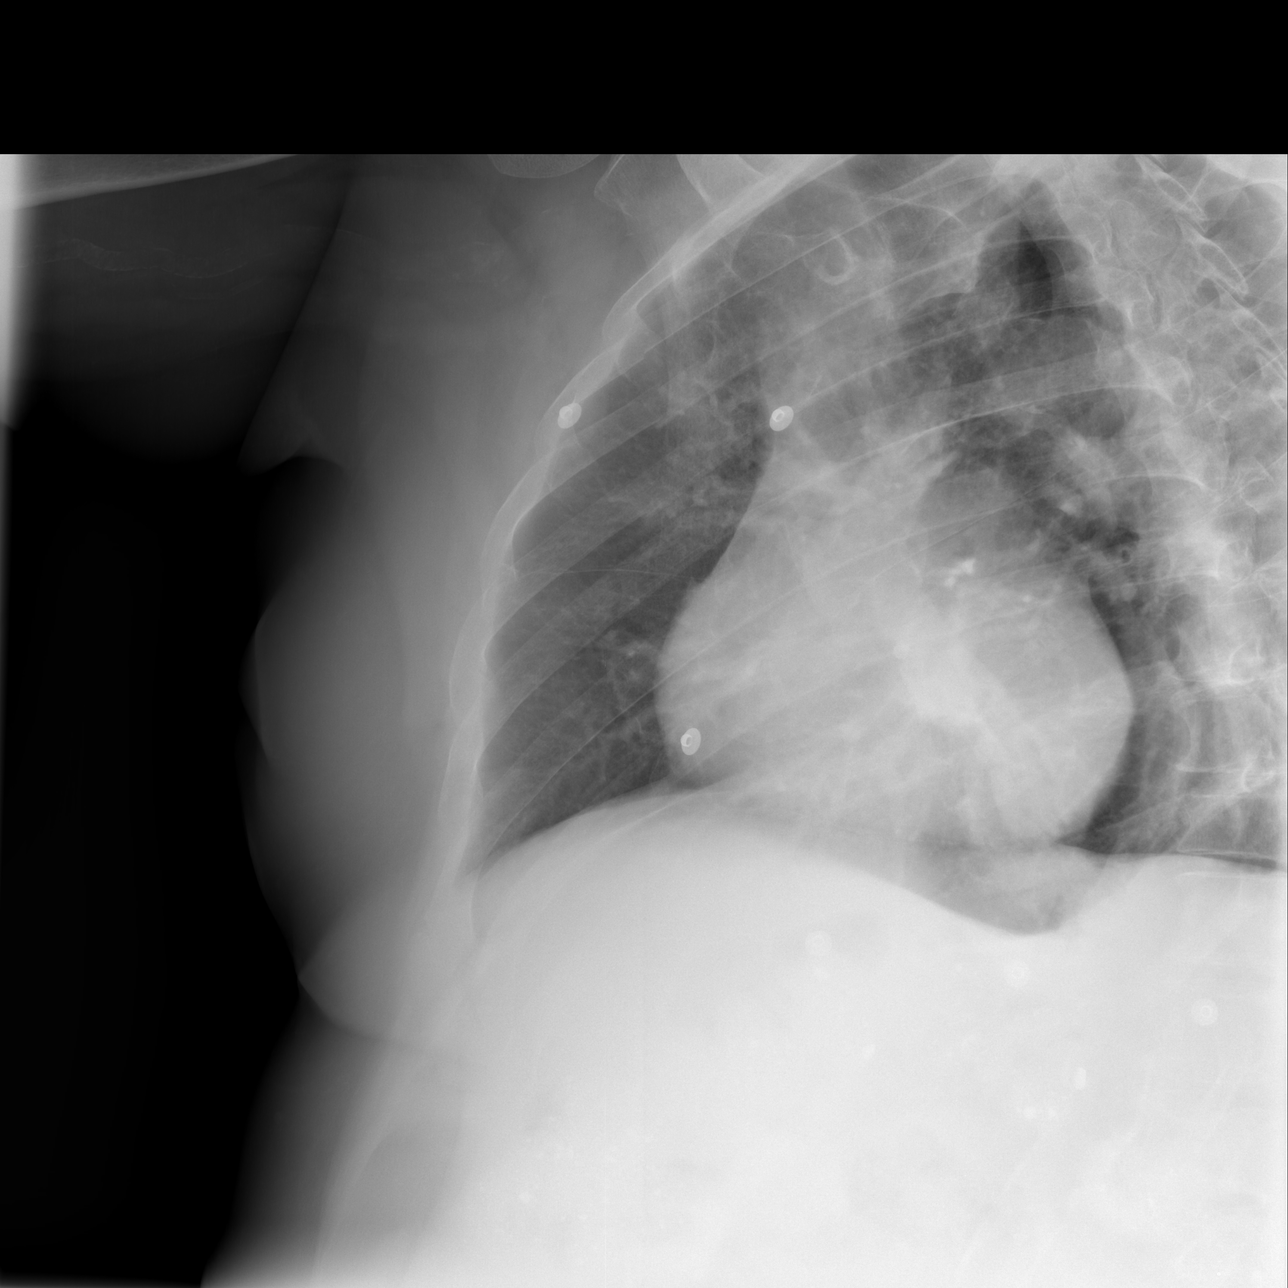

[3 of 3 positions shown; findings below may reference images not displayed]

FINDINGS: Three views right wrist submitted.  No acute infiltrate
or pulmonary edema.

Mild displaced fracture of the right third and fourth ribs.
Minimal displaced fracture of the right fifth rib.  No diagnostic
pneumothorax.
IMPRESSION: Mild displaced fracture of the right third and fourth ribs.
Minimal displaced fracture of the right fifth rib.  No diagnostic
pneumothorax.

## 2015-04-05 IMAGING — CT CT HEAD W/O CM
1 of 2 series · 16 of 30 positions shown, 20 images · non-contrast
Comparison: 08/26/2011

CLINICAL DATA: Fall off tractor.  Head injury.  Confusion.
Diabetes and renal failure.

CT HEAD WITHOUT CONTRAST
TECHNIQUE: Contiguous axial images were obtained from the base of
the skull through the vertex without contrast.

[Series 3: recon 2: brain · axial · 0.47mm/px · z∈[+101,+159]mm · 16 of 24 slices shown, 20 images]
[im 2/24  brain]
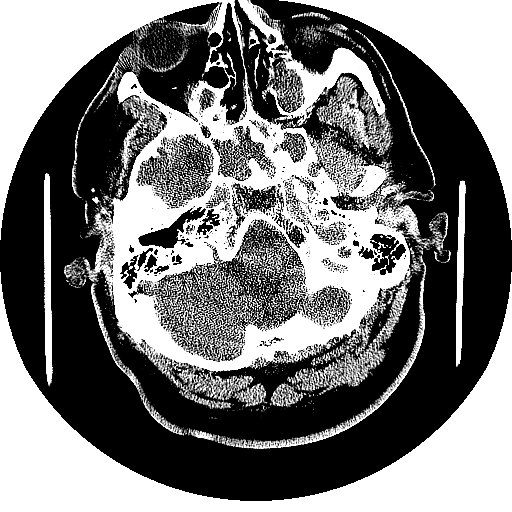
[im 2/24  bone]
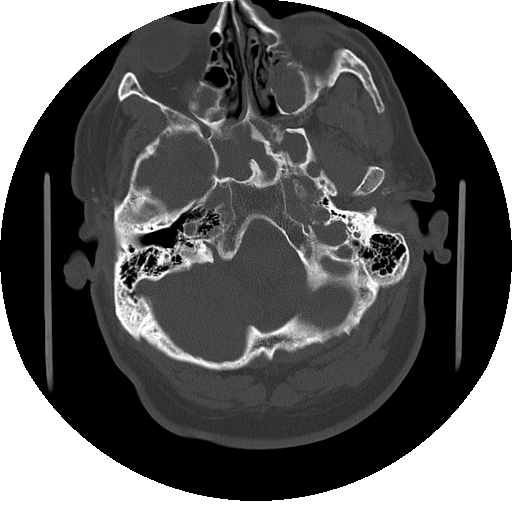
[im 3/24  brain]
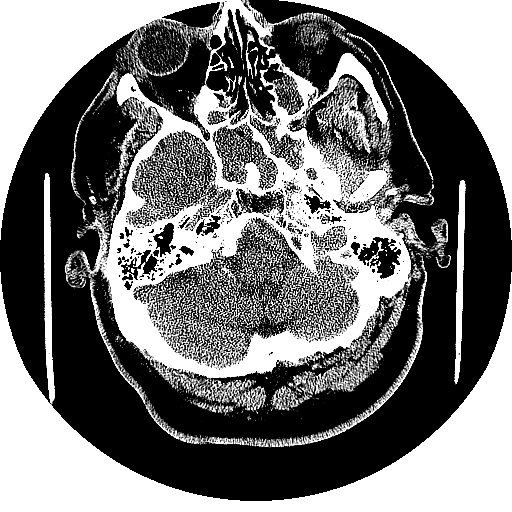
[im 4/24  brain]
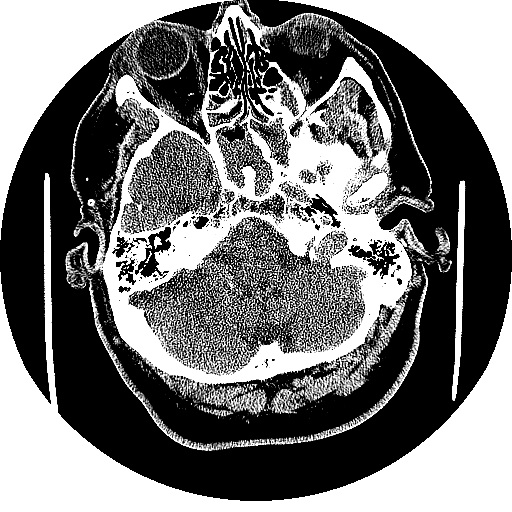
[im 5/24  brain]
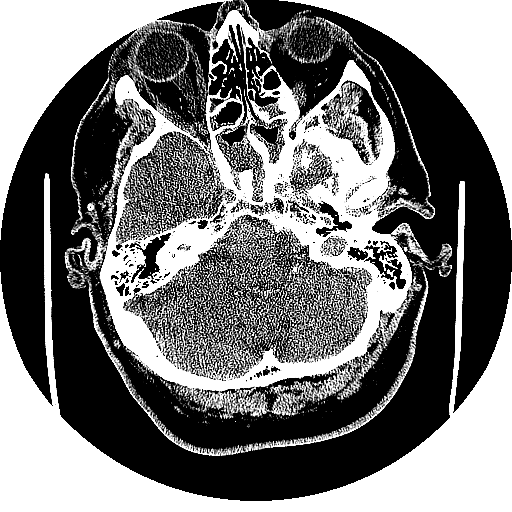
[im 8/24  brain]
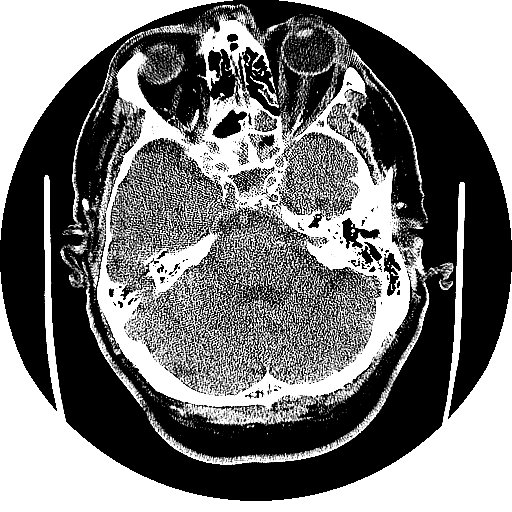
[im 8/24  bone]
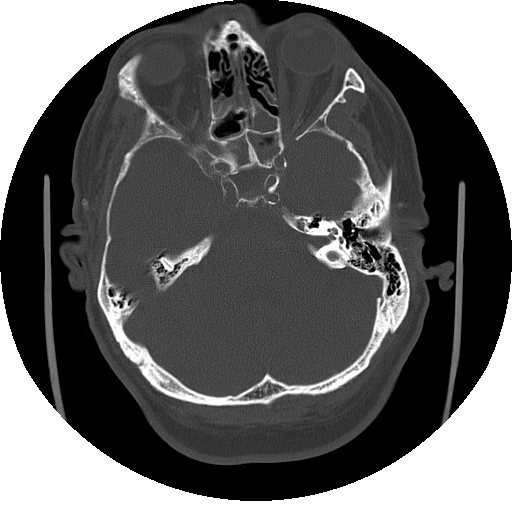
[im 9/24  brain]
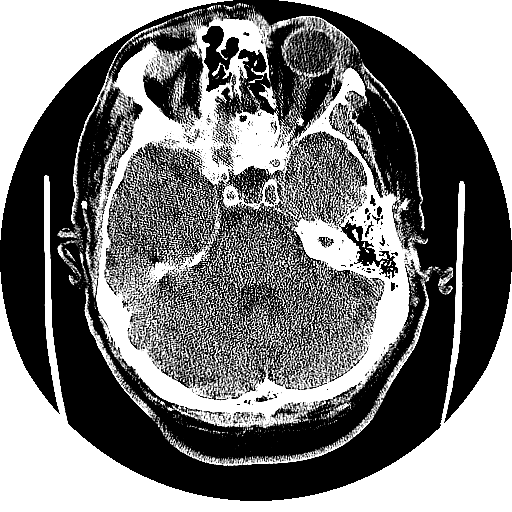
[im 10/24  brain]
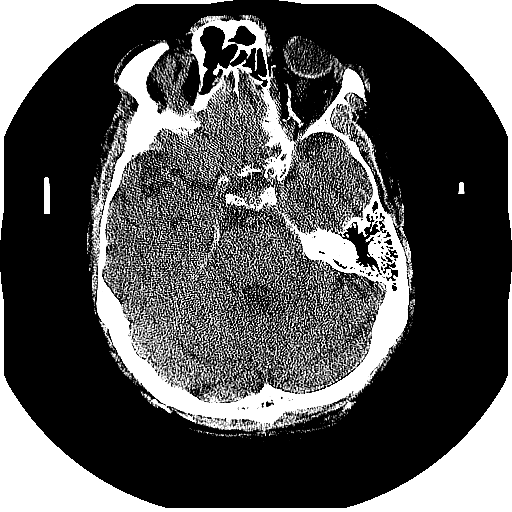
[im 11/24  brain]
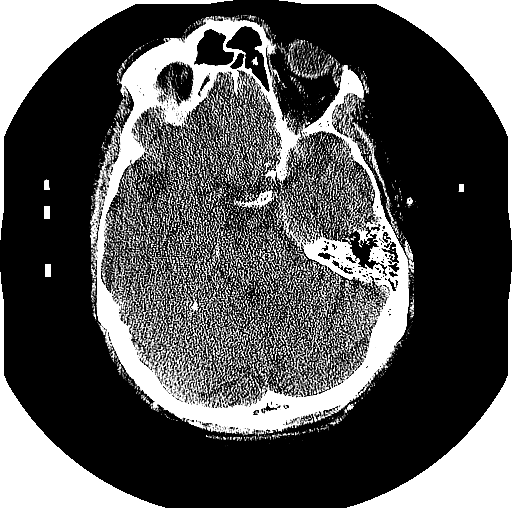
[im 13/24  brain]
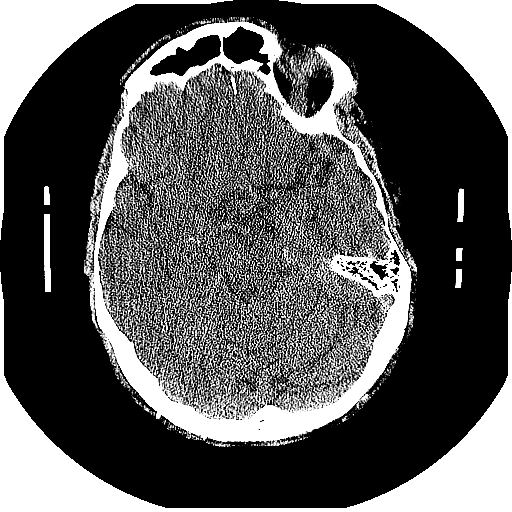
[im 13/24  bone]
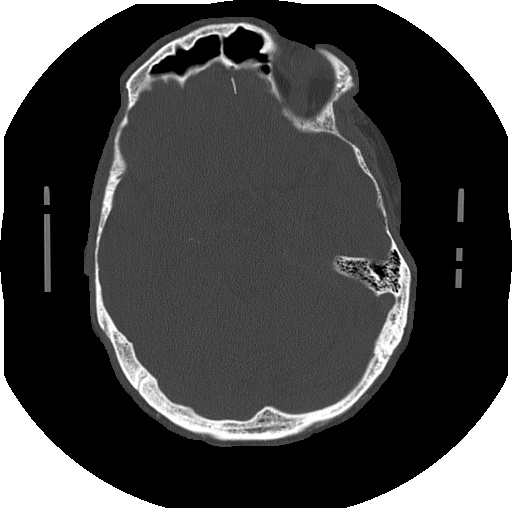
[im 14/24  brain]
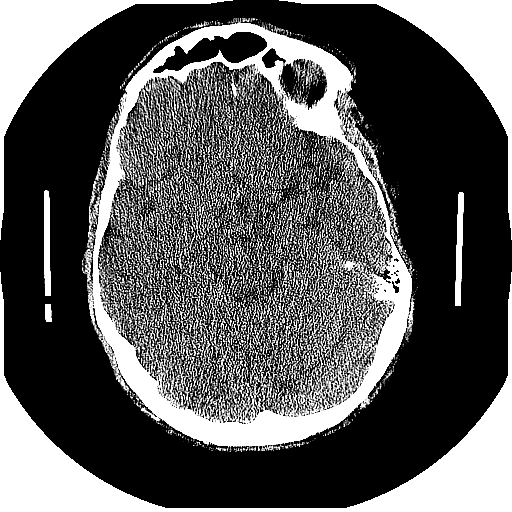
[im 15/24  brain]
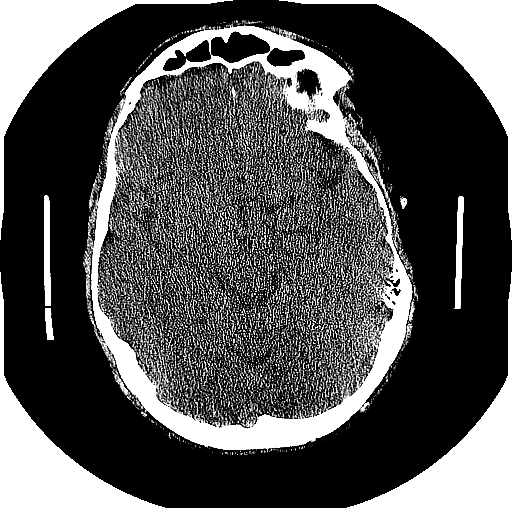
[im 16/24  brain]
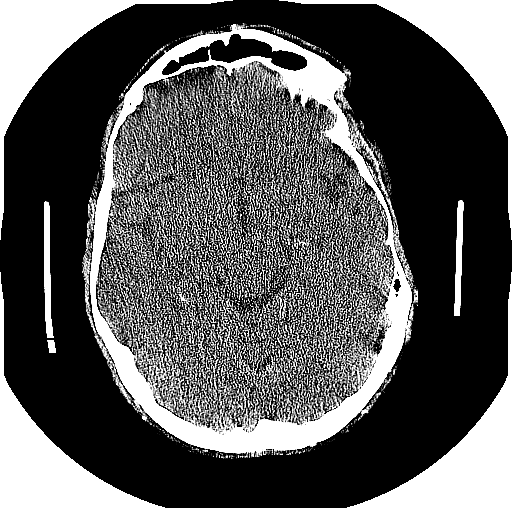
[im 19/24  brain]
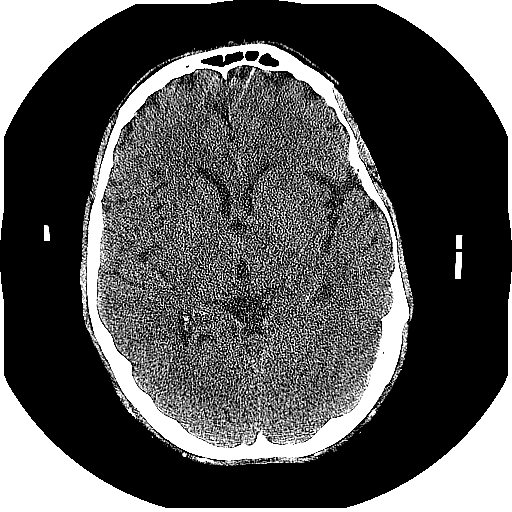
[im 19/24  bone]
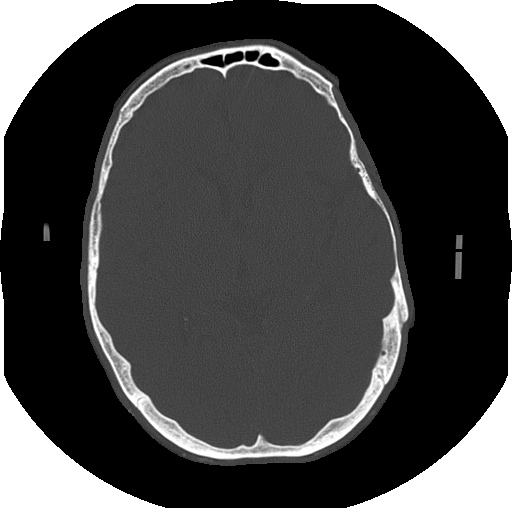
[im 20/24  brain]
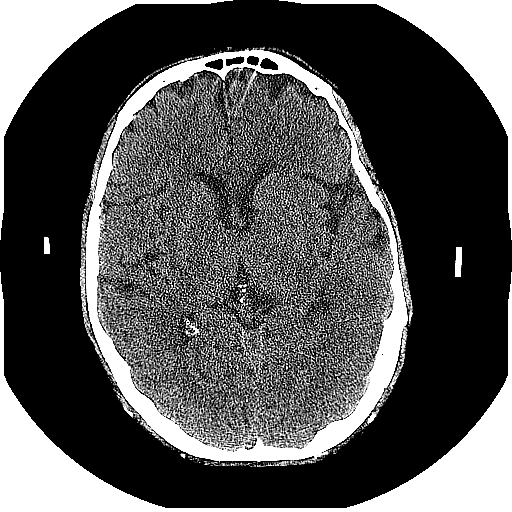
[im 21/24  brain]
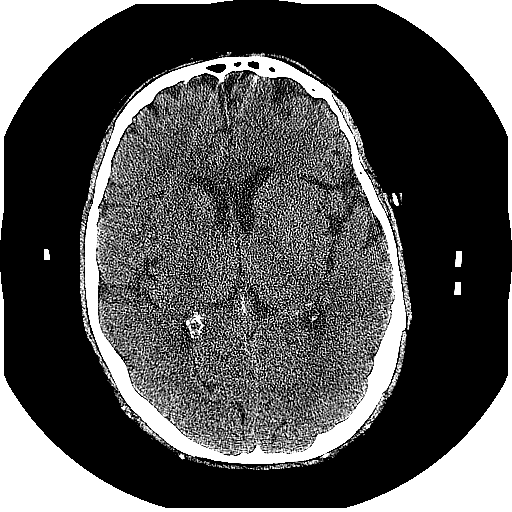
[im 22/24  brain]
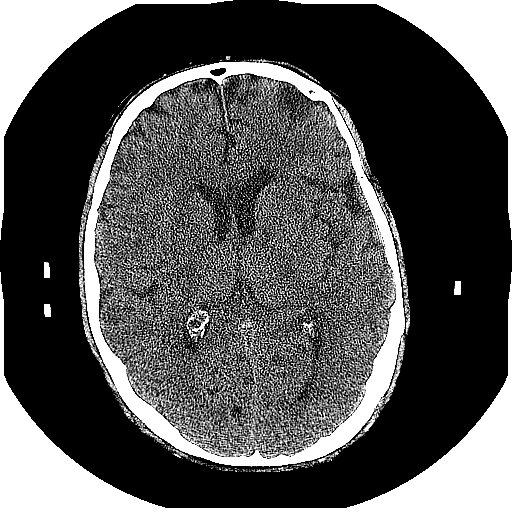

[16 of 30 positions shown; findings below may reference images not displayed]

FINDINGS: There is no evidence of intracranial hemorrhage, brain
edema or other signs of acute infarction.  There is no evidence of
intracranial mass lesion or mass effect.  No abnormal extra-axial
fluid collections are identified.

Ventricles are normal in size.  Mild chronic small vessel disease
is stable in appearance.  No evidence of skull fracture or
pneumocephalus.  Chronic pansinusitis again noted.
IMPRESSION: 1.  No acute intracranial findings.
2.  Stable mild chronic small vessel disease.
3.  Chronic pansinusitis.
